# Patient Record
Sex: Female | Born: 1935 | Race: White | Hispanic: No | Smoking: Never smoker
Health system: Southern US, Community
[De-identification: ages and names within clinical notes are randomized; demographics above are authoritative.]

## PROBLEM LIST (undated history)

## (undated) DIAGNOSIS — Z8781 Personal history of (healed) traumatic fracture: Secondary | ICD-10-CM

## (undated) DIAGNOSIS — Z8489 Family history of other specified conditions: Secondary | ICD-10-CM

## (undated) DIAGNOSIS — D649 Anemia, unspecified: Secondary | ICD-10-CM

## (undated) DIAGNOSIS — Z5189 Encounter for other specified aftercare: Secondary | ICD-10-CM

## (undated) DIAGNOSIS — K219 Gastro-esophageal reflux disease without esophagitis: Secondary | ICD-10-CM

## (undated) DIAGNOSIS — G4700 Insomnia, unspecified: Secondary | ICD-10-CM

## (undated) DIAGNOSIS — D126 Benign neoplasm of colon, unspecified: Secondary | ICD-10-CM

## (undated) DIAGNOSIS — R12 Heartburn: Secondary | ICD-10-CM

## (undated) DIAGNOSIS — E785 Hyperlipidemia, unspecified: Secondary | ICD-10-CM

## (undated) DIAGNOSIS — E7800 Pure hypercholesterolemia, unspecified: Secondary | ICD-10-CM

## (undated) DIAGNOSIS — J449 Chronic obstructive pulmonary disease, unspecified: Secondary | ICD-10-CM

## (undated) DIAGNOSIS — F419 Anxiety disorder, unspecified: Secondary | ICD-10-CM

## (undated) DIAGNOSIS — M1990 Unspecified osteoarthritis, unspecified site: Secondary | ICD-10-CM

## (undated) DIAGNOSIS — I10 Essential (primary) hypertension: Secondary | ICD-10-CM

## (undated) DIAGNOSIS — L719 Rosacea, unspecified: Secondary | ICD-10-CM

## (undated) DIAGNOSIS — K5900 Constipation, unspecified: Secondary | ICD-10-CM

## (undated) DIAGNOSIS — R7611 Nonspecific reaction to tuberculin skin test without active tuberculosis: Secondary | ICD-10-CM

## (undated) DIAGNOSIS — Z8619 Personal history of other infectious and parasitic diseases: Secondary | ICD-10-CM

## (undated) DIAGNOSIS — K635 Polyp of colon: Secondary | ICD-10-CM

## (undated) HISTORY — DX: Polyp of colon: K63.5

## (undated) HISTORY — PX: JOINT REPLACEMENT: SHX530

## (undated) HISTORY — DX: Personal history of other infectious and parasitic diseases: Z86.19

## (undated) HISTORY — PX: LUMBAR FUSION: SHX111

## (undated) HISTORY — DX: Hyperlipidemia, unspecified: E78.5

## (undated) HISTORY — PX: WRIST SURGERY: SHX841

## (undated) HISTORY — DX: Encounter for other specified aftercare: Z51.89

## (undated) HISTORY — PX: COLONOSCOPY: SHX174

## (undated) HISTORY — DX: Essential (primary) hypertension: I10

## (undated) HISTORY — DX: Nonspecific reaction to tuberculin skin test without active tuberculosis: R76.11

## (undated) HISTORY — PX: HIP ARTHROPLASTY: SHX981

## (undated) HISTORY — PX: FRACTURE SURGERY: SHX138

## (undated) HISTORY — PX: VERTEBROPLASTY: SHX113

## (undated) HISTORY — DX: Rosacea, unspecified: L71.9

## (undated) HISTORY — PX: TONSILLECTOMY: SUR1361

## (undated) HISTORY — PX: REPLACEMENT TOTAL KNEE BILATERAL: SUR1225

## (undated) HISTORY — PX: POLYPECTOMY: SHX149

## (undated) HISTORY — DX: Benign neoplasm of colon, unspecified: D12.6

---
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
## 2006-12-18 DIAGNOSIS — D126 Benign neoplasm of colon, unspecified: Secondary | ICD-10-CM

## 2006-12-18 HISTORY — DX: Benign neoplasm of colon, unspecified: D12.6

## 2009-02-25 LAB — HM MAMMOGRAPHY: HM Mammogram: NORMAL

## 2010-06-06 ENCOUNTER — Inpatient Hospital Stay (HOSPITAL_COMMUNITY): Admission: RE | Admit: 2010-06-06 | Discharge: 2010-06-08 | Payer: Self-pay | Admitting: Orthopedic Surgery

## 2010-06-11 ENCOUNTER — Emergency Department (HOSPITAL_COMMUNITY): Admission: EM | Admit: 2010-06-11 | Discharge: 2010-06-11 | Payer: Self-pay | Admitting: Emergency Medicine

## 2010-06-11 ENCOUNTER — Emergency Department (HOSPITAL_COMMUNITY): Admission: EM | Admit: 2010-06-11 | Discharge: 2010-06-11 | Payer: Self-pay | Admitting: Family Medicine

## 2010-06-17 DEATH — deceased

## 2011-03-05 LAB — CBC
HCT: 26 % — ABNORMAL LOW (ref 36.0–46.0)
HCT: 30.5 % — ABNORMAL LOW (ref 36.0–46.0)
Hemoglobin: 10.3 g/dL — ABNORMAL LOW (ref 12.0–15.0)
MCHC: 33.8 g/dL (ref 30.0–36.0)
MCHC: 34 g/dL (ref 30.0–36.0)
MCV: 95.9 fL (ref 78.0–100.0)
MCV: 96.4 fL (ref 78.0–100.0)
Platelets: 190 10*3/uL (ref 150–400)
RBC: 3.18 MIL/uL — ABNORMAL LOW (ref 3.87–5.11)
RDW: 13.8 % (ref 11.5–15.5)

## 2011-03-05 LAB — URINE MICROSCOPIC-ADD ON

## 2011-03-05 LAB — URINALYSIS, ROUTINE W REFLEX MICROSCOPIC
Bilirubin Urine: NEGATIVE
Nitrite: NEGATIVE
Protein, ur: NEGATIVE mg/dL
Specific Gravity, Urine: 1.007 (ref 1.005–1.030)
pH: 6.5 (ref 5.0–8.0)

## 2011-03-05 LAB — POCT URINALYSIS DIP (DEVICE)
Ketones, ur: 40 mg/dL — AB
Protein, ur: NEGATIVE mg/dL
Urobilinogen, UA: 0.2 mg/dL (ref 0.0–1.0)
pH: 7.5 (ref 5.0–8.0)

## 2011-03-05 LAB — BASIC METABOLIC PANEL
CO2: 29 mEq/L (ref 19–32)
Calcium: 8.7 mg/dL (ref 8.4–10.5)
Chloride: 103 mEq/L (ref 96–112)
Creatinine, Ser: 0.65 mg/dL (ref 0.4–1.2)
GFR calc Af Amer: >60 mL/min (ref >60)
Sodium: 136 mEq/L (ref 135–145)
Sodium: 137 mEq/L (ref 135–145)

## 2011-03-06 LAB — APTT: aPTT: 28 seconds (ref 24–37)

## 2011-03-06 LAB — DIFFERENTIAL
Eosinophils Relative: 6 % — ABNORMAL HIGH (ref 0–5)
Lymphocytes Relative: 23 % (ref 12–46)
Lymphs Abs: 2.2 10*3/uL (ref 0.7–4.0)
Monocytes Absolute: 1 10*3/uL (ref 0.1–1.0)
Neutro Abs: 5.6 10*3/uL (ref 1.7–7.7)

## 2011-03-06 LAB — PROTIME-INR: Prothrombin Time: 13.7 seconds (ref 11.6–15.2)

## 2011-03-06 LAB — COMPREHENSIVE METABOLIC PANEL
Albumin: 4.3 g/dL (ref 3.5–5.2)
BUN: 16 mg/dL (ref 6–23)
Calcium: 9.6 mg/dL (ref 8.4–10.5)
Creatinine, Ser: 0.78 mg/dL (ref 0.4–1.2)
GFR calc Af Amer: >60 mL/min (ref >60)
GFR calc non Af Amer: >60 mL/min (ref >60)
Potassium: 3.6 mEq/L (ref 3.5–5.1)
Total Bilirubin: 0.6 mg/dL (ref 0.3–1.2)
Total Protein: 7 g/dL (ref 6.0–8.3)

## 2011-03-06 LAB — URINALYSIS, ROUTINE W REFLEX MICROSCOPIC
Bilirubin Urine: NEGATIVE
Glucose, UA: NEGATIVE mg/dL
Protein, ur: NEGATIVE mg/dL
Specific Gravity, Urine: 1.02 (ref 1.005–1.030)
Urobilinogen, UA: 0.2 mg/dL (ref 0.0–1.0)

## 2011-03-06 LAB — URINE CULTURE: Culture: NO GROWTH

## 2011-03-06 LAB — CROSSMATCH: ABO/RH(D): O POS

## 2011-03-06 LAB — CBC
HCT: 40 % (ref 36.0–46.0)
MCV: 96.4 fL (ref 78.0–100.0)
Platelets: 272 10*3/uL (ref 150–400)
WBC: 9.3 10*3/uL (ref 4.0–10.5)

## 2011-03-19 DIAGNOSIS — IMO0001 Reserved for inherently not codable concepts without codable children: Secondary | ICD-10-CM

## 2011-03-19 DIAGNOSIS — Z5189 Encounter for other specified aftercare: Secondary | ICD-10-CM

## 2011-03-19 HISTORY — DX: Encounter for other specified aftercare: Z51.89

## 2011-03-19 HISTORY — DX: Reserved for inherently not codable concepts without codable children: IMO0001

## 2011-04-06 ENCOUNTER — Other Ambulatory Visit (HOSPITAL_COMMUNITY): Payer: Self-pay | Admitting: Orthopedic Surgery

## 2011-04-06 ENCOUNTER — Ambulatory Visit (HOSPITAL_COMMUNITY)
Admission: RE | Admit: 2011-04-06 | Discharge: 2011-04-06 | Disposition: A | Discharge status: Home / Self Care | Payer: Medicare Other | Source: Ambulatory Visit | Attending: Orthopedic Surgery | Admitting: Orthopedic Surgery

## 2011-04-06 ENCOUNTER — Encounter (HOSPITAL_COMMUNITY)
Admission: RE | Admit: 2011-04-06 | Discharge: 2011-04-06 | Disposition: A | Discharge status: Home / Self Care | Payer: Medicare Other | Source: Ambulatory Visit | Attending: Orthopedic Surgery | Admitting: Orthopedic Surgery

## 2011-04-06 DIAGNOSIS — M1711 Unilateral primary osteoarthritis, right knee: Secondary | ICD-10-CM

## 2011-04-06 DIAGNOSIS — Z01818 Encounter for other preprocedural examination: Secondary | ICD-10-CM | POA: Insufficient documentation

## 2011-04-06 DIAGNOSIS — IMO0002 Reserved for concepts with insufficient information to code with codable children: Secondary | ICD-10-CM | POA: Insufficient documentation

## 2011-04-06 DIAGNOSIS — M1710 Unilateral primary osteoarthritis, unspecified knee: Secondary | ICD-10-CM | POA: Insufficient documentation

## 2011-04-06 DIAGNOSIS — Z01810 Encounter for preprocedural cardiovascular examination: Secondary | ICD-10-CM | POA: Insufficient documentation

## 2011-04-06 DIAGNOSIS — Z01812 Encounter for preprocedural laboratory examination: Secondary | ICD-10-CM | POA: Insufficient documentation

## 2011-04-06 LAB — URINALYSIS, ROUTINE W REFLEX MICROSCOPIC
Glucose, UA: NEGATIVE mg/dL
Ketones, ur: NEGATIVE mg/dL
Protein, ur: NEGATIVE mg/dL
Urobilinogen, UA: 0.2 mg/dL (ref 0.0–1.0)

## 2011-04-06 LAB — COMPREHENSIVE METABOLIC PANEL
Albumin: 4.4 g/dL (ref 3.5–5.2)
Alkaline Phosphatase: 68 U/L (ref 39–117)
BUN: 10 mg/dL (ref 6–23)
Calcium: 10 mg/dL (ref 8.4–10.5)
Creatinine, Ser: 0.74 mg/dL (ref 0.4–1.2)
GFR calc Af Amer: >60 mL/min (ref >60)
GFR calc non Af Amer: >60 mL/min (ref >60)
Total Protein: 7.4 g/dL (ref 6.0–8.3)

## 2011-04-06 LAB — CBC
Hemoglobin: 12.6 g/dL (ref 12.0–15.0)
MCH: 31.8 pg (ref 26.0–34.0)
MCHC: 33 g/dL (ref 30.0–36.0)
RDW: 13.6 % (ref 11.5–15.5)
WBC: 8.5 10*3/uL (ref 4.0–10.5)

## 2011-04-06 LAB — SURGICAL PCR SCREEN: MRSA, PCR: NEGATIVE

## 2011-04-07 LAB — URINE CULTURE
Colony Count: NO GROWTH
Culture  Setup Time: 201204191200

## 2011-04-17 ENCOUNTER — Inpatient Hospital Stay (HOSPITAL_COMMUNITY)
Admission: RE | Admit: 2011-04-17 | Discharge: 2011-04-20 | DRG: 470 | Disposition: A | Discharge status: Skilled Nursing Facility | Payer: Medicare Other | Source: Ambulatory Visit | Attending: Orthopedic Surgery | Admitting: Orthopedic Surgery

## 2011-04-17 DIAGNOSIS — D62 Acute posthemorrhagic anemia: Secondary | ICD-10-CM | POA: Diagnosis not present

## 2011-04-17 DIAGNOSIS — N390 Urinary tract infection, site not specified: Secondary | ICD-10-CM | POA: Diagnosis not present

## 2011-04-17 DIAGNOSIS — E7800 Pure hypercholesterolemia, unspecified: Secondary | ICD-10-CM | POA: Diagnosis present

## 2011-04-17 DIAGNOSIS — Z96659 Presence of unspecified artificial knee joint: Secondary | ICD-10-CM

## 2011-04-17 DIAGNOSIS — M1710 Unilateral primary osteoarthritis, unspecified knee: Principal | ICD-10-CM | POA: Diagnosis present

## 2011-04-17 DIAGNOSIS — R339 Retention of urine, unspecified: Secondary | ICD-10-CM | POA: Diagnosis not present

## 2011-04-17 DIAGNOSIS — K5900 Constipation, unspecified: Secondary | ICD-10-CM | POA: Diagnosis not present

## 2011-04-17 DIAGNOSIS — I10 Essential (primary) hypertension: Secondary | ICD-10-CM | POA: Diagnosis present

## 2011-04-17 DIAGNOSIS — IMO0002 Reserved for concepts with insufficient information to code with codable children: Principal | ICD-10-CM | POA: Diagnosis present

## 2011-04-18 LAB — BASIC METABOLIC PANEL
Calcium: 8.6 mg/dL (ref 8.4–10.5)
GFR calc Af Amer: >60 mL/min (ref >60)
GFR calc non Af Amer: >60 mL/min (ref >60)
Potassium: 3.3 mEq/L — ABNORMAL LOW (ref 3.5–5.1)
Sodium: 136 mEq/L (ref 135–145)

## 2011-04-18 LAB — CBC
HCT: 26.8 % — ABNORMAL LOW (ref 36.0–46.0)
MCHC: 33.6 g/dL (ref 30.0–36.0)
Platelets: 222 10*3/uL (ref 150–400)
RDW: 13.4 % (ref 11.5–15.5)
WBC: 11.5 10*3/uL — ABNORMAL HIGH (ref 4.0–10.5)

## 2011-04-19 LAB — BASIC METABOLIC PANEL
Chloride: 98 mEq/L (ref 96–112)
Creatinine, Ser: 0.6 mg/dL (ref 0.4–1.2)
GFR calc Af Amer: >60 mL/min (ref >60)
Sodium: 133 mEq/L — ABNORMAL LOW (ref 135–145)

## 2011-04-19 LAB — URINALYSIS, ROUTINE W REFLEX MICROSCOPIC
Bilirubin Urine: NEGATIVE
Nitrite: NEGATIVE
Specific Gravity, Urine: 1.012 (ref 1.005–1.030)
Urobilinogen, UA: 0.2 mg/dL (ref 0.0–1.0)
pH: 6 (ref 5.0–8.0)

## 2011-04-19 LAB — PREPARE RBC (CROSSMATCH)

## 2011-04-19 LAB — CBC
Hemoglobin: 7.3 g/dL — ABNORMAL LOW (ref 12.0–15.0)
MCH: 32.4 pg (ref 26.0–34.0)
Platelets: 202 10*3/uL (ref 150–400)
RBC: 2.25 MIL/uL — ABNORMAL LOW (ref 3.87–5.11)
WBC: 15.5 10*3/uL — ABNORMAL HIGH (ref 4.0–10.5)

## 2011-04-20 LAB — TYPE AND SCREEN
ABO/RH(D): O POS
Unit division: 0

## 2011-04-20 LAB — CBC
HCT: 26.5 % — ABNORMAL LOW (ref 36.0–46.0)
Platelets: 170 10*3/uL (ref 150–400)
RBC: 2.91 MIL/uL — ABNORMAL LOW (ref 3.87–5.11)
RDW: 16.1 % — ABNORMAL HIGH (ref 11.5–15.5)
WBC: 11.8 10*3/uL — ABNORMAL HIGH (ref 4.0–10.5)

## 2011-04-20 LAB — BASIC METABOLIC PANEL
Chloride: 102 mEq/L (ref 96–112)
Glucose, Bld: 112 mg/dL — ABNORMAL HIGH (ref 70–99)
Potassium: 3.2 mEq/L — ABNORMAL LOW (ref 3.5–5.1)
Sodium: 137 mEq/L (ref 135–145)

## 2011-04-26 NOTE — Op Note (Signed)
NAMESKYLAN, Savannah Coleman              ACCOUNT NO.:  [id number]  MEDICAL RECORD NO.:  [id number]           PATIENT TYPE:  I  LOCATION:  5030                         FACILITY:  [facility]  PHYSICIAN:  Mila Homer. Sherlean Foot, M.D. DATE OF BIRTH:  24-Feb-1931  DATE OF PROCEDURE:  04/17/2011 DATE OF DISCHARGE:                              OPERATIVE REPORT   SURGEON:  Mila Homer. Sherlean Foot, MD  ASSISTANT:  Altamese Cabal, PA-C  ANESTHESIA:  General.  PREOPERATIVE DIAGNOSIS:  Right knee osteoarthritis.  POSTOPERATIVE DIAGNOSIS:  Right knee osteoarthritis.  PROCEDURE:  Right total knee arthroplasty.  INDICATIONS FOR PROCEDURE:  The patient is a 77 year old white female with failure of conservative measures for osteoarthritis of the right knee.  Informed consent was obtained.  DESCRIPTION OF PROCEDURE:  The patient was laid supine and administered general anesthesia.  Right leg was prepped and draped in the usual sterile fashion.  The extremity was exsanguinated with the Esmarch tourniquet and inflated to 350 mmHg and set for an hour.  A midline incision was made with a #10 blade.  A new blade was used to make a median parapatellar arthrotomy and perform synovectomy.  Elevated the deep MCL off the medial crest of the tibia, being careful not to go too far distally since this was a valgus knee.  I everted the patella.  It was very worn and only at about 18 mm.  I decided to ream down to 12 mm and drilled through lug holes through the 32-mm template.  This gave me a 20-mm thickness.  I removed the prosthetic trial and went into flexion subluxing the patella laterally.  I used extramedullary system on the tibia to make perpendicular cut and 3 degrees of posterior slope.  I then used intramedullary system on the femur to make a 4-degree valgus cut since this was a valgus knee.  I then marked out the epicondylar axis and measured 3 degrees.  I sized to size E, pinned to 3 degrees external rotation holes,  and made anterior, posterior, and chamfer cuts through the formal cutting block.  I then placed a lamina spreader in the knee and removed the ACL, PCL, and medial/lateral menisci.  I did have to do some lateral pie crusting to balance the knee.  I then placed a 10-mm spacer block and the knee had good flexion/extension gap balance.  I then finished the femur with a size E finishing block, finished the tibia with a size 4 tibial tray drilling keel.  I converted to a gender specific femur having to recut little bit off the posterior femur and this afforded excellent sizing.  I then removed the trial components and copiously irrigated.  I then cemented a E gender specific femur, 4 tibia, 10 insert, 32 patella had good flexion/extension gap balance.  Tracking was excellent.  I then removed all excess cement and allow the cement to harden in extension.  I left a Hemovac coming out superolaterally and deep arthrotomy pain catheter coming out supermedial and superficial to the arthrotomy.  I then let tourniquet down to obtain hemostasis.  I then closed the arthrotomy with figure-of-eight #1  Vicryl sutures, buried 0 Vicryl sutures, subcuticular 3-0 Vicryl stitch, and skin staples.  COMPLICATIONS:  None.  DRAINS:  One Hemovac and one pain catheter.  ESTIMATED BLOOD LOSS:  300 mL.  TOURNIQUET TIME:  46 minutes.          ______________________________ Mila Homer Sherlean Foot, M.D.     SDL/MEDQ  D:  04/17/2011  T:  04/17/2011  Job:  [id number]  Electronically Signed by Georgena Spurling M.D. on 04/26/2011 07:36:22 AM

## 2011-05-02 NOTE — Consult Note (Signed)
  NAMEGWENDALYNN, Savannah Coleman              ACCOUNT NO.:  [id number]  MEDICAL RECORD NO.:  [id number]           PATIENT TYPE:  I  LOCATION:  5030                         FACILITY:  [facility]  PHYSICIAN:  Shenique Childers I. Patsi Sears, M.D.DATE OF BIRTH:  08/19/1931  DATE OF CONSULTATION:  04/19/2011 DATE OF DISCHARGE:                                CONSULTATION   SUBJECTIVE:  Savannah Coleman is a 77 year old female, status post right knee arthroplasty on April 17, 2011, with postop urinary retention treated with intermittent cath.  The patient is status post left knee arthroplasty 1 year ago, with the same problem.  At that time, she was found to have a urinary tract infection, with Morganella, which was treated with Cipro.  In addition, the patient had severe constipation, which was relieved by enemas, and MiraLax.  After resolving that constipation, she voided normally.  Currently, the patient began MiraLax 10 days prior to her surgery, but has developed acute urinary retention postoperatively, as well as constipation.  She has had no BM since prior to surgery.  MEDICATIONS: 1. Amlodipine. 2. Simvastatin. 3. Acetaminophen. 4. Lovenox. 5. MiraLax. 6. Oxycodone. 7. Hydromorphone. 8. Zofran. 9. Acetaminophen. 10.Robaxin.  FAMILY HISTORY:  Significant for arthritis.  SOCIAL HISTORY:  The patient is single.  She has excellent family support.  She drinks 1-2 cups of coffee per day.  ALLERGIES:  CODEINE.  REVIEW OF SYSTEMS:  Noncontributory except for inability to void, and constipation with some nausea and continued pain.  PHYSICAL EXAMINATION:  GENERAL:  A well-developed, well-nourished white female in no acute distress. VITAL SIGNS:  Blood pressure is 142/84, pulse 72, respiratory rate 20. NECK:  Supple, nontender, no nodes. ABDOMEN:  Soft, decreased bowel sounds without organomegaly or masses. BUS is normal.  IMPRESSION:  Urinary retention postoperative knee replacement.  The patient has  associated chronic constipation.  Would advise I and O cath until the patient is able to have bowel movement.  Would also advise aggressive bowel regimen with enemas, and Fleet's Phospho-Soda p.o.  We will add Flomax 0.4 mg one p.o. per day to try to relax the bladder outlet so that she can void.  I believe that she will void normally when her constipation is resolved.     Yachet Mattson I. Patsi Sears, M.D.     SIT/MEDQ  D:  04/19/2011  T:  04/20/2011  Job:  [id number]  cc:   Mila Homer. Sherlean Foot, M.D.  Electronically Signed by Jethro Bolus M.D. on 05/02/2011 05:10:23 PM

## 2011-05-11 NOTE — Discharge Summary (Signed)
Savannah Coleman, Savannah Coleman              ACCOUNT NO.:  [id number]  MEDICAL RECORD NO.:  [id number]           PATIENT TYPE:  I  LOCATION:  5030                         FACILITY:  [facility]  PHYSICIAN:  Mila Homer. Sherlean Foot, M.D. DATE OF BIRTH:  [birth year]  DATE OF ADMISSION:  04/17/2011 DATE OF DISCHARGE:  04/20/2011                              DISCHARGE SUMMARY   Savannah Coleman is an inpatient at [facility], room 530.  ADMISSION DIAGNOSIS:  Osteoarthritis of the right knee.  DISCHARGE DIAGNOSES: 1. Osteoarthritis of the right knee. 2. Status post right total knee arthroplasty. 3. Acute blood loss anemia status post surgery. 4. Constipation status post surgery.  PROCEDURE:  Right total knee arthroplasty.  HISTORY:  The patient is a 77 year old female complaining of pain in the right knee times several years, now interfering with activities of daily living.  Conservative treatments were tried and failed.  We reviewedrisks and benefits of surgery, and the patient agreed to have a right total knee arthroplasty.  ALLERGIES:  The patient is allergic to VICODIN, states constipation.  ADMISSION MEDICATIONS:  Upon admission to the hospital, the patient was taking: 1. Amlodipine 5 mg one tablet daily. 2. Multivitamins, over the counter, one daily. 3. Simvastatin 20 mg one tablet daily at bedtime.  HOSPITAL COURSE:  This is a 77 year old female admitted on April 17, 2011, after appropriate laboratory studies were obtained preoperatively as well as Ancef on-call to the operating room.  She was taken to the OR where she underwent a right total knee arthroplasty.  The patient tolerated the procedure well and taken to the PACU in good condition. She was placed on Dilaudid IV medication as needed as well as oxycodone 5 mg for pain.  Foley was placed intraoperatively.  On postop day 1, vital signs were stable.  The patient denied chest pain, shortness of breath, or calf pain.  The patient was  started on Lovenox 30 mg subcu q.12 h. at 8:00 a.m.  Consults with PT, OT, and care management were made.  The patient was weightbearing as tolerated.  CPM was 0-90 degrees 6-8 hours per day, incentive spirometry were taught.  Postop day #2, the patient continued to progress with physical therapy. Foley had been discontinued, and the patient last night had 700 mL of urine which was in and out cath and she did continue to go sparingly after that about 50-25 mL at a time.  The patient was also placed on MiraLax for constipation.  Marcaine pump and Hemovac were discontinued. On her last admission for her left total knee, Dr. Patsi Sears with [facility] had been consulted about constipation and UTI.  Dr. Patsi Sears was again consulted by me over the phone just for advise on what he would like to do.  We placed a Foley back in and sent her urine down for culture.  The patient had been treated with Ancef after surgery but to cover UTI since she has a history and we gave her Cipro 500 mg b.i.d. and she was started on Flomax 0.4 as well.  The patient also did have a hemoglobin of 7.7 and was given  2 units of PRBC.  Postop day 3, the patient's blood was much better at 9.2.  After her 2 units, she looked and felt much better.  The patient's Foley was discontinued.  She was continued on her antibiotic as well as her Flomax.  She was discharged to a skilled nursing facility at [facility] on postop day 3 after Lovenox teaching and physical therapy goals were met.  LABORATORY STUDIES:  Upon discharge from the hospital, the patient's vital signs were steady.  She was on room air with sats of 97.  The patient's H and H were 9.2 and 26.5, white blood cell count was 11.8 and platelets were 170.  The patient's sodium was 137, potassium was 3.2, chloride was 102, CO2 was 31, BUN was 11, and creatinine was 0.47, glucose was 112.  DISCHARGE INSTRUCTIONS:  There are no restrictions on diet.  She is  to follow the discharge instruction sheet that was provided and her diet was as you were doing prior to hospitalization.  Increase activity slowly as tolerated.  No lifting or driving for 6 weeks.  The patient may shower without dressing once there is no drainage and wash over the wound.  If drainage remains, cover wound with plastic wrap and then shower.  She was weightbearing as tolerated, stockings are to be used, the TED hose for 3 weeks on both legs, and you may remove them at night for sleeping.  Constipation prevention:  The patient was drinking plenty of fluids, prune juice may be helpful.  You may use a stool softener and may continue use of MiraLax for constipation as needed.  The patient is to follow up.  She is to call for an appointment to be seen on May 02, 2011, at 8:30 in the morning in [facility], the number is [phone].  The patient may change dressing on Friday, then change the dressing daily with sterile 4x4 gauze dressing, and apply the TED hose.  CPM is to be used 0-90 degrees 6-8 hours a day x2 weeks.  The patient is to continue Lovenox for 11 days as well.  Upon discharge from the hospital, the patient was given prescriptions for Robaxin 500 mg one to two tablets every 6-8 hours as needed for spasm, oxycodone 500 mg one to two tablets every 4-6 hours as needed for pain, Cipro 500 mg one b.i.d. for 3 days, Flomax 0.4 mg one daily at night x1 week, and Lovenox 40 mg one injection daily x11 days.  The patient is to follow up with Dr. Sherlean Foot on May 02, 2011, call for appointment at [phone].  The patient is discharged in improved condition.    ______________________________ Altamese Cabal, PA-C   ______________________________ Mila Homer. Sherlean Foot, M.D.   MJ/MEDQ  D:  04/20/2011  T:  04/20/2011  Job:  [id number]  Electronically Signed by Altamese Cabal PA-C on 05/09/2011 11:34:28 AM Electronically Signed by Georgena Spurling M.D. on 05/11/2011 10:39:49 PM

## 2012-03-22 ENCOUNTER — Encounter: Payer: Self-pay | Admitting: Internal Medicine

## 2012-04-23 ENCOUNTER — Encounter: Payer: Self-pay | Admitting: Internal Medicine

## 2012-04-23 ENCOUNTER — Ambulatory Visit (AMBULATORY_SURGERY_CENTER): Payer: Medicare Other | Admitting: *Deleted

## 2012-04-23 ENCOUNTER — Telehealth: Payer: Self-pay | Admitting: *Deleted

## 2012-04-23 VITALS — Ht 64.0 in | Wt 138.0 lb

## 2012-04-23 DIAGNOSIS — Z1211 Encounter for screening for malignant neoplasm of colon: Secondary | ICD-10-CM

## 2012-04-23 MED ORDER — PEG-KCL-NACL-NASULF-NA ASC-C 100 G PO SOLR: ORAL | Status: DC

## 2012-04-23 NOTE — Telephone Encounter (Signed)
Savannah Coleman had a colon at Dr. Truett Perna [facility] office in [place], [place] in 2008.  She said she had 3 Polyps and was to return for a recall colon in 3 years.  She is scheduled for a colon 05/07/12 and I completed her previsit and informed her Dr. Juanda Chance would need to review her colon report and path report and decide if it was time for her to have a colon.  I gave the Medical Release Form to Ronny Bacon CMA.  Wyona Almas

## 2012-04-23 NOTE — Progress Notes (Signed)
Ms. Diantonio had a colon at Dr. Sherrill [facility] office in [place], [place] in 2008.  She said she had 3 Polyps and was to return for a recall colon in 3 years.  She is scheduled for a colon 05/07/12 and I completed her previsit and informed her Dr. Brodie would need to review her colon report and path report and decide if it was time for her to have a colon.  I gave the Medical Release Form to Dottie Smith CMA.  JoAnn Jabar Krysiak  

## 2012-04-23 NOTE — Telephone Encounter (Signed)
Noted  

## 2012-05-07 ENCOUNTER — Ambulatory Visit (AMBULATORY_SURGERY_CENTER): Payer: Medicare Other | Admitting: Internal Medicine

## 2012-05-07 ENCOUNTER — Encounter: Payer: Self-pay | Admitting: Internal Medicine

## 2012-05-07 VITALS — BP 147/91 | HR 71 | Temp 97.6°F | Resp 18 | Ht 64.0 in | Wt 138.0 lb

## 2012-05-07 DIAGNOSIS — D126 Benign neoplasm of colon, unspecified: Secondary | ICD-10-CM

## 2012-05-07 DIAGNOSIS — Z800 Family history of malignant neoplasm of digestive organs: Secondary | ICD-10-CM

## 2012-05-07 DIAGNOSIS — Z86010 Personal history of colonic polyps: Secondary | ICD-10-CM

## 2012-05-07 DIAGNOSIS — Z1211 Encounter for screening for malignant neoplasm of colon: Secondary | ICD-10-CM

## 2012-05-07 MED ORDER — SODIUM CHLORIDE 0.9 % IV SOLN: 500.0000 mL | INTRAVENOUS | Status: DC

## 2012-05-07 NOTE — Progress Notes (Signed)
Patient did not experience any of the following events: a burn prior to discharge; a fall within the facility; wrong site/side/patient/procedure/implant event; or a hospital transfer or hospital admission upon discharge from the facility. (G8907) Patient did not have preoperative order for IV antibiotic SSI prophylaxis. (G8918)  

## 2012-05-07 NOTE — Op Note (Signed)
[facility] [address] [place], [place]  [address]  COLONOSCOPY PROCEDURE REPORT  PATIENT:  Savannah, Coleman  MR#:  [id number] BIRTHDATE:  04-01-1931, 80 yrs. old  GENDER:  female ENDOSCOPIST:  Hedwig Morton. Juanda Chance, MD REF. BY:  Birdena Jubilee, M.D. PROCEDURE DATE:  05/07/2012 PROCEDURE:  Colonoscopy with biopsy and snare polypectomy ASA CLASS:  Class II INDICATIONS:  family history of colon cancer, history of pre-cancerous (adenomatous) colon polyps 2008- adenom. polyp MEDICATIONS:   MAC sedation, administered by CRNA, propofol (Diprivan) 250 mg  DESCRIPTION OF PROCEDURE:   After the risks and benefits and of the procedure were explained, informed consent was obtained. Digital rectal exam was performed and revealed no rectal masses. The LB [id number]-[id number] endoscope was introduced through the anus and advanced to the cecum, which was identified by both the appendix and ileocecal valve.  The quality of the prep was good, using MoviPrep.  The instrument was then slowly withdrawn as the colon was fully examined. <<PROCEDUREIMAGES>>  FINDINGS:  Three polyps were found. at 120 cm 6 mm polyp, at 90 cm 8 mm and 6 mm sessile polyps The polyp was removed using cold biopsy forceps. Polyp was snared, then cauterized with monopolar cautery. Retrieval was successful. snare polyp Polyp was snared without cautery. Retrieval was successful (see image2, image7, image8, image9, and image10). snare polyp  Mild diverticulosis was found in the sigmoid colon (see image1).  This was otherwise a normal examination of the colon (see image3, image4, image6, image5, image13, and image12). inverted diverticulum   Retroflexed views in the rectum revealed no abnormalities.    The scope was then withdrawn from the patient and the procedure completed.  COMPLICATIONS:  None ENDOSCOPIC IMPRESSION: 1) Three polyps 2) Mild diverticulosis in the sigmoid colon 3) Otherwise normal  examination RECOMMENDATIONS: 1) Await pathology results 2) High fiber diet.  REPEAT EXAM:  In 5 year(s) for.  5 year recall only if medically stable  ______________________________ Hedwig Morton. Juanda Chance, MD  CC:  n. eSIGNED:   Hedwig Morton. Maximilian Tallo at 05/07/2012 11:39 AM  Jeannene Patella, [phone]

## 2012-05-07 NOTE — Patient Instructions (Signed)

## 2012-05-08 ENCOUNTER — Telehealth: Payer: Self-pay | Admitting: *Deleted

## 2012-05-08 NOTE — Telephone Encounter (Signed)
  Follow up Call-  Call back number 05/07/2012  Post procedure Call Back phone  # [phone]  Permission to leave phone message Yes     Patient questions:  Do you have a fever, pain , or abdominal swelling? no Pain Score  0 *  Have you tolerated food without any problems? yes  Have you been able to return to your normal activities? yes  Do you have any questions about your discharge instructions: Diet   no Medications  no Follow up visit  no  Do you have questions or concerns about your Care? no  Actions: * If pain score is 4 or above: No action needed, pain <4.

## 2012-05-14 ENCOUNTER — Encounter: Payer: Self-pay | Admitting: Internal Medicine

## 2013-02-13 ENCOUNTER — Encounter: Payer: Self-pay | Admitting: Family Medicine

## 2013-02-13 ENCOUNTER — Other Ambulatory Visit: Payer: Self-pay | Admitting: Dermatology

## 2013-02-13 ENCOUNTER — Encounter: Payer: Self-pay | Admitting: *Deleted

## 2013-02-13 DIAGNOSIS — D126 Benign neoplasm of colon, unspecified: Secondary | ICD-10-CM | POA: Insufficient documentation

## 2013-02-13 DIAGNOSIS — I10 Essential (primary) hypertension: Secondary | ICD-10-CM | POA: Insufficient documentation

## 2013-02-13 DIAGNOSIS — L719 Rosacea, unspecified: Secondary | ICD-10-CM | POA: Insufficient documentation

## 2013-02-13 DIAGNOSIS — E785 Hyperlipidemia, unspecified: Secondary | ICD-10-CM | POA: Insufficient documentation

## 2013-02-13 DIAGNOSIS — R7611 Nonspecific reaction to tuberculin skin test without active tuberculosis: Secondary | ICD-10-CM | POA: Insufficient documentation

## 2013-03-08 ENCOUNTER — Other Ambulatory Visit: Payer: Self-pay | Admitting: Family Medicine

## 2013-03-21 ENCOUNTER — Other Ambulatory Visit: Payer: Self-pay | Admitting: Family Medicine

## 2013-03-25 ENCOUNTER — Telehealth: Payer: Self-pay | Admitting: *Deleted

## 2013-03-25 NOTE — Telephone Encounter (Signed)
PT'S PHARMACY HAS TRIED TO SEND A REFILL REQUEST FOR PT . PT IS CALLING AND IS ALMOST COMPLETELY OUT OF RX HAS ONE PILL LEFT. SHE IS COMING IN FOR LABS AND APPT IN A FEW WEEKS.   SHE NEEDS REFILLS ON BOTH RX BLOOD PRESSURE AND CHOLESTEROL MED.   PH[facility]Y [facility] ON [address]  [place]. PLEASE CALL PT IF YOU HAVE ANY FURTHER QUEST.

## 2013-03-31 ENCOUNTER — Other Ambulatory Visit: Payer: Self-pay | Admitting: Family Medicine

## 2013-03-31 ENCOUNTER — Other Ambulatory Visit: Payer: Medicare Other | Admitting: *Deleted

## 2013-03-31 DIAGNOSIS — R5381 Other malaise: Secondary | ICD-10-CM

## 2013-03-31 DIAGNOSIS — I10 Essential (primary) hypertension: Secondary | ICD-10-CM

## 2013-03-31 LAB — COMPLETE METABOLIC PANEL WITH GFR
ALT: 16 U/L (ref 0–35)
AST: 28 U/L (ref 0–37)
Albumin: 4 g/dL (ref 3.5–5.2)
Alkaline Phosphatase: 51 U/L (ref 39–117)
BUN: 12 mg/dL (ref 6–23)
CO2: 29 mEq/L (ref 19–32)
Calcium: 9.7 mg/dL (ref 8.4–10.5)
Chloride: 105 mEq/L (ref 96–112)
Creat: 0.8 mg/dL (ref 0.50–1.10)
GFR, Est African American: 81 mL/min
GFR, Est Non African American: 70 mL/min
Glucose, Bld: 89 mg/dL (ref 70–99)
Potassium: 3.9 mEq/L (ref 3.5–5.3)
Sodium: 143 mEq/L (ref 135–145)
Total Bilirubin: 0.4 mg/dL (ref 0.3–1.2)
Total Protein: 6.7 g/dL (ref 6.0–8.3)

## 2013-03-31 LAB — TSH: TSH: 3.112 u[IU]/mL (ref 0.350–4.500)

## 2013-03-31 MED ORDER — AMLODIPINE BESYLATE 5 MG PO TABS: 5.0000 mg | ORAL_TABLET | Freq: Every day | ORAL | Status: DC

## 2013-03-31 MED ORDER — SIMVASTATIN 80 MG PO TABS: 40.0000 mg | ORAL_TABLET | Freq: Every day | ORAL | Status: DC

## 2013-03-31 NOTE — Telephone Encounter (Signed)
Refilled her norvasc and simvastatin for 30 days.  PT has a follow up for her lab results. PG

## 2013-04-01 ENCOUNTER — Encounter: Payer: Self-pay | Admitting: *Deleted

## 2013-04-01 LAB — MICROALBUMIN, URINE: Microalb, Ur: 1.22 mg/dL (ref 0.00–1.89)

## 2013-04-03 ENCOUNTER — Encounter: Payer: Self-pay | Admitting: Family Medicine

## 2013-04-03 ENCOUNTER — Ambulatory Visit (INDEPENDENT_AMBULATORY_CARE_PROVIDER_SITE_OTHER): Payer: Medicare Other | Admitting: Family Medicine

## 2013-04-03 VITALS — BP 150/84 | HR 67 | Ht 63.0 in | Wt 143.0 lb

## 2013-04-03 DIAGNOSIS — I10 Essential (primary) hypertension: Secondary | ICD-10-CM

## 2013-04-03 DIAGNOSIS — Z0000 Encounter for general adult medical examination without abnormal findings: Secondary | ICD-10-CM

## 2013-04-03 DIAGNOSIS — E785 Hyperlipidemia, unspecified: Secondary | ICD-10-CM

## 2013-04-03 LAB — POCT URINALYSIS DIPSTICK
Bilirubin, UA: NEGATIVE
Blood, UA: NEGATIVE
Glucose, UA: NEGATIVE
Ketones, UA: NEGATIVE
Leukocytes, UA: NEGATIVE
Nitrite, UA: NEGATIVE
Protein, UA: NEGATIVE
Spec Grav, UA: 1.02
Urobilinogen, UA: NEGATIVE
pH, UA: 6.5

## 2013-04-03 NOTE — Progress Notes (Signed)
Subjective:     Patient ID: Savannah Coleman, female   DOB: [birth year], 79 y.o.   MRN: [id number]  HPI Katori is here today for her annual CPE with lab results.  She has done well since her last office visit.    1)   Hypertension:  Her blood pressure is elevated today.  She monitors her blood pressure at home and says that it is much lower there.  She needs her amlodipine refilled.   2)  Hyperlipidemia:  She has done well on her simvastatin and needs a refill on it.   Review of Systems  Constitutional: Negative for activity change, fatigue and unexpected weight change.  HENT: Negative for ear pain, congestion, trouble swallowing, neck pain and neck stiffness.   Eyes: Negative for pain.  Respiratory: Negative for cough, chest tightness and shortness of breath.   Cardiovascular: Negative for chest pain and palpitations.  Gastrointestinal: Negative for abdominal pain, diarrhea and constipation.  Genitourinary: Negative for dysuria, frequency, vaginal pain and pelvic pain.  Musculoskeletal: Negative for myalgias, joint swelling and arthralgias.  Skin: Negative for rash.  Neurological: Negative for dizziness, weakness and light-headedness.  Psychiatric/Behavioral: Negative for sleep disturbance. The patient is not nervous/anxious.    Past Medical History  Diagnosis Date  . Hyperlipidemia   . Blood transfusion 03/2011  . Adenomatous colon polyp 2008  . Hypertension   . History of shingles   . Rosacea   . Colon polyps   . 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   Past Surgical History  Procedure Laterality Date  . Appendectomy  1958  . Cholecystectomy  1958  . Replacement total knee bilateral  2011 & 2012  . Colonoscopy    . Polypectomy     Family History  Problem Relation Age of Onset  . Colon cancer Neg Hx   . Stomach cancer Neg Hx   . COPD Father   . Asthma Father   . Asthma Sister        Objective:   Physical Exam  Constitutional: She appears well-nourished. No distress.  HENT:  Head: Normocephalic.  Mouth/Throat: Oropharynx is clear and moist.  Eyes: Conjunctivae and EOM are normal. Pupils are equal, round, and reactive to light. Right eye exhibits no discharge. Left eye exhibits no discharge. No scleral icterus.  Neck: Normal range of motion. Neck supple. No JVD present. No thyromegaly present.  Cardiovascular: Normal rate, regular rhythm and normal heart sounds.   Lymphadenopathy:    She has no cervical adenopathy.       Assessment:     Well Woman Exam  Hypertension Hyperlipidemia     Plan:     1)  We discussed preventative issues for her age group. She is up to date on her colonoscopy. She has had a Pneumovax and Tdap but does not want to get a Zostavax.    2)  Refilled her amlodipine and simvastatin.      3)  EKG showed some mild AV block and non-specific ST changes. She is totally asymptomatic for any cardiac issues.  We discussed setting her up with a cardiologist just because she has never had a cardiac evaluation. She would like to hold on this for now.   TIME 60 MINUTES:  MORE THAN 50 % OF TIME WAS INVOLVED IN COUNSELING.

## 2013-04-07 ENCOUNTER — Encounter: Payer: Self-pay | Admitting: *Deleted

## 2013-04-07 ENCOUNTER — Encounter: Payer: Self-pay | Admitting: Family Medicine

## 2013-04-07 DIAGNOSIS — E785 Hyperlipidemia, unspecified: Secondary | ICD-10-CM | POA: Insufficient documentation

## 2013-04-07 DIAGNOSIS — Z0000 Encounter for general adult medical examination without abnormal findings: Secondary | ICD-10-CM | POA: Insufficient documentation

## 2013-04-07 NOTE — Patient Instructions (Addendum)
Preventive Care for Adults, Female A healthy lifestyle and preventive care can promote health and wellness. Preventive health guidelines for women include the following key practices.  A routine yearly physical is a good way to check with your caregiver about your health and preventive screening. It is a chance to share any concerns and updates on your health, and to receive a thorough exam.  Visit your dentist for a routine exam and preventive care every 6 months. Brush your teeth twice a day and floss once a day. Good oral hygiene prevents tooth decay and gum disease.  The frequency of eye exams is based on your age, health, family medical history, use of contact lenses, and other factors. Follow your caregiver's recommendations for frequency of eye exams.  Eat a healthy diet. Foods like vegetables, fruits, whole grains, low-fat dairy products, and lean protein foods contain the nutrients you need without too many calories. Decrease your intake of foods high in solid fats, added sugars, and salt. Eat the right amount of calories for you.Get information about a proper diet from your caregiver, if necessary.  Regular physical exercise is one of the most important things you can do for your health. Most adults should get at least 150 minutes of moderate-intensity exercise (any activity that increases your heart rate and causes you to sweat) each week. In addition, most adults need muscle-strengthening exercises on 2 or more days a week.  Maintain a healthy weight. The body mass index (BMI) is a screening tool to identify possible weight problems. It provides an estimate of body fat based on height and weight. Your caregiver can help determine your BMI, and can help you achieve or maintain a healthy weight.For adults 20 years and older:  A BMI below 18.5 is considered underweight.  A BMI of 18.5 to 24.9 is normal.  A BMI of 25 to 29.9 is considered overweight.  A BMI of 30 and above is  considered obese.  Maintain normal blood lipids and cholesterol levels by exercising and minimizing your intake of saturated fat. Eat a balanced diet with plenty of fruit and vegetables. Blood tests for lipids and cholesterol should begin at age 20 and be repeated every 5 years. If your lipid or cholesterol levels are high, you are over 50, or you are at high risk for heart disease, you may need your cholesterol levels checked more frequently.Ongoing high lipid and cholesterol levels should be treated with medicines if diet and exercise are not effective.  If you smoke, find out from your caregiver how to quit. If you do not use tobacco, do not start.  If you are pregnant, do not drink alcohol. If you are breastfeeding, be very cautious about drinking alcohol. If you are not pregnant and choose to drink alcohol, do not exceed 1 drink per day. One drink is considered to be 12 ounces (355 mL) of beer, 5 ounces (148 mL) of wine, or 1.5 ounces (44 mL) of liquor.  Avoid use of street drugs. Do not share needles with anyone. Ask for help if you need support or instructions about stopping the use of drugs.  High blood pressure causes heart disease and increases the risk of stroke. Your blood pressure should be checked at least every 1 to 2 years. Ongoing high blood pressure should be treated with medicines if weight loss and exercise are not effective.  If you are 55 to 77 years old, ask your caregiver if you should take aspirin to prevent strokes.  Diabetes   screening involves taking a blood sample to check your fasting blood sugar level. This should be done once every 3 years, after age 45, if you are within normal weight and without risk factors for diabetes. Testing should be considered at a younger age or be carried out more frequently if you are overweight and have at least 1 risk factor for diabetes.  Breast cancer screening is essential preventive care for women. You should practice "breast  self-awareness." This means understanding the normal appearance and feel of your breasts and may include breast self-examination. Any changes detected, no matter how small, should be reported to a caregiver. Women in their 20s and 30s should have a clinical breast exam (CBE) by a caregiver as part of a regular health exam every 1 to 3 years. After age 40, women should have a CBE every year. Starting at age 40, women should consider having a mammography (breast X-ray test) every year. Women who have a family history of breast cancer should talk to their caregiver about genetic screening. Women at a high risk of breast cancer should talk to their caregivers about having magnetic resonance imaging (MRI) and a mammography every year.  The Pap test is a screening test for cervical cancer. A Pap test can show cell changes on the cervix that might become cervical cancer if left untreated. A Pap test is a procedure in which cells are obtained and examined from the lower end of the uterus (cervix).  Women should have a Pap test starting at age 21.  Between ages 21 and 29, Pap tests should be repeated every 2 years.  Beginning at age 30, you should have a Pap test every 3 years as long as the past 3 Pap tests have been normal.  Some women have medical problems that increase the chance of getting cervical cancer. Talk to your caregiver about these problems. It is especially important to talk to your caregiver if a new problem develops soon after your last Pap test. In these cases, your caregiver may recommend more frequent screening and Pap tests.  The above recommendations are the same for women who have or have not gotten the vaccine for human papillomavirus (HPV).  If you had a hysterectomy for a problem that was not cancer or a condition that could lead to cancer, then you no longer need Pap tests. Even if you no longer need a Pap test, a regular exam is a good idea to make sure no other problems are  starting.  If you are between ages 65 and 70, and you have had normal Pap tests going back 10 years, you no longer need Pap tests. Even if you no longer need a Pap test, a regular exam is a good idea to make sure no other problems are starting.  If you have had past treatment for cervical cancer or a condition that could lead to cancer, you need Pap tests and screening for cancer for at least 20 years after your treatment.  If Pap tests have been discontinued, risk factors (such as a new sexual partner) need to be reassessed to determine if screening should be resumed.  The HPV test is an additional test that may be used for cervical cancer screening. The HPV test looks for the virus that can cause the cell changes on the cervix. The cells collected during the Pap test can be tested for HPV. The HPV test could be used to screen women aged 30 years and older, and should   be used in women of any age who have unclear Pap test results. After the age of 30, women should have HPV testing at the same frequency as a Pap test.  Colorectal cancer can be detected and often prevented. Most routine colorectal cancer screening begins at the age of 50 and continues through age 75. However, your caregiver may recommend screening at an earlier age if you have risk factors for colon cancer. On a yearly basis, your caregiver may provide home test kits to check for hidden blood in the stool. Use of a small camera at the end of a tube, to directly examine the colon (sigmoidoscopy or colonoscopy), can detect the earliest forms of colorectal cancer. Talk to your caregiver about this at age 50, when routine screening begins. Direct examination of the colon should be repeated every 5 to 10 years through age 75, unless early forms of pre-cancerous polyps or small growths are found.  Hepatitis C blood testing is recommended for all people born from 1945 through 1965 and any individual with known risks for hepatitis C.  Practice  safe sex. Use condoms and avoid high-risk sexual practices to reduce the spread of sexually transmitted infections (STIs). STIs include gonorrhea, chlamydia, syphilis, trichomonas, herpes, HPV, and human immunodeficiency virus (HIV). Herpes, HIV, and HPV are viral illnesses that have no cure. They can result in disability, cancer, and death. Sexually active women aged 25 and younger should be checked for chlamydia. Older women with new or multiple partners should also be tested for chlamydia. Testing for other STIs is recommended if you are sexually active and at increased risk.  Osteoporosis is a disease in which the bones lose minerals and strength with aging. This can result in serious bone fractures. The risk of osteoporosis can be identified using a bone density scan. Women ages 65 and over and women at risk for fractures or osteoporosis should discuss screening with their caregivers. Ask your caregiver whether you should take a calcium supplement or vitamin D to reduce the rate of osteoporosis.  Menopause can be associated with physical symptoms and risks. Hormone replacement therapy is available to decrease symptoms and risks. You should talk to your caregiver about whether hormone replacement therapy is right for you.  Use sunscreen with sun protection factor (SPF) of 30 or more. Apply sunscreen liberally and repeatedly throughout the day. You should seek shade when your shadow is shorter than you. Protect yourself by wearing long sleeves, pants, a wide-brimmed hat, and sunglasses year round, whenever you are outdoors.  Once a month, do a whole body skin exam, using a mirror to look at the skin on your back. Notify your caregiver of new moles, moles that have irregular borders, moles that are larger than a pencil eraser, or moles that have changed in shape or color.  Stay current with required immunizations.  Influenza. You need a dose every fall (or winter). The composition of the flu vaccine  changes each year, so being vaccinated once is not enough.  Pneumococcal polysaccharide. You need 1 to 2 doses if you smoke cigarettes or if you have certain chronic medical conditions. You need 1 dose at age 65 (or older) if you have never been vaccinated.  Tetanus, diphtheria, pertussis (Tdap, Td). Get 1 dose of Tdap vaccine if you are younger than age 65, are over 65 and have contact with an infant, are a healthcare worker, are pregnant, or simply want to be protected from whooping cough. After that, you need a Td   booster dose every 10 years. Consult your caregiver if you have not had at least 3 tetanus and diphtheria-containing shots sometime in your life or have a deep or dirty wound.  HPV. You need this vaccine if you are a woman age 26 or younger. The vaccine is given in 3 doses over 6 months.  Measles, mumps, rubella (MMR). You need at least 1 dose of MMR if you were born in 1957 or later. You may also need a second dose.  Meningococcal. If you are age 19 to 21 and a first-year college student living in a residence hall, or have one of several medical conditions, you need to get vaccinated against meningococcal disease. You may also need additional booster doses.  Zoster (shingles). If you are age 60 or older, you should get this vaccine.  Varicella (chickenpox). If you have never had chickenpox or you were vaccinated but received only 1 dose, talk to your caregiver to find out if you need this vaccine.  Hepatitis A. You need this vaccine if you have a specific risk factor for hepatitis A virus infection or you simply wish to be protected from this disease. The vaccine is usually given as 2 doses, 6 to 18 months apart.  Hepatitis B. You need this vaccine if you have a specific risk factor for hepatitis B virus infection or you simply wish to be protected from this disease. The vaccine is given in 3 doses, usually over 6 months. Preventive Services / Frequency Ages 19 to 39  Blood  pressure check.** / Every 1 to 2 years.  Lipid and cholesterol check.** / Every 5 years beginning at age 20.  Clinical breast exam.** / Every 3 years for women in their 20s and 30s.  Pap test.** / Every 2 years from ages 21 through 29. Every 3 years starting at age 30 through age 65 or 70 with a history of 3 consecutive normal Pap tests.  HPV screening.** / Every 3 years from ages 30 through ages 65 to 70 with a history of 3 consecutive normal Pap tests.  Hepatitis C blood test.** / For any individual with known risks for hepatitis C.  Skin self-exam. / Monthly.  Influenza immunization.** / Every year.  Pneumococcal polysaccharide immunization.** / 1 to 2 doses if you smoke cigarettes or if you have certain chronic medical conditions.  Tetanus, diphtheria, pertussis (Tdap, Td) immunization. / A one-time dose of Tdap vaccine. After that, you need a Td booster dose every 10 years.  HPV immunization. / 3 doses over 6 months, if you are 26 and younger.  Measles, mumps, rubella (MMR) immunization. / You need at least 1 dose of MMR if you were born in 1957 or later. You may also need a second dose.  Meningococcal immunization. / 1 dose if you are age 19 to 21 and a first-year college student living in a residence hall, or have one of several medical conditions, you need to get vaccinated against meningococcal disease. You may also need additional booster doses.  Varicella immunization.** / Consult your caregiver.  Hepatitis A immunization.** / Consult your caregiver. 2 doses, 6 to 18 months apart.  Hepatitis B immunization.** / Consult your caregiver. 3 doses usually over 6 months. Ages 40 to 64  Blood pressure check.** / Every 1 to 2 years.  Lipid and cholesterol check.** / Every 5 years beginning at age 20.  Clinical breast exam.** / Every year after age 40.  Mammogram.** / Every year beginning at age 40   and continuing for as long as you are in good health. Consult with your  caregiver.  Pap test.** / Every 3 years starting at age 30 through age 65 or 70 with a history of 3 consecutive normal Pap tests.  HPV screening.** / Every 3 years from ages 30 through ages 65 to 70 with a history of 3 consecutive normal Pap tests.  Fecal occult blood test (FOBT) of stool. / Every year beginning at age 50 and continuing until age 75. You may not need to do this test if you get a colonoscopy every 10 years.  Flexible sigmoidoscopy or colonoscopy.** / Every 5 years for a flexible sigmoidoscopy or every 10 years for a colonoscopy beginning at age 50 and continuing until age 75.  Hepatitis C blood test.** / For all people born from 1945 through 1965 and any individual with known risks for hepatitis C.  Skin self-exam. / Monthly.  Influenza immunization.** / Every year.  Pneumococcal polysaccharide immunization.** / 1 to 2 doses if you smoke cigarettes or if you have certain chronic medical conditions.  Tetanus, diphtheria, pertussis (Tdap, Td) immunization.** / A one-time dose of Tdap vaccine. After that, you need a Td booster dose every 10 years.  Measles, mumps, rubella (MMR) immunization. / You need at least 1 dose of MMR if you were born in 1957 or later. You may also need a second dose.  Varicella immunization.** / Consult your caregiver.  Meningococcal immunization.** / Consult your caregiver.  Hepatitis A immunization.** / Consult your caregiver. 2 doses, 6 to 18 months apart.  Hepatitis B immunization.** / Consult your caregiver. 3 doses, usually over 6 months. Ages 65 and over  Blood pressure check.** / Every 1 to 2 years.  Lipid and cholesterol check.** / Every 5 years beginning at age 20.  Clinical breast exam.** / Every year after age 40.  Mammogram.** / Every year beginning at age 40 and continuing for as long as you are in good health. Consult with your caregiver.  Pap test.** / Every 3 years starting at age 30 through age 65 or 70 with a 3  consecutive normal Pap tests. Testing can be stopped between 65 and 70 with 3 consecutive normal Pap tests and no abnormal Pap or HPV tests in the past 10 years.  HPV screening.** / Every 3 years from ages 30 through ages 65 or 70 with a history of 3 consecutive normal Pap tests. Testing can be stopped between 65 and 70 with 3 consecutive normal Pap tests and no abnormal Pap or HPV tests in the past 10 years.  Fecal occult blood test (FOBT) of stool. / Every year beginning at age 50 and continuing until age 75. You may not need to do this test if you get a colonoscopy every 10 years.  Flexible sigmoidoscopy or colonoscopy.** / Every 5 years for a flexible sigmoidoscopy or every 10 years for a colonoscopy beginning at age 50 and continuing until age 75.  Hepatitis C blood test.** / For all people born from 1945 through 1965 and any individual with known risks for hepatitis C.  Osteoporosis screening.** / A one-time screening for women ages 65 and over and women at risk for fractures or osteoporosis.  Skin self-exam. / Monthly.  Influenza immunization.** / Every year.  Pneumococcal polysaccharide immunization.** / 1 dose at age 65 (or older) if you have never been vaccinated.  Tetanus, diphtheria, pertussis (Tdap, Td) immunization. / A one-time dose of Tdap vaccine if you are over   65 and have contact with an infant, are a healthcare worker, or simply want to be protected from whooping cough. After that, you need a Td booster dose every 10 years.  Varicella immunization.** / Consult your caregiver.  Meningococcal immunization.** / Consult your caregiver.  Hepatitis A immunization.** / Consult your caregiver. 2 doses, 6 to 18 months apart.  Hepatitis B immunization.** / Check with your caregiver. 3 doses, usually over 6 months. ** Family history and personal history of risk and conditions may change your caregiver's recommendations. Document Released: 01/30/2002 Document Revised: 02/26/2012  Document Reviewed: 05/01/2011 [employer] Patient Information 2013 [employer].  

## 2013-07-18 ENCOUNTER — Encounter: Payer: Self-pay | Admitting: *Deleted

## 2013-10-21 ENCOUNTER — Other Ambulatory Visit: Payer: Self-pay | Admitting: Family Medicine

## 2013-10-28 ENCOUNTER — Encounter: Payer: Self-pay | Admitting: Family Medicine

## 2013-10-28 ENCOUNTER — Ambulatory Visit (INDEPENDENT_AMBULATORY_CARE_PROVIDER_SITE_OTHER): Payer: Medicare Other | Admitting: Family Medicine

## 2013-10-28 VITALS — BP 139/76 | HR 63 | Resp 16 | Ht 63.0 in | Wt 145.0 lb

## 2013-10-28 DIAGNOSIS — E782 Mixed hyperlipidemia: Secondary | ICD-10-CM

## 2013-10-28 DIAGNOSIS — L309 Dermatitis, unspecified: Secondary | ICD-10-CM

## 2013-10-28 DIAGNOSIS — I10 Essential (primary) hypertension: Secondary | ICD-10-CM

## 2013-10-28 DIAGNOSIS — L259 Unspecified contact dermatitis, unspecified cause: Secondary | ICD-10-CM

## 2013-10-28 MED ORDER — TRIAMCINOLONE ACETONIDE 0.1 % EX CREA: 1.0000 "application " | TOPICAL_CREAM | Freq: Two times a day (BID) | CUTANEOUS | Status: DC

## 2013-10-28 MED ORDER — SIMVASTATIN 80 MG PO TABS: 80.0000 mg | ORAL_TABLET | Freq: Every day | ORAL | Status: DC

## 2013-10-28 MED ORDER — AMLODIPINE BESYLATE 5 MG PO TABS: 5.0000 mg | ORAL_TABLET | Freq: Every day | ORAL | Status: DC

## 2013-10-28 NOTE — Progress Notes (Signed)
  Subjective:    Patient ID: Savannah Coleman, female    DOB: [birth year], 79 y.o.   MRN: [id number]  HPI  Lorae is here today to discuss the conditions listed below.   1)  Hypertension: Her blood pressure is controlled with her Amlodipine (5 mg) daily.  She needs a refill on it.   2)  Hyperlipidemia:  She needs a refill on her simvastatin (80 mg).   3)  Skin Problem:  She has been noticing drypatches on her skin that appear overtime.  She has one patch located on her left abdomen that has been getting irritated.  She feels that this patch is itching and is has been changing on appearance in the past few week.  She has applied Eucerin cream which has not helped her much.     Review of Systems  Constitutional: Negative.   HENT: Negative.   Eyes: Negative.   Respiratory: Negative.   Cardiovascular: Negative.   Gastrointestinal: Negative.   Endocrine: Negative.   Genitourinary: Negative.   Musculoskeletal: Negative.   Skin: Positive for rash.  Allergic/Immunologic: Negative.   Neurological: Negative.   Hematological: Negative.   Psychiatric/Behavioral: Negative.      Past Medical History  Diagnosis Date  . Hyperlipidemia   . Blood transfusion 03/2011  . Adenomatous colon polyp 2008  . Hypertension   . History of shingles   . Rosacea   . Colon polyps   . 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    Family History  Problem Relation Age of Onset  . Colon cancer Neg Hx   . Stomach cancer Neg Hx   . COPD Father   . Asthma Father   . Asthma Sister     History   Social History Narrative   Marital Status: Divorced   Children:  G-4 P-4   Pets: None     Living Situation: Lives alone   Occupation: Part- Time Administrator, sports)   Education: Engineer, agricultural   Tobacco Use/Exposure:  She smoked 1 ppd for about 20 years.  She quit smoking 30 years ago.     Alcohol Use:  None   Drug Use:  None   Diet:   Regular- Weight Watchers   Exercise:  Walking, Pilates ([employer])    Hobbies: Knitting, Computer             Objective:   Physical Exam  Constitutional: She is oriented to person, place, and time. She appears well-developed and well-nourished.  Cardiovascular: Normal rate and regular rhythm.   Pulmonary/Chest: Effort normal and breath sounds normal.  Neurological: She is alert and oriented to person, place, and time.  Skin: Skin is warm and dry.  Psychiatric: She has a normal mood and affect.      Assessment & Plan:    Veverly was seen today for medication management.  Diagnoses and associated orders for this visit:  Essential hypertension, benign - amLODipine (NORVASC) 5 MG tablet; Take 1 tablet (5 mg total) by mouth daily.  Mixed hyperlipidemia - simvastatin (ZOCOR) 80 MG tablet; Take 1 tablet (80 mg total) by mouth at bedtime. Take 0.5 to 1 pill PO QD  Eczema - triamcinolone cream (KENALOG) 0.1 %; Apply 1 application topically 2 (two) times daily.   TIME SPENT "FACE TO FACE" WITH PATIENT -  30 MINS

## 2013-10-28 NOTE — Patient Instructions (Signed)
1)  BP- Continue on your current dosage.  2)  Cholesterol - Continue on your current dosage.  3)  Skin Lesion -  You appear to have an inflamed Seborrheic Keratosis on your left side.  Apply some Triamcinolone 2 times per day.  Schedule an appointment with Dr. Bridgette Habermann for a "Full Body Exam" and an evaluation of this spot.  I want to be sure that it is not an atypical squamous cell that needs to be removed.    Seborrheic Keratosis Seborrheic keratosis is a common, noncancerous (benign) skin growth that can occur anywhere on the skin.It looks like "stuck-on," waxy, rough, tan, brown, or black spots on the skin. These skin growths can be flat or raised.They are often called "barnacles" because of their pasted-on appearance.Usually, these skin growths appear in adulthood, around age 77, and increase in number as you age. They may also develop during pregnancy or following estrogen therapy. Many people may only have one growth appear in their lifetime, while some people may develop many growths. CAUSES It is unknown what causes these skin growths, but they appear to run in families. SYMPTOMS Seborrheic keratosis is often located on the face, chest, shoulders, back, or other areas. These growths are:  Usually painless, but may become irritated and itchy.  Yellow, brown, black, or other colors.  Slightly raised or have a flat surface.  Sometimes rough or wart-like in texture.  Often waxy on the surface.  Round or oval-shaped.  Sometimes "stuck-on" in appearance.  Sometimes single, but there are usually many growths. Any growth that bleeds, itches on a regular basis, becomes inflamed, or becomes irritated needs to be evaluated by a skin specialist (dermatologist). DIAGNOSIS Diagnosis is mainly based on the way the growths appear. In some cases, it can be difficult to tell this type of skin growth from skin cancer. A skin growth tissue sample (biopsy) may be used to confirm the  diagnosis. TREATMENT Most often, treatment is not needed because the skin growths are benign.If the skin growth is irritated easily by clothing or jewelry, causing it to scab or bleed, treatment may be recommended. Patients may also choose to have the growths removed because they do not like their appearance. Most commonly, these growths are treated with cryosurgery. In cryosurgery, liquid nitrogen is applied to "freeze" the growth. The growth usually falls off within a matter of days. A blister may form and dry into a scab that will also fall off. After the growth or scab falls off, it may leave a dark or light spot on the skin. This color may fade over time, or it may remain permanent on the skin. HOME CARE INSTRUCTIONS If the skin growths are treated with cryosurgery, the treated area needs to be kept clean with water and soap. SEEK MEDICAL CARE IF:  You have questions about these growths or other skin problems.  You develop new symptoms, including:  A change in the appearance of the skin growth.  New growths.  Any bleeding, itching, or pain in the growths.  A skin growth that looks similar to seborrheic keratosis. Document Released: 01/06/2011 Document Revised: 02/26/2012 Document Reviewed: 01/06/2011 [facility] Patient Information 2014 [place], [place].

## 2013-12-18 DIAGNOSIS — S7291XA Unspecified fracture of right femur, initial encounter for closed fracture: Secondary | ICD-10-CM

## 2013-12-18 HISTORY — DX: Unspecified fracture of right femur, initial encounter for closed fracture: S72.91XA

## 2013-12-23 ENCOUNTER — Encounter: Payer: Self-pay | Admitting: Family Medicine

## 2014-04-13 ENCOUNTER — Other Ambulatory Visit: Payer: Self-pay | Admitting: *Deleted

## 2014-04-13 DIAGNOSIS — R5383 Other fatigue: Secondary | ICD-10-CM

## 2014-04-13 DIAGNOSIS — R5381 Other malaise: Secondary | ICD-10-CM

## 2014-04-13 DIAGNOSIS — I10 Essential (primary) hypertension: Secondary | ICD-10-CM

## 2014-04-13 DIAGNOSIS — E785 Hyperlipidemia, unspecified: Secondary | ICD-10-CM

## 2014-04-16 ENCOUNTER — Other Ambulatory Visit: Payer: Medicare Other

## 2014-04-16 LAB — COMPLETE METABOLIC PANEL WITH GFR
ALT: 14 U/L (ref 0–35)
AST: 28 U/L (ref 0–37)
Albumin: 4.2 g/dL (ref 3.5–5.2)
Alkaline Phosphatase: 45 U/L (ref 39–117)
BUN: 12 mg/dL (ref 6–23)
CO2: 27 mEq/L (ref 19–32)
Calcium: 9.3 mg/dL (ref 8.4–10.5)
Chloride: 105 mEq/L (ref 96–112)
Creat: 0.72 mg/dL (ref 0.50–1.10)
GFR, Est African American: >89 mL/min
GFR, Est Non African American: 79 mL/min
Glucose, Bld: 83 mg/dL (ref 70–99)
Potassium: 3.4 mEq/L — ABNORMAL LOW (ref 3.5–5.3)
Sodium: 142 mEq/L (ref 135–145)
Total Bilirubin: 0.5 mg/dL (ref 0.2–1.2)
Total Protein: 6.5 g/dL (ref 6.0–8.3)

## 2014-04-16 LAB — CBC WITH DIFFERENTIAL/PLATELET
Basophils Absolute: 0.1 10*3/uL (ref 0.0–0.1)
Basophils Relative: 1 % (ref 0–1)
Eosinophils Absolute: 0.3 10*3/uL (ref 0.0–0.7)
Eosinophils Relative: 5 % (ref 0–5)
HCT: 36.7 % (ref 36.0–46.0)
Hemoglobin: 12.5 g/dL (ref 12.0–15.0)
Lymphocytes Relative: 29 % (ref 12–46)
Lymphs Abs: 1.7 10*3/uL (ref 0.7–4.0)
MCH: 32.6 pg (ref 26.0–34.0)
MCHC: 34.1 g/dL (ref 30.0–36.0)
MCV: 95.6 fL (ref 78.0–100.0)
Monocytes Absolute: 0.7 10*3/uL (ref 0.1–1.0)
Monocytes Relative: 12 % (ref 3–12)
Neutro Abs: 3 10*3/uL (ref 1.7–7.7)
Neutrophils Relative %: 53 % (ref 43–77)
Platelets: 302 10*3/uL (ref 150–400)
RBC: 3.84 MIL/uL — ABNORMAL LOW (ref 3.87–5.11)
RDW: 13.2 % (ref 11.5–15.5)
WBC: 5.7 10*3/uL (ref 4.0–10.5)

## 2014-04-16 LAB — LIPID PANEL
Cholesterol: 149 mg/dL (ref 0–200)
HDL: 66 mg/dL (ref >39)
LDL Cholesterol: 60 mg/dL (ref 0–99)
Total CHOL/HDL Ratio: 2.3 Ratio
Triglycerides: 114 mg/dL (ref <150)
VLDL: 23 mg/dL (ref 0–40)

## 2014-04-17 DIAGNOSIS — S5290XA Unspecified fracture of unspecified forearm, initial encounter for closed fracture: Secondary | ICD-10-CM

## 2014-04-17 HISTORY — DX: Unspecified fracture of unspecified forearm, initial encounter for closed fracture: S52.90XA

## 2014-04-17 LAB — MICROALBUMIN, URINE: Microalb, Ur: 1.35 mg/dL (ref 0.00–1.89)

## 2014-04-17 LAB — TSH: TSH: 4.336 u[IU]/mL (ref 0.350–4.500)

## 2014-04-23 ENCOUNTER — Encounter: Payer: Self-pay | Admitting: Family Medicine

## 2014-04-23 ENCOUNTER — Ambulatory Visit (INDEPENDENT_AMBULATORY_CARE_PROVIDER_SITE_OTHER): Payer: Medicare Other | Admitting: Family Medicine

## 2014-04-23 VITALS — BP 132/75 | HR 80 | Resp 16 | Ht 63.0 in | Wt 144.0 lb

## 2014-04-23 DIAGNOSIS — I10 Essential (primary) hypertension: Secondary | ICD-10-CM

## 2014-04-23 DIAGNOSIS — E785 Hyperlipidemia, unspecified: Secondary | ICD-10-CM

## 2014-04-23 MED ORDER — SIMVASTATIN 80 MG PO TABS: 80.0000 mg | ORAL_TABLET | Freq: Every day | ORAL | Status: DC

## 2014-04-23 MED ORDER — AMLODIPINE BESYLATE 5 MG PO TABS: 5.0000 mg | ORAL_TABLET | Freq: Every day | ORAL | Status: DC

## 2014-04-23 NOTE — Progress Notes (Signed)
Subjective:    Patient ID: Savannah Coleman, female    DOB: 30-Sep-1931, 80 y.o.   MRN: [id number]  HPI  Savannah Coleman is here to talk about her recent labs and the following conditions:  1)  Hypertension - She is taking amlodipine.  She is doing well on it and needs a refill.   2)  Hyperlipidemia - She is taking simvastatin.  She is doing well on it and needs a refill.   Review of Systems  Eyes: Negative for pain.  Respiratory: Negative for chest tightness.   Cardiovascular: Negative for chest pain, palpitations and leg swelling.  Skin: Negative for rash.  Neurological: Negative for dizziness, weakness and light-headedness.  Psychiatric/Behavioral: Negative for sleep disturbance. The patient is not nervous/anxious.   All other systems reviewed and are negative.    Past Medical History  Diagnosis Date  . Hyperlipidemia   . Blood transfusion 03/2011  . Adenomatous colon polyp 2008  . Hypertension   . History of shingles   . Rosacea   . Colon polyps   . 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  . Family history of anesthesia complication     " my son aspirated "  . GERD (gastroesophageal reflux disease)   . Arthritis     knees     Past Surgical History  Procedure Laterality Date  . Appendectomy  1958  . Cholecystectomy  1958  . Replacement total knee bilateral  2011 & 2012  . Colonoscopy    . Polypectomy    . Tonsillectomy    . Femur im nail Right 05/01/2014    Procedure: INTRAMEDULLARY (IM) RETROGRADE FEMORAL NAILING;  Surgeon: Johnny Bridge, MD;  Location: [facility];  Service: Orthopedics;  Laterality: Right;     History   Social History Narrative   Marital Status: Divorced   Children:  G-4 P-4   Pets: None     Living Situation: Lives alone   Occupation: Part- Time Data processing manager)   Education: Programmer, systems   Tobacco Use/Exposure:  She smoked 1 ppd for about 20 years.  She quit smoking  30 years ago.     Alcohol Use:  None   Drug Use:  None   Diet:  Regular- Weight Watchers   Exercise:  Walking, Pilates ([employer])    Hobbies: Knitting, Computer           Family History  Problem Relation Age of Onset  . Colon cancer Neg Hx   . Stomach cancer Neg Hx   . COPD Father   . Asthma Father   . Asthma Sister      Current Outpatient Prescriptions on File Prior to Visit  Medication Sig Dispense Refill  . Multiple Vitamins-Minerals (CENTRUM PO) Take 1 tablet by mouth daily.       No current facility-administered medications on file prior to visit.     No Known Allergies   Immunization History  Administered Date(s) Administered  . Influenza-Unspecified 09/17/2013       Objective:   Physical Exam  Nursing note and vitals reviewed. Constitutional: She is oriented to person, place, and time. She appears well-developed and well-nourished.  Cardiovascular: Normal rate and regular rhythm.   Pulmonary/Chest: Effort normal and breath sounds normal.  Neurological: She is alert and oriented to person, place, and time.  Skin: Skin is warm and dry.  Psychiatric: She has  a normal mood and affect.      Assessment & Plan:    Savannah Coleman was seen today for medication refill.  Diagnoses and associated orders for this visit:  Essential hypertension, benign - amLODipine (NORVASC) 5 MG tablet; Take 1 tablet (5 mg total) by mouth daily.  Other and unspecified hyperlipidemia  -     simvastatin (ZOCOR) 80 MG tablet; Take 1 tablet (80 mg total) by mouth at bedtime. Take 0.5 to 1 pill PO QD

## 2014-04-30 ENCOUNTER — Encounter (HOSPITAL_COMMUNITY): Payer: Self-pay | Admitting: Emergency Medicine

## 2014-04-30 ENCOUNTER — Emergency Department (HOSPITAL_COMMUNITY): Payer: Medicare Other

## 2014-04-30 ENCOUNTER — Inpatient Hospital Stay (HOSPITAL_COMMUNITY): Payer: Medicare Other

## 2014-04-30 ENCOUNTER — Inpatient Hospital Stay (HOSPITAL_COMMUNITY)
Admission: EM | Admit: 2014-04-30 | Discharge: 2014-05-04 | DRG: 481 | Disposition: A | Discharge status: Skilled Nursing Facility | Payer: Medicare Other | Attending: Orthopedic Surgery | Admitting: Orthopedic Surgery

## 2014-04-30 DIAGNOSIS — W19XXXA Unspecified fall, initial encounter: Secondary | ICD-10-CM | POA: Diagnosis present

## 2014-04-30 DIAGNOSIS — S72309A Unspecified fracture of shaft of unspecified femur, initial encounter for closed fracture: Principal | ICD-10-CM | POA: Diagnosis present

## 2014-04-30 DIAGNOSIS — M899 Disorder of bone, unspecified: Secondary | ICD-10-CM | POA: Diagnosis present

## 2014-04-30 DIAGNOSIS — M949 Disorder of cartilage, unspecified: Secondary | ICD-10-CM

## 2014-04-30 DIAGNOSIS — D62 Acute posthemorrhagic anemia: Secondary | ICD-10-CM | POA: Diagnosis not present

## 2014-04-30 DIAGNOSIS — S72453A Displaced supracondylar fracture without intracondylar extension of lower end of unspecified femur, initial encounter for closed fracture: Secondary | ICD-10-CM | POA: Diagnosis present

## 2014-04-30 DIAGNOSIS — I10 Essential (primary) hypertension: Secondary | ICD-10-CM | POA: Diagnosis present

## 2014-04-30 DIAGNOSIS — S7291XA Unspecified fracture of right femur, initial encounter for closed fracture: Secondary | ICD-10-CM | POA: Diagnosis present

## 2014-04-30 DIAGNOSIS — Z87891 Personal history of nicotine dependence: Secondary | ICD-10-CM

## 2014-04-30 DIAGNOSIS — Z96659 Presence of unspecified artificial knee joint: Secondary | ICD-10-CM

## 2014-04-30 DIAGNOSIS — Z79899 Other long term (current) drug therapy: Secondary | ICD-10-CM

## 2014-04-30 DIAGNOSIS — E785 Hyperlipidemia, unspecified: Secondary | ICD-10-CM | POA: Diagnosis present

## 2014-04-30 HISTORY — DX: Family history of other specified conditions: Z84.89

## 2014-04-30 HISTORY — DX: Unspecified osteoarthritis, unspecified site: M19.90

## 2014-04-30 HISTORY — DX: Gastro-esophageal reflux disease without esophagitis: K21.9

## 2014-04-30 LAB — CBC
HCT: 37.7 % (ref 36.0–46.0)
Hemoglobin: 12.6 g/dL (ref 12.0–15.0)
MCH: 32.6 pg (ref 26.0–34.0)
MCHC: 33.4 g/dL (ref 30.0–36.0)
MCV: 97.7 fL (ref 78.0–100.0)
PLATELETS: 273 10*3/uL (ref 150–400)
RBC: 3.86 MIL/uL — AB (ref 3.87–5.11)
RDW: 12.6 % (ref 11.5–15.5)
WBC: 15.8 10*3/uL — AB (ref 4.0–10.5)

## 2014-04-30 LAB — CBC WITH DIFFERENTIAL/PLATELET
Basophils Absolute: 0 10*3/uL (ref 0.0–0.1)
Basophils Relative: 0 % (ref 0–1)
EOS PCT: 2 % (ref 0–5)
Eosinophils Absolute: 0.2 10*3/uL (ref 0.0–0.7)
HCT: 38.4 % (ref 36.0–46.0)
Hemoglobin: 13 g/dL (ref 12.0–15.0)
LYMPHS ABS: 1.6 10*3/uL (ref 0.7–4.0)
LYMPHS PCT: 22 % (ref 12–46)
MCH: 32.7 pg (ref 26.0–34.0)
MCHC: 33.9 g/dL (ref 30.0–36.0)
MCV: 96.7 fL (ref 78.0–100.0)
MONOS PCT: 7 % (ref 3–12)
Monocytes Absolute: 0.6 10*3/uL (ref 0.1–1.0)
Neutro Abs: 5.2 10*3/uL (ref 1.7–7.7)
Neutrophils Relative %: 69 % (ref 43–77)
PLATELETS: 253 10*3/uL (ref 150–400)
RBC: 3.97 MIL/uL (ref 3.87–5.11)
RDW: 12.4 % (ref 11.5–15.5)
WBC: 7.6 10*3/uL (ref 4.0–10.5)

## 2014-04-30 LAB — URINALYSIS, ROUTINE W REFLEX MICROSCOPIC
BILIRUBIN URINE: NEGATIVE
Glucose, UA: NEGATIVE mg/dL
Hgb urine dipstick: NEGATIVE
Ketones, ur: 40 mg/dL — AB
Leukocytes, UA: NEGATIVE
NITRITE: NEGATIVE
PROTEIN: NEGATIVE mg/dL
Specific Gravity, Urine: 1.01 (ref 1.005–1.030)
UROBILINOGEN UA: 0.2 mg/dL (ref 0.0–1.0)
pH: 7.5 (ref 5.0–8.0)

## 2014-04-30 LAB — PROTIME-INR
INR: 0.95 (ref 0.00–1.49)
Prothrombin Time: 12.5 seconds (ref 11.6–15.2)

## 2014-04-30 LAB — BASIC METABOLIC PANEL
BUN: 12 mg/dL (ref 6–23)
BUN: 14 mg/dL (ref 6–23)
CALCIUM: 9.2 mg/dL (ref 8.4–10.5)
CO2: 23 meq/L (ref 19–32)
CO2: 26 meq/L (ref 19–32)
CREATININE: 0.69 mg/dL (ref 0.50–1.10)
Calcium: 9.4 mg/dL (ref 8.4–10.5)
Chloride: 102 mEq/L (ref 96–112)
Chloride: 98 mEq/L (ref 96–112)
Creatinine, Ser: 0.6 mg/dL (ref 0.50–1.10)
GFR calc Af Amer: >90 mL/min (ref >90)
GFR calc non Af Amer: 84 mL/min — ABNORMAL LOW (ref >90)
GFR calc non Af Amer: 80 mL/min — ABNORMAL LOW (ref >90)
GLUCOSE: 105 mg/dL — AB (ref 70–99)
Glucose, Bld: 146 mg/dL — ABNORMAL HIGH (ref 70–99)
POTASSIUM: 3.8 meq/L (ref 3.7–5.3)
Potassium: 3.7 mEq/L (ref 3.7–5.3)
SODIUM: 136 meq/L — AB (ref 137–147)
Sodium: 140 mEq/L (ref 137–147)

## 2014-04-30 LAB — TYPE AND SCREEN
ABO/RH(D): O POS
ANTIBODY SCREEN: NEGATIVE

## 2014-04-30 LAB — ABO/RH: ABO/RH(D): O POS

## 2014-04-30 MED ORDER — HYDROMORPHONE HCL PF 1 MG/ML IJ SOLN: 1.0000 mg | INTRAMUSCULAR | Status: DC | PRN

## 2014-04-30 MED ORDER — ATORVASTATIN CALCIUM 40 MG PO TABS
40.0000 mg | ORAL_TABLET | Freq: Every day | ORAL | Status: DC
  Filled 2014-04-30 (×5): qty 1

## 2014-04-30 MED ORDER — FENTANYL CITRATE 0.05 MG/ML IJ SOLN
50.0000 ug | INTRAMUSCULAR | Status: AC | PRN
  Administered 2014-04-30 (×2): 50 ug via INTRAVENOUS
  Filled 2014-04-30: qty 2

## 2014-04-30 MED ORDER — FENTANYL CITRATE 0.05 MG/ML IJ SOLN
50.0000 ug | INTRAMUSCULAR | Status: DC | PRN
  Filled 2014-04-30: qty 2

## 2014-04-30 MED ORDER — CALCIUM CARBONATE ANTACID 500 MG PO CHEW
1.0000 | CHEWABLE_TABLET | Freq: Two times a day (BID) | ORAL | Status: DC | PRN
  Administered 2014-04-30: 200 mg via ORAL
  Filled 2014-04-30 (×2): qty 1

## 2014-04-30 MED ORDER — MORPHINE SULFATE 2 MG/ML IJ SOLN: 1.0000 mg | INTRAMUSCULAR | Status: DC | PRN

## 2014-04-30 MED ORDER — AMLODIPINE BESYLATE 5 MG PO TABS: 5.0000 mg | ORAL_TABLET | Freq: Every day | ORAL | Status: DC

## 2014-04-30 MED ORDER — AMLODIPINE BESYLATE 5 MG PO TABS
5.0000 mg | ORAL_TABLET | Freq: Every day | ORAL | Status: DC
  Administered 2014-04-30 – 2014-05-03 (×3): 5 mg via ORAL
  Filled 2014-04-30 (×5): qty 1

## 2014-04-30 MED ORDER — HYDROMORPHONE HCL PF 1 MG/ML IJ SOLN
1.0000 mg | INTRAMUSCULAR | Status: DC | PRN
  Administered 2014-04-30 – 2014-05-01 (×2): 1 mg via INTRAVENOUS
  Filled 2014-04-30 (×2): qty 1

## 2014-04-30 MED ORDER — ONDANSETRON HCL 4 MG/2ML IJ SOLN
4.0000 mg | Freq: Once | INTRAMUSCULAR | Status: AC
  Administered 2014-04-30: 4 mg via INTRAVENOUS
  Filled 2014-04-30: qty 2

## 2014-04-30 MED ORDER — ONDANSETRON HCL 4 MG/2ML IJ SOLN
4.0000 mg | INTRAMUSCULAR | Status: DC | PRN
  Administered 2014-04-30: 4 mg via INTRAVENOUS
  Filled 2014-04-30: qty 2

## 2014-04-30 MED ORDER — HYDROCODONE-ACETAMINOPHEN 5-325 MG PO TABS
1.0000 | ORAL_TABLET | Freq: Four times a day (QID) | ORAL | Status: DC | PRN
  Administered 2014-04-30: 2 via ORAL
  Filled 2014-04-30: qty 2

## 2014-04-30 MED ORDER — POTASSIUM CHLORIDE IN NACL 20-0.45 MEQ/L-% IV SOLN
INTRAVENOUS | Status: DC
  Administered 2014-04-30 – 2014-05-01 (×2): via INTRAVENOUS
  Filled 2014-04-30 (×3): qty 1000

## 2014-04-30 MED ORDER — HYDROMORPHONE HCL PF 1 MG/ML IJ SOLN
1.0000 mg | Freq: Once | INTRAMUSCULAR | Status: AC
  Administered 2014-04-30: 1 mg via INTRAVENOUS
  Filled 2014-04-30: qty 1

## 2014-04-30 NOTE — H&P (Signed)
PREOPERATIVE H&P  Chief Complaint: right periprosthetic femur fracture  HPI: Savannah Coleman is a 80 y.o. female who had a mechanical fall today with acute onset severe right leg pain, unable to walk, denies loss of consciousness, no other injuries, history of right knee replacement done by Dr. Harden Mo. Denies bleeding at the injury.   Past Medical History  Diagnosis Date  . Hyperlipidemia   . Blood transfusion 03/2011  . Adenomatous colon polyp 2008  . Hypertension   . History of shingles   . Rosacea   . Colon polyps   . 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   Past Surgical History  Procedure Laterality Date  . Appendectomy  1958  . Cholecystectomy  1958  . Replacement total knee bilateral  2011 & 2012  . Colonoscopy    . Polypectomy     History   Social History  . Marital Status: Divorced    Spouse Name: N/A    Number of Children: 52  . Years of Education: 12   Occupational History  . Clerical  Other    Librarian, academic    Social History Main Topics  . Smoking status: Former Smoker    Types: Cigarettes  . Smokeless tobacco: Never Used  . Alcohol Use: No  . Drug Use: No  . Sexual Activity: Not Currently   Other Topics Concern  . None   Social History Narrative   Marital Status: Divorced   Children:  G-4 P-4   Pets: None     Living Situation: Lives alone   Occupation: Part- Time Data processing manager)   Education: Programmer, systems   Tobacco Use/Exposure:  She smoked 1 ppd for about 20 years.  She quit smoking 30 years ago.     Alcohol Use:  None   Drug Use:  None   Diet:  Regular- Weight Watchers   Exercise:  Walking, Pilates ([employer])    Hobbies: Knitting, Computer         Family History  Problem Relation Age of Onset  . Colon cancer Neg Hx   . Stomach cancer Neg Hx   . COPD Father   . Asthma Father   . Asthma Sister    No Known Allergies Prior to Admission  medications   Medication Sig Start Date End Date Taking? Authorizing Provider  amLODipine (NORVASC) 5 MG tablet Take 5 mg by mouth at bedtime.   Yes Historical Provider, MD  diphenhydramine-acetaminophen (TYLENOL PM) 25-500 MG TABS Take 2 tablets by mouth at bedtime as needed (for sleep).   Yes Historical Provider, MD  Multiple Vitamins-Minerals (CENTRUM PO) Take 1 tablet by mouth daily.   Yes Historical Provider, MD  simvastatin (ZOCOR) 80 MG tablet Take 40 mg by mouth at bedtime.   Yes Historical Provider, MD  tetrahydrozoline (VISINE) 0.05 % ophthalmic solution Place 1 drop into both eyes daily after breakfast.   Yes Historical Provider, MD     Positive ROS: All other systems have been reviewed and were otherwise negative with the exception of those mentioned in the HPI and as above.  Physical Exam: General: Alert, no acute distress Cardiovascular: No pedal edema Respiratory: No cyanosis, no use of accessory musculature GI: No organomegaly, abdomen is soft and non-tender Skin: No lesions in the area of chief complaint, with exception of bruising over the right leg. Neurologic: Sensation intact distally Psychiatric:  Patient is competent for consent with normal mood and affect Lymphatic: No axillary or cervical lymphadenopathy  MUSCULOSKELETAL: Right leg is shortened and mildly angulated. Sensation intact throughout the foot. EHL and FHL are firing. Positive pain to palpation over the right femur.  Assessment: right periprosthetic femur fracture  Plan: Plan for Procedure(s): INTRAMEDULLARY (IM) RETROGRADE FEMORAL NAILING  The risks benefits and alternatives were discussed with the patient including but not limited to the risks of nonoperative treatment, versus surgical intervention including infection, bleeding, nerve injury,  blood clots, cardiopulmonary complications, morbidity, mortality, among others, and they were willing to proceed. She will plan to be admitted overnight,  optimized from n.p.o. status, as well as obtain all of the standard preoperative medical studies, and plan for surgery tomorrow afternoon when scheduled OR time available.  Johnny Bridge, MD Cell [phone]   04/30/2014 6:09 PM

## 2014-04-30 NOTE — ED Notes (Signed)
Per EMS pt comes from home where she fell when trying to take the garbage out.  Pt does have crepitus of right knee.  Pt had 4mg  Zofran and 241mcg Fentanyl in route by EMS.  Pt denies taking blood thinners. Pt had prior bilat knee replacements.  Pt states that her ortho MD is Dr. Zenovia Jarred and doesn't want any one else.

## 2014-04-30 NOTE — ED Notes (Signed)
Bed: WA21 Expected date:  Expected time:  Means of arrival:  Comments: EMS-fall-deformity

## 2014-04-30 NOTE — ED Provider Notes (Signed)
CSN: [id number]     Arrival date & time 04/30/14  1311 History   First MD Initiated Contact with Patient 04/30/14 1320     Chief Complaint  Patient presents with  . Fall  . Knee Injury    right     (Consider location/radiation/quality/duration/timing/severity/associated sxs/prior Treatment) HPI Comments: Patient is an 80 year old female with history of hyperlipidemia, hypertension, colonic polyps who presents to the emergency department today after a fall. She reports that just prior to arrival she was taking out the trash to put in her car when she tripped over the trash can lid. She states that she had trashbags in both hands and was unable to catch her fall. She fell directly onto her right knee. She has been unable to ambulate since the fall. The pain is in her right knee. She is prior knee replacements in both knees by Dr. Ronnie Derby in 2011 and 2012. She did not hit her head or lose consciousness. She denies any other injury. She is not on any blood thinners. She states she ate 2 pieces of toast around 9 AM this morning.  Patient is a 80 y.o. female presenting with fall. The history is provided by the patient. No language interpreter was used.  Fall Associated symptoms include arthralgias, joint swelling and myalgias. Pertinent negatives include no abdominal pain, chest pain, chills, fever, headaches, nausea or vomiting.    Past Medical History  Diagnosis Date  . Hyperlipidemia   . Blood transfusion 03/2011  . Adenomatous colon polyp 2008  . Hypertension   . History of shingles   . Rosacea   . Colon polyps   . 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   Past Surgical History  Procedure Laterality Date  . Appendectomy  1958  . Cholecystectomy  1958  . Replacement total knee bilateral  2011 & 2012  . Colonoscopy    . Polypectomy     Family History  Problem Relation Age of Onset  . Colon cancer Neg Hx   . Stomach cancer  Neg Hx   . COPD Father   . Asthma Father   . Asthma Sister    History  Substance Use Topics  . Smoking status: Former Smoker    Types: Cigarettes  . Smokeless tobacco: Never Used  . Alcohol Use: No   OB History   Grav Para Term Preterm Abortions TAB SAB Ect Mult Living                 Review of Systems  Constitutional: Negative for fever and chills.  Respiratory: Negative for shortness of breath.   Cardiovascular: Negative for chest pain.  Gastrointestinal: Negative for nausea, vomiting and abdominal pain.  Musculoskeletal: Positive for arthralgias, gait problem, joint swelling and myalgias.  Neurological: Negative for syncope, light-headedness and headaches.  All other systems reviewed and are negative.     Allergies  Review of patient's allergies indicates no known allergies.  Home Medications   Prior to Admission medications   Medication Sig Start Date End Date Taking? Authorizing Provider  amLODipine (NORVASC) 5 MG tablet Take 5 mg by mouth at bedtime.   Yes Historical Provider, MD  diphenhydramine-acetaminophen (TYLENOL PM) 25-500 MG TABS Take 2 tablets by mouth at bedtime as needed (for sleep).   Yes Historical Provider, MD  Multiple Vitamins-Minerals (CENTRUM PO) Take 1 tablet by mouth daily.   Yes Historical Provider,  MD  simvastatin (ZOCOR) 80 MG tablet Take 40 mg by mouth at bedtime.   Yes Historical Provider, MD  tetrahydrozoline (VISINE) 0.05 % ophthalmic solution Place 1 drop into both eyes daily after breakfast.   Yes Historical Provider, MD   BP 178/104  Pulse 84  Temp(Src) 97.9 F (36.6 C) (Oral)  SpO2 98% Physical Exam  Nursing note and vitals reviewed. Constitutional: She is oriented to person, place, and time. She appears well-developed and well-nourished. No distress.  HENT:  Head: Normocephalic and atraumatic.  Right Ear: External ear normal.  Left Ear: External ear normal.  Nose: Nose normal.  Mouth/Throat: Oropharynx is clear and moist.   Eyes: Conjunctivae are normal.  Neck: Normal range of motion.  Cardiovascular: Normal rate, regular rhythm, normal heart sounds, intact distal pulses and normal pulses.   Pulses:      Dorsalis pedis pulses are 2+ on the right side, and 2+ on the left side.       Posterior tibial pulses are 2+ on the right side, and 2+ on the left side.  Capillary refill < 3 seconds in all toes.   Pulmonary/Chest: Effort normal and breath sounds normal. No stridor. No respiratory distress. She has no wheezes. She has no rales.  Abdominal: Soft. She exhibits no distension.  Musculoskeletal: Normal range of motion.  Obvious deformity to right distal femur. No tenderness to palpation over right hip. Compartment soft.   Neurological: She is alert and oriented to person, place, and time. She has normal strength. No sensory deficit.  Right leg neurovascularly intact. Sensation intact.   Skin: Skin is warm and dry. She is not diaphoretic. No erythema.  Psychiatric: She has a normal mood and affect. Her behavior is normal.    ED Course  Procedures (including critical care time) Labs Review Labs Reviewed  BASIC METABOLIC PANEL - Abnormal; Notable for the following:    Glucose, Bld 105 (*)    GFR calc non Af Amer 84 (*)    All other components within normal limits  URINALYSIS, ROUTINE W REFLEX MICROSCOPIC - Abnormal; Notable for the following:    Ketones, ur 40 (*)    All other components within normal limits  CBC - Abnormal; Notable for the following:    WBC 15.8 (*)    RBC 3.86 (*)    All other components within normal limits  BASIC METABOLIC PANEL - Abnormal; Notable for the following:    Sodium 136 (*)    Glucose, Bld 146 (*)    GFR calc non Af Amer 80 (*)    All other components within normal limits  CBC - Abnormal; Notable for the following:    WBC 14.7 (*)    RBC 2.93 (*)    Hemoglobin 9.7 (*)    HCT 28.7 (*)    All other components within normal limits  CREATININE, SERUM - Abnormal; Notable  for the following:    GFR calc non Af Amer 82 (*)    All other components within normal limits  CBC - Abnormal; Notable for the following:    WBC 15.7 (*)    RBC 2.54 (*)    Hemoglobin 8.4 (*)    HCT 25.0 (*)    All other components within normal limits  BASIC METABOLIC PANEL - Abnormal; Notable for the following:    Sodium 136 (*)    Glucose, Bld 119 (*)    GFR calc non Af Amer 79 (*)    All other components within normal  limits  SURGICAL PCR SCREEN  CBC WITH DIFFERENTIAL  PROTIME-INR  HEMOGLOBIN AND HEMATOCRIT, BLOOD  TYPE AND SCREEN  ABO/RH    Imaging Review Dg Hip Complete Right  04/30/2014   CLINICAL DATA:  FALL KNEE INJURY  EXAM: RIGHT HIP - COMPLETE 2+ VIEW  COMPARISON:  None.  FINDINGS: There is no evidence of hip fracture or dislocation. There is a periarticular sclerosis and hypertrophic spurring within the right hip. Degenerative changes are appreciated within the sacroiliac joints.  IMPRESSION: Osteoarthritic changes.  No acute osseous abnormalities.   Electronically Signed   By: Margaree Mackintosh M.D.   On: 04/30/2014 15:39   Dg Femur Right  05/01/2014   CLINICAL DATA:  Femur fracture  EXAM: DG C-ARM 61-120 MIN; RIGHT FEMUR - 2 VIEW  : COMPARISON:  04/30/2014  FINDINGS: A series of 6 fluoroscopic spot images document placement of an IM rod with 4distal and at least 1 proximal interlocking screws transfixing a transverse fracture of the distal femoral shaft in near anatomic alignment. Components of right knee arthroplasty are again noted.  IMPRESSION: 1. Internal fixation of femur fracture.   Electronically Signed   By: Arne Cleveland M.D.   On: 05/01/2014 16:55   Dg Femur Right  04/30/2014   CLINICAL DATA:  FALL KNEE INJURY  EXAM: RIGHT FEMUR - 2 VIEW  COMPARISON:  None.  FINDINGS: Transverse fracture within the distal femoral shaft with dorsal angulation and medial displacement. Fracture lines extend to the interface of the native distal femur and femoral component of the  knee arthroplasty.  IMPRESSION: Distal femoral shaft fracture.   Electronically Signed   By: Margaree Mackintosh M.D.   On: 04/30/2014 15:41   Dg Knee 1-2 Views Right  04/30/2014   CLINICAL DATA:  Fall.  EXAM: RIGHT KNEE - 1-2 VIEW  COMPARISON:  None available for comparison at time of study interpretation.  FINDINGS: Distal femur diaphyseal transverse fracture with posterolateral displacement of distal bony fragments and approximately 14 mm of overriding impacted bony fragments. Fracture is at least 5 cm above the distal femur hardware. Bone mineral density is decreased without destructive bony lesions.  Status post total knee arthroplasty, high-riding patella may be projectional. No periprosthetic lucency, soft tissue planes are nonsuspicious.  IMPRESSION: Distal femur displaced fracture without dislocation, status post total knee arthroplasty.   Electronically Signed   By: Elon Alas   On: 04/30/2014 15:39   Dg Chest Portable 1 View  04/30/2014   CLINICAL DATA:  Preoperative evaluation for 0 right leg surgery  EXAM: PORTABLE CHEST - 1 VIEW  COMPARISON:  04/06/2011  FINDINGS: Cardiac shadow is within normal limits. The lungs are well aerated with mild or left basilar scarring. No focal infiltrate is seen. A calcified granuloma is again noted in the left mid lung. No acute bony abnormality is seen.  IMPRESSION: Stable chronic changes no acute abnormality is noted.   Electronically Signed   By: Inez Catalina M.D.   On: 04/30/2014 21:18   Dg Femur Right Port  05/01/2014   CLINICAL DATA:  Intramedullary nail placement.  EXAM: PORTABLE RIGHT FEMUR - 2 VIEW  COMPARISON:  05/01/2014  FINDINGS: Frontal and lateral projections of the femur demonstrate a retrograde intramedullary nail with single proximal and 4 distal interlocking screws, traversing the distal femoral metadiaphyseal fracture. A total knee prosthesis is present. No complicating feature identified.  IMPRESSION: 1. Retrograde femoral IM nail  placement traversing the distal femoral fracture. No complicating feature observed.   Electronically  Signed   By: Sherryl Barters M.D.   On: 05/01/2014 18:48   Dg C-arm 61-120 Min  05/01/2014   CLINICAL DATA:  Femur fracture  EXAM: DG C-ARM 61-120 MIN; RIGHT FEMUR - 2 VIEW  : COMPARISON:  04/30/2014  FINDINGS: A series of 6 fluoroscopic spot images document placement of an IM rod with 4distal and at least 1 proximal interlocking screws transfixing a transverse fracture of the distal femoral shaft in near anatomic alignment. Components of right knee arthroplasty are again noted.  IMPRESSION: 1. Internal fixation of femur fracture.   Electronically Signed   By: Arne Cleveland M.D.   On: 05/01/2014 16:55     EKG Interpretation None      MDM   Final diagnoses:  Femur fracture, right   Patient presents to ED for right femur fracture. She sees Dr. Ronnie Derby as her orthopedic physician. Dr. Mardelle Matte is on call and will admit patient. She is to be transferred to [facility] Emergency Department so he can see her there. Likely surgery tomorrow. Discussed this with the patient who is agreeable to plan. Vital signs stable at this time. Discussed case with Dr. Wilson Singer who agrees with plan. Patient / Family / Caregiver informed of clinical course, understand medical decision-making process, and agree with plan.    Elwyn Lade, PA-C 05/02/14 [phone]

## 2014-05-01 ENCOUNTER — Inpatient Hospital Stay (HOSPITAL_COMMUNITY): Payer: Medicare Other

## 2014-05-01 ENCOUNTER — Encounter (HOSPITAL_COMMUNITY): Payer: Medicare Other | Admitting: Certified Registered"

## 2014-05-01 ENCOUNTER — Inpatient Hospital Stay (HOSPITAL_COMMUNITY): Payer: Medicare Other | Admitting: Certified Registered"

## 2014-05-01 ENCOUNTER — Encounter (HOSPITAL_COMMUNITY)
Admission: EM | Disposition: A | Discharge status: Skilled Nursing Facility | Payer: Self-pay | Source: Home / Self Care | Attending: Orthopedic Surgery

## 2014-05-01 ENCOUNTER — Encounter (HOSPITAL_COMMUNITY): Payer: Self-pay | Admitting: General Practice

## 2014-05-01 HISTORY — PX: FEMUR IM NAIL: SHX1597

## 2014-05-01 LAB — CBC
HCT: 28.7 % — ABNORMAL LOW (ref 36.0–46.0)
HEMOGLOBIN: 9.7 g/dL — AB (ref 12.0–15.0)
MCH: 33.1 pg (ref 26.0–34.0)
MCHC: 33.8 g/dL (ref 30.0–36.0)
MCV: 98 fL (ref 78.0–100.0)
PLATELETS: 201 10*3/uL (ref 150–400)
RBC: 2.93 MIL/uL — AB (ref 3.87–5.11)
RDW: 12.6 % (ref 11.5–15.5)
WBC: 14.7 10*3/uL — AB (ref 4.0–10.5)

## 2014-05-01 LAB — SURGICAL PCR SCREEN
MRSA, PCR: NEGATIVE
STAPHYLOCOCCUS AUREUS: NEGATIVE

## 2014-05-01 LAB — CREATININE, SERUM
Creatinine, Ser: 0.63 mg/dL (ref 0.50–1.10)
GFR, EST NON AFRICAN AMERICAN: 82 mL/min — AB (ref >90)

## 2014-05-01 LAB — HEMOGLOBIN AND HEMATOCRIT, BLOOD
HEMATOCRIT: 36.9 % (ref 36.0–46.0)
Hemoglobin: 12.3 g/dL (ref 12.0–15.0)

## 2014-05-01 SURGERY — INSERTION, INTRAMEDULLARY ROD, FEMUR, RETROGRADE
Anesthesia: General | Laterality: Right

## 2014-05-01 MED ORDER — ALUM & MAG HYDROXIDE-SIMETH 200-200-20 MG/5ML PO SUSP
30.0000 mL | ORAL | Status: DC | PRN
  Administered 2014-05-02 (×2): 30 mL via ORAL
  Filled 2014-05-01 (×2): qty 30

## 2014-05-01 MED ORDER — FENTANYL CITRATE 0.05 MG/ML IJ SOLN
25.0000 ug | INTRAMUSCULAR | Status: DC | PRN
  Administered 2014-05-01 (×2): 50 ug via INTRAVENOUS

## 2014-05-01 MED ORDER — LACTATED RINGERS IV SOLN
INTRAVENOUS | Status: DC | PRN
  Administered 2014-05-01: 15:00:00 via INTRAVENOUS

## 2014-05-01 MED ORDER — SUCCINYLCHOLINE CHLORIDE 20 MG/ML IJ SOLN
INTRAMUSCULAR | Status: DC | PRN
  Administered 2014-05-01: 100 mg via INTRAVENOUS

## 2014-05-01 MED ORDER — MAGNESIUM CITRATE PO SOLN: 1.0000 | Freq: Once | ORAL | Status: AC | PRN

## 2014-05-01 MED ORDER — SENNA 8.6 MG PO TABS
1.0000 | ORAL_TABLET | Freq: Two times a day (BID) | ORAL | Status: DC
  Administered 2014-05-01 – 2014-05-04 (×5): 8.6 mg via ORAL
  Filled 2014-05-01 (×7): qty 1

## 2014-05-01 MED ORDER — EPHEDRINE SULFATE 50 MG/ML IJ SOLN
INTRAMUSCULAR | Status: DC | PRN
  Administered 2014-05-01: 15 mg via INTRAVENOUS

## 2014-05-01 MED ORDER — LACTATED RINGERS IV SOLN
INTRAVENOUS | Status: DC
  Administered 2014-05-01: 14:00:00 via INTRAVENOUS

## 2014-05-01 MED ORDER — LACTATED RINGERS IV SOLN
INTRAVENOUS | Status: DC | PRN
  Administered 2014-05-01 (×2): via INTRAVENOUS

## 2014-05-01 MED ORDER — FENTANYL CITRATE 0.05 MG/ML IJ SOLN
INTRAMUSCULAR | Status: AC
  Filled 2014-05-01: qty 5

## 2014-05-01 MED ORDER — NEOSTIGMINE METHYLSULFATE 10 MG/10ML IV SOLN
INTRAVENOUS | Status: DC | PRN
  Administered 2014-05-01: 3 mg via INTRAVENOUS

## 2014-05-01 MED ORDER — CEFAZOLIN SODIUM-DEXTROSE 2-3 GM-% IV SOLR
2.0000 g | Freq: Four times a day (QID) | INTRAVENOUS | Status: AC
  Administered 2014-05-01 – 2014-05-02 (×2): 2 g via INTRAVENOUS
  Filled 2014-05-01 (×2): qty 50

## 2014-05-01 MED ORDER — PANTOPRAZOLE SODIUM 40 MG IV SOLR
40.0000 mg | Freq: Two times a day (BID) | INTRAVENOUS | Status: DC
  Administered 2014-05-01 – 2014-05-03 (×5): 40 mg via INTRAVENOUS
  Filled 2014-05-01 (×7): qty 40

## 2014-05-01 MED ORDER — DOCUSATE SODIUM 100 MG PO CAPS
100.0000 mg | ORAL_CAPSULE | Freq: Two times a day (BID) | ORAL | Status: DC
  Administered 2014-05-01 – 2014-05-04 (×4): 100 mg via ORAL
  Filled 2014-05-01 (×7): qty 1

## 2014-05-01 MED ORDER — ONDANSETRON HCL 4 MG/2ML IJ SOLN
4.0000 mg | Freq: Four times a day (QID) | INTRAMUSCULAR | Status: DC | PRN
  Filled 2014-05-01: qty 2

## 2014-05-01 MED ORDER — PHENOL 1.4 % MT LIQD
1.0000 | OROMUCOSAL | Status: DC | PRN
Start: 2014-05-01 — End: 2014-05-04

## 2014-05-01 MED ORDER — 0.9 % SODIUM CHLORIDE (POUR BTL) OPTIME
TOPICAL | Status: DC | PRN
  Administered 2014-05-01: 1000 mL

## 2014-05-01 MED ORDER — HYDROCODONE-ACETAMINOPHEN 5-325 MG PO TABS
1.0000 | ORAL_TABLET | Freq: Four times a day (QID) | ORAL | Status: DC | PRN
  Administered 2014-05-01 – 2014-05-02 (×4): 2 via ORAL
  Administered 2014-05-03: 1 via ORAL
  Administered 2014-05-04: 2 via ORAL
  Administered 2014-05-04: 1 via ORAL
  Filled 2014-05-01 (×3): qty 2
  Filled 2014-05-01: qty 1
  Filled 2014-05-01 (×2): qty 2
  Filled 2014-05-01: qty 1

## 2014-05-01 MED ORDER — GLYCOPYRROLATE 0.2 MG/ML IJ SOLN
INTRAMUSCULAR | Status: DC | PRN
  Administered 2014-05-01: 0.6 mg via INTRAVENOUS

## 2014-05-01 MED ORDER — PHENYLEPHRINE HCL 10 MG/ML IJ SOLN
INTRAMUSCULAR | Status: DC | PRN
  Administered 2014-05-01: 120 ug via INTRAVENOUS
  Administered 2014-05-01: 80 ug via INTRAVENOUS

## 2014-05-01 MED ORDER — ACETAMINOPHEN 650 MG RE SUPP: 650.0000 mg | Freq: Four times a day (QID) | RECTAL | Status: DC | PRN

## 2014-05-01 MED ORDER — MORPHINE SULFATE 2 MG/ML IJ SOLN
0.5000 mg | INTRAMUSCULAR | Status: DC | PRN
  Administered 2014-05-01 – 2014-05-02 (×2): 0.5 mg via INTRAVENOUS
  Filled 2014-05-01 (×2): qty 1

## 2014-05-01 MED ORDER — BISACODYL 10 MG RE SUPP
10.0000 mg | Freq: Every day | RECTAL | Status: DC | PRN
Start: 2014-05-01 — End: 2014-05-04

## 2014-05-01 MED ORDER — FENTANYL CITRATE 0.05 MG/ML IJ SOLN
INTRAMUSCULAR | Status: AC
  Filled 2014-05-01: qty 2

## 2014-05-01 MED ORDER — ONDANSETRON HCL 4 MG PO TABS: 4.0000 mg | ORAL_TABLET | Freq: Three times a day (TID) | ORAL | Status: DC | PRN

## 2014-05-01 MED ORDER — ARTIFICIAL TEARS OP OINT
TOPICAL_OINTMENT | OPHTHALMIC | Status: DC | PRN
  Administered 2014-05-01: 1 via OPHTHALMIC

## 2014-05-01 MED ORDER — ENOXAPARIN SODIUM 40 MG/0.4ML ~~LOC~~ SOLN: 40.0000 mg | SUBCUTANEOUS | Status: DC

## 2014-05-01 MED ORDER — POLYETHYLENE GLYCOL 3350 17 G PO PACK: 17.0000 g | PACK | Freq: Every day | ORAL | Status: DC | PRN

## 2014-05-01 MED ORDER — ONDANSETRON HCL 4 MG PO TABS: 4.0000 mg | ORAL_TABLET | Freq: Four times a day (QID) | ORAL | Status: DC | PRN

## 2014-05-01 MED ORDER — PROPOFOL 10 MG/ML IV BOLUS
INTRAVENOUS | Status: AC
  Filled 2014-05-01: qty 20

## 2014-05-01 MED ORDER — FENTANYL CITRATE 0.05 MG/ML IJ SOLN
INTRAMUSCULAR | Status: DC | PRN
  Administered 2014-05-01 (×3): 50 ug via INTRAVENOUS
  Administered 2014-05-01: 100 ug via INTRAVENOUS
  Administered 2014-05-01: 50 ug via INTRAVENOUS
  Administered 2014-05-01: 100 ug via INTRAVENOUS

## 2014-05-01 MED ORDER — LIDOCAINE HCL (CARDIAC) 20 MG/ML IV SOLN
INTRAVENOUS | Status: DC | PRN
  Administered 2014-05-01: 80 mg via INTRAVENOUS

## 2014-05-01 MED ORDER — ARTIFICIAL TEARS OP OINT
TOPICAL_OINTMENT | OPHTHALMIC | Status: AC
  Filled 2014-05-01: qty 3.5

## 2014-05-01 MED ORDER — LIDOCAINE HCL (CARDIAC) 20 MG/ML IV SOLN
INTRAVENOUS | Status: AC
  Filled 2014-05-01: qty 5

## 2014-05-01 MED ORDER — ROCURONIUM BROMIDE 100 MG/10ML IV SOLN
INTRAVENOUS | Status: DC | PRN
  Administered 2014-05-01 (×2): 10 mg via INTRAVENOUS
  Administered 2014-05-01: 30 mg via INTRAVENOUS

## 2014-05-01 MED ORDER — ONDANSETRON HCL 4 MG/2ML IJ SOLN
INTRAMUSCULAR | Status: DC | PRN
  Administered 2014-05-01: 4 mg via INTRAVENOUS

## 2014-05-01 MED ORDER — METOCLOPRAMIDE HCL 10 MG PO TABS: 5.0000 mg | ORAL_TABLET | Freq: Three times a day (TID) | ORAL | Status: DC | PRN

## 2014-05-01 MED ORDER — PLASMA PROTEIN FRACTION 5 % IV SOLN
INTRAVENOUS | Status: DC | PRN
  Administered 2014-05-01: 250 mL via INTRAVENOUS

## 2014-05-01 MED ORDER — HYDROCODONE-ACETAMINOPHEN 10-325 MG PO TABS: 1.0000 | ORAL_TABLET | Freq: Four times a day (QID) | ORAL | Status: DC | PRN

## 2014-05-01 MED ORDER — PROPOFOL 10 MG/ML IV BOLUS
INTRAVENOUS | Status: DC | PRN
  Administered 2014-05-01: 160 mg via INTRAVENOUS

## 2014-05-01 MED ORDER — EPHEDRINE SULFATE 50 MG/ML IJ SOLN
INTRAMUSCULAR | Status: AC
  Filled 2014-05-01: qty 1

## 2014-05-01 MED ORDER — CEFAZOLIN SODIUM-DEXTROSE 2-3 GM-% IV SOLR
2.0000 g | INTRAVENOUS | Status: AC
  Administered 2014-05-01: 2 g via INTRAVENOUS
  Filled 2014-05-01: qty 50

## 2014-05-01 MED ORDER — ENOXAPARIN SODIUM 40 MG/0.4ML ~~LOC~~ SOLN
40.0000 mg | SUBCUTANEOUS | Status: DC
  Administered 2014-05-02 – 2014-05-04 (×3): 40 mg via SUBCUTANEOUS
  Filled 2014-05-01 (×4): qty 0.4

## 2014-05-01 MED ORDER — METOCLOPRAMIDE HCL 5 MG/ML IJ SOLN: 5.0000 mg | Freq: Three times a day (TID) | INTRAMUSCULAR | Status: DC | PRN

## 2014-05-01 MED ORDER — ONDANSETRON HCL 4 MG/2ML IJ SOLN
4.0000 mg | Freq: Four times a day (QID) | INTRAMUSCULAR | Status: DC | PRN
  Administered 2014-05-01 (×2): 4 mg via INTRAVENOUS
  Filled 2014-05-01 (×2): qty 2

## 2014-05-01 MED ORDER — POTASSIUM CHLORIDE IN NACL 20-0.45 MEQ/L-% IV SOLN
INTRAVENOUS | Status: DC
  Administered 2014-05-01 – 2014-05-02 (×2): via INTRAVENOUS
  Filled 2014-05-01 (×7): qty 1000

## 2014-05-01 MED ORDER — ACETAMINOPHEN 325 MG PO TABS: 650.0000 mg | ORAL_TABLET | Freq: Four times a day (QID) | ORAL | Status: DC | PRN

## 2014-05-01 MED ORDER — MENTHOL 3 MG MT LOZG: 1.0000 | LOZENGE | OROMUCOSAL | Status: DC | PRN

## 2014-05-01 SURGICAL SUPPLY — 75 items
BANDAGE ELASTIC 4 VELCRO ST LF (GAUZE/BANDAGES/DRESSINGS) ×3 IMPLANT
BANDAGE ELASTIC 6 VELCRO ST LF (GAUZE/BANDAGES/DRESSINGS) ×3 IMPLANT
BANDAGE ESMARK 6X9 LF (GAUZE/BANDAGES/DRESSINGS) IMPLANT
BANDAGE GAUZE ELAST BULKY 4 IN (GAUZE/BANDAGES/DRESSINGS) ×3 IMPLANT
BENZOIN TINCTURE PRP APPL 2/3 (GAUZE/BANDAGES/DRESSINGS) ×3 IMPLANT
BIT DRILL CALIBRATED 4.3MMX365 (DRILL) ×1 IMPLANT
BIT DRILL CROWE PNT TWST 4.5MM (DRILL) ×1 IMPLANT
BLADE 10 SAFETY STRL DISP (BLADE) ×3 IMPLANT
BLADE SURG 15 STRL LF DISP TIS (BLADE) ×1 IMPLANT
BLADE SURG 15 STRL SS (BLADE) ×2
BLADE SURG ROTATE 9660 (MISCELLANEOUS) IMPLANT
BNDG COHESIVE 3X5 TAN STRL LF (GAUZE/BANDAGES/DRESSINGS) ×3 IMPLANT
BNDG COHESIVE 6X5 TAN STRL LF (GAUZE/BANDAGES/DRESSINGS) ×3 IMPLANT
BNDG ESMARK 6X9 LF (GAUZE/BANDAGES/DRESSINGS)
BOOTCOVER CLEANROOM LRG (PROTECTIVE WEAR) ×6 IMPLANT
CANISTER SUCT 3000ML (MISCELLANEOUS) ×3 IMPLANT
CLOSURE STERI-STRIP 1/2X4 (GAUZE/BANDAGES/DRESSINGS) ×1
CLSR STERI-STRIP ANTIMIC 1/2X4 (GAUZE/BANDAGES/DRESSINGS) ×2 IMPLANT
COVER SURGICAL LIGHT HANDLE (MISCELLANEOUS) ×3 IMPLANT
CUFF TOURNIQUET SINGLE 34IN LL (TOURNIQUET CUFF) IMPLANT
CUFF TOURNIQUET SINGLE 44IN (TOURNIQUET CUFF) IMPLANT
DRAPE C-ARM 42X72 X-RAY (DRAPES) ×3 IMPLANT
DRAPE C-ARMOR (DRAPES) ×3 IMPLANT
DRAPE IMP U-DRAPE 54X76 (DRAPES) ×3 IMPLANT
DRAPE INCISE IOBAN 66X45 STRL (DRAPES) ×3 IMPLANT
DRAPE ORTHO SPLIT 77X108 STRL (DRAPES) ×6
DRAPE PROXIMA HALF (DRAPES) ×6 IMPLANT
DRAPE SURG ORHT 6 SPLT 77X108 (DRAPES) ×3 IMPLANT
DRAPE U-SHAPE 47X51 STRL (DRAPES) ×3 IMPLANT
DRILL CALIBRATED 4.3MMX365 (DRILL) ×3
DRILL CROWE POINT TWIST 4.5MM (DRILL) ×3
DRSG ADAPTIC 3X8 NADH LF (GAUZE/BANDAGES/DRESSINGS) ×3 IMPLANT
DRSG MEPILEX BORDER 4X4 (GAUZE/BANDAGES/DRESSINGS) ×3 IMPLANT
DRSG PAD ABDOMINAL 8X10 ST (GAUZE/BANDAGES/DRESSINGS) ×3 IMPLANT
DURAPREP 26ML APPLICATOR (WOUND CARE) ×3 IMPLANT
ELECT REM PT RETURN 9FT ADLT (ELECTROSURGICAL) ×3
ELECTRODE REM PT RTRN 9FT ADLT (ELECTROSURGICAL) ×1 IMPLANT
FACESHIELD WRAPAROUND (MASK) IMPLANT
GLOVE BIOGEL PI ORTHO PRO SZ8 (GLOVE) ×4
GLOVE ECLIPSE 7.5 STRL STRAW (GLOVE) ×3 IMPLANT
GLOVE ORTHO TXT STRL SZ7.5 (GLOVE) ×3 IMPLANT
GLOVE PI ORTHO PRO STRL SZ8 (GLOVE) ×2 IMPLANT
GLOVE SURG ORTHO 8.0 STRL STRW (GLOVE) ×6 IMPLANT
GOWN STRL REUS W/ TWL LRG LVL3 (GOWN DISPOSABLE) IMPLANT
GOWN STRL REUS W/ TWL XL LVL3 (GOWN DISPOSABLE) ×2 IMPLANT
GOWN STRL REUS W/TWL 2XL LVL3 (GOWN DISPOSABLE) ×3 IMPLANT
GOWN STRL REUS W/TWL LRG LVL3 (GOWN DISPOSABLE)
GOWN STRL REUS W/TWL XL LVL3 (GOWN DISPOSABLE) ×4
GUIDEWIRE BEAD TIP (WIRE) ×3 IMPLANT
KIT BASIN OR (CUSTOM PROCEDURE TRAY) ×3 IMPLANT
KIT ROOM TURNOVER OR (KITS) ×3 IMPLANT
NAIL FEM RETRO 12X360 (Nail) ×3 IMPLANT
NS IRRIG 1000ML POUR BTL (IV SOLUTION) ×3 IMPLANT
PACK GENERAL/GYN (CUSTOM PROCEDURE TRAY) ×3 IMPLANT
PAD ARMBOARD 7.5X6 YLW CONV (MISCELLANEOUS) ×6 IMPLANT
SCREW CORT TI DBL LEAD 5X50 (Screw) ×3 IMPLANT
SCREW CORT TI DBL LEAD 5X56 (Screw) ×3 IMPLANT
SCREW CORT TI DBL LEAD 5X65 (Screw) ×3 IMPLANT
SCREW CORT TI DBL LEAD 5X70 (Screw) ×3 IMPLANT
SCREW CORT TI DBLE LEAD 5X28 (Screw) ×3 IMPLANT
SPONGE GAUZE 4X4 12PLY (GAUZE/BANDAGES/DRESSINGS) ×3 IMPLANT
STAPLER VISISTAT 35W (STAPLE) ×3 IMPLANT
STOCKINETTE IMPERVIOUS 9X36 MD (GAUZE/BANDAGES/DRESSINGS) ×3 IMPLANT
STOCKINETTE IMPERVIOUS LG (DRAPES) ×3 IMPLANT
SUCTION FRAZIER TIP 10 FR DISP (SUCTIONS) ×3 IMPLANT
SUT MNCRL AB 4-0 PS2 18 (SUTURE) ×3 IMPLANT
SUT VIC AB 0 CT1 18XCR BRD 8 (SUTURE) ×1 IMPLANT
SUT VIC AB 0 CT1 8-18 (SUTURE) ×2
SUT VIC AB 2-0 CT1 27 (SUTURE)
SUT VIC AB 2-0 CT1 TAPERPNT 27 (SUTURE) IMPLANT
SUT VIC AB 3-0 SH 8-18 (SUTURE) ×3 IMPLANT
TOWEL OR 17X24 6PK STRL BLUE (TOWEL DISPOSABLE) ×3 IMPLANT
TOWEL OR 17X26 10 PK STRL BLUE (TOWEL DISPOSABLE) ×3 IMPLANT
TRAY FOLEY CATH 16FRSI W/METER (SET/KITS/TRAYS/PACK) IMPLANT
WATER STERILE IRR 1000ML POUR (IV SOLUTION) ×3 IMPLANT

## 2014-05-01 NOTE — Anesthesia Procedure Notes (Signed)
Procedure Name: Intubation Date/Time: 05/01/2014 2:39 PM Performed by: Gaylene Brooks Pre-anesthesia Checklist: Patient identified, Timeout performed, Emergency Drugs available, Suction available and Patient being monitored Patient Re-evaluated:Patient Re-evaluated prior to inductionOxygen Delivery Method: Circle system utilized Preoxygenation: Pre-oxygenation with 100% oxygen Intubation Type: IV induction Ventilation: Mask ventilation without difficulty Tube type: Oral Tube size: 7.0 mm Number of attempts: 1 Airway Equipment and Method: Video-laryngoscopy Placement Confirmation: ETT inserted through vocal cords under direct vision,  breath sounds checked- equal and bilateral,  positive ETCO2 and CO2 detector Secured at: 22 cm Tube secured with: Tape Dental Injury: Teeth and Oropharynx as per pre-operative assessment  Comments: Pt with short TM distance and prominent overbite. Pt very concerned about dental damage. Elected to start with glidescope. Good view. ETT easily passed through cords.

## 2014-05-01 NOTE — Op Note (Signed)
04/30/2014 - 05/01/2014  4:36 PM  PATIENT:  Savannah Coleman    PRE-OPERATIVE DIAGNOSIS:  Right periprosthetic distal femur fracture  POST-OPERATIVE DIAGNOSIS:  Same  PROCEDURE:  INTRAMEDULLARY (IM) RETROGRADE FEMORAL NAILING  SURGEON:  Johnny Bridge, MD  PHYSICIAN ASSISTANT: Joya Gaskins, OPA-C, present and scrubbed throughout the case, critical for completion in a timely fashion, and for retraction, instrumentation, and closure.  ANESTHESIA:   General  PREOPERATIVE INDICATIONS:  Savannah Coleman is a  80 y.o. female with a diagnosis of Right periprosthetic femur fracture who failed conservative measures and elected for surgical management.    The risks benefits and alternatives were discussed with the patient preoperatively including but not limited to the risks of infection, bleeding, nerve injury, cardiopulmonary complications, the need for revision surgery, among others, and the patient was willing to proceed.  OPERATIVE IMPLANTS: [employer] [place] retrograde femoral nail size 12 mm with 4 distal interlocking bolts, and one proximal interlocking bolt  OPERATIVE FINDINGS: Severe osteopenia and weak bone.  OPERATIVE PROCEDURE: The patient was brought to the operating room and placed in the supine position. General anesthesia was administered. IV antibiotics were given. The right lower extremity was prepped and draped in usual sterile fashion. Time out was performed. Anterior incision was made through her previous total knee incision. The skin was elevated, and reflected, and parapatellar medial incision carried out. The previous skin incision was fairly lateral so I did have to elevate a flap in order to get medial.  After parapatellar arthrotomy was carried out I mobilized the patella, the arthroplasty components were all intact, I flexed the knee high enough to get the post out of the way, and then inserted a guidewire. The distal bone was extremely weak, and a guidewire wire was  placed by hand. Confirmation of AP and lateral views is used, I opened the distal femur with the reamer gently. Care was taken to protect the polyethylene post throughout the procedure. I placed a long guidewire up the femur, reduced the fracture anatomically, and then sequentially reamed up to a 13.5 and placed a size 12 nail. I did get just a little bit of chatter with the 13.5, and I was reluctant to go larger with the nail.  Appropriate length was confirmed by C-arm, and anatomic alignment on AP and lateral views.  I secured the nail distally with alternating medial and lateral screws, which I will then secured with the locking in her sleeve of the nail. The nail was recessed behind the total knee component, and there was no prominence, and no impingement on the polyethylene.  I then used perfect circles technique and locked the nail statically proximally. I had restored length and coronal alignment as well as sagittal plane alignment to the best of my ability, and I irrigated the wounds copiously, and repaired the parapatellar tissue with 0 Vicryl followed by 30 for the subcutaneous tissue with Monocryl for the skin and Steri-Strips and sterile gauze. The patient was awakened and returned to the PACU in stable and satisfactory condition, there were no complications and she tolerated the procedure well. She will be touch toe weightbearing for a period of about 6 weeks, and we'll also have her on anticoagulation to minimize risk for DVT.  Estimated blood loss was about 350 cc, there was at least 100 cc of blood in the upon exposure.

## 2014-05-01 NOTE — Progress Notes (Addendum)
INITIAL NUTRITION ASSESSMENT  DOCUMENTATION CODES Per approved criteria  -Not Applicable   INTERVENTION: - Advance diet as tolerated per MD.  - RD will monitor PO intake and add supplements as necessary. Patient likely to need supplements due to pre-op nausea/vomiting and poor appetite.   NUTRITION DIAGNOSIS: Increased nutrient needs related to wound healing as evidenced by femur fracture.   Goal: Patient will meet >/=90% of estimated nutrition needs  Monitor:  Diet advancement, PO intake, weight, labs  Reason for Assessment: Consult, Assessment of nutrition status and requirements  80 y.o. female  Admitting Dx: Femur fracture  ASSESSMENT: 80 year old female who had a mechanical fall with onset of severe right leg pain.   Patient in surgery for intramedullary retrograde femoral nailing for right femur fracture. Unable to obtain history from patient at this time due to being out of room at time of visit. Patient at risk due to hip fracture.   Per chart review, patient with indigestion and nausea throughout the night. She vomited up black, coffee ground. Patient with poor appetite.   Height: Ht Readings from Last 1 Encounters:  04/23/14 5\' 3"  (1.6 m)    Weight: Wt Readings from Last 1 Encounters:  04/23/14 144 lb (65.318 kg)    Ideal Body Weight: 115 pounds  % Ideal Body Weight: 125%  Wt Readings from Last 10 Encounters:  04/23/14 144 lb (65.318 kg)  10/28/13 145 lb (65.772 kg)  04/03/13 143 lb (64.864 kg)  09/09/12 143 lb (64.864 kg)  08/09/12 143 lb (64.864 kg)  05/07/12 138 lb (62.596 kg)  04/23/12 138 lb (62.596 kg)    Usual Body Weight: 140-145 pounds  % Usual Body Weight: 100%  BMI:  25.5 kg/m2. Patient is overweight.   Estimated Nutritional Needs: Kcal: 1450-1600 kcal Protein: 85-95 g Fluid: 1.9-2.1 L  Skin: Intact  Diet Order: NPO  EDUCATION NEEDS: -No education needs identified at this time   Intake/Output Summary (Last 24 hours) at  05/01/14 1534 Last data filed at 05/01/14 0500  Gross per 24 hour  Intake  717.5 ml  Output    650 ml  Net   67.5 ml    Last BM: PTA   Labs:   Recent Labs Lab 04/30/14 1400 04/30/14 1948  NA 140 136*  K 3.8 3.7  CL 102 98  CO2 23 26  BUN 12 14  CREATININE 0.60 0.69  CALCIUM 9.2 9.4  GLUCOSE 105* 146*    CBG (last 3)  No results found for this basename: GLUCAP,  in the last 72 hours  Scheduled Meds: . [MAR HOLD] amLODipine  5 mg Oral QHS  . [facility] HOLD] atorvastatin  40 mg Oral q1800  . [MAR HOLD]  ceFAZolin (ANCEF) IV  2 g Intravenous 30 min Pre-Op  . [MAR HOLD] pantoprazole (PROTONIX) IV  40 mg Intravenous Q12H    Continuous Infusions: . 0.45 % NaCl with KCl 20 mEq / L 75 mL/hr at 05/01/14 0634  . lactated ringers 50 mL/hr at 05/01/14 1355    Past Medical History  Diagnosis Date  . Hyperlipidemia   . Blood transfusion 03/2011  . Adenomatous colon polyp 2008  . Hypertension   . History of shingles   . Rosacea   . Colon polyps   . 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  . Family history of anesthesia complication     "  my son aspirated "  . GERD (gastroesophageal reflux disease)   . Arthritis     knees    Past Surgical History  Procedure Laterality Date  . Appendectomy  1958  . Cholecystectomy  1958  . Replacement total knee bilateral  2011 & 2012  . Colonoscopy    . Polypectomy    . Tonsillectomy      Larey Seat, RD, LDN Pager #: [phone] After-Hours Pager #: [phone]

## 2014-05-01 NOTE — Discharge Instructions (Signed)
Diet: As you were doing prior to hospitalization  ° °Shower:  May shower but keep the wounds dry, use an occlusive plastic wrap, NO SOAKING IN TUB.  If the bandage gets wet, change with a clean dry gauze. ° °Dressing:  You may change your dressing 3-5 days after surgery.  Then change the dressing daily with sterile gauze dressing.   ° °There are sticky tapes (steri-strips) on your wounds and all the stitches are absorbable.  Leave the steri-strips in place when changing your dressings, they will peel off with time, usually 2-3 weeks. ° °Activity:  Increase activity slowly as tolerated, but follow the weight bearing instructions below.  No lifting or driving for 6 weeks. ° °Weight Bearing:   Touch toe weight bearing.   ° °To prevent constipation: you may use a stool softener such as - ° °Colace (over the counter) 100 mg by mouth twice a day  °Drink plenty of fluids (prune juice may be helpful) and high fiber foods °Miralax (over the counter) for constipation as needed.   ° °Itching:  If you experience itching with your medications, try taking only a single pain pill, or even half a pain pill at a time.  You may take up to 10 pain pills per day, and you can also use benadryl over the counter for itching or also to help with sleep.  ° °Precautions:  If you experience chest pain or shortness of breath - call 911 immediately for transfer to the hospital emergency department!! ° °If you develop a fever greater that 101 F, purulent drainage from wound, increased redness or drainage from wound, or calf pain -- Call the office at [phone]                                                °Follow- Up Appointment:  Please call for an appointment to be seen in 2 weeks [place] - [phone] ° ° ° ° ° °

## 2014-05-01 NOTE — Progress Notes (Signed)
Orthopedic Tech Progress Note Patient Details:  [name] 10/08/1931 [id number]  Ortho Devices Ortho Device/Splint Location: trapeze bar patient helper Ortho Device/Splint Interventions: Application   Luverne Zerkle 05/01/2014, 10:55 PM

## 2014-05-01 NOTE — Progress Notes (Signed)
Utilization review completed.  

## 2014-05-01 NOTE — Transfer of Care (Signed)
Immediate Anesthesia Transfer of Care Note  Patient: Savannah Coleman  Procedure(s) Performed: Procedure(s): INTRAMEDULLARY (IM) RETROGRADE FEMORAL NAILING (Right)  Patient Location: PACU  Anesthesia Type:General  Level of Consciousness: awake, alert  and oriented  Airway & Oxygen Therapy: Patient Spontanous Breathing  Post-op Assessment: Report given to PACU RN  Post vital signs: stable  Complications: No apparent anesthesia complications

## 2014-05-01 NOTE — Anesthesia Postprocedure Evaluation (Signed)
  Anesthesia Post-op Note  Patient: Savannah Coleman  Procedure(s) Performed: Procedure(s): INTRAMEDULLARY (IM) RETROGRADE FEMORAL NAILING (Right)  Patient Location: PACU  Anesthesia Type:General  Level of Consciousness: awake, alert , oriented and patient cooperative  Airway and Oxygen Therapy: Patient Spontanous Breathing and Patient connected to nasal cannula oxygen  Post-op Pain: 4 /10, mild  Post-op Assessment: Post-op Vital signs reviewed, Patient's Cardiovascular Status Stable, Respiratory Function Stable, Patent Airway, No signs of Nausea or vomiting and Pain level controlled  Post-op Vital Signs: Reviewed and stable  Last Vitals:  Filed Vitals:   05/01/14 1830  BP: 118/59  Pulse: 82  Temp: 36.2 C  Resp: 19    Complications: No apparent anesthesia complications

## 2014-05-01 NOTE — Progress Notes (Signed)
The patient has been re-examined, and the chart reviewed, and there have been no interval changes to the documented history and physical.    She did have some nausea with some dark colored emesis, repeat H&H was stable, she attributes this to the medication that she is on, I also ordered IV Protonix. If this persists we may consider GI consult. The meantime we will plan to proceed with fixation of the femur.  The risks benefits and alternatives were discussed with the patient including but not limited to the risks of nonoperative treatment, versus surgical intervention including infection, bleeding, nerve injury, malunion, nonunion, the need for revision surgery, hardware prominence, hardware failure, the need for hardware removal, blood clots, cardiopulmonary complications, morbidity, mortality, among others, and they were willing to proceed.    Johnny Bridge, MD

## 2014-05-01 NOTE — Progress Notes (Signed)
Patient has been having indigestion and nausea throughout the night. Patient has had tums with some relief and zofran with little relief. Patient vomited up black, coffee grounds looking vomitus. Patient denies eating anything of dark color. Patient has not had an appetite. Patient has stated that she has had acid reflux in the past and takes occasional pepcid. Patient states that the acid reflux has gotten worse since she has been admitted. Will continue to monitor.

## 2014-05-01 NOTE — Progress Notes (Signed)
Orthopedic Tech Progress Note Patient Details:  Savannah Coleman [birth year] [id number] Patient not suitable for OHF use Patient ID: Consuela Mimes, female   DOB: 1931-07-14, 80 y.o.   MRN: [id number]   Fenton Foy 05/01/2014, 12:14 PM

## 2014-05-01 NOTE — Progress Notes (Addendum)
Reviewed note regarding the coffee ground emesis. Repeat H&H ordered, IV Protonix, guaiac stools, we'll monitor for clinically significant upper GI bleed.  Johnny Bridge, MD

## 2014-05-01 NOTE — Anesthesia Preprocedure Evaluation (Addendum)
Anesthesia Evaluation  Patient identified by MRN, date of birth, ID band Patient awake    Reviewed: Allergy & Precautions, H&P , NPO status , Patient's Chart, lab work & pertinent test results  History of Anesthesia Complications Negative for: history of anesthetic complications  Airway Mallampati: II TM Distance: >3 FB Neck ROM: Full  Mouth opening: Limited Mouth Opening  Dental  (+) Dental Advisory Given, Teeth Intact   Pulmonary former smoker,  breath sounds clear to auscultation        Cardiovascular hypertension, Pt. on medications Rhythm:Regular Rate:Normal     Neuro/Psych negative neurological ROS  negative psych ROS   GI/Hepatic Neg liver ROS, GERD-  ,  Endo/Other  negative endocrine ROS  Renal/GU negative Renal ROS     Musculoskeletal   Abdominal   Peds  Hematology   Anesthesia Other Findings   Reproductive/Obstetrics                        Anesthesia Physical Anesthesia Plan  ASA: III  Anesthesia Plan: General   Post-op Pain Management:    Induction: Intravenous  Airway Management Planned: Oral ETT  Additional Equipment:   Intra-op Plan:   Post-operative Plan: Extubation in OR  Informed Consent: I have reviewed the patients History and Physical, chart, labs and discussed the procedure including the risks, benefits and alternatives for the proposed anesthesia with the patient or authorized representative who has indicated his/her understanding and acceptance.   Dental advisory given  Plan Discussed with: CRNA, Anesthesiologist and Surgeon  Anesthesia Plan Comments:        Anesthesia Quick Evaluation

## 2014-05-02 DIAGNOSIS — D62 Acute posthemorrhagic anemia: Secondary | ICD-10-CM | POA: Diagnosis not present

## 2014-05-02 LAB — CBC
HCT: 25 % — ABNORMAL LOW (ref 36.0–46.0)
Hemoglobin: 8.4 g/dL — ABNORMAL LOW (ref 12.0–15.0)
MCH: 33.1 pg (ref 26.0–34.0)
MCHC: 33.6 g/dL (ref 30.0–36.0)
MCV: 98.4 fL (ref 78.0–100.0)
PLATELETS: 182 10*3/uL (ref 150–400)
RBC: 2.54 MIL/uL — ABNORMAL LOW (ref 3.87–5.11)
RDW: 12.6 % (ref 11.5–15.5)
WBC: 15.7 10*3/uL — AB (ref 4.0–10.5)

## 2014-05-02 LAB — BASIC METABOLIC PANEL
BUN: 9 mg/dL (ref 6–23)
CO2: 26 meq/L (ref 19–32)
CREATININE: 0.72 mg/dL (ref 0.50–1.10)
Calcium: 8.5 mg/dL (ref 8.4–10.5)
Chloride: 101 mEq/L (ref 96–112)
GFR calc non Af Amer: 79 mL/min — ABNORMAL LOW (ref >90)
Glucose, Bld: 119 mg/dL — ABNORMAL HIGH (ref 70–99)
POTASSIUM: 4.6 meq/L (ref 3.7–5.3)
SODIUM: 136 meq/L — AB (ref 137–147)

## 2014-05-02 LAB — URINALYSIS, ROUTINE W REFLEX MICROSCOPIC
Bilirubin Urine: NEGATIVE
Glucose, UA: NEGATIVE mg/dL
Hgb urine dipstick: NEGATIVE
Ketones, ur: 15 mg/dL — AB
NITRITE: NEGATIVE
PROTEIN: NEGATIVE mg/dL
Specific Gravity, Urine: 1.011 (ref 1.005–1.030)
UROBILINOGEN UA: 0.2 mg/dL (ref 0.0–1.0)
pH: 6 (ref 5.0–8.0)

## 2014-05-02 LAB — URINE MICROSCOPIC-ADD ON

## 2014-05-02 NOTE — Evaluation (Signed)
Physical Therapy Evaluation Patient Details Name: Savannah Coleman MRN: [id number] DOB: 1931-11-10 Today's Date: 05/02/2014   History of Present Illness  80 y.o. female s/p retrograde femoral IM nailing after sustaining a femoral fracture from a mechanical fall. Hx of hypertension  Clinical Impression  Patient is seen following the above procedure, presenting with functional limitations due to the deficits listed below (see PT Problem List). Pt compliant with weight bearing status with minimal assist during pivot-transfer. Patient will benefit from skilled PT to increase their independence and safety with mobility to allow discharge to the venue listed below.       Follow Up Recommendations SNF;Supervision/Assistance - 24 hour    Equipment Recommendations  Rolling walker with 5" wheels (Pt unsure if she already has RW)    Recommendations for Other Services       Precautions / Restrictions Precautions Precautions: Fall Restrictions Weight Bearing Restrictions: Yes RLE Weight Bearing: Touchdown weight bearing Other Position/Activity Restrictions: Toe touch weight bearing      Mobility  Bed Mobility Overal bed mobility: Needs Assistance Bed Mobility: Supine to Sit     Supine to sit: Min assist;HOB elevated     General bed mobility comments: min assist to control RLE off of bed and to scoot hips forward. Pt able to control trunk but needs verbal cues for technique and requires extra time.  Transfers Overall transfer level: Needs assistance Equipment used: Rolling walker (2 wheeled) Transfers: Sit to/from [facility] Sit to Stand: Mod assist;+2 safety/equipment Stand pivot transfers: Min assist;+2 safety/equipment       General transfer comment: Mod assist with sit-stand for power-up lift from lowest bed setting. Min assist with pivot transfer. Verbal cues for sequencing and technique. Encouraged to use upper extremities to maintain touch down weight  bearing status for RLE. Cues for upright posture. Pt was min assist for sit<>stand from [facility]. Cues for hand placement with both transfers from bed to bsc and bsc to recliner  Ambulation/Gait                Stairs            Wheelchair Mobility    Modified Rankin (Stroke Patients Only)       Balance Overall balance assessment: Needs assistance;History of Falls Sitting-balance support: Feet supported;Single extremity supported Sitting balance-Leahy Scale: Poor Sitting balance - Comments: one hand down for sitting   Standing balance support: Bilateral upper extremity supported Standing balance-Leahy Scale: Poor Standing balance comment: requires some assist to stand                             Pertinent Vitals/Pain 3/10 pain Patient repositioned in chair for comfort.     Home Living Family/patient expects to be discharged to:: Skilled nursing facility Living Arrangements: Alone Available Help at Discharge: [place] Type of Home: Apartment Home Access: Level entry     Home Layout: One level Home Equipment: Bedside commode;Shower seat      Prior Function Level of Independence: Independent               Hand Dominance   Dominant Hand: Right    Extremity/Trunk Assessment   Upper Extremity Assessment: Overall WFL for tasks assessed           Lower Extremity Assessment: RLE deficits/detail RLE Deficits / Details: limited strength and ROM. Limited assesment due to pain       Communication   Communication:  No difficulties  Cognition Arousal/Alertness: Awake/alert Behavior During Therapy: WFL for tasks assessed/performed Overall Cognitive Status: Within Functional Limits for tasks assessed                      General Comments General comments (skin integrity, edema, etc.): reviewed exercises and importance of being out of bed. encouraged and reviewed incentive inspirometer use. SpO2 dropped to 84% on room  air during transfers. Returned to 95% on 2L supplemental O2 in sitting within 1 minute with instructions for pursed lip breathing.    Exercises General Exercises - Lower Extremity Ankle Circles/Pumps: AROM;Both;10 reps;Seated Quad Sets: AROM;Right;10 reps;Supine Gluteal Sets: Strengthening;10 reps;Seated Other Exercises Other Exercises: triceps push-ups from recliner - one arm at a time. x5      Assessment/Plan    PT Assessment Patient needs continued PT services  PT Diagnosis Difficulty walking;Abnormality of gait;Acute pain   PT Problem List Decreased strength;Decreased range of motion;Decreased activity tolerance;Decreased balance;Decreased mobility;Decreased knowledge of use of DME;Decreased knowledge of precautions;Pain  PT Treatment Interventions DME instruction;Gait training;Functional mobility training;Therapeutic activities;Therapeutic exercise;Balance training;Neuromuscular re-education;Patient/family education;Modalities   PT Goals (Current goals can be found in the Care Plan section) Acute Rehab PT Goals Patient Stated Goal: go home again PT Goal Formulation: With patient Time For Goal Achievement: 05/09/14 Potential to Achieve Goals: Good    Frequency Min 3X/week   Barriers to discharge Decreased caregiver support pt lives alone    Co-evaluation               End of Session Equipment Utilized During Treatment: Gait belt;Oxygen Activity Tolerance: Patient tolerated treatment well Patient left: in chair;with call bell/phone within reach Nurse Communication: Mobility status;Other (comment) (SpO2 levels)         Time: [phone] (5 minutes spent toileting - non skilled PT) PT Time Calculation (min): 33 min   Charges:   PT Evaluation $Initial PT Evaluation Tier I: 1 Procedure PT Treatments $Therapeutic Activity: 8-22 mins   PT G CodesCamille Bal [place]ne, [place] 05/02/2014, 10:02 AM

## 2014-05-02 NOTE — Progress Notes (Addendum)
Subjective: 1 Day Post-Op Procedure(s) (LRB): INTRAMEDULLARY (IM) RETROGRADE FEMORAL NAILING (Right) Patient reports pain as moderate.    Objective: Vital signs in last 24 hours: Temp:  [97.2 F (36.2 C)-98.7 F (37.1 C)] 98.7 F (37.1 C) (05/16 0531) Pulse Rate:  [78-108] 80 (05/16 0531) Resp:  [13-19] 16 (05/16 0531) BP: (108-139)/(59-72) 112/65 mmHg (05/16 0531) SpO2:  [93 %-97 %] 95 % (05/16 0531) Weight:  [65.318 kg (144 lb)] 65.318 kg (144 lb) (05/16 0245)  Intake/Output from previous day: 05/15 0701 - 05/16 0700 In: 2305 [P.O.:180; I.V.:2100] Out: 1875 [Urine:1525; Blood:350] Intake/Output this shift: Total I/O In: 888.8 [I.V.:888.8] Out: -    Recent Labs  04/30/14 1400 04/30/14 1948 05/01/14 0933 05/01/14 2015 05/02/14 0615  HGB 13.0 12.6 12.3 9.7* 8.4*    Recent Labs  05/01/14 2015 05/02/14 0615  WBC 14.7* 15.7*  RBC 2.93* 2.54*  HCT 28.7* 25.0*  PLT 201 182    Recent Labs  04/30/14 1948 05/01/14 2015 05/02/14 0615  NA 136*  --  136*  K 3.7  --  4.6  CL 98  --  101  CO2 26  --  26  BUN 14  --  9  CREATININE 0.69 0.63 0.72  GLUCOSE 146*  --  119*  CALCIUM 9.4  --  8.5    Recent Labs  04/30/14 1400  INR 0.95    Neurovascular intact Sensation intact distally Intact pulses distally Dorsiflexion/Plantar flexion intact Incision: dressing C/D/I Compartment soft  Assessment/Plan: 1 Day Post-Op Procedure(s) (LRB): INTRAMEDULLARY (IM) RETROGRADE FEMORAL NAILING (Right) Up with therapy TTWB RLE dvt proph lovenox  Pain control as ordered Plan for d/c SNF Mon.  ABLA, will monitor and recheck h/h in am, may need transfusion tomorrow.  Savannah Coleman 05/02/2014, 10:04 AM

## 2014-05-03 LAB — CBC
HCT: 22.4 % — ABNORMAL LOW (ref 36.0–46.0)
HEMATOCRIT: 23.8 % — AB (ref 36.0–46.0)
Hemoglobin: 7.6 g/dL — ABNORMAL LOW (ref 12.0–15.0)
Hemoglobin: 8 g/dL — ABNORMAL LOW (ref 12.0–15.0)
MCH: 33 pg (ref 26.0–34.0)
MCH: 32.8 pg (ref 26.0–34.0)
MCHC: 33.9 g/dL (ref 30.0–36.0)
MCHC: 33.6 g/dL (ref 30.0–36.0)
MCV: 97.4 fL (ref 78.0–100.0)
MCV: 97.5 fL (ref 78.0–100.0)
Platelets: 172 10*3/uL (ref 150–400)
Platelets: 185 10*3/uL (ref 150–400)
RBC: 2.3 MIL/uL — ABNORMAL LOW (ref 3.87–5.11)
RBC: 2.44 MIL/uL — ABNORMAL LOW (ref 3.87–5.11)
RDW: 12.8 % (ref 11.5–15.5)
RDW: 12.8 % (ref 11.5–15.5)
WBC: 12.7 10*3/uL — ABNORMAL HIGH (ref 4.0–10.5)
WBC: 15.1 10*3/uL — ABNORMAL HIGH (ref 4.0–10.5)

## 2014-05-03 LAB — BASIC METABOLIC PANEL
BUN: 9 mg/dL (ref 6–23)
CO2: 27 mEq/L (ref 19–32)
Calcium: 8.7 mg/dL (ref 8.4–10.5)
Chloride: 99 mEq/L (ref 96–112)
Creatinine, Ser: 0.58 mg/dL (ref 0.50–1.10)
GFR, EST NON AFRICAN AMERICAN: 85 mL/min — AB (ref >90)
Glucose, Bld: 110 mg/dL — ABNORMAL HIGH (ref 70–99)
POTASSIUM: 4.3 meq/L (ref 3.7–5.3)
SODIUM: 134 meq/L — AB (ref 137–147)

## 2014-05-03 LAB — PREPARE RBC (CROSSMATCH)

## 2014-05-03 MED ORDER — ACETAMINOPHEN 325 MG PO TABS
650.0000 mg | ORAL_TABLET | Freq: Once | ORAL | Status: AC
Start: 2014-05-03 — End: 2014-05-03
  Administered 2014-05-03: 650 mg via ORAL
  Filled 2014-05-03: qty 2

## 2014-05-03 MED ORDER — PANTOPRAZOLE SODIUM 40 MG PO TBEC
40.0000 mg | DELAYED_RELEASE_TABLET | Freq: Two times a day (BID) | ORAL | Status: DC
  Administered 2014-05-03 – 2014-05-04 (×2): 40 mg via ORAL
  Filled 2014-05-03 (×2): qty 1

## 2014-05-03 MED ORDER — DIPHENHYDRAMINE HCL 25 MG PO CAPS
25.0000 mg | ORAL_CAPSULE | Freq: Once | ORAL | Status: AC
  Administered 2014-05-03: 25 mg via ORAL
  Filled 2014-05-03: qty 1

## 2014-05-03 NOTE — Progress Notes (Addendum)
Clinical Social Work Department CLINICAL SOCIAL WORK PLACEMENT NOTE 05/03/2014  Patient:  Savannah Coleman, Savannah Coleman  Account Number:  [id number] Admit date:  04/30/2014  Clinical Social Worker:  Blima Rich, Latanya Presser  Date/time:  05/03/2014 06:00 PM  Clinical Social Work is seeking post-discharge placement for this patient at the following level of care:   [place]   (*CSW will update this form in Epic as items are completed)   05/03/2014  Patient/family provided with [facility] Department of Clinical Social Work's list of facilities offering this level of care within the geographic area requested by the patient (or if unable, by the patient's family).  05/03/2014  Patient/family informed of their freedom to choose among providers that offer the needed level of care, that participate in Medicare, Medicaid or managed care program needed by the patient, have an available bed and are willing to accept the patient.  05/03/2014  Patient/family informed of [facility]' ownership interest in [facility], as well as of the fact that they are under no obligation to receive care at this facility.  PASARR submitted to EDS on 04/18/2011 PASARR number received from [employer] on 04/18/2011  FL2 transmitted to all facilities in geographic area requested by pt/family on  05/03/2014 FL2 transmitted to all facilities within larger geographic area on   Patient informed that his/her managed care company has contracts with or will negotiate with  certain facilities, including the following:     Patient/family informed of bed offers received:  05/04/2014 Patient chooses bed at [facility] Physician recommends and patient chooses bed at    Patient to be transferred [place]  on  05/04/2014 Patient to be transferred to facility by [facility]  The following physician request were entered in Epic:   Additional Comments:

## 2014-05-03 NOTE — Progress Notes (Signed)
Subjective: 2 Days Post-Op Procedure(s) (LRB): INTRAMEDULLARY (IM) RETROGRADE FEMORAL NAILING (Right) Patient reports pain as mild and moderate.  Denies dizziness or lightheadedness, feels tired no appetite.  Objective: Vital signs in last 24 hours: Temp:  [98.2 F (36.8 C)-99.3 F (37.4 C)] 98.2 F (36.8 C) (05/17 0658) Pulse Rate:  [90-107] 107 (05/17 0658) Resp:  [16-18] 18 (05/17 0658) BP: (116-135)/(51-71) 135/65 mmHg (05/17 0658) SpO2:  [94 %-97 %] 94 % (05/17 0658)  Intake/Output from previous day: 05/16 0701 - 05/17 0700 In: 1008.8 [P.O.:120; I.V.:888.8] Out: -  Intake/Output this shift: Total I/O In: 240 [P.O.:240] Out: -    Recent Labs  04/30/14 1948 05/01/14 0933 05/01/14 2015 05/02/14 0615 05/03/14 0605  HGB 12.6 12.3 9.7* 8.4* 7.6*    Recent Labs  05/02/14 0615 05/03/14 0605  WBC 15.7* 12.7*  RBC 2.54* 2.30*  HCT 25.0* 22.4*  PLT 182 172    Recent Labs  05/02/14 0615 05/03/14 0605  NA 136* 134*  K 4.6 4.3  CL 101 99  CO2 26 27  BUN 9 9  CREATININE 0.72 0.58  GLUCOSE 119* 110*  CALCIUM 8.5 8.7    Recent Labs  04/30/14 1400  INR 0.95    Neurovascular intact Sensation intact distally Intact pulses distally Dorsiflexion/Plantar flexion intact Incision: dressing C/D/I and scant drainage No cellulitis present Compartment soft  Assessment/Plan: 2 Days Post-Op Procedure(s) (LRB): INTRAMEDULLARY (IM) RETROGRADE FEMORAL NAILING (Right) Up with therapy Continue to monitor CBC will recheck today, if drop in Hgb will transfuse 2 units RBC  dvt proph lovenox Pain med as ordered NWB RLE Plan still for d/c SNF possible tomorrow   Chriss Czar 05/03/2014, 10:07 AM

## 2014-05-03 NOTE — Progress Notes (Signed)
Occupational Therapy Evaluation Patient Details Name: Savannah Coleman MRN: [id number] DOB: [birth year] Today's Date: 05/03/2014    History of Present Illness 80 y.o. female s/p retrograde femoral IM nailing after sustaining a femoral fracture from a mechanical fall. Hx of hypertension   Clinical Impression   PTA pt lived alone and was independent with ADLs and functional mobility. Pt c/o weakness and is awaiting blood transfusion, however requested to use BSC. Pt with decrease strength and endurance and requires increased assistance. Education and training completed for energy conservation, fall prevention, and compensatory techniques for LB ADLs. Pt will benefit from SNF for increased independence and all further OT needs can be met at next venue.    Follow Up Recommendations  SNF;Supervision/Assistance - 24 hour    Equipment Recommendations  Other (comment) (Defer to SNF)       Precautions / Restrictions Precautions Precautions: Fall Restrictions Weight Bearing Restrictions: Yes RLE Weight Bearing: Non weight bearing      Mobility Bed Mobility Overal bed mobility: Needs Assistance Bed Mobility: Sit to Supine     Supine to sit: Min assist;HOB elevated (assist for Bil LEs onto bed. )        Transfers Overall transfer level: Needs assistance Equipment used: Rolling walker (2 wheeled) Transfers: Sit to/from [employer] Transfers Sit to Stand: Mod assist;+2 safety/equipment Stand pivot transfers: Mod assist;+2 safety/equipment Squat pivot transfers: Mod assist;+2 safety/equipment     General transfer comment: Pt appears to have decreasing strength as she waits for blood transfusion, however requested to use BSC. Pt required increasing amount of support over course of session. VC's for hand placement and pt unable to maintain NWB status without assistance.         ADL Overall ADL's : Needs assistance/impaired Eating/Feeding:  Independent;Sitting   Grooming: Set up;Sitting   Upper Body Bathing: Set up;Sitting   Lower Body Bathing: Sit to/from stand;Maximal assistance   Upper Body Dressing : Set up;Sitting   Lower Body Dressing: Sit to/from stand;Total assistance   Toilet Transfer: Moderate assistance;+2 for safety/equipment;Stand-pivot;BSC;RW Toilet Transfer Details (indicate cue type and reason): Pt required +2 for safety, with one person holding RLE to maintain NWB status. Pt with decreased strength and difficulty powering up to stand. Pt unable to push through arms to clear LLE off floor. Toileting- Clothing Manipulation and Hygiene: Total assistance;Sit to/from stand   Tub/ Banker: Total assistance   Functional mobility during ADLs: Moderate assistance;Rolling walker;+2 for safety/equipment General ADL Comments: Pt waiting for blood transfusion, however required to use BSC. Pt with significant weakness since PT session and difficulty powering through Bil UEs to sit<>stand and to clear LLE off floor during stand-pivot. Pt not able to maintain NWB status and additional person required to hold RLE off floor. Pt required squat-pivot from recliner to bed due to decreasing strength. RN arrived with blood transfusion and OT session ended.      Vision  Per pt report, no change from baseline.                   Perception Perception Perception Tested?: No   Praxis Praxis Praxis tested?: Within functional limits    Pertinent Vitals/Pain Pt c/o pain in surgical site. Does not report pain level but grimaces with movement and reports stiffness. Repositioned in bed and notified RN.     Hand Dominance Right   Extremity/Trunk Assessment Upper Extremity Assessment Upper Extremity Assessment: Generalized weakness   Lower Extremity Assessment Lower Extremity Assessment: Defer to PT  evaluation   Cervical / Trunk Assessment Cervical / Trunk Assessment: Normal   Communication  Communication Communication: No difficulties   Cognition Arousal/Alertness: Awake/alert Behavior During Therapy: WFL for tasks assessed/performed Overall Cognitive Status: Within Functional Limits for tasks assessed                        Exercises   Other Exercises Other Exercises: encouraged pt to perform chair push ups x5        Home Living Family/patient expects to be discharged to:: Skilled nursing facility                                        Prior Functioning/Environment Level of Independence: Independent             OT Diagnosis: Generalized weakness;Acute pain   OT Problem List: Decreased strength;Decreased range of motion;Decreased activity tolerance;Impaired balance (sitting and/or standing);Decreased safety awareness;Decreased knowledge of use of DME or AE;Decreased knowledge of precautions;Pain;Impaired UE functional use    End of Session Equipment Utilized During Treatment: Rolling walker;Gait belt Nurse Communication: Weight bearing status;Mobility status  Activity Tolerance: Patient limited by fatigue Patient left: in bed;with call bell/phone within reach;with family/visitor present   Time: 6720-9198 OT Time Calculation (min): 26 min Charges:  OT General Charges $OT Visit: 1 Procedure OT Evaluation $Initial OT Evaluation Tier I: 1 Procedure OT Treatments $Self Care/Home Management : 8-22 mins  Juluis Rainier [phone] 05/03/2014, 5:54 PM

## 2014-05-03 NOTE — Progress Notes (Signed)
Physical Therapy Treatment Patient Details Name: Savannah Coleman MRN: [id number] DOB: 11/07/1931 Today's Date: 05/03/2014    History of Present Illness 80 y.o. female s/p retrograde femoral IM nailing after sustaining a femoral fracture from a mechanical fall. Hx of hypertension    PT Comments    Pt performed therapeutic exercises and transfer training this afternoon. Limited by fatigue, pt required physical assist to safely maintain weight bearing status during transfers. Pt reports being too tired for ambulation this afternoon but therapy will follow up for possible gait training at next therapy session. Patient will continue to benefit from skilled physical therapy services to further improve independence with functional mobility.   Follow Up Recommendations  SNF;Supervision/Assistance - 24 hour     Equipment Recommendations  Rolling walker with 5" wheels (Pt unsure if she already has RW)    Recommendations for Other Services       Precautions / Restrictions Precautions Precautions: Fall Restrictions Weight Bearing Restrictions: Yes RLE Weight Bearing: Non weight bearing    Mobility  Bed Mobility               General bed mobility comments: Pt in chair at start/finish of therapy  Transfers Overall transfer level: Needs assistance Equipment used: Rolling walker (2 wheeled) Transfers: Sit to/from [employer] Sit to Stand: Min assist Stand pivot transfers: Min assist       General transfer comment: Min assist today with sit<>stand and stand pivot transfer from recliner<>BSC. Min assist required for RLE to maintain NWB. Tactile cues to bring hips forward for upright posture. Verbal cues to increase UE use for support on RW. Pt with correct hand placement but cannot safely maintain WB status without assistance.  Ambulation/Gait                 Stairs            Wheelchair Mobility    Modified Rankin (Stroke Patients Only)       Balance                                    Cognition Arousal/Alertness: Awake/alert Behavior During Therapy: WFL for tasks assessed/performed Overall Cognitive Status: Within Functional Limits for tasks assessed                      Exercises General Exercises - Lower Extremity Ankle Circles/Pumps: AROM;Both;10 reps;Seated Heel Slides: AAROM;Right;10 reps;Supine Hip ABduction/ADduction: AAROM;Right;10 reps;Supine Straight Leg Raises: AROM;Right;10 reps;Seated Other Exercises Other Exercises: triceps push-ups from recliner - one arm at a time. x5    General Comments General comments (skin integrity, edema, etc.): Reviewed exercises and precautions for WB status.      Pertinent Vitals/Pain 3/10 pain. Pt reports much improved pain compared to yesterday Patient repositioned in chair for comfort.     Home Living                      Prior Function            PT Goals (current goals can now be found in the care plan section) Acute Rehab PT Goals Patient Stated Goal: go home again PT Goal Formulation: With patient Time For Goal Achievement: 05/09/14 Potential to Achieve Goals: Good Progress towards PT goals: Progressing toward goals    Frequency  Min 3X/week    PT Plan Current plan remains appropriate    Co-evaluation  End of Session Equipment Utilized During Treatment: Gait belt Activity Tolerance: Patient limited by fatigue Patient left: in chair;with call bell/phone within reach;with family/visitor present     Time: 4627-0350 PT Time Calculation (min): 25 min  Charges:  $Therapeutic Exercise: 8-22 mins $Therapeutic Activity: 8-22 mins                    G CodesCamille Bal [place], [place] 05/03/2014, 2:07 PM

## 2014-05-03 NOTE — Progress Notes (Signed)
Clinical Social Work Department BRIEF PSYCHOSOCIAL ASSESSMENT 05/03/2014  Patient:  Savannah Coleman, Savannah Coleman     Account Number:  [id number]     Admit date:  04/30/2014  Clinical Social Worker:  Rolinda Roan  Date/Time:  05/03/2014 05:56 PM  Referred by:  Physician  Date Referred:  05/01/2014 Referred for  SNF Placement   Other Referral:   Interview type:  Family Other interview type:    PSYCHOSOCIAL DATA Living Status:  ALONE Admitted from facility:   Level of care:   Primary support name:  Shelah Lewandowsky [phone] Primary support relationship to patient:  CHILD, ADULT Degree of support available:   Good support at bedside.    CURRENT CONCERNS  Other Concerns:    SOCIAL WORK ASSESSMENT / PLAN Clinical Social Worker (CSW) met with patient and her daughter Maudie Mercury was at bedside. Daughter reported that patient has been to [facility] before and would like to return for rehab. Patient agreeable to SNF search in [facility].   Assessment/plan status:  Psychosocial Support/Ongoing Assessment of Needs Other assessment/ plan:   Information/referral to community resources:    PATIENT'S/FAMILY'S RESPONSE TO PLAN OF CARE: Patient and daughter thanked CSW for visit and assisting with placement process.

## 2014-05-04 ENCOUNTER — Other Ambulatory Visit: Payer: Self-pay

## 2014-05-04 LAB — TYPE AND SCREEN
ABO/RH(D): O POS
Antibody Screen: NEGATIVE
UNIT DIVISION: 0
Unit division: 0

## 2014-05-04 LAB — CBC
HEMATOCRIT: 29.6 % — AB (ref 36.0–46.0)
HEMOGLOBIN: 9.9 g/dL — AB (ref 12.0–15.0)
MCH: 31.3 pg (ref 26.0–34.0)
MCHC: 33.4 g/dL (ref 30.0–36.0)
MCV: 93.7 fL (ref 78.0–100.0)
Platelets: 199 10*3/uL (ref 150–400)
RBC: 3.16 MIL/uL — ABNORMAL LOW (ref 3.87–5.11)
RDW: 15.6 % — ABNORMAL HIGH (ref 11.5–15.5)
WBC: 11.6 10*3/uL — AB (ref 4.0–10.5)

## 2014-05-04 MED ORDER — WHITE PETROLATUM GEL
Status: AC
  Administered 2014-05-04: 12:00:00
  Filled 2014-05-04: qty 5

## 2014-05-04 NOTE — Discharge Summary (Signed)
Physician Discharge Summary  Patient ID: Savannah Coleman MRN: [id number] DOB/AGE: 03/14/1931 80 y.o.  Admit date: 04/30/2014 Discharge date: 05/04/2014  Admission Diagnoses:  Right periprosthetic femoral shaft/supracondylar femur fracture   Discharge Diagnoses:  Active Problems:   Femur fracture, right   Postoperative anemia due to acute blood loss  possible minor upper GI bleed with coffee-ground emesis  Past Medical History  Diagnosis Date  . Hyperlipidemia   . Blood transfusion 03/2011  . Adenomatous colon polyp 2008  . Hypertension   . History of shingles   . Rosacea   . Colon polyps   . 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  . Family history of anesthesia complication     " my son aspirated "  . GERD (gastroesophageal reflux disease)   . Arthritis     knees    Surgeries: Procedure(s): INTRAMEDULLARY (IM) RETROGRADE FEMORAL NAILING on 04/30/2014 - 05/01/2014   Consultants (if any):    Discharged Condition: Improved  Hospital Course: Savannah Coleman is an 80 y.o. female who was admitted 04/30/2014 with a diagnosis of <principal problem not specified> and went to the operating room on 04/30/2014 - 05/01/2014 and underwent the above named procedures.    She was given perioperative antibiotics:  Anti-infectives   Start     Dose/Rate Route Frequency Ordered Stop   05/01/14 2030  ceFAZolin (ANCEF) IVPB 2 g/50 mL premix     2 g 100 mL/hr over 30 Minutes Intravenous Every 6 hours 05/01/14 1906 05/02/14 0544   05/01/14 0800  [MAR Hold]  ceFAZolin (ANCEF) IVPB 2 g/50 mL premix     (On MAR Hold since 05/01/14 1329)   2 g 100 mL/hr over 30 Minutes Intravenous 30 min pre-op 05/01/14 0738 05/01/14 1430    .  She was given sequential compression devices, early ambulation, and lovenox for DVT prophylaxis.  She benefited maximally from the hospital stay and there were no complications.  She did have one episode of  coffee ground emesis consistent with a minor upper GI bleed preoperatively that resolved with Protonix, however this did not persist through her hospitalization and so additional GI workup was not performed, however this may have contributed to her postoperative anemia, which required a blood transfusion. She will be touch toe weightbearing for 6 weeks.  Recent vital signs:  Filed Vitals:   05/04/14 0548  BP: 132/77  Pulse: 74  Temp: 98.4 F (36.9 C)  Resp: 20    Recent laboratory studies:  Lab Results  Component Value Date   HGB 8.0* 05/03/2014   HGB 7.6* 05/03/2014   HGB 8.4* 05/02/2014   Lab Results  Component Value Date   WBC 15.1* 05/03/2014   PLT 185 05/03/2014   Lab Results  Component Value Date   INR 0.95 04/30/2014   Lab Results  Component Value Date   NA 134* 05/03/2014   K 4.3 05/03/2014   CL 99 05/03/2014   CO2 27 05/03/2014   BUN 9 05/03/2014   CREATININE 0.58 05/03/2014   GLUCOSE 110* 05/03/2014    Discharge Medications:     Medication List    STOP taking these medications       diphenhydramine-acetaminophen 25-500 MG Tabs  Commonly known as:  TYLENOL PM      TAKE these medications       amLODipine 5 MG tablet  Commonly known as:  NORVASC  Take 5 mg by mouth at bedtime.     CENTRUM PO  Take 1 tablet by mouth daily.     enoxaparin 40 MG/0.4ML injection  Commonly known as:  LOVENOX  Inject 0.4 mLs (40 mg total) into the skin daily.     HYDROcodone-acetaminophen 10-325 MG per tablet  Commonly known as:  NORCO  Take 1-2 tablets by mouth every 6 (six) hours as needed.     ondansetron 4 MG tablet  Commonly known as:  ZOFRAN  Take 1 tablet (4 mg total) by mouth every 8 (eight) hours as needed for nausea or vomiting.     simvastatin 80 MG tablet  Commonly known as:  ZOCOR  Take 40 mg by mouth at bedtime.     VISINE 0.05 % ophthalmic solution  Generic drug:  tetrahydrozoline  Place 1 drop into both eyes daily after breakfast.        Diagnostic  Studies: Dg Hip Complete Right  04/30/2014   CLINICAL DATA:  FALL KNEE INJURY  EXAM: RIGHT HIP - COMPLETE 2+ VIEW  COMPARISON:  None.  FINDINGS: There is no evidence of hip fracture or dislocation. There is a periarticular sclerosis and hypertrophic spurring within the right hip. Degenerative changes are appreciated within the sacroiliac joints.  IMPRESSION: Osteoarthritic changes.  No acute osseous abnormalities.   Electronically Signed   By: Margaree Mackintosh M.D.   On: 04/30/2014 15:39   Dg Femur Right  05/01/2014   CLINICAL DATA:  Femur fracture  EXAM: DG C-ARM 61-120 MIN; RIGHT FEMUR - 2 VIEW  : COMPARISON:  04/30/2014  FINDINGS: A series of 6 fluoroscopic spot images document placement of an IM rod with 4distal and at least 1 proximal interlocking screws transfixing a transverse fracture of the distal femoral shaft in near anatomic alignment. Components of right knee arthroplasty are again noted.  IMPRESSION: 1. Internal fixation of femur fracture.   Electronically Signed   By: Arne Cleveland M.D.   On: 05/01/2014 16:55   Dg Femur Right  04/30/2014   CLINICAL DATA:  FALL KNEE INJURY  EXAM: RIGHT FEMUR - 2 VIEW  COMPARISON:  None.  FINDINGS: Transverse fracture within the distal femoral shaft with dorsal angulation and medial displacement. Fracture lines extend to the interface of the native distal femur and femoral component of the knee arthroplasty.  IMPRESSION: Distal femoral shaft fracture.   Electronically Signed   By: Margaree Mackintosh M.D.   On: 04/30/2014 15:41   Dg Knee 1-2 Views Right  04/30/2014   CLINICAL DATA:  Fall.  EXAM: RIGHT KNEE - 1-2 VIEW  COMPARISON:  None available for comparison at time of study interpretation.  FINDINGS: Distal femur diaphyseal transverse fracture with posterolateral displacement of distal bony fragments and approximately 14 mm of overriding impacted bony fragments. Fracture is at least 5 cm above the distal femur hardware. Bone mineral density is decreased without  destructive bony lesions.  Status post total knee arthroplasty, high-riding patella may be projectional. No periprosthetic lucency, soft tissue planes are nonsuspicious.  IMPRESSION: Distal femur displaced fracture without dislocation, status post total knee arthroplasty.   Electronically Signed   By: Elon Alas   On: 04/30/2014 15:39   Dg Chest Portable 1 View  04/30/2014   CLINICAL DATA:  Preoperative evaluation for 0 right leg surgery  EXAM: PORTABLE CHEST - 1 VIEW  COMPARISON:  04/06/2011  FINDINGS: Cardiac shadow is within normal limits. The lungs are well aerated with mild or left basilar scarring. No  focal infiltrate is seen. A calcified granuloma is again noted in the left mid lung. No acute bony abnormality is seen.  IMPRESSION: Stable chronic changes no acute abnormality is noted.   Electronically Signed   By: Inez Catalina M.D.   On: 04/30/2014 21:18   Dg Femur Right Port  05/01/2014   CLINICAL DATA:  Intramedullary nail placement.  EXAM: PORTABLE RIGHT FEMUR - 2 VIEW  COMPARISON:  05/01/2014  FINDINGS: Frontal and lateral projections of the femur demonstrate a retrograde intramedullary nail with single proximal and 4 distal interlocking screws, traversing the distal femoral metadiaphyseal fracture. A total knee prosthesis is present. No complicating feature identified.  IMPRESSION: 1. Retrograde femoral IM nail placement traversing the distal femoral fracture. No complicating feature observed.   Electronically Signed   By: Sherryl Barters M.D.   On: 05/01/2014 18:48   Dg C-arm 61-120 Min  05/01/2014   CLINICAL DATA:  Femur fracture  EXAM: DG C-ARM 61-120 MIN; RIGHT FEMUR - 2 VIEW  : COMPARISON:  04/30/2014  FINDINGS: A series of 6 fluoroscopic spot images document placement of an IM rod with 4distal and at least 1 proximal interlocking screws transfixing a transverse fracture of the distal femoral shaft in near anatomic alignment. Components of right knee arthroplasty are again noted.   IMPRESSION: 1. Internal fixation of femur fracture.   Electronically Signed   By: Arne Cleveland M.D.   On: 05/01/2014 16:55    Disposition: 03-Skilled Nursing Facility      Discharge Instructions   Touch down weight bearing    Complete by:  As directed            Follow-up Information   Follow up with Johnny Bridge, MD. Schedule an appointment as soon as possible for a visit in 2 weeks.   Specialty:  Orthopedic Surgery   Contact information:   [address] [facility] [address] [phone]        Signed: Johnny Bridge 05/04/2014, 7:20 AM

## 2014-05-04 NOTE — Progress Notes (Signed)
CSW (Clinical Education officer, museum) prepared pt dc packet and placed with shadow chart. CSW arranged non-emergent ambulance transport. Pt,pt nurse, and facility informed. Pt informed CSW she would contact her daughters. CSW signing off.   [place], [place]

## 2014-05-04 NOTE — Progress Notes (Signed)
CSW Armed forces technical officer) spoke with pt and informed of dc today. Pt is agreeable to dc to [facility]   After speaking with pt nurse, CSW became aware that pt does not have dc order yet. Pt nurse to page MD and notify that pt does have bed available at desired facility if medically stable.  CSW will await MD decision on dc.  [place], [place]

## 2014-05-04 NOTE — ED Provider Notes (Signed)
Medical screening examination/treatment/procedure(s) were performed by non-physician practitioner and as supervising physician I was immediately available for consultation/collaboration.   EKG Interpretation None       Tayshawn Purnell, MD 05/04/14 0848 

## 2014-05-04 NOTE — Progress Notes (Signed)
Physical Therapy Treatment Patient Details Name: Savannah Coleman MRN: [id number] DOB: Jan 21, 1931 Today's Date: 05/04/2014    History of Present Illness 81 y.o. female s/p retrograde femoral IM nailing after sustaining a femoral fracture from a mechanical fall. Hx of hypertension    PT Comments    Patient sitting up in recliner upon entering room. Agreeable to work on some hopping however very limited due to UE weakness and TWB on R LE. Patient and daughter educated on various LE therex and UE therex. Continue to recommend SNF for ongoing Physical Therapy.     Follow Up Recommendations  SNF;Supervision/Assistance - 24 hour     Equipment Recommendations  Rolling walker with 5" wheels    Recommendations for Other Services       Precautions / Restrictions Precautions Precautions: Fall Restrictions RLE Weight Bearing: Touchdown weight bearing (per discharge summary orders) Other Position/Activity Restrictions: Toe touch weight bearing    Mobility  Bed Mobility               General bed mobility comments: Pt in chair at start/finish of therapy  Transfers Overall transfer level: Needs assistance Equipment used: Rolling walker (2 wheeled)   Sit to Stand: Min assist;+2 physical assistance         General transfer comment: Patient able to assist more with standing today. Cues for technique. Required assist to keep increased weight off of R LE  Ambulation/Gait Ambulation/Gait assistance: Mod assist;+2 physical assistance Ambulation Distance (Feet): 5 Feet Assistive device: Rolling walker (2 wheeled) Gait Pattern/deviations: Step-to pattern     General Gait Details: Attempt to take a couple of hops of LLE however patient with weak UEs and unable to really maintain TDWB on R.    Stairs            Wheelchair Mobility    Modified Rankin (Stroke Patients Only)       Balance                                    Cognition Arousal/Alertness:  Awake/alert Behavior During Therapy: WFL for tasks assessed/performed Overall Cognitive Status: Within Functional Limits for tasks assessed                      Exercises General Exercises - Lower Extremity Long Arc QuadSinclair Ship;Right;10 reps Heel Slides: AAROM;Right;10 reps Hip ABduction/ADduction: AAROM;Right;10 reps;Supine Straight Leg Raises: AROM;Right;10 reps;Seated Shoulder Exercises Shoulder Flexion: Theraband;Both;10 reps;AROM Theraband Level (Shoulder Flexion): Level 3 (Green) Other Exercises Other Exercises: Chair push ups x10    General Comments        Pertinent Vitals/Pain no apparent distress     Home Living                      Prior Function            PT Goals (current goals can now be found in the care plan section) Progress towards PT goals: Progressing toward goals    Frequency  Min 3X/week    PT Plan Current plan remains appropriate    Co-evaluation             End of Session Equipment Utilized During Treatment: Gait belt Activity Tolerance: Patient tolerated treatment well Patient left: in chair;with call bell/phone within reach     Time: 0102-7253 PT Time Calculation (min): 29 min  Charges:  G Codes:      Tonia Brooms [place] 05/04/2014, 10:53 AM 05/04/2014 [place] PTA [phone] pager [phone] office

## 2014-05-04 NOTE — Progress Notes (Signed)
     Subjective:  Patient reports pain as mild.  Feels much better after her blood transfusion. She denies any recurrent nausea or coffee-ground emesis or vomiting.  Objective:   VITALS:   Filed Vitals:   05/03/14 2005 05/03/14 2032 05/03/14 2245 05/04/14 0548  BP: 139/72 136/74  132/77  Pulse: 81 80 72 74  Temp: 98.8 F (37.1 C) 98.6 F (37 C) 98.3 F (36.8 C) 98.4 F (36.9 C)  TempSrc:  Oral Oral Oral  Resp: 20 20 20 20   Height:      Weight:      SpO2: 99% 98% 98% 98%    Neurologically intact Dorsiflexion/Plantar flexion intact Incision: dressing C/D/I   Lab Results  Component Value Date   WBC 15.1* 05/03/2014   HGB 8.0* 05/03/2014   HCT 23.8* 05/03/2014   MCV 97.5 05/03/2014   PLT 185 05/03/2014     Assessment/Plan: 3 Days Post-Op   Active Problems:   Femur fracture, right   Postoperative anemia due to acute blood loss   Advance diet Up with therapy Discharge to SNF She would like to go to [facility] placement of a bed is available.   Johnny Bridge 05/04/2014, 7:23 AM   Marchia Bond, MD Cell [phone]

## 2014-05-04 NOTE — Telephone Encounter (Signed)
Nursing Home Refill was received from  [facility] @ [phone], phone number [phone]. I attempted to fill rx for Hydrocodone but was unable to due to Med/Orders section of chart was being accessed by someone else. I will try again tomorrow to fill medication (Hydrocodone 10-325)

## 2014-05-05 ENCOUNTER — Non-Acute Institutional Stay (SKILLED_NURSING_FACILITY): Payer: Medicare Other | Admitting: Internal Medicine

## 2014-05-05 ENCOUNTER — Encounter (HOSPITAL_COMMUNITY): Payer: Self-pay | Admitting: Orthopedic Surgery

## 2014-05-05 ENCOUNTER — Encounter: Payer: Self-pay | Admitting: *Deleted

## 2014-05-05 DIAGNOSIS — K5900 Constipation, unspecified: Secondary | ICD-10-CM

## 2014-05-05 DIAGNOSIS — S7290XA Unspecified fracture of unspecified femur, initial encounter for closed fracture: Secondary | ICD-10-CM

## 2014-05-05 DIAGNOSIS — I10 Essential (primary) hypertension: Secondary | ICD-10-CM

## 2014-05-05 DIAGNOSIS — S7291XA Unspecified fracture of right femur, initial encounter for closed fracture: Secondary | ICD-10-CM

## 2014-05-05 DIAGNOSIS — D62 Acute posthemorrhagic anemia: Secondary | ICD-10-CM

## 2014-05-05 MED ORDER — HYDROCODONE-ACETAMINOPHEN 10-325 MG PO TABS: ORAL_TABLET | ORAL | Status: DC

## 2014-05-05 NOTE — Progress Notes (Signed)
HISTORY & PHYSICAL  DATE: 05/05/2014   FACILITY: [facility]  LEVEL OF CARE: SNF (31)  ALLERGIES:  No Known Allergies  CHIEF COMPLAINT:  Manage right femur fracture, acute blood loss anemia and hypertension  HISTORY OF PRESENT ILLNESS: Patient is an 80 year old Caucasian female.  HIP FRACTURE: The patient had a mechanical fall and sustained a femur fracture.  Patient subsequently underwent surgical repair and tolerated the procedure well. Patient is admitted to this facility for short-term rehabilitation. Patient denies hip pain currently. No complications reported from the pain medications currently being used.  ANEMIA: The anemia has been stable. The patient denies fatigue, melena or hematochezia. No complications from the medications currently being used. Postoperatively patient suffered acute blood loss. She required transfusion.  HTN: Pt 's HTN remains stable.  Denies CP, sob, DOE, pedal edema, headaches, dizziness or visual disturbances.  No complications from the medications currently being used.  Last BP : 127/74.  PAST MEDICAL HISTORY :  Past Medical History  Diagnosis Date  . Hyperlipidemia   . Blood transfusion 03/2011  . Adenomatous colon polyp 2008  . Hypertension   . History of shingles   . Rosacea   . Colon polyps   . 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  . Family history of anesthesia complication     " my son aspirated "  . GERD (gastroesophageal reflux disease)   . Arthritis     knees    PAST SURGICAL HISTORY: Past Surgical History  Procedure Laterality Date  . Appendectomy  1958  . Cholecystectomy  1958  . Replacement total knee bilateral  2011 & 2012  . Colonoscopy    . Polypectomy    . Tonsillectomy    . Femur im nail Right 05/01/2014    Procedure: INTRAMEDULLARY (IM) RETROGRADE FEMORAL NAILING;  Surgeon: Johnny Bridge, MD;  Location: [facility];  Service:  Orthopedics;  Laterality: Right;    SOCIAL HISTORY:  reports that she has quit smoking. Her smoking use included Cigarettes. She smoked 0.00 packs per day. She has never used smokeless tobacco. She reports that she does not drink alcohol or use illicit drugs.  FAMILY HISTORY:  Family History  Problem Relation Age of Onset  . Colon cancer Neg Hx   . Stomach cancer Neg Hx   . COPD Father   . Asthma Father   . Asthma Sister     CURRENT MEDICATIONS: Reviewed per MAR/see medication list  REVIEW OF SYSTEMS:  GI: Complains of constipation ,See HPI otherwise 14 point ROS is negative.  PHYSICAL EXAMINATION  VS:  See VS section  GENERAL: no acute distress, normal body habitus EYES: conjunctivae normal, sclerae normal, normal eye lids MOUTH/THROAT: lips without lesions,no lesions in the mouth,tongue is without lesions,uvula elevates in midline NECK: supple, trachea midline, no neck masses, no thyroid tenderness, no thyromegaly LYMPHATICS: no LAN in the neck, no supraclavicular LAN RESPIRATORY: breathing is even & unlabored, BS CTAB CARDIAC: RRR, no murmur,no extra heart sounds, +2 right lower extremity edema and +1 left lower extremity edema GI:  ABDOMEN: abdomen soft, normal BS, no masses, no tenderness  LIVER/SPLEEN: no hepatomegaly, no splenomegaly MUSCULOSKELETAL: HEAD: normal to inspection & palpation BACK: no kyphosis, scoliosis or spinal processes tenderness EXTREMITIES: LEFT UPPER EXTREMITY: full range of motion, normal strength & tone RIGHT UPPER EXTREMITY:  full range  of motion, normal strength & tone LEFT LOWER EXTREMITY:  Moderate range of motion, normal strength & tone RIGHT LOWER EXTREMITY:  range of motion not tested due to surgery, normal strength & tone PSYCHIATRIC: the patient is alert & oriented to person, affect & behavior appropriate  LABS/RADIOLOGY:  Labs reviewed: Basic Metabolic Panel:  Recent Labs  04/30/14 1948 05/01/14 2015 05/02/14 0615  05/03/14 0605  NA 136*  --  136* 134*  K 3.7  --  4.6 4.3  CL 98  --  101 99  CO2 26  --  26 27  GLUCOSE 146*  --  119* 110*  BUN 14  --  9 9  CREATININE 0.69 0.63 0.72 0.58  CALCIUM 9.4  --  8.5 8.7   Liver Function Tests:  Recent Labs  04/16/14 0815  AST 28  ALT 14  ALKPHOS 45  BILITOT 0.5  PROT 6.5  ALBUMIN 4.2   CBC:  Recent Labs  04/16/14 0815 04/30/14 1400  05/03/14 0605 05/03/14 1358 05/04/14 0619  WBC 5.7 7.6  < > 12.7* 15.1* 11.6*  NEUTROABS 3.0 5.2  --   --   --   --   HGB 12.5 13.0  < > 7.6* 8.0* 9.9*  HCT 36.7 38.4  < > 22.4* 23.8* 29.6*  MCV 95.6 96.7  < > 97.4 97.5 93.7  PLT 302 253  < > 172 185 199  < > = values in this interval not displayed.  Lipid Panel:  Recent Labs  04/16/14 0815  HDL 66    RIGHT KNEE - 1-2 VIEW   COMPARISON:  None available for comparison at time of study interpretation.   FINDINGS: Distal femur diaphyseal transverse fracture with posterolateral displacement of distal bony fragments and approximately 14 mm of overriding impacted bony fragments. Fracture is at least 5 cm above the distal femur hardware. Bone mineral density is decreased without destructive bony lesions.   Status post total knee arthroplasty, high-riding patella may be projectional. No periprosthetic lucency, soft tissue planes are nonsuspicious.   IMPRESSION: Distal femur displaced fracture without dislocation, status post total knee arthroplasty.   RIGHT HIP - COMPLETE 2+ VIEW   COMPARISON:  None.   FINDINGS: There is no evidence of hip fracture or dislocation. There is a periarticular sclerosis and hypertrophic spurring within the right hip. Degenerative changes are appreciated within the sacroiliac joints.   IMPRESSION: Osteoarthritic changes.  No acute osseous abnormalities.   RIGHT FEMUR - 2 VIEW   COMPARISON:  None.   FINDINGS: Transverse fracture within the distal femoral shaft with dorsal angulation and medial  displacement. Fracture lines extend to the interface of the native distal femur and femoral component of the knee arthroplasty.   IMPRESSION: Distal femoral shaft fracture. PORTABLE CHEST - 1 VIEW   COMPARISON:  04/06/2011   FINDINGS: Cardiac shadow is within normal limits. The lungs are well aerated with mild or left basilar scarring. No focal infiltrate is seen. A calcified granuloma is again noted in the left mid lung. No acute bony abnormality is seen.   IMPRESSION: Stable chronic changes no acute abnormality is noted.   DG C-ARM 61-120 MIN; RIGHT FEMUR - 2 VIEW   : COMPARISON:   04/30/2014   FINDINGS: A series of 6 fluoroscopic spot images document placement of an IM rod with 4distal and at least 1 proximal interlocking screws transfixing a transverse fracture of the distal femoral shaft in near anatomic alignment. Components of right knee arthroplasty are again noted.  IMPRESSION: 1. Internal fixation of femur fracture. DG C-ARM 61-120 MIN; RIGHT FEMUR - 2 VIEW   : COMPARISON:   04/30/2014   FINDINGS: A series of 6 fluoroscopic spot images document placement of an IM rod with 4distal and at least 1 proximal interlocking screws transfixing a transverse fracture of the distal femoral shaft in near anatomic alignment. Components of right knee arthroplasty are again noted.   IMPRESSION: 1. Internal fixation of femur fracture. PORTABLE RIGHT FEMUR - 2 VIEW   COMPARISON:  05/01/2014   FINDINGS: Frontal and lateral projections of the femur demonstrate a retrograde intramedullary nail with single proximal and 4 distal interlocking screws, traversing the distal femoral metadiaphyseal fracture. A total knee prosthesis is present. No complicating feature identified.   IMPRESSION: 1. Retrograde femoral IM nail placement traversing the distal femoral fracture. No complicating feature observed.    ASSESSMENT/PLAN:  Right femur fracture-status post repair.  Continue rehabilitation. Acute blood loss anemia-status post transfusion. Recheck. Hypertension-well controlled Constipation-new problem. Start MiraLax 17 g daily. Hyperlipidemia-continue simvastatin Check CBC differential  I have reviewed patient's medical records received at admission/from hospitalization.  CPT CODE: 38101  Gayani Y Dasanayaka, [place] [phone]

## 2014-05-05 NOTE — Telephone Encounter (Signed)
RX printed and faxed. 

## 2014-05-06 ENCOUNTER — Other Ambulatory Visit: Payer: Self-pay | Admitting: *Deleted

## 2014-05-06 MED ORDER — TRAMADOL HCL 50 MG PO TABS: ORAL_TABLET | ORAL | Status: DC

## 2014-05-06 NOTE — Telephone Encounter (Signed)
[facility] 

## 2014-05-29 ENCOUNTER — Non-Acute Institutional Stay (SKILLED_NURSING_FACILITY): Payer: Medicare Other | Admitting: Adult Health

## 2014-05-29 ENCOUNTER — Encounter: Payer: Self-pay | Admitting: Adult Health

## 2014-05-29 DIAGNOSIS — K5900 Constipation, unspecified: Secondary | ICD-10-CM

## 2014-05-29 DIAGNOSIS — I10 Essential (primary) hypertension: Secondary | ICD-10-CM

## 2014-05-29 DIAGNOSIS — D62 Acute posthemorrhagic anemia: Secondary | ICD-10-CM

## 2014-05-29 DIAGNOSIS — E785 Hyperlipidemia, unspecified: Secondary | ICD-10-CM

## 2014-05-29 DIAGNOSIS — S7290XD Unspecified fracture of unspecified femur, subsequent encounter for closed fracture with routine healing: Secondary | ICD-10-CM

## 2014-05-29 DIAGNOSIS — S7291XD Unspecified fracture of right femur, subsequent encounter for closed fracture with routine healing: Secondary | ICD-10-CM

## 2014-06-05 ENCOUNTER — Telehealth: Payer: Self-pay

## 2014-06-05 NOTE — Telephone Encounter (Signed)
Savannah Coleman called and said she broke her femur about a month ago and she is feeling nauseas. She stop taking the pain medicine 2 days ago, but is still feeling nauseas and would like Dr Dion Saucier to call and advice her of what to do.

## 2014-07-01 ENCOUNTER — Encounter: Payer: Self-pay | Admitting: Adult Health

## 2014-07-01 DIAGNOSIS — D62 Acute posthemorrhagic anemia: Secondary | ICD-10-CM | POA: Insufficient documentation

## 2014-07-01 NOTE — Progress Notes (Signed)
This encounter was created in error - please disregard.

## 2014-07-01 NOTE — Progress Notes (Signed)
Patient ID: Savannah Coleman, female   DOB: 12/29/1930, 80 y.o.   MRN: [id number]              PROGRESS NOTE  DATE: 05/29/2014   FACILITY: [facility]  LEVEL OF CARE: SNF (31)  Acute Visit  CHIEF COMPLAINT:  Discharge Notes  HISTORY OF PRESENT ILLNESS: This is an 80 year old female who is for discharge home with Home health PT and OT. DME: 18"X18" wheelchair with elevating leg rests and wheelchair cushion. She has been admitted to [facility] on 05/04/14 from [facility] with Right femur fracture S/P IM femoral nailing. Patient was admitted to this facility for short-term rehabilitation after the patient's recent hospitalization.  Patient has completed SNF rehabilitation and therapy has cleared the patient for discharge.  Reassessment of ongoing problem(s):  HTN: Pt 's HTN remains stable.  Denies CP, sob, DOE, pedal edema, headaches, dizziness or visual disturbances.  No complications from the medications currently being used.  Last BP : 132/72  HYPERLIPIDEMIA: No complications from the medications presently being used. 4/15  fasting lipid panel showed : Cholesterol 149 triglycerides 114 HDL 66 LDL 60  ANEMIA: The anemia has been stable. The patient denies fatigue, melena or hematochezia. No complications from the medications currently being used. 5/15 hgb 10.4  PAST MEDICAL HISTORY : Reviewed.  No changes/see problem list  CURRENT MEDICATIONS: Reviewed per MAR/see medication list  REVIEW OF SYSTEMS:  GENERAL: no change in appetite, no fatigue, no weight changes, no fever, chills or weakness RESPIRATORY: no cough, SOB, DOE, wheezing, hemoptysis CARDIAC: no chest pain, edema or palpitations GI: no abdominal pain, diarrhea, constipation, heart burn, nausea or vomiting  PHYSICAL EXAMINATION  GENERAL: no acute distress, normal body habitus EYES: conjunctivae normal, sclerae normal, normal eye lids NECK: supple, trachea midline, no neck masses, no thyroid  tenderness, no thyromegaly LYMPHATICS: no LAN in the neck, no supraclavicular LAN RESPIRATORY: breathing is even & unlabored, BS CTAB CARDIAC: RRR, no murmur,no extra heart sounds, no edema GI: abdomen soft, normal BS, no masses, no tenderness, no hepatomegaly, no splenomegaly  EXTREMITIES: able to move all 4 extremities; ambulates with walker PSYCHIATRIC: the patient is alert & oriented to person, affect & behavior appropriate  LABS/RADIOLOGY: Labs reviewed: Basic Metabolic Panel:  Recent Labs  04/30/14 1948 05/01/14 2015 05/02/14 0615 05/03/14 0605  NA 136*  --  136* 134*  K 3.7  --  4.6 4.3  CL 98  --  101 99  CO2 26  --  26 27  GLUCOSE 146*  --  119* 110*  BUN 14  --  9 9  CREATININE 0.69 0.63 0.72 0.58  CALCIUM 9.4  --  8.5 8.7   Liver Function Tests:  Recent Labs  04/16/14 0815  AST 28  ALT 14  ALKPHOS 45  BILITOT 0.5  PROT 6.5  ALBUMIN 4.2   CBC:  Recent Labs  04/16/14 0815 04/30/14 1400  05/03/14 0605 05/03/14 1358 05/04/14 0619  WBC 5.7 7.6  < > 12.7* 15.1* 11.6*  NEUTROABS 3.0 5.2  --   --   --   --   HGB 12.5 13.0  < > 7.6* 8.0* 9.9*  HCT 36.7 38.4  < > 22.4* 23.8* 29.6*  MCV 95.6 96.7  < > 97.4 97.5 93.7  PLT 302 253  < > 172 185 199  < > = values in this interval not displayed.  Lipid Panel:  Recent Labs  04/16/14 0815  HDL 66  ASSESSMENT/PLAN:  Right femur fracture S/P IM femoral nailing - for Home health PT and OT Hypertension - well-controlled; continue Norvasc Hyperlipidemia - continue Zocor Anemia, acute blood loss - stable Constipation - stable continue Colace and Miralax    I have filled out patient's discharge paperwork and written prescriptions.  Patient will receive home health PT and OT.   DME provided: 37"D42" wheelchair with elevating leg rests and wheelchair cushion  Total discharge time: Greater than 30 minutes  Discharge time involved coordination of the discharge process with social worker, nursing staff  and therapy department. Medical justification for home health services/DME verified.   CPT CODE: 87681   [name] - NP [facility] [phone]

## 2014-07-25 ENCOUNTER — Ambulatory Visit (INDEPENDENT_AMBULATORY_CARE_PROVIDER_SITE_OTHER): Payer: Medicare Other | Admitting: Emergency Medicine

## 2014-07-25 VITALS — BP 130/68 | HR 70 | Temp 98.2°F | Resp 16 | Ht 64.0 in | Wt 136.4 lb

## 2014-07-25 DIAGNOSIS — R300 Dysuria: Secondary | ICD-10-CM

## 2014-07-25 DIAGNOSIS — N3000 Acute cystitis without hematuria: Secondary | ICD-10-CM

## 2014-07-25 LAB — POCT URINALYSIS DIPSTICK
Bilirubin, UA: NEGATIVE
Blood, UA: NEGATIVE
Glucose, UA: NEGATIVE
Nitrite, UA: NEGATIVE
Protein, UA: 30
Spec Grav, UA: 1.015
Urobilinogen, UA: 1
pH, UA: 7

## 2014-07-25 LAB — POCT UA - MICROSCOPIC ONLY
Casts, Ur, LPF, POC: NEGATIVE
Crystals, Ur, HPF, POC: NEGATIVE
Mucus, UA: NEGATIVE
Yeast, UA: NEGATIVE

## 2014-07-25 MED ORDER — SULFAMETHOXAZOLE-TMP DS 800-160 MG PO TABS: 1.0000 | ORAL_TABLET | Freq: Two times a day (BID) | ORAL | Status: DC

## 2014-07-25 MED ORDER — PHENAZOPYRIDINE HCL 200 MG PO TABS: 200.0000 mg | ORAL_TABLET | Freq: Three times a day (TID) | ORAL | Status: DC | PRN

## 2014-07-25 NOTE — Progress Notes (Signed)
[facility] [address], [place] [address] [phone]  Date:  07/25/2014   Name:  Savannah Coleman   DOB:  13-Mar-1931   MRN:  [id number]  PCP:  Ival Bible, MD    Chief Complaint: Urinary Tract Infection   History of Present Illness:  Savannah Coleman is a 80 y.o. very pleasant female patient who presents with the following:  Dysuria, urgency and frequency.  No hematuria. No fever or chills.  No nausea or vomiting.  No incontinence.   No abdominal pain, other GI or GYN symptoms.   Had IM nailing in May and was likely catheterized at that time. No improvement with over the counter medications or other home remedies.  Denies other complaint or health concern today.   Patient Active Problem List   Diagnosis Date Noted  . Acute blood loss anemia 07/01/2014  . Unspecified constipation 05/05/2014  . Postoperative anemia due to acute blood loss 05/02/2014  . Femur fracture, right 04/30/2014  . Other and unspecified hyperlipidemia 04/07/2013  . Routine general medical examination at a health care facility 04/07/2013  . Hypertension   . Hyperlipidemia   . Adenomatous colon polyp   . History of shingles   . Rosacea   . Colon polyps   . Positive PPD   . Blood transfusion 03/19/2011    Past Medical History  Diagnosis Date  . Hyperlipidemia   . Blood transfusion 03/2011  . Adenomatous colon polyp 2008  . Hypertension   . History of shingles   . Rosacea   . Colon polyps   . 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  . Family history of anesthesia complication     " my son aspirated "  . GERD (gastroesophageal reflux disease)   . Arthritis     knees  . Blood transfusion without reported diagnosis     Past Surgical History  Procedure Laterality Date  . Appendectomy  1958  . Cholecystectomy  1958  . Replacement total knee bilateral  2011 & 2012  . Colonoscopy    . Polypectomy    .  Tonsillectomy    . Femur im nail Right 05/01/2014    Procedure: INTRAMEDULLARY (IM) RETROGRADE FEMORAL NAILING;  Surgeon: Johnny Bridge, MD;  Location: [facility];  Service: Orthopedics;  Laterality: Right;  . Fracture surgery    . Joint replacement      History  Substance Use Topics  . Smoking status: Former Smoker    Types: Cigarettes  . Smokeless tobacco: Never Used     Comment: quit smoking in the early 80's  . Alcohol Use: No    Family History  Problem Relation Age of Onset  . Colon cancer Neg Hx   . Stomach cancer Neg Hx   . COPD Father   . Asthma Father   . Asthma Sister     No Known Allergies  Medication list has been reviewed and updated.  Current Outpatient Prescriptions on File Prior to Visit  Medication Sig Dispense Refill  . amLODipine (NORVASC) 5 MG tablet Take 5 mg by mouth at bedtime.      . simvastatin (ZOCOR) 80 MG tablet Take 40 mg by mouth at bedtime.      . enoxaparin (LOVENOX) 40 MG/0.4ML injection Inject 0.4 mLs (40 mg total) into the skin daily.  21 Syringe  0  .  HYDROcodone-acetaminophen (NORCO) 10-325 MG per tablet 1 by mouth every 6 hours as needed for pain, 2 by mouth every 6 hours as needed for severe pain. DO NOT EXCEED 3 GM OF TYLENOL IN 24 HOURS  240 tablet  0  . Multiple Vitamins-Minerals (CENTRUM PO) Take 1 tablet by mouth daily.      . ondansetron (ZOFRAN) 4 MG tablet Take 1 tablet (4 mg total) by mouth every 8 (eight) hours as needed for nausea or vomiting.  30 tablet  0  . tetrahydrozoline (VISINE) 0.05 % ophthalmic solution Place 1 drop into both eyes daily after breakfast.      . traMADol (ULTRAM) 50 MG tablet Take one tablet by mouth every 4 to 6 hours as needed for pain  180 tablet  5   No current facility-administered medications on file prior to visit.    Review of Systems:  As per HPI, otherwise negative.    Physical Examination: Filed Vitals:   07/25/14 1104  BP: 130/68  Pulse: 70  Temp: 98.2 F (36.8 C)  Resp: 16    Filed Vitals:   07/25/14 1104  Height: 5\' 4"  (1.626 m)  Weight: 136 lb 6 oz (61.859 kg)   Body mass index is 23.4 kg/(m^2). Ideal Body Weight: Weight in (lb) to have BMI = 25: 145.3  GEN: WDWN, NAD, Non-toxic, A & O x 3 HEENT: Atraumatic, Normocephalic. Neck supple. No masses, No LAD. Ears and Nose: No external deformity. CV: RRR, No M/G/R. No JVD. No thrill. No extra heart sounds. PULM: CTA B, no wheezes, crackles, rhonchi. No retractions. No resp. distress. No accessory muscle use. ABD: S, NT, ND, +BS. No rebound. No HSM. EXTR: No c/c/e NEURO Normal gait.  PSYCH: Normally interactive. Conversant. Not depressed or anxious appearing.  Calm demeanor.    Assessment and Plan: Cystitis Septra Pyridium  Signed,  Ellison Carwin, MD   Results for orders placed in visit on 07/25/14  POCT UA - MICROSCOPIC ONLY      Result Value Ref Range   WBC, Ur, HPF, POC 4-6     RBC, urine, microscopic 0-2     Bacteria, U Microscopic trace     Mucus, UA neg     Epithelial cells, urine per micros 1-3     Crystals, Ur, HPF, POC neg     Casts, Ur, LPF, POC neg     Yeast, UA neg    POCT URINALYSIS DIPSTICK      Result Value Ref Range   Color, UA yellow     Clarity, UA hazy     Glucose, UA neg     Bilirubin, UA neg     Ketones, UA trace     Spec Grav, UA 1.015     Blood, UA neg     pH, UA 7.0     Protein, UA 30     Urobilinogen, UA 1.0     Nitrite, UA neg     Leukocytes, UA small (1+)

## 2014-07-25 NOTE — Patient Instructions (Signed)

## 2015-01-17 ENCOUNTER — Ambulatory Visit (INDEPENDENT_AMBULATORY_CARE_PROVIDER_SITE_OTHER): Payer: Medicare Other | Admitting: Family Medicine

## 2015-01-17 VITALS — BP 126/75 | HR 73 | Temp 97.6°F | Resp 18 | Wt 142.0 lb

## 2015-01-17 DIAGNOSIS — R300 Dysuria: Secondary | ICD-10-CM

## 2015-01-17 DIAGNOSIS — N3001 Acute cystitis with hematuria: Secondary | ICD-10-CM

## 2015-01-17 LAB — POCT UA - MICROSCOPIC ONLY
CASTS, UR, LPF, POC: NEGATIVE
CRYSTALS, UR, HPF, POC: NEGATIVE
MUCUS UA: NEGATIVE
YEAST UA: NEGATIVE

## 2015-01-17 LAB — POCT URINALYSIS DIPSTICK
GLUCOSE UA: NEGATIVE
KETONES UA: 15
NITRITE UA: POSITIVE
PROTEIN UA: 100
Spec Grav, UA: 1.025
Urobilinogen, UA: 1
pH, UA: 5

## 2015-01-17 MED ORDER — SULFAMETHOXAZOLE-TRIMETHOPRIM 800-160 MG PO TABS: 1.0000 | ORAL_TABLET | Freq: Two times a day (BID) | ORAL | Status: DC

## 2015-01-17 MED ORDER — PHENAZOPYRIDINE HCL 200 MG PO TABS: 200.0000 mg | ORAL_TABLET | Freq: Three times a day (TID) | ORAL | Status: DC | PRN

## 2015-01-17 NOTE — Patient Instructions (Signed)

## 2015-01-18 NOTE — Progress Notes (Addendum)
Subjective:  This chart was scribed for [name], MD by Jeanell Sparrow, ED Scribe. This patient was seen in room 8 and the patient's care was started at 12:03 PM.   Patient ID: Savannah Coleman, female    DOB: 03-06-1931, 81 y.o.   MRN: [id number] Chief Complaint  Patient presents with  . Urinary Tract Infection    Urinary Tract Infection  Associated symptoms include frequency. Pertinent negatives include no chills, nausea or vomiting.   HPI Comments: Savannah Coleman is a 81 y.o. female who presents to the [facility] complaining of a UTI that started yesterday.   6 months ago pt had a UTI and was treated with septra and tolerated it well. Does not have urine culture in chart over the past few years.   Pt states that he started having dysuria and urinary frequency yesterday afternoon. She reports that her urine appeared normal to her. He denies any back pain, fever, chills, nausea, constipation, diarrhea, vaginal discharge, rash, appetite change, or vomiting.  She reports that she tolerated the septra well 6 months ago without any yeast infection.   Past Medical History  Diagnosis Date  . Hyperlipidemia   . Blood transfusion 03/2011  . Adenomatous colon polyp 2008  . Hypertension   . History of shingles   . Rosacea   . Colon polyps   . 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  . Family history of anesthesia complication     " my son aspirated "  . GERD (gastroesophageal reflux disease)   . Arthritis     knees  . Blood transfusion without reported diagnosis    No Known Allergies Current Outpatient Prescriptions on File Prior to Visit  Medication Sig Dispense Refill  . amLODipine (NORVASC) 5 MG tablet Take 5 mg by mouth at bedtime.    . Multiple Vitamins-Minerals (CENTRUM PO) Take 1 tablet by mouth daily.    . simvastatin (ZOCOR) 80 MG tablet Take 40 mg by mouth at bedtime.     No current  facility-administered medications on file prior to visit.    Review of Systems  Constitutional: Negative for fever, chills and appetite change.  Gastrointestinal: Negative for nausea, vomiting and diarrhea.  Genitourinary: Positive for dysuria and frequency. Negative for vaginal discharge.  Musculoskeletal: Negative for back pain.  Skin: Negative for rash.       Objective:   Physical Exam  Constitutional: She is oriented to person, place, and time. She appears well-developed and well-nourished. No distress.  HENT:  Head: Normocephalic and atraumatic.  Neck: Neck supple. No tracheal deviation present.  Cardiovascular: Normal rate, regular rhythm and normal heart sounds.  Exam reveals no gallop and no friction rub.   No murmur heard. Pulmonary/Chest: Effort normal and breath sounds normal. No respiratory distress. She has no wheezes. She has no rales.  Abdominal: Soft. Bowel sounds are normal. She exhibits no distension and no mass. There is tenderness. There is no rebound and no guarding.  No CVA TTP. Mild TTP on bilateral upper quadrant.   Musculoskeletal: Normal range of motion.  Neurological: She is alert and oriented to person, place, and time.  Skin: Skin is warm and dry.  Psychiatric: She has a normal mood and affect. Her behavior is normal.  Nursing note and vitals reviewed.   BP 126/75 mmHg  Pulse 73  Temp(Src) 97.6 F (  36.4 C) (Oral)  Resp 18  Wt 142 lb (64.411 kg)  SpO2 95%     Assessment & Plan:   Results for orders placed or performed in visit on 01/17/15  POCT urinalysis dipstick  Result Value Ref Range   Color, UA yellow    Clarity, UA cloudy    Glucose, UA neg    Bilirubin, UA small    Ketones, UA 15    Spec Grav, UA 1.025    Blood, UA mod    pH, UA 5.0    Protein, UA 100    Urobilinogen, UA 1.0    Nitrite, UA positive    Leukocytes, UA large (3+)   POCT UA - Microscopic Only  Result Value Ref Range   WBC, Ur, HPF, POC 30-40    RBC, urine,  microscopic 25-30    Bacteria, U Microscopic 2+    Mucus, UA neg    Epithelial cells, urine per micros 2-4    Crystals, Ur, HPF, POC neg    Casts, Ur, LPF, POC neg    Yeast, UA neg    Dysuria - Plan: POCT urinalysis dipstick, POCT UA - Microscopic Only  Acute cystitis with hematuria - Plan: Urine culture - responded well to bactrim w/ last antibiotic 6 mos prev so will retry ADDENDUM: UTI resisted to bactrim so d/c and switch to keflex 500 bid x 10d.  Meds ordered this encounter  Medications  . sulfamethoxazole-trimethoprim (BACTRIM DS,SEPTRA DS) 800-160 MG per tablet    Sig: Take 1 tablet by mouth 2 (two) times daily.    Dispense:  10 tablet    Refill:  0  . phenazopyridine (PYRIDIUM) 200 MG tablet    Sig: Take 1 tablet (200 mg total) by mouth 3 (three) times daily as needed for pain.    Dispense:  10 tablet    Refill:  2    I personally performed the services described in this documentation, which was scribed in my presence. The recorded information has been reviewed and considered, and addended by me as needed.  [name], MD MPH   Results for orders placed or performed in visit on 01/17/15  Urine culture  Result Value Ref Range   Culture ESCHERICHIA COLI    Colony Count >=100,000 COLONIES/ML    Organism ID, Bacteria ESCHERICHIA COLI       Susceptibility   Escherichia coli -  (no method available)    AMPICILLIN >=32 Resistant     AMOX/CLAVULANIC 4 Sensitive     AMPICILLIN/SULBACTAM 4 Sensitive     PIP/TAZO <=4 Sensitive     IMIPENEM <=0.25 Sensitive     CEFAZOLIN <=4 Sensitive     CEFTRIAXONE <=1 Sensitive     CEFTAZIDIME <=1 Sensitive     CEFEPIME <=1 Sensitive     GENTAMICIN <=1 Sensitive     TOBRAMYCIN <=1 Sensitive     CIPROFLOXACIN <=0.25 Sensitive     LEVOFLOXACIN <=0.12 Sensitive     NITROFURANTOIN <=16 Sensitive     TRIMETH/SULFA >=320 Resistant   POCT urinalysis dipstick  Result Value Ref Range   Color, UA yellow    Clarity, UA cloudy    Glucose, UA  neg    Bilirubin, UA small    Ketones, UA 15    Spec Grav, UA 1.025    Blood, UA mod    pH, UA 5.0    Protein, UA 100    Urobilinogen, UA 1.0    Nitrite, UA positive    Leukocytes,  UA large (3+)   POCT UA - Microscopic Only  Result Value Ref Range   WBC, Ur, HPF, POC 30-40    RBC, urine, microscopic 25-30    Bacteria, U Microscopic 2+    Mucus, UA neg    Epithelial cells, urine per micros 2-4    Crystals, Ur, HPF, POC neg    Casts, Ur, LPF, POC neg    Yeast, UA neg

## 2015-01-19 LAB — URINE CULTURE: Colony Count: >=100000

## 2015-01-20 MED ORDER — CEPHALEXIN 500 MG PO CAPS: 500.0000 mg | ORAL_CAPSULE | Freq: Two times a day (BID) | ORAL | Status: DC

## 2015-01-20 NOTE — Addendum Note (Signed)
Addended by: [name] on: 01/20/2015 04:41 AM   Modules accepted: Orders, Medications

## 2015-08-16 ENCOUNTER — Ambulatory Visit (INDEPENDENT_AMBULATORY_CARE_PROVIDER_SITE_OTHER): Payer: Medicare Other | Admitting: Physician Assistant

## 2015-08-16 VITALS — BP 138/68 | HR 77 | Temp 98.4°F | Resp 16 | Ht 63.0 in | Wt 139.6 lb

## 2015-08-16 DIAGNOSIS — J209 Acute bronchitis, unspecified: Secondary | ICD-10-CM | POA: Diagnosis not present

## 2015-08-16 MED ORDER — AZITHROMYCIN 250 MG PO TABS: ORAL_TABLET | ORAL | Status: AC

## 2015-08-16 MED ORDER — GUAIFENESIN ER 1200 MG PO TB12: 1.0000 | ORAL_TABLET | Freq: Two times a day (BID) | ORAL | Status: DC | PRN

## 2015-08-16 NOTE — Progress Notes (Signed)
[facility] [address], [place] [address] [phone]  Date:  08/16/2015   Name:  Savannah Coleman   DOB:  24-Jan-1931   MRN:  [id number]  PCP:  Ival Bible, MD    Chief Complaint: Cough and Sore Throat   History of Present Illness:  This is a 81 y.o. female with PMH HTN, HLD who is presenting with cough and "scratchy" throat x 8 days. Cough is productive but swallows the mucus so not sure of color. She denies nasal congestion, otalgia, fever, chills, SOB, wheezing. Not a smoker. No hx asthma or COPD. Last took abx 8 months ago for UTI. No hx allergies.   PCP Dr. Dion Saucier - sees every 6 months.  Review of Systems:  Review of Systems See HPI  Patient Active Problem List   Diagnosis Date Noted  . Unspecified constipation 05/05/2014  . Postoperative anemia due to acute blood loss 05/02/2014  . Femur fracture, right 04/30/2014  . Hypertension   . Hyperlipidemia   . Adenomatous colon polyp   . History of shingles   . Rosacea   . Positive PPD   . Blood transfusion 03/19/2011    Prior to Admission medications   Medication Sig Start Date End Date Taking? Authorizing Provider  amLODipine (NORVASC) 5 MG tablet Take 5 mg by mouth at bedtime.   Yes Historical Provider, MD  Multiple Vitamins-Minerals (CENTRUM PO) Take 1 tablet by mouth daily.   Yes Historical Provider, MD  simvastatin (ZOCOR) 80 MG tablet Take 40 mg by mouth at bedtime.   Yes Historical Provider, MD                  No Known Allergies  Past Surgical History  Procedure Laterality Date  . Appendectomy  1958  . Cholecystectomy  1958  . Replacement total knee bilateral  2011 & 2012  . Colonoscopy    . Polypectomy    . Tonsillectomy    . Femur im nail Right 05/01/2014    Procedure: INTRAMEDULLARY (IM) RETROGRADE FEMORAL NAILING;  Surgeon: Johnny Bridge, MD;  Location: [facility];  Service: Orthopedics;  Laterality: Right;  . Fracture surgery    . Joint replacement      Social History   Substance Use Topics  . Smoking status: Former Smoker    Types: Cigarettes  . Smokeless tobacco: Never Used     Comment: quit smoking in the early 80's  . Alcohol Use: No    Family History  Problem Relation Age of Onset  . Colon cancer Neg Hx   . Stomach cancer Neg Hx   . COPD Father   . Asthma Father   . Asthma Sister     Medication list has been reviewed and updated.  Physical Examination:  Physical Exam  Constitutional: She is oriented to person, place, and time. She appears well-developed and well-nourished. No distress.  HENT:  Head: Normocephalic and atraumatic.  Right Ear: Hearing, external ear and ear canal normal.  Left Ear: Hearing, external ear and ear canal normal.  Nose: Nose normal.  Mouth/Throat: Uvula is midline, oropharynx is clear and moist and mucous membranes are normal.  Bilateral TMs retracted and mildly cloudy  Eyes: Conjunctivae and lids are normal. Right eye exhibits no discharge. Left eye exhibits no discharge. No scleral icterus.  Cardiovascular: Normal rate, regular rhythm, normal heart sounds and normal pulses.   No murmur heard. Pulmonary/Chest: Effort normal and breath sounds normal. No respiratory distress. She  has no wheezes. She has no rhonchi. She has no rales.  Musculoskeletal: Normal range of motion.  Lymphadenopathy:       Head (right side): No submental, no submandibular and no tonsillar adenopathy present.       Head (left side): No submental, no submandibular and no tonsillar adenopathy present.    She has no cervical adenopathy.  Neurological: She is alert and oriented to person, place, and time.  Skin: Skin is warm, dry and intact. No lesion and no rash noted.  Psychiatric: She has a normal mood and affect. Her speech is normal and behavior is normal. Thought content normal.   BP 138/68 mmHg  Pulse 77  Temp(Src) 98.4 F (36.9 C) (Oral)  Resp 16  Ht 5\' 3"  (1.6 m)  Wt 139 lb 9.6 oz (63.322 kg)  BMI 24.74 kg/m2  SpO2  96%  Assessment and Plan:  1. Acute bronchitis, unspecified organism Vitals normal. Lung exam normal. Will treat with mucinex and azithromycin. Return in 7-10 days if sx not improved. - Guaifenesin (MUCINEX MAXIMUM STRENGTH) 1200 MG TB12; Take 1 tablet (1,200 mg total) by mouth every 12 (twelve) hours as needed.  Dispense: 14 tablet; Refill: 1 - azithromycin (ZITHROMAX) 250 MG tablet; Take 2 tabs PO x 1 dose, then 1 tab PO QD x 4 days  Dispense: 6 tablet; Refill: 0   Benjaman Pott. Drenda Freeze, MHS [facility]  08/16/2015

## 2015-08-16 NOTE — Patient Instructions (Signed)
Drink plenty of water (64 oz/day) and get plenty of rest. Take mucinex twice a day. Take antibiotic as directed until finished If your symptoms are not improving in 7-10 days, return to clinic.

## 2015-12-12 ENCOUNTER — Emergency Department (HOSPITAL_COMMUNITY): Payer: Medicare Other

## 2015-12-12 ENCOUNTER — Emergency Department (HOSPITAL_COMMUNITY)
Admission: EM | Admit: 2015-12-12 | Discharge: 2015-12-12 | Disposition: A | Discharge status: Home / Self Care | Payer: Medicare Other | Attending: Emergency Medicine | Admitting: Emergency Medicine

## 2015-12-12 DIAGNOSIS — Z86010 Personal history of colonic polyps: Secondary | ICD-10-CM | POA: Insufficient documentation

## 2015-12-12 DIAGNOSIS — Z872 Personal history of diseases of the skin and subcutaneous tissue: Secondary | ICD-10-CM | POA: Diagnosis not present

## 2015-12-12 DIAGNOSIS — Z8719 Personal history of other diseases of the digestive system: Secondary | ICD-10-CM | POA: Insufficient documentation

## 2015-12-12 DIAGNOSIS — Z8739 Personal history of other diseases of the musculoskeletal system and connective tissue: Secondary | ICD-10-CM | POA: Diagnosis not present

## 2015-12-12 DIAGNOSIS — R51 Headache: Secondary | ICD-10-CM | POA: Insufficient documentation

## 2015-12-12 DIAGNOSIS — Z86018 Personal history of other benign neoplasm: Secondary | ICD-10-CM | POA: Insufficient documentation

## 2015-12-12 DIAGNOSIS — I10 Essential (primary) hypertension: Secondary | ICD-10-CM | POA: Diagnosis not present

## 2015-12-12 DIAGNOSIS — Z79899 Other long term (current) drug therapy: Secondary | ICD-10-CM | POA: Diagnosis not present

## 2015-12-12 DIAGNOSIS — Z87891 Personal history of nicotine dependence: Secondary | ICD-10-CM | POA: Diagnosis not present

## 2015-12-12 DIAGNOSIS — A084 Viral intestinal infection, unspecified: Secondary | ICD-10-CM | POA: Insufficient documentation

## 2015-12-12 DIAGNOSIS — E785 Hyperlipidemia, unspecified: Secondary | ICD-10-CM | POA: Diagnosis not present

## 2015-12-12 DIAGNOSIS — R509 Fever, unspecified: Secondary | ICD-10-CM | POA: Diagnosis present

## 2015-12-12 LAB — RAPID STREP SCREEN (MED CTR MEBANE ONLY): Streptococcus, Group A Screen (Direct): NEGATIVE

## 2015-12-12 LAB — COMPREHENSIVE METABOLIC PANEL
ALBUMIN: 3.2 g/dL — AB (ref 3.5–5.0)
ALT: 227 U/L — ABNORMAL HIGH (ref 14–54)
ANION GAP: 10 (ref 5–15)
AST: 187 U/L — ABNORMAL HIGH (ref 15–41)
Alkaline Phosphatase: 74 U/L (ref 38–126)
BILIRUBIN TOTAL: 0.7 mg/dL (ref 0.3–1.2)
BUN: 33 mg/dL — ABNORMAL HIGH (ref 6–20)
CO2: 25 mmol/L (ref 22–32)
Calcium: 9.3 mg/dL (ref 8.9–10.3)
Chloride: 103 mmol/L (ref 101–111)
Creatinine, Ser: 1.44 mg/dL — ABNORMAL HIGH (ref 0.44–1.00)
GFR calc Af Amer: 38 mL/min — ABNORMAL LOW (ref >60)
GFR calc non Af Amer: 33 mL/min — ABNORMAL LOW (ref >60)
GLUCOSE: 99 mg/dL (ref 65–99)
POTASSIUM: 3.4 mmol/L — AB (ref 3.5–5.1)
SODIUM: 138 mmol/L (ref 135–145)
TOTAL PROTEIN: 6.5 g/dL (ref 6.5–8.1)

## 2015-12-12 LAB — CBC WITH DIFFERENTIAL/PLATELET
BASOS ABS: 0 10*3/uL (ref 0.0–0.1)
BASOS PCT: 0 %
EOS ABS: 0 10*3/uL (ref 0.0–0.7)
EOS PCT: 0 %
HCT: 35.6 % — ABNORMAL LOW (ref 36.0–46.0)
HEMOGLOBIN: 11.8 g/dL — AB (ref 12.0–15.0)
LYMPHS ABS: 1.1 10*3/uL (ref 0.7–4.0)
Lymphocytes Relative: 6 %
MCH: 31.6 pg (ref 26.0–34.0)
MCHC: 33.1 g/dL (ref 30.0–36.0)
MCV: 95.4 fL (ref 78.0–100.0)
Monocytes Absolute: 1 10*3/uL (ref 0.1–1.0)
Monocytes Relative: 5 %
NEUTROS PCT: 89 %
Neutro Abs: 17.2 10*3/uL — ABNORMAL HIGH (ref 1.7–7.7)
PLATELETS: 186 10*3/uL (ref 150–400)
RBC: 3.73 MIL/uL — AB (ref 3.87–5.11)
RDW: 13.8 % (ref 11.5–15.5)
WBC: 19.3 10*3/uL — ABNORMAL HIGH (ref 4.0–10.5)

## 2015-12-12 LAB — URINE MICROSCOPIC-ADD ON

## 2015-12-12 LAB — URINALYSIS, ROUTINE W REFLEX MICROSCOPIC
Bilirubin Urine: NEGATIVE
GLUCOSE, UA: NEGATIVE mg/dL
Ketones, ur: NEGATIVE mg/dL
LEUKOCYTES UA: NEGATIVE
Nitrite: NEGATIVE
PROTEIN: NEGATIVE mg/dL
Specific Gravity, Urine: 1.015 (ref 1.005–1.030)
pH: 5 (ref 5.0–8.0)

## 2015-12-12 LAB — I-STAT CG4 LACTIC ACID, ED: Lactic Acid, Venous: 1.91 mmol/L (ref 0.5–2.0)

## 2015-12-12 MED ORDER — ONDANSETRON HCL 4 MG/2ML IJ SOLN
4.0000 mg | Freq: Once | INTRAMUSCULAR | Status: AC
  Administered 2015-12-12: 4 mg via INTRAVENOUS
  Filled 2015-12-12: qty 2

## 2015-12-12 MED ORDER — SODIUM CHLORIDE 0.9 % IV BOLUS (SEPSIS)
1000.0000 mL | Freq: Once | INTRAVENOUS | Status: AC
  Administered 2015-12-12: 1000 mL via INTRAVENOUS

## 2015-12-12 MED ORDER — ONDANSETRON HCL 4 MG PO TABS: 4.0000 mg | ORAL_TABLET | Freq: Four times a day (QID) | ORAL | Status: DC

## 2015-12-12 NOTE — ED Notes (Addendum)
Pt with "shaking" and fatigue and loose stool since Thursday.  Last night she was urinating every hour.  Now c/o headache, and feels like the glands in her throat.

## 2015-12-12 NOTE — ED Provider Notes (Signed)
CSN: [id number]     Arrival date & time 12/12/15  1026 History   First MD Initiated Contact with Patient 12/12/15 1215     Chief Complaint  Patient presents with  . Fever   HPI  Savannah Coleman is an 81 year old female with PMHx of HTN, GERD and arthritis presenting with fatigue and diarrhea. Pt states she has "just felt so sick" for the past 3 days. She reports intermittent chills and "the shakes". She also endorses nausea with one episode of vomiting. She states that she has been so nauseous that she has been unable to eat or drink over the weekend. She is also having multiple episodes of loose stools. Denies blood in stool or vomit. She reports feeling like her "glands are swollen in my throat". Denies sore throat or difficulty swallowing. She reports a generalized throbbing headache last evening which has improved today. She states that "it's much better, only a tiny bit hurting now". She also reports increased urinary frequency since last evening. Denies dysuria, flank pain or hematuria. Denies recent sick contacts. She has received her flu and pneumonia vaccines. Denies fevers, weakness, dizziness, syncope, neck pain, neck stiffness, chest pain, SOB, cough, abdominal pain, myalgias or rashes.   Past Medical History  Diagnosis Date  . Hyperlipidemia   . Blood transfusion 03/2011  . Adenomatous colon polyp 2008  . Hypertension   . History of shingles   . Rosacea   . Colon polyps   . 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  . Family history of anesthesia complication     " my son aspirated "  . GERD (gastroesophageal reflux disease)   . Arthritis     knees  . Blood transfusion without reported diagnosis    Past Surgical History  Procedure Laterality Date  . Appendectomy  1958  . Cholecystectomy  1958  . Replacement total knee bilateral  2011 & 2012  . Colonoscopy    . Polypectomy    . Tonsillectomy    . Femur im nail Right  05/01/2014    Procedure: INTRAMEDULLARY (IM) RETROGRADE FEMORAL NAILING;  Surgeon: Johnny Bridge, MD;  Location: [facility];  Service: Orthopedics;  Laterality: Right;  . Fracture surgery    . Joint replacement     Family History  Problem Relation Age of Onset  . Colon cancer Neg Hx   . Stomach cancer Neg Hx   . COPD Father   . Asthma Father   . Asthma Sister    Social History  Substance Use Topics  . Smoking status: Former Smoker    Types: Cigarettes  . Smokeless tobacco: Never Used     Comment: quit smoking in the early 80's  . Alcohol Use: No   OB History    No data available     Review of Systems  Constitutional: Positive for chills, appetite change and fatigue. Negative for fever.  HENT: Negative for congestion, rhinorrhea and sore throat.   Respiratory: Negative for shortness of breath.   Cardiovascular: Negative for chest pain.  Gastrointestinal: Positive for nausea, vomiting and diarrhea. Negative for abdominal pain and blood in stool.  Genitourinary: Positive for frequency. Negative for dysuria, hematuria and flank pain.  Musculoskeletal: Negative for myalgias.  Skin: Negative for rash.  Neurological: Positive for headaches. Negative for dizziness, syncope, weakness and numbness.  All other systems reviewed and are negative.  Allergies  Review of patient's allergies indicates no known allergies.  Home Medications   Prior to Admission medications   Medication Sig Start Date End Date Taking? Authorizing Provider  amLODipine (NORVASC) 5 MG tablet Take 5 mg by mouth at bedtime.   Yes Historical Provider, MD  Multiple Vitamins-Minerals (CENTRUM PO) Take 1 tablet by mouth daily.   Yes Historical Provider, MD  simvastatin (ZOCOR) 80 MG tablet Take 40 mg by mouth at bedtime.   Yes Historical Provider, MD  ondansetron (ZOFRAN) 4 MG tablet Take 1 tablet (4 mg total) by mouth every 6 (six) hours. 12/12/15   Dawit Tankard, PA-C   BP 141/81 mmHg  Pulse 81  Temp(Src)  97.8 F (36.6 C) (Oral)  Resp 16  Ht 5\' 3"  (1.6 m)  Wt 62.143 kg  BMI 24.27 kg/m2  SpO2 98% Physical Exam  Constitutional: She appears well-developed and well-nourished. No distress.  Nontoxic appearing, NAD  HENT:  Head: Normocephalic and atraumatic.  Mouth/Throat: Oropharynx is clear and moist. No oropharyngeal exudate.  Eyes: Conjunctivae and EOM are normal. Right eye exhibits no discharge. Left eye exhibits no discharge. No scleral icterus.  Neck: Normal range of motion. Neck supple.  Cardiovascular: Normal rate, regular rhythm and normal heart sounds.   Pulmonary/Chest: Effort normal and breath sounds normal. No respiratory distress. She has no wheezes. She has no rales.  Abdominal: Soft. There is no tenderness. There is no rebound and no guarding.  Musculoskeletal: Normal range of motion.  Moves all extremities spontaneously  Lymphadenopathy:    She has no cervical adenopathy.  Neurological: She is alert. Coordination normal.  Skin: Skin is warm and dry. No rash noted.  Psychiatric: She has a normal mood and affect. Her behavior is normal.  Nursing note and vitals reviewed.   ED Course  Procedures (including critical care time) Labs Review Labs Reviewed  COMPREHENSIVE METABOLIC PANEL - Abnormal; Notable for the following:    Potassium 3.4 (*)    BUN 33 (*)    Creatinine, Ser 1.44 (*)    Albumin 3.2 (*)    AST 187 (*)    ALT 227 (*)    GFR calc non Af Amer 33 (*)    GFR calc Af Amer 38 (*)    All other components within normal limits  URINALYSIS, ROUTINE W REFLEX MICROSCOPIC (NOT AT [facility]) - Abnormal; Notable for the following:    APPearance CLOUDY (*)    Hgb urine dipstick TRACE (*)    All other components within normal limits  CBC WITH DIFFERENTIAL/PLATELET - Abnormal; Notable for the following:    WBC 19.3 (*)    RBC 3.73 (*)    Hemoglobin 11.8 (*)    HCT 35.6 (*)    Neutro Abs 17.2 (*)    All other components within normal limits  URINE MICROSCOPIC-ADD ON  - Abnormal; Notable for the following:    Squamous Epithelial / LPF 0-5 (*)    Bacteria, UA FEW (*)    Casts GRANULAR CAST (*)    All other components within normal limits  URINE CULTURE  RAPID STREP SCREEN (NOT AT [facility])  CULTURE, GROUP A STREP  I-STAT CG4 LACTIC ACID, ED  I-STAT CG4 LACTIC ACID, ED    Imaging Review Dg Chest 2 View  12/12/2015  CLINICAL DATA:  Weakness and chills for 3 days.  Initial encounter. EXAM: CHEST  2 VIEW COMPARISON:  Single view of the chest 04/30/2014. PA and lateral chest 04/06/2011. FINDINGS: The chest is hyperexpanded  with attenuation of the pulmonary vasculature. No consolidative process, pneumothorax or effusion is seen. Minimal basilar atelectasis or scar is noted. Heart size is normal. Compression fracture deformity at the thoracolumbar junction is remote and seen on the 2012 exam. IMPRESSION: No acute abnormality. Findings suggestive of emphysema. Electronically Signed   By: Inge Rise M.D.   On: 12/12/2015 13:42   I have personally reviewed and evaluated these images and lab results as part of my medical decision-making.   EKG Interpretation None      MDM   Final diagnoses:  Viral gastroenteritis   Patient presenting with chills, nausea, vomiting and diarrhea. Symptoms consistent with viral gastroenteritis. 2 L Fluid bolus and zofran given in ED with improvement in symptoms. Vitals are stable, patient is afebrile and appears in no acute distress. Mucus membranes are moist. Abdomen is soft without tenderness. Blood work reviewed. WBC elevated; likely due to viral illness. Creatinine increased to 1.44 which is likely secondary to dehydration. Patient is tolerating PO liquids before discharge. Will discharge home with supportive therapy. Strict return precautions discussed with patient at bedside and given in discharge paperwork. Instructed pt to follow up with PCP in 1 week for recheck of creatinine. Dr. Lacinda Axon has seen and assessed the patient as  well. He agrees with plan to discharge. Patient is stable for discharge.      Josephina Gip, PA-C 12/14/15 [place], MD 12/15/15 [phone]

## 2015-12-12 NOTE — Discharge Instructions (Signed)
Schedule a follow up appointment with your PCP in 1 week for a follow up visit. Your creatinine was slightly elevated so you will need this rechecked.    Viral Gastroenteritis Viral gastroenteritis is also known as stomach flu. This condition affects the stomach and intestinal tract. It can cause sudden diarrhea and vomiting. The illness typically lasts 3 to 8 days. Most people develop an immune response that eventually gets rid of the virus. While this natural response develops, the virus can make you quite ill. CAUSES  Many different viruses can cause gastroenteritis, such as rotavirus or noroviruses. You can catch one of these viruses by consuming contaminated food or water. You may also catch a virus by sharing utensils or other personal items with an infected person or by touching a contaminated surface. SYMPTOMS  The most common symptoms are diarrhea and vomiting. These problems can cause a severe loss of body fluids (dehydration) and a body salt (electrolyte) imbalance. Other symptoms may include:  Fever.  Headache.  Fatigue.  Abdominal pain. DIAGNOSIS  Your caregiver can usually diagnose viral gastroenteritis based on your symptoms and a physical exam. A stool sample may also be taken to test for the presence of viruses or other infections. TREATMENT  This illness typically goes away on its own. Treatments are aimed at rehydration. The most serious cases of viral gastroenteritis involve vomiting so severely that you are not able to keep fluids down. In these cases, fluids must be given through an intravenous line (IV). HOME CARE INSTRUCTIONS   Drink enough fluids to keep your urine clear or pale yellow. Drink small amounts of fluids frequently and increase the amounts as tolerated.  Ask your caregiver for specific rehydration instructions.  Avoid:  Foods high in sugar.  Alcohol.  Carbonated drinks.  Tobacco.  Juice.  Caffeine drinks.  Extremely hot or cold  fluids.  Fatty, greasy foods.  Too much intake of anything at one time.  Dairy products until 24 to 48 hours after diarrhea stops.  You may consume probiotics. Probiotics are active cultures of beneficial bacteria. They may lessen the amount and number of diarrheal stools in adults. Probiotics can be found in yogurt with active cultures and in supplements.  Wash your hands well to avoid spreading the virus.  Only take over-the-counter or prescription medicines for pain, discomfort, or fever as directed by your caregiver. Do not give aspirin to children. Antidiarrheal medicines are not recommended.  Ask your caregiver if you should continue to take your regular prescribed and over-the-counter medicines.  Keep all follow-up appointments as directed by your caregiver. SEEK IMMEDIATE MEDICAL CARE IF:   You are unable to keep fluids down.  You do not urinate at least once every 6 to 8 hours.  You develop shortness of breath.  You notice blood in your stool or vomit. This may look like coffee grounds.  You have abdominal pain that increases or is concentrated in one small area (localized).  You have persistent vomiting or diarrhea.  You have a fever.  The patient is a child younger than 3 months, and he or she has a fever.  The patient is a child older than 3 months, and he or she has a fever and persistent symptoms.  The patient is a child older than 3 months, and he or she has a fever and symptoms suddenly get worse.  The patient is a baby, and he or she has no tears when crying. MAKE SURE YOU:   Understand these  instructions.  Will watch your condition.  Will get help right away if you are not doing well or get worse.   This information is not intended to replace advice given to you by your health care provider. Make sure you discuss any questions you have with your health care provider.   Document Released: 12/04/2005 Document Revised: 02/26/2012 Document Reviewed:  09/20/2011 Elsevier Interactive Patient Education [employer].

## 2015-12-13 LAB — URINE CULTURE

## 2015-12-14 LAB — CULTURE, GROUP A STREP: Strep A Culture: NEGATIVE

## 2017-03-15 ENCOUNTER — Encounter: Payer: Self-pay | Admitting: Gastroenterology

## 2017-05-16 ENCOUNTER — Encounter: Payer: Self-pay | Admitting: Gastroenterology

## 2017-05-16 ENCOUNTER — Encounter (INDEPENDENT_AMBULATORY_CARE_PROVIDER_SITE_OTHER): Payer: Self-pay

## 2017-05-16 ENCOUNTER — Ambulatory Visit (INDEPENDENT_AMBULATORY_CARE_PROVIDER_SITE_OTHER): Payer: Medicare Other | Admitting: Gastroenterology

## 2017-05-16 VITALS — BP 130/80 | HR 80 | Ht 63.0 in | Wt 135.2 lb

## 2017-05-16 DIAGNOSIS — Z86010 Personal history of colonic polyps: Secondary | ICD-10-CM | POA: Diagnosis not present

## 2017-05-16 DIAGNOSIS — Z1211 Encounter for screening for malignant neoplasm of colon: Secondary | ICD-10-CM | POA: Diagnosis not present

## 2017-05-16 MED ORDER — NA SULFATE-K SULFATE-MG SULF 17.5-3.13-1.6 GM/177ML PO SOLN: ORAL | 0 refills | Status: DC

## 2017-05-16 NOTE — Progress Notes (Signed)
HPI :  83 y/o female with history of colon adenomas, GERD , hypertension , here for new patient visit to discuss surveillance colonoscopy. She denies any FH of colon cancer  She denies any blood in the stools. No constipation or diarrhea in general. No abdominal pains that bother her.  Eating well, no nausea or vomiting. No weight loss.   She is otherwise healthy. No recent hospitalizations. She is very functional and active. We discussed at length below on whether or not she wants a colonoscopy at her age. Her daughter is "adament" that the patient have a colonoscopy, per the patient's report.  Colonoscopy 05/07/2012 - few small polyps removed, adenomas.    Past Medical History:  Diagnosis Date  . Adenomatous colon polyp 2008  . Arthritis    knees  . Blood transfusion 03/2011  . Blood transfusion without reported diagnosis   . Colon polyps   . Family history of anesthesia complication    " my son aspirated "  . GERD (gastroesophageal reflux disease)   . History of shingles   . Hyperlipidemia   . Hypertension   . 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  Marland Kitchen Rosacea      Past Surgical History:  Procedure Laterality Date  . APPENDECTOMY  1958  . CHOLECYSTECTOMY  1958  . COLONOSCOPY    . FEMUR IM NAIL Right 05/01/2014   Procedure: INTRAMEDULLARY (IM) RETROGRADE FEMORAL NAILING;  Surgeon: Johnny Bridge, MD;  Location: [facility];  Service: Orthopedics;  Laterality: Right;  . FRACTURE SURGERY    . JOINT REPLACEMENT    . POLYPECTOMY    . REPLACEMENT TOTAL KNEE BILATERAL  2011 & 2012  . TONSILLECTOMY     Family History  Problem Relation Age of Onset  . COPD Father   . Asthma Father   . Asthma Sister   . Colon cancer Neg Hx   . Stomach cancer Neg Hx    Social History  Substance Use Topics  . Smoking status: Former Smoker    Types: Cigarettes  . Smokeless tobacco: Never Used     Comment: quit smoking in the early  80's  . Alcohol use No   Current Outpatient Prescriptions  Medication Sig Dispense Refill  . amLODipine (NORVASC) 5 MG tablet Take 5 mg by mouth at bedtime.    . Multiple Vitamins-Minerals (CENTRUM PO) Take 1 tablet by mouth daily.    . simvastatin (ZOCOR) 80 MG tablet Take 40 mg by mouth at bedtime.     No current facility-administered medications for this visit.    No Known Allergies   Review of Systems: All systems reviewed and negative except where noted in HPI.     Physical Exam: BP (!) 168/96   Pulse 80   Ht 5\' 3"  (1.6 m)   Wt 135 lb 3.2 oz (61.3 kg)   BMI 23.95 kg/m  Constitutional: Pleasant,well-developed, female in no acute distress. HEENT: Normocephalic and atraumatic. Conjunctivae are normal. No scleral icterus. Neck supple.  Cardiovascular: Normal rate, regular rhythm.  Pulmonary/chest: Effort normal and breath sounds normal. No wheezing, rales or rhonchi. Abdominal: Soft, nondistended, nontender. . There are no masses palpable. No hepatomegaly. Extremities: no edema Lymphadenopathy: No cervical adenopathy noted. Neurological: Alert and oriented to person place and time. Skin: Skin is warm and dry. No rashes noted. Psychiatric: Normal mood and affect. Behavior is normal.   ASSESSMENT  AND PLAN: 83 year old female with a history of colon adenomas, medical history as outlined above, here to discuss potential surveillance colonoscopy, specifically due to her age. She has no significant comorbidities and is very functional and active at her age. We discussed that we often stop surveillance colonoscopy at her age depending on other medical problems and patient preference. The patient reports her daughter adamantly wants this done. I discussed the risks and benefits of colonoscopy and sedation with the patient, discussed that this is the patient's decision whether or not she wishes to do this. I think her life expectancy is good, and the patient wanted to proceed after  our discussion. Further recommendations pending the results.   Lakeview Cellar, MD [facility] Pager [phone]  CC: Jonathon Resides, MD

## 2017-05-16 NOTE — Patient Instructions (Signed)
You have been scheduled for a colonoscopy. Please follow written instructions given to you at your visit today.  Please pick up your prep supplies at the pharmacy within the next 1-3 days. If you use inhalers (even only as needed), please bring them with you on the day of your procedure.     I appreciate the opportunity to care for you.  

## 2017-05-28 ENCOUNTER — Telehealth: Payer: Self-pay | Admitting: Gastroenterology

## 2017-05-28 NOTE — Telephone Encounter (Signed)
Pt states that prep was $200. Sample left up front for pt. She will come pick it up tomorrow.

## 2017-06-08 ENCOUNTER — Encounter: Payer: Self-pay | Admitting: Gastroenterology

## 2017-06-22 ENCOUNTER — Encounter: Payer: Medicare Other | Admitting: Gastroenterology

## 2020-01-19 ENCOUNTER — Ambulatory Visit: Payer: Medicare Other

## 2020-01-25 ENCOUNTER — Ambulatory Visit: Payer: Medicare HMO

## 2020-04-17 ENCOUNTER — Encounter (HOSPITAL_COMMUNITY): Payer: Self-pay | Admitting: Emergency Medicine

## 2020-04-17 ENCOUNTER — Inpatient Hospital Stay (HOSPITAL_COMMUNITY): Payer: Medicare HMO

## 2020-04-17 ENCOUNTER — Other Ambulatory Visit: Payer: Self-pay

## 2020-04-17 ENCOUNTER — Emergency Department (HOSPITAL_COMMUNITY): Payer: Medicare HMO

## 2020-04-17 ENCOUNTER — Inpatient Hospital Stay (HOSPITAL_COMMUNITY)
Admission: EM | Admit: 2020-04-17 | Discharge: 2020-05-01 | DRG: 480 | Disposition: A | Discharge status: Rehabilitation Facility | Payer: Medicare HMO | Attending: Internal Medicine | Admitting: Internal Medicine

## 2020-04-17 DIAGNOSIS — Z86010 Personal history of colonic polyps: Secondary | ICD-10-CM | POA: Diagnosis not present

## 2020-04-17 DIAGNOSIS — Y93E1 Activity, personal bathing and showering: Secondary | ICD-10-CM | POA: Diagnosis not present

## 2020-04-17 DIAGNOSIS — I252 Old myocardial infarction: Secondary | ICD-10-CM | POA: Diagnosis not present

## 2020-04-17 DIAGNOSIS — S52532A Colles' fracture of left radius, initial encounter for closed fracture: Secondary | ICD-10-CM | POA: Diagnosis present

## 2020-04-17 DIAGNOSIS — W182XXA Fall in (into) shower or empty bathtub, initial encounter: Secondary | ICD-10-CM | POA: Diagnosis present

## 2020-04-17 DIAGNOSIS — E876 Hypokalemia: Secondary | ICD-10-CM | POA: Diagnosis not present

## 2020-04-17 DIAGNOSIS — Y92002 Bathroom of unspecified non-institutional (private) residence single-family (private) house as the place of occurrence of the external cause: Secondary | ICD-10-CM

## 2020-04-17 DIAGNOSIS — Z825 Family history of asthma and other chronic lower respiratory diseases: Secondary | ICD-10-CM

## 2020-04-17 DIAGNOSIS — Z87891 Personal history of nicotine dependence: Secondary | ICD-10-CM | POA: Diagnosis not present

## 2020-04-17 DIAGNOSIS — M810 Age-related osteoporosis without current pathological fracture: Secondary | ICD-10-CM | POA: Diagnosis present

## 2020-04-17 DIAGNOSIS — E785 Hyperlipidemia, unspecified: Secondary | ICD-10-CM | POA: Diagnosis present

## 2020-04-17 DIAGNOSIS — S72121A Displaced fracture of lesser trochanter of right femur, initial encounter for closed fracture: Secondary | ICD-10-CM | POA: Insufficient documentation

## 2020-04-17 DIAGNOSIS — S52531A Colles' fracture of right radius, initial encounter for closed fracture: Principal | ICD-10-CM | POA: Diagnosis present

## 2020-04-17 DIAGNOSIS — Z8619 Personal history of other infectious and parasitic diseases: Secondary | ICD-10-CM

## 2020-04-17 DIAGNOSIS — Z96653 Presence of artificial knee joint, bilateral: Secondary | ICD-10-CM | POA: Diagnosis present

## 2020-04-17 DIAGNOSIS — Z831 Family history of other infectious and parasitic diseases: Secondary | ICD-10-CM | POA: Diagnosis not present

## 2020-04-17 DIAGNOSIS — Z01811 Encounter for preprocedural respiratory examination: Secondary | ICD-10-CM

## 2020-04-17 DIAGNOSIS — S52502A Unspecified fracture of the lower end of left radius, initial encounter for closed fracture: Secondary | ICD-10-CM | POA: Diagnosis present

## 2020-04-17 DIAGNOSIS — S72141A Displaced intertrochanteric fracture of right femur, initial encounter for closed fracture: Secondary | ICD-10-CM | POA: Diagnosis present

## 2020-04-17 DIAGNOSIS — S62101A Fracture of unspecified carpal bone, right wrist, initial encounter for closed fracture: Secondary | ICD-10-CM | POA: Diagnosis present

## 2020-04-17 DIAGNOSIS — T84114A Breakdown (mechanical) of internal fixation device of right femur, initial encounter: Secondary | ICD-10-CM | POA: Diagnosis present

## 2020-04-17 DIAGNOSIS — Z9089 Acquired absence of other organs: Secondary | ICD-10-CM

## 2020-04-17 DIAGNOSIS — T148XXA Other injury of unspecified body region, initial encounter: Secondary | ICD-10-CM

## 2020-04-17 DIAGNOSIS — Z20822 Contact with and (suspected) exposure to covid-19: Secondary | ICD-10-CM | POA: Diagnosis present

## 2020-04-17 DIAGNOSIS — D539 Nutritional anemia, unspecified: Secondary | ICD-10-CM | POA: Diagnosis present

## 2020-04-17 DIAGNOSIS — K5900 Constipation, unspecified: Secondary | ICD-10-CM | POA: Diagnosis not present

## 2020-04-17 DIAGNOSIS — Z9049 Acquired absence of other specified parts of digestive tract: Secondary | ICD-10-CM

## 2020-04-17 DIAGNOSIS — D62 Acute posthemorrhagic anemia: Secondary | ICD-10-CM | POA: Diagnosis not present

## 2020-04-17 DIAGNOSIS — I10 Essential (primary) hypertension: Secondary | ICD-10-CM | POA: Diagnosis present

## 2020-04-17 DIAGNOSIS — Z8781 Personal history of (healed) traumatic fracture: Secondary | ICD-10-CM

## 2020-04-17 DIAGNOSIS — S52501A Unspecified fracture of the lower end of right radius, initial encounter for closed fracture: Secondary | ICD-10-CM | POA: Diagnosis present

## 2020-04-17 DIAGNOSIS — Z79899 Other long term (current) drug therapy: Secondary | ICD-10-CM

## 2020-04-17 DIAGNOSIS — R52 Pain, unspecified: Secondary | ICD-10-CM

## 2020-04-17 DIAGNOSIS — S0101XA Laceration without foreign body of scalp, initial encounter: Secondary | ICD-10-CM | POA: Diagnosis present

## 2020-04-17 DIAGNOSIS — W19XXXS Unspecified fall, sequela: Secondary | ICD-10-CM | POA: Diagnosis not present

## 2020-04-17 DIAGNOSIS — K219 Gastro-esophageal reflux disease without esophagitis: Secondary | ICD-10-CM | POA: Diagnosis present

## 2020-04-17 DIAGNOSIS — Z419 Encounter for procedure for purposes other than remedying health state, unspecified: Secondary | ICD-10-CM

## 2020-04-17 DIAGNOSIS — W19XXXA Unspecified fall, initial encounter: Secondary | ICD-10-CM

## 2020-04-17 DIAGNOSIS — M25551 Pain in right hip: Secondary | ICD-10-CM | POA: Diagnosis present

## 2020-04-17 DIAGNOSIS — S62101D Fracture of unspecified carpal bone, right wrist, subsequent encounter for fracture with routine healing: Secondary | ICD-10-CM | POA: Diagnosis not present

## 2020-04-17 HISTORY — DX: Personal history of (healed) traumatic fracture: Z87.81

## 2020-04-17 HISTORY — DX: Unspecified fracture of the lower end of left radius, initial encounter for closed fracture: S52.502A

## 2020-04-17 HISTORY — DX: Displaced intertrochanteric fracture of right femur, initial encounter for closed fracture: S72.141A

## 2020-04-17 HISTORY — DX: Unspecified fracture of the lower end of right radius, initial encounter for closed fracture: S52.501A

## 2020-04-17 LAB — CBC WITH DIFFERENTIAL/PLATELET
Abs Immature Granulocytes: 0.08 10*3/uL — ABNORMAL HIGH (ref 0.00–0.07)
Basophils Absolute: 0 10*3/uL (ref 0.0–0.1)
Basophils Relative: 0 %
Eosinophils Absolute: 0.1 10*3/uL (ref 0.0–0.5)
Eosinophils Relative: 0 %
HCT: 38.1 % (ref 36.0–46.0)
Hemoglobin: 12.2 g/dL (ref 12.0–15.0)
Immature Granulocytes: 1 %
Lymphocytes Relative: 10 %
Lymphs Abs: 1.2 10*3/uL (ref 0.7–4.0)
MCH: 32.4 pg (ref 26.0–34.0)
MCHC: 32 g/dL (ref 30.0–36.0)
MCV: 101.1 fL — ABNORMAL HIGH (ref 80.0–100.0)
Monocytes Absolute: 0.9 10*3/uL (ref 0.1–1.0)
Monocytes Relative: 7 %
Neutro Abs: 9.7 10*3/uL — ABNORMAL HIGH (ref 1.7–7.7)
Neutrophils Relative %: 82 %
Platelets: 219 10*3/uL (ref 150–400)
RBC: 3.77 MIL/uL — ABNORMAL LOW (ref 3.87–5.11)
RDW: 13.3 % (ref 11.5–15.5)
WBC: 11.9 10*3/uL — ABNORMAL HIGH (ref 4.0–10.5)
nRBC: 0 % (ref 0.0–0.2)

## 2020-04-17 LAB — COMPREHENSIVE METABOLIC PANEL
ALT: 23 U/L (ref 0–44)
AST: 43 U/L — ABNORMAL HIGH (ref 15–41)
Albumin: 3.9 g/dL (ref 3.5–5.0)
Alkaline Phosphatase: 56 U/L (ref 38–126)
Anion gap: 14 (ref 5–15)
BUN: 14 mg/dL (ref 8–23)
CO2: 23 mmol/L (ref 22–32)
Calcium: 9.3 mg/dL (ref 8.9–10.3)
Chloride: 104 mmol/L (ref 98–111)
Creatinine, Ser: 0.71 mg/dL (ref 0.44–1.00)
GFR calc Af Amer: >60 mL/min (ref >60)
GFR calc non Af Amer: >60 mL/min (ref >60)
Glucose, Bld: 114 mg/dL — ABNORMAL HIGH (ref 70–99)
Potassium: 3.7 mmol/L (ref 3.5–5.1)
Sodium: 141 mmol/L (ref 135–145)
Total Bilirubin: 0.9 mg/dL (ref 0.3–1.2)
Total Protein: 6.4 g/dL — ABNORMAL LOW (ref 6.5–8.1)

## 2020-04-17 LAB — TYPE AND SCREEN
ABO/RH(D): O POS
Antibody Screen: NEGATIVE

## 2020-04-17 LAB — RESPIRATORY PANEL BY RT PCR (FLU A&B, COVID)
Influenza A by PCR: NEGATIVE
Influenza B by PCR: NEGATIVE
SARS Coronavirus 2 by RT PCR: NEGATIVE

## 2020-04-17 MED ORDER — MORPHINE SULFATE (PF) 2 MG/ML IV SOLN
0.5000 mg | INTRAVENOUS | Status: DC | PRN
  Administered 2020-04-17 – 2020-04-18 (×4): 0.5 mg via INTRAVENOUS
  Filled 2020-04-17 (×4): qty 1

## 2020-04-17 MED ORDER — AMLODIPINE BESYLATE 5 MG PO TABS
5.0000 mg | ORAL_TABLET | Freq: Every day | ORAL | Status: DC
  Administered 2020-04-18 – 2020-05-01 (×13): 5 mg via ORAL
  Filled 2020-04-17 (×14): qty 1

## 2020-04-17 MED ORDER — MORPHINE SULFATE (PF) 4 MG/ML IV SOLN
4.0000 mg | Freq: Once | INTRAVENOUS | Status: AC
  Administered 2020-04-17: 4 mg via INTRAVENOUS
  Filled 2020-04-17: qty 1

## 2020-04-17 MED ORDER — BACITRACIN ZINC 500 UNIT/GM EX OINT
TOPICAL_OINTMENT | Freq: Two times a day (BID) | CUTANEOUS | Status: DC
  Administered 2020-04-18 – 2020-04-20 (×2): 1 via TOPICAL
  Filled 2020-04-17: qty 28.4
  Filled 2020-04-17: qty 0.9

## 2020-04-17 MED ORDER — HYDROCODONE-ACETAMINOPHEN 5-325 MG PO TABS
1.0000 | ORAL_TABLET | Freq: Four times a day (QID) | ORAL | Status: DC | PRN
  Administered 2020-04-17 – 2020-04-19 (×6): 2 via ORAL
  Administered 2020-04-20: 1 via ORAL
  Administered 2020-04-20 – 2020-04-24 (×12): 2 via ORAL
  Filled 2020-04-17: qty 2
  Filled 2020-04-17: qty 1
  Filled 2020-04-17: qty 2
  Filled 2020-04-17: qty 1
  Filled 2020-04-17 (×15): qty 2

## 2020-04-17 MED ORDER — MORPHINE SULFATE (PF) 4 MG/ML IV SOLN
4.0000 mg | INTRAVENOUS | Status: AC | PRN
  Administered 2020-04-17 (×2): 4 mg via INTRAVENOUS
  Filled 2020-04-17 (×2): qty 1

## 2020-04-17 MED ORDER — HEPARIN SODIUM (PORCINE) 5000 UNIT/ML IJ SOLN: 5000.0000 [IU] | Freq: Once | INTRAMUSCULAR | Status: DC

## 2020-04-17 MED ORDER — LIDOCAINE-EPINEPHRINE (PF) 2 %-1:200000 IJ SOLN
10.0000 mL | Freq: Once | INTRAMUSCULAR | Status: AC
  Administered 2020-04-17: 10 mL via INTRADERMAL
  Filled 2020-04-17: qty 20

## 2020-04-17 MED ORDER — SIMVASTATIN 20 MG PO TABS
20.0000 mg | ORAL_TABLET | Freq: Every day | ORAL | Status: DC
  Administered 2020-04-17 – 2020-04-30 (×14): 20 mg via ORAL
  Filled 2020-04-17 (×14): qty 1

## 2020-04-17 MED ORDER — ENOXAPARIN SODIUM 40 MG/0.4ML ~~LOC~~ SOLN
40.0000 mg | SUBCUTANEOUS | Status: DC
Start: 2020-04-17 — End: 2020-04-17

## 2020-04-17 MED ORDER — ENOXAPARIN SODIUM 40 MG/0.4ML ~~LOC~~ SOLN
40.0000 mg | Freq: Once | SUBCUTANEOUS | Status: AC
  Administered 2020-04-17: 40 mg via SUBCUTANEOUS
  Filled 2020-04-17: qty 0.4

## 2020-04-17 MED ORDER — CHLORHEXIDINE GLUCONATE 4 % EX LIQD
60.0000 mL | Freq: Once | CUTANEOUS | Status: AC
  Administered 2020-04-18: 4 via TOPICAL

## 2020-04-17 MED ORDER — MUPIROCIN 2 % EX OINT
1.0000 "application " | TOPICAL_OINTMENT | Freq: Two times a day (BID) | CUTANEOUS | Status: AC
  Administered 2020-04-18 – 2020-04-22 (×3): 1 via NASAL
  Filled 2020-04-17: qty 22

## 2020-04-17 NOTE — ED Provider Notes (Signed)
[facility] Provider Note   CSN: [id number] Arrival date & time: 04/17/20  1349     History Chief Complaint  Patient presents with  . Fall    Savannah Coleman is a 86 y.o. female.  HPI 86 year old female presents after a fall. Was in the shower and turned around and fell. Did not feel dizzy/lightheaded, and did not have any preceding symptoms. No loss of consciousness. Hit her right head, and hurt both wrists (R>L) and her right hip. Could not bend or walk on right right leg. Suffered a right temporal laceration.   Past Medical History:  Diagnosis Date  . Adenomatous colon polyp 2008  . Arthritis    knees  . Blood transfusion 03/2011  . Blood transfusion without reported diagnosis   . Closed fracture of left distal radius 04/17/2020  . Closed intertrochanteric fracture of hip, right, initial encounter ([place]) 04/17/2020  . Colon polyps   . Family history of anesthesia complication    " my son aspirated "  . GERD (gastroesophageal reflux disease)   . History of shingles   . Hyperlipidemia   . Hypertension   . 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  Marland Kitchen Rosacea     Patient Active Problem List   Diagnosis Date Noted  . Closed avulsion fracture of lesser trochanter of femur, right, initial encounter ([place]) 04/17/2020  . Closed fracture of right distal radius 04/17/2020  . Unspecified constipation 05/05/2014  . Postoperative anemia due to acute blood loss 05/02/2014  . Femur fracture, right ([place]) 04/30/2014  . Hypertension   . Hyperlipidemia   . Adenomatous colon polyp   . History of shingles   . Rosacea   . Positive PPD   . Blood transfusion 03/19/2011    Past Surgical History:  Procedure Laterality Date  . APPENDECTOMY  1958  . CHOLECYSTECTOMY  1958  . COLONOSCOPY    . FEMUR IM NAIL Right 05/01/2014   Procedure: INTRAMEDULLARY (IM) RETROGRADE FEMORAL NAILING;  Surgeon:  Johnny Bridge, MD;  Location: [facility];  Service: Orthopedics;  Laterality: Right;  . FRACTURE SURGERY    . JOINT REPLACEMENT    . POLYPECTOMY    . REPLACEMENT TOTAL KNEE BILATERAL  2011 & 2012  . TONSILLECTOMY       OB History   No obstetric history on file.     Family History  Problem Relation Age of Onset  . COPD Father   . Asthma Father   . Asthma Sister   . Colon cancer Neg Hx   . Stomach cancer Neg Hx     Social History   Tobacco Use  . Smoking status: Former Smoker    Types: Cigarettes  . Smokeless tobacco: Never Used  . Tobacco comment: quit smoking in the early 80's  Substance Use Topics  . Alcohol use: No  . Drug use: No    Home Medications Prior to Admission medications   Medication Sig Start Date End Date Taking? Authorizing Provider  amLODipine (NORVASC) 5 MG tablet Take 5 mg by mouth daily.    Yes [provider]  Ascorbic Acid (VITAMIN C PO) Take 1 tablet by mouth daily with lunch.   Yes [provider]  BIOTIN PO Take 1 tablet by mouth daily with lunch.   Yes [provider]  Calcium Carb-Cholecalciferol (CALCIUM+D3 PO) Take 1 tablet  by mouth daily with lunch.   Yes [provider]  Carboxymethylcellulose Sodium (THERATEARS OP) Place 1 drop into both eyes daily.   Yes [provider]  diphenhydramine-acetaminophen (TYLENOL PM) 25-500 MG TABS tablet Take 1 tablet by mouth at bedtime as needed (sleep).   Yes [provider]  Multiple Vitamin (MULTIVITAMIN WITH MINERALS) TABS tablet Take 1 tablet by mouth daily with lunch.   Yes [provider]  pantoprazole (PROTONIX) 40 MG tablet Take 40 mg by mouth daily. 03/18/20  Yes [provider]  POTASSIUM PO Take 1 tablet by mouth daily with lunch.   Yes [provider]  simvastatin (ZOCOR) 20 MG tablet Take 20 mg by mouth at bedtime. 04/17/20  Yes [provider]  VITAMIN D PO Take 1 tablet by mouth daily with lunch.   Yes  [provider]    Allergies    Patient has no known allergies.  Review of Systems   Review of Systems  Respiratory: Negative for shortness of breath.   Cardiovascular: Negative for chest pain.  Gastrointestinal: Negative for vomiting.  Musculoskeletal: Positive for arthralgias and joint swelling.  Neurological: Positive for headaches. Negative for syncope and light-headedness.  All other systems reviewed and are negative.   Physical Exam Updated Vital Signs BP (!) 155/83   Pulse 84   Temp 97.7 F (36.5 C) (Oral)   Resp (!) 24   Ht 5\' 2"  (1.575 m)   Wt 57.2 kg   SpO2 93%   BMI 23.05 kg/m   Physical Exam Vitals and nursing note reviewed.  Constitutional:      Appearance: She is well-developed. She is not ill-appearing or diaphoretic.     Interventions: Cervical collar in place.  HENT:     Head: Normocephalic. Laceration present.      Right Ear: External ear normal.     Left Ear: External ear normal.     Nose: Nose normal.  Eyes:     General:        Right eye: No discharge.        Left eye: No discharge.  Cardiovascular:     Rate and Rhythm: Normal rate and regular rhythm.     Pulses:          Radial pulses are 2+ on the right side and 2+ on the left side.       Dorsalis pedis pulses are 2+ on the right side and 2+ on the left side.     Heart sounds: Normal heart sounds.  Pulmonary:     Effort: Pulmonary effort is normal.     Breath sounds: Normal breath sounds.  Abdominal:     Palpations: Abdomen is soft.     Tenderness: There is no abdominal tenderness.  Musculoskeletal:     Right wrist: Swelling and tenderness present. No deformity. Decreased range of motion.     Left wrist: Tenderness present. No deformity.     Right hip: Tenderness present. Decreased range of motion.  Skin:    General: Skin is warm and dry.  Neurological:     Mental Status: She is alert.  Psychiatric:        Mood and Affect: Mood is not anxious.     ED Results /  Procedures / Treatments   Labs (all labs ordered are listed, but only abnormal results are displayed) Labs Reviewed  COMPREHENSIVE METABOLIC PANEL - Abnormal; Notable for the following components:      Result Value   Glucose,  Bld 114 (*)    Total Protein 6.4 (*)    AST 43 (*)    All other components within normal limits  CBC WITH DIFFERENTIAL/PLATELET - Abnormal; Notable for the following components:   WBC 11.9 (*)    RBC 3.77 (*)    MCV 101.1 (*)    Neutro Abs 9.7 (*)    Abs Immature Granulocytes 0.08 (*)    All other components within normal limits  RESPIRATORY PANEL BY RT PCR (FLU A&B, COVID)  SURGICAL PCR SCREEN  CBC  BASIC METABOLIC PANEL  TYPE AND SCREEN    EKG EKG Interpretation  Date/Time:  Saturday Apr 17 2020 17:01:48 EDT Ventricular Rate:  84 PR Interval:    QRS Duration: 98 QT Interval:  411 QTC Calculation: 486 R Axis:   -33 Text Interpretation: Sinus rhythm Left axis deviation Anterior infarct, old No old tracing to compare Confirmed by Sherwood Gambler [phone]) on 04/17/2020 6:32:05 PM   Radiology DG Wrist Complete Left  Result Date: 04/17/2020 CLINICAL DATA:  Slip and fall, pain EXAM: LEFT WRIST - COMPLETE 3+ VIEW COMPARISON:  None. FINDINGS: Osteopenia. No fracture or dislocation of the left wrist. The carpus is normally aligned. Generally mild arthrosis. Soft tissues are unremarkable. IMPRESSION: Osteopenia. No fracture or dislocation of the left wrist. The carpus is normally aligned. Generally mild arthrosis. Electronically Signed   By: Eddie Candle M.D.   On: 04/17/2020 16:13   DG Wrist Complete Right  Result Date: 04/17/2020 CLINICAL DATA:  Fall at home.  Complaining of wrist pain. EXAM: RIGHT WRIST - COMPLETE 3+ VIEW COMPARISON:  None. FINDINGS: Osteopenia. There is a mildly displaced transverse fracture at the radial head best appreciated on the lateral view. There are degenerative changes in the carpal bones. No additional acute finding identified. No  evidence of dislocation. There is regional soft tissue swelling. IMPRESSION: Mildly displaced transverse fracture at the right radial head. Electronically Signed   By: Audie Pinto M.D.   On: 04/17/2020 16:06   CT Head Wo Contrast  Result Date: 04/17/2020 CLINICAL DATA:  Acute pain due to trauma. Headache. Laceration to the right forehead status post fall. EXAM: CT HEAD WITHOUT CONTRAST TECHNIQUE: Contiguous axial images were obtained from the base of the skull through the vertex without intravenous contrast. COMPARISON:  None. FINDINGS: Brain: No evidence of acute infarction, hemorrhage, hydrocephalus, extra-axial collection or mass lesion/mass effect. Atrophy and chronic microvascular ischemic changes are noted. Vascular: No hyperdense vessel or unexpected calcification. Skull: Normal. Negative for fracture or focal lesion. Sinuses/Orbits: No acute finding. Other: None. IMPRESSION: 1. No acute intracranial abnormality. 2. Atrophy and chronic microvascular ischemic changes. Electronically Signed   By: Constance Holster M.D.   On: 04/17/2020 18:27   CT Cervical Spine Wo Contrast  Result Date: 04/17/2020 CLINICAL DATA:  Acute pain due to trauma EXAM: CT CERVICAL SPINE WITHOUT CONTRAST TECHNIQUE: Multidetector CT imaging of the cervical spine was performed without intravenous contrast. Multiplanar CT image reconstructions were also generated. COMPARISON:  None. FINDINGS: Alignment: Normal. Skull base and vertebrae: No acute fracture. No primary bone lesion or focal pathologic process. Soft tissues and spinal canal: No prevertebral fluid or swelling. No visible canal hematoma. Disc levels: Mild multilevel degenerative changes are noted throughout the cervical spine. These are greatest at the C6-C7 level. Upper chest: Negative. Other: None. IMPRESSION: 1. No acute fracture or subluxation of the cervical spine. 2. Mild multilevel degenerative changes. Electronically Signed   By: Constance Holster M.D.   On:  04/17/2020 18:34   Chest Portable 1 View  Result Date: 04/17/2020 CLINICAL DATA:  Preoperative evaluation, former smoker EXAM: PORTABLE CHEST 1 VIEW COMPARISON:  Portable exam 2019 hours compared to 12/12/2015 FINDINGS: Normal heart size and pulmonary vascularity. Tortuous thoracic aorta. Emphysematous and bronchitic changes likely reflecting COPD. No acute infiltrate, pleural effusion or pneumothorax. Bones demineralized. IMPRESSION: Bronchitic and emphysematous likely reflecting COPD. No acute abnormalities. Electronically Signed   By: Lavonia Dana M.D.   On: 04/17/2020 20:32   DG Hip Unilat  With Pelvis 2-3 Views Right  Result Date: 04/17/2020 CLINICAL DATA:  Fall at home. Right pain hip which shortening and rotation. EXAM: DG HIP (WITH OR WITHOUT PELVIS) 2-3V RIGHT COMPARISON:  Right hip radiographs 05/01/2014 FINDINGS: Partially visualized intramedullary rod in the right femur with proximal threaded screw. There is an avulsion fracture of the lesser trochanter which is new from the 2015 radiograph. There is no evidence of hip dislocation. There are bilateral degenerative changes of the hip. No additional acute finding in the visualized pelvis. Lower lumbar degenerative changes. IMPRESSION: Avulsion fracture of the lesser trochanter of the right femur at the level of the proximal threaded screw. Electronically Signed   By: Audie Pinto M.D.   On: 04/17/2020 16:03    Procedures .Marland KitchenLaceration Repair  Date/Time: 04/17/2020 5:50 PM Performed by: Sherwood Gambler, MD Authorized by: Sherwood Gambler, MD   Consent:    Consent obtained:  Verbal   Consent given by:  Patient Anesthesia (see MAR for exact dosages):    Anesthesia method:  Local infiltration   Local anesthetic:  Lidocaine 2% WITH epi Laceration details:    Location:  Scalp   Scalp location:  R temporal   Length (cm):  3 Repair type:    Repair type:  Intermediate Pre-procedure details:    Preparation:  Patient was prepped and  draped in usual sterile fashion Exploration:    Contaminated: no   Treatment:    Area cleansed with:  Saline   Amount of cleaning:  Standard   Irrigation solution:  Sterile saline   Irrigation method:  Syringe Skin repair:    Repair method:  Sutures   Suture size:  5-0   Suture material:  Fast-absorbing gut   Suture technique:  Simple interrupted   Number of sutures:  4 Approximation:    Approximation:  Close Post-procedure details:    Dressing:  Open (no dressing)   Patient tolerance of procedure:  Tolerated well, no immediate complications   (including critical care time)  Medications Ordered in ED Medications  bacitracin ointment (has no administration in time range)  HYDROcodone-acetaminophen (NORCO/VICODIN) 5-325 MG per tablet 1-2 tablet (2 tablets Oral Given 04/17/20 2126)  morphine 2 MG/ML injection 0.5 mg (0.5 mg Intravenous Given 04/17/20 2248)  simvastatin (ZOCOR) tablet 20 mg (20 mg Oral Given 04/17/20 2249)  amLODipine (NORVASC) tablet 5 mg (has no administration in time range)  chlorhexidine (HIBICLENS) 4 % liquid 4 application (has no administration in time range)  mupirocin ointment (BACTROBAN) 2 % 1 application (1 application Nasal Not Given 04/17/20 2249)  morphine 4 MG/ML injection 4 mg (4 mg Intravenous Given 04/17/20 1659)  lidocaine-EPINEPHrine (XYLOCAINE W/EPI) 2 %-1:200000 (PF) injection 10 mL (10 mLs Intradermal Given 04/17/20 1659)  morphine 4 MG/ML injection 4 mg (4 mg Intravenous Given 04/17/20 2036)  enoxaparin (LOVENOX) injection 40 mg (40 mg Subcutaneous Given 04/17/20 2249)    ED Course  I have reviewed the triage vital signs and the nursing notes.  Pertinent  labs & imaging results that were available during my care of the patient were reviewed by me and considered in my medical decision making (see chart for details).    MDM Rules/Calculators/A&P                      Patient's hip fracture and radius fracture was discussed with Dr. Mardelle Matte who will see and  manage. Laceration repaired. CT head is Ok, and c-spine is negative. c-collar removed. NV intact diffusely. Given IV morphine for pain control. CT, xray images personally reviewed, as well as labs. Admit to hospitalist service.  Final Clinical Impression(s) / ED Diagnoses Final diagnoses:  Closed fracture of distal end of right radius, unspecified fracture morphology, initial encounter  Closed avulsion fracture of lesser trochanter of right femur, initial encounter ([place])  Laceration of scalp, initial encounter    Rx / DC Orders ED Discharge Orders    None       Sherwood Gambler, MD 04/18/20 0111

## 2020-04-17 NOTE — Progress Notes (Signed)
Orthopedic Tech Progress Note Patient Details:  Savannah Coleman [birth year] [id number]  Ortho Devices Type of Ortho Device: Sugartong splint Ortho Device/Splint Interventions: Application   Post Interventions Patient Tolerated: Well   Majel Homer 04/17/2020, 7:16 PM

## 2020-04-17 NOTE — Consult Note (Signed)
ORTHOPAEDIC CONSULTATION  REQUESTING PHYSICIAN: Sherwood Gambler, MD  Chief Complaint: bilateral wrist pain, right hip pain   HPI: Savannah Coleman is a 86 y.o. female who presented to ED after fall in the shower. She hit the right side of her head, both wrists, and right hip. Right wrist hurts more than left. She could not bend or walk of right leg  X-rays were taken showing mildly displaced transverse fracture of the right distal radius and avulsion fracture of lesser trochanter of right femur at level of proximal screw. Orthopedics was consulted. Patient has history of left femur fracture with retrograde IM femoral nailing performed by Dr. Mardelle Matte in 2015.   On exam, patient has right arm in sugar- tong splint and is laying in bed. States right hip is in moderate and constant, worse with movement and better with rest. Right wrist pain is well controlled at this time.   Patient denies ever having had a distal radius fracture that she knows of, however she does say that she is a "tough lady".   Past Medical History:  Diagnosis Date  . Adenomatous colon polyp 2008  . Arthritis    knees  . Blood transfusion 03/2011  . Blood transfusion without reported diagnosis   . Colon polyps   . Family history of anesthesia complication    " my son aspirated "  . GERD (gastroesophageal reflux disease)   . History of shingles   . Hyperlipidemia   . Hypertension   . 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  Marland Kitchen Rosacea    Past Surgical History:  Procedure Laterality Date  . APPENDECTOMY  1958  . CHOLECYSTECTOMY  1958  . COLONOSCOPY    . FEMUR IM NAIL Right 05/01/2014   Procedure: INTRAMEDULLARY (IM) RETROGRADE FEMORAL NAILING;  Surgeon: Johnny Bridge, MD;  Location: [facility];  Service: Orthopedics;  Laterality: Right;  . FRACTURE SURGERY    . JOINT REPLACEMENT    . POLYPECTOMY    . REPLACEMENT TOTAL KNEE BILATERAL  2011 & 2012  .  TONSILLECTOMY     Social History   Socioeconomic History  . Marital status: Divorced    Spouse name: Not on file  . Number of children: 4  . Years of education: 31  . Highest education level: Not on file  Occupational History  . Occupation: Presenter, broadcasting: OTHER    Comment: Librarian, academic   Tobacco Use  . Smoking status: Former Smoker    Types: Cigarettes  . Smokeless tobacco: Never Used  . Tobacco comment: quit smoking in the early 80's  Substance and Sexual Activity  . Alcohol use: No  . Drug use: No  . Sexual activity: Not Currently  Other Topics Concern  . Not on file  Social History Narrative   Marital Status: Divorced   Children:  G-4 P-4   Pets: None     Living Situation: Lives alone   Occupation: Part- Time Data processing manager)   Education: Programmer, systems   Tobacco Use/Exposure:  She smoked 1 ppd for about 20 years.  She quit smoking 30 years ago.     Alcohol Use:  None   Drug Use:  None   Diet:  Regular- Weight Watchers   Exercise:  Walking, Pilates ([employer])    Hobbies: Firefighter, Teaching laboratory technician         Social Determinants  of Health   Financial Resource Strain:   . Difficulty of Paying Living Expenses:   Food Insecurity:   . Worried About Charity fundraiser in the Last Year:   . Arboriculturist in the Last Year:   Transportation Needs:   . Film/video editor (Medical):   Marland Kitchen Lack of Transportation (Non-Medical):   Physical Activity:   . Days of Exercise per Week:   . Minutes of Exercise per Session:   Stress:   . Feeling of Stress :   Social Connections:   . Frequency of Communication with Friends and Family:   . Frequency of Social Gatherings with Friends and Family:   . Attends Religious Services:   . Active Member of Clubs or Organizations:   . Attends Archivist Meetings:   Marland Kitchen Marital Status:    Family History  Problem Relation Age of Onset  . COPD Father   . Asthma Father   . Asthma Sister   . Colon cancer  Neg Hx   . Stomach cancer Neg Hx    No Known Allergies   Positive ROS: All other systems have been reviewed and were otherwise negative with the exception of those mentioned in the HPI and as above.  Physical Exam: General: Alert, no acute distress, laying in bed.  Cardiovascular: No pedal edema Respiratory: No cyanosis, no use of accessory musculature GI: No organomegaly, abdomen is soft and non-tender Skin: No lesions over right hip.  She has a abrasion over her right thigh.  Her right upper extremity is currently wrapped in a sugar tong splint. Neurologic: Sensation intact distally Psychiatric: Patient is competent for consent with normal mood and affect Lymphatic: No axillary or cervical lymphadenopathy  MUSCULOSKELETAL: RUE: RUE in sugar tong splint. Able to flex, extend, and abduct all fingers of right hand. Distal sensation and capillary refill intact. Not significant swelling to right hand.  RLE: Positive pain to palpation over the right hip, positive logroll. EHL and FHL intact able to dorsiflex and plantarflex without pain. Able to move all toes of right foot. Distal sensation intact. 2+DP pulse.   Assessment/Plan: Right periprosthetic intertrochanteric hip fracture, complex, with displacement of the lesser trochanter, around a pre-existing retrograde femoral nail that goes through a total knee prosthesis.  Acute right distal radius fracture, with significant ulnar positive changes that appear somewhat chronic to me, not sure if she had a previous distal radius fracture that had shortening, and then now has a slightly increased angulation, but it does not look terrible.   These are an acute complicated constellation of injuries, and is going to require inpatient management.  I think her distal radius fracture can be treated nonoperatively, I may have a discussion with our hand colleagues just to be sure, however her right hip fracture is going to require surgical intervention,  and I am going to recommend intervention with one of our traumatology subspecialists.  She is going to need removal of the retrograde femoral nail, and then conversion to an antegrade nail.  She is going to be admitted to the medical service, with preoperative medical optimization, Covid testing, nonweightbearing right lower extremity, preparation for surgery which will likely be on Monday.  The risks benefits and alternatives were discussed with the patient and family including but not limited to the risks of nonoperative treatment, versus surgical intervention including infection, bleeding, nerve injury, malunion, nonunion, the need for revision surgery, hardware prominence, hardware failure, the need for hardware removal, blood clots,  cardiopulmonary complications, morbidity, mortality, among others, and they were willing to proceed.     Ventura Bruns, PA-C Cell [phone]   04/17/2020 7:40 PM   Patient seen and examined, I participated in the care, executed all the discussions above, and agree with the plan as stated.  Johnny Bridge, MD

## 2020-04-17 NOTE — H&P (Signed)
History and Physical    Savannah Coleman [id number] DOB: 02-10-1931 DOA: 04/17/2020  PCP: Jonathon Resides, MD  Patient coming from: Home  I have personally briefly reviewed patient's old medical records in [place]  Chief Complaint: Fall, wrist and hip pain  HPI: Savannah Coleman is a 86 y.o. female with medical history significant of HTN.  Prior R femur fx in 2015.  Pt presents to ED after she slipped and fell in shower.  Pain to both wrists and R femur.  Unable to bear wait, pain worse with movement.  Couldn't bend or walk R leg.   ED Course: R wrist fx, and a much more complicated R perihardware lesser trochanter fracture of femur.   Review of Systems: As per HPI, otherwise all review of systems negative.  Past Medical History:  Diagnosis Date  . Adenomatous colon polyp 2008  . Arthritis    knees  . Blood transfusion 03/2011  . Blood transfusion without reported diagnosis   . Closed fracture of left distal radius 04/17/2020  . Closed intertrochanteric fracture of hip, right, initial encounter ([place]) 04/17/2020  . Colon polyps   . Family history of anesthesia complication    " my son aspirated "  . GERD (gastroesophageal reflux disease)   . History of shingles   . Hyperlipidemia   . Hypertension   . 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  Marland Kitchen Rosacea     Past Surgical History:  Procedure Laterality Date  . APPENDECTOMY  1958  . CHOLECYSTECTOMY  1958  . COLONOSCOPY    . FEMUR IM NAIL Right 05/01/2014   Procedure: INTRAMEDULLARY (IM) RETROGRADE FEMORAL NAILING;  Surgeon: Johnny Bridge, MD;  Location: [facility];  Service: Orthopedics;  Laterality: Right;  . FRACTURE SURGERY    . JOINT REPLACEMENT    . POLYPECTOMY    . REPLACEMENT TOTAL KNEE BILATERAL  2011 & 2012  . TONSILLECTOMY       reports that she has quit smoking. Her smoking use included cigarettes. She has never used smokeless tobacco. She  reports that she does not drink alcohol or use drugs.  No Known Allergies  Family History  Problem Relation Age of Onset  . COPD Father   . Asthma Father   . Asthma Sister   . Colon cancer Neg Hx   . Stomach cancer Neg Hx      Prior to Admission medications   Medication Sig Start Date End Date Taking? Authorizing Provider  amLODipine (NORVASC) 5 MG tablet Take 5 mg by mouth daily.    Yes [provider]  diphenhydramine-acetaminophen (TYLENOL PM) 25-500 MG TABS tablet Take 1 tablet by mouth at bedtime as needed (sleep).   Yes [provider]  Multiple Vitamin (MULTIVITAMIN WITH MINERALS) TABS tablet Take 1 tablet by mouth daily with lunch.   Yes [provider]  pantoprazole (PROTONIX) 40 MG tablet Take 40 mg by mouth daily. 03/18/20  Yes [provider]  simvastatin (ZOCOR) 20 MG tablet Take 20 mg by mouth at bedtime. 04/17/20  Yes [provider]  Multiple Vitamins-Minerals (CENTRUM PO) Take 1 tablet by mouth daily.    [provider]  simvastatin (ZOCOR) 80 MG tablet Take 40 mg by mouth at bedtime.    [provider]    Physical Exam: Vitals:   04/17/20 1700 04/17/20 1715 04/17/20 1730 04/17/20 1745  BP:  (!) 147/84 (!) 144/82 (!) 147/84  Pulse: 82 84 87 88  Resp: 15 16 15 12   Temp:      TempSrc:      SpO2: 96% 90% 94% 91%  Weight:      Height:        Constitutional: NAD, calm, comfortable Eyes: PERRL, lids and conjunctivae normal ENMT: Mucous membranes are moist. Posterior pharynx clear of any exudate or lesions.Normal dentition.  Neck: normal, supple, no masses, no thyromegaly Respiratory: clear to auscultation bilaterally, no wheezing, no crackles. Normal respiratory effort. No accessory muscle use.  Cardiovascular: Regular rate and rhythm, no murmurs / rubs / gallops. No extremity edema. 2+ pedal pulses. No carotid bruits.  Abdomen: no tenderness, no masses palpated. No hepatosplenomegaly. Bowel sounds  positive.  Musculoskeletal: RLE rotated, hip TTP Skin: no rashes, lesions, ulcers. No induration Neurologic: CN 2-12 grossly intact. Sensation intact, DTR normal. Strength 5/5 in all 4.  Psychiatric: Normal judgment and insight. Alert and oriented x 3. Normal mood.    Labs on Admission: I have personally reviewed following labs and imaging studies  CBC: Recent Labs  Lab 04/17/20 1600  WBC 11.9*  NEUTROABS 9.7*  HGB 12.2  HCT 38.1  MCV 101.1*  PLT [id number]   Basic Metabolic Panel: Recent Labs  Lab 04/17/20 1600  NA 141  K 3.7  CL 104  CO2 23  GLUCOSE 114*  BUN 14  CREATININE 0.71  CALCIUM 9.3   GFR: Estimated Creatinine Clearance: 39.9 mL/min (by C-G formula based on SCr of 0.71 mg/dL). Liver Function Tests: Recent Labs  Lab 04/17/20 1600  AST 43*  ALT 23  ALKPHOS 56  BILITOT 0.9  PROT 6.4*  ALBUMIN 3.9   No results for input(s): LIPASE, AMYLASE in the last 168 hours. No results for input(s): AMMONIA in the last 168 hours. Coagulation Profile: No results for input(s): INR, PROTIME in the last 168 hours. Cardiac Enzymes: No results for input(s): CKTOTAL, CKMB, CKMBINDEX, TROPONINI in the last 168 hours. BNP (last 3 results) No results for input(s): PROBNP in the last 8760 hours. HbA1C: No results for input(s): HGBA1C in the last 72 hours. CBG: No results for input(s): GLUCAP in the last 168 hours. Lipid Profile: No results for input(s): CHOL, HDL, LDLCALC, TRIG, CHOLHDL, LDLDIRECT in the last 72 hours. Thyroid Function Tests: No results for input(s): TSH, T4TOTAL, FREET4, T3FREE, THYROIDAB in the last 72 hours. Anemia Panel: No results for input(s): VITAMINB12, FOLATE, FERRITIN, TIBC, IRON, RETICCTPCT in the last 72 hours. Urine analysis:    Component Value Date/Time   COLORURINE YELLOW 12/12/2015 1057   APPEARANCEUR CLOUDY (A) 12/12/2015 1057   LABSPEC 1.015 12/12/2015 1057   PHURINE 5.0 12/12/2015 1057   GLUCOSEU NEGATIVE 12/12/2015 1057   HGBUR  TRACE (A) 12/12/2015 1057   BILIRUBINUR NEGATIVE 12/12/2015 1057   BILIRUBINUR small 01/17/2015 1139   KETONESUR NEGATIVE 12/12/2015 1057   PROTEINUR NEGATIVE 12/12/2015 1057   UROBILINOGEN 1.0 01/17/2015 1139   UROBILINOGEN 0.2 05/02/2014 1652   NITRITE NEGATIVE 12/12/2015 1057   LEUKOCYTESUR NEGATIVE 12/12/2015 1057    Radiological Exams on Admission: DG Wrist Complete Left  Result Date: 04/17/2020 CLINICAL DATA:  Slip and fall, pain EXAM: LEFT WRIST - COMPLETE 3+ VIEW COMPARISON:  None. FINDINGS: Osteopenia. No fracture or dislocation of the left wrist. The carpus is normally aligned. Generally mild arthrosis. Soft tissues are unremarkable. IMPRESSION: Osteopenia. No fracture or dislocation of the left wrist. The carpus is normally aligned. Generally mild arthrosis.  Electronically Signed   By: Eddie Candle M.D.   On: 04/17/2020 16:13   DG Wrist Complete Right  Result Date: 04/17/2020 CLINICAL DATA:  Fall at home.  Complaining of wrist pain. EXAM: RIGHT WRIST - COMPLETE 3+ VIEW COMPARISON:  None. FINDINGS: Osteopenia. There is a mildly displaced transverse fracture at the radial head best appreciated on the lateral view. There are degenerative changes in the carpal bones. No additional acute finding identified. No evidence of dislocation. There is regional soft tissue swelling. IMPRESSION: Mildly displaced transverse fracture at the right radial head. Electronically Signed   By: Audie Pinto M.D.   On: 04/17/2020 16:06   CT Head Wo Contrast  Result Date: 04/17/2020 CLINICAL DATA:  Acute pain due to trauma. Headache. Laceration to the right forehead status post fall. EXAM: CT HEAD WITHOUT CONTRAST TECHNIQUE: Contiguous axial images were obtained from the base of the skull through the vertex without intravenous contrast. COMPARISON:  None. FINDINGS: Brain: No evidence of acute infarction, hemorrhage, hydrocephalus, extra-axial collection or mass lesion/mass effect. Atrophy and chronic  microvascular ischemic changes are noted. Vascular: No hyperdense vessel or unexpected calcification. Skull: Normal. Negative for fracture or focal lesion. Sinuses/Orbits: No acute finding. Other: None. IMPRESSION: 1. No acute intracranial abnormality. 2. Atrophy and chronic microvascular ischemic changes. Electronically Signed   By: Constance Holster M.D.   On: 04/17/2020 18:27   DG Hip Unilat  With Pelvis 2-3 Views Right  Result Date: 04/17/2020 CLINICAL DATA:  Fall at home. Right pain hip which shortening and rotation. EXAM: DG HIP (WITH OR WITHOUT PELVIS) 2-3V RIGHT COMPARISON:  Right hip radiographs 05/01/2014 FINDINGS: Partially visualized intramedullary rod in the right femur with proximal threaded screw. There is an avulsion fracture of the lesser trochanter which is new from the 2015 radiograph. There is no evidence of hip dislocation. There are bilateral degenerative changes of the hip. No additional acute finding in the visualized pelvis. Lower lumbar degenerative changes. IMPRESSION: Avulsion fracture of the lesser trochanter of the right femur at the level of the proximal threaded screw. Electronically Signed   By: Audie Pinto M.D.   On: 04/17/2020 16:03    EKG: Independently reviewed.  Assessment/Plan Principal Problem:   Closed avulsion fracture of lesser trochanter of femur, right, initial encounter [facility]) Active Problems:   Hypertension   Fracture of right wrist   Closed fracture of right distal radius    1. Closed peri-hardware avulsion fx of lesser trochanter of R femur - 1. Hip fx pathway 2. See Dr. Mardelle Matte note 3. Surgery planned for Monday: 1. Dose of DVT ppx lovenox tonight 2. May eat until [place] Monday morning 4. CXR pending 5. Pain ctrl per pathway 2. R distal radius fx - 1. Dr. Doreatha Martin will speak with hand, but probably non-op 3. HTN - 1. Cont amlodipine 4. HLD - cont statin  DVT prophylaxis: Lovenox / SCDs Code Status: Full Family Communication: Daughter  at bedside Disposition Plan: SNF / Rehab after femur fx repaired Consults called: Dr. Mardelle Matte Admission status: Admit to inpatient  Severity of Illness: The appropriate patient status for this patient is INPATIENT. Inpatient status is judged to be reasonable and necessary in order to provide the required intensity of service to ensure the patient's safety. The patient's presenting symptoms, physical exam findings, and initial radiographic and laboratory data in the context of their chronic comorbidities is felt to place them at high risk for further clinical deterioration. Furthermore, it is not anticipated that the patient will be  medically stable for discharge from the hospital within 2 midnights of admission. The following factors support the patient status of inpatient.   IP status for operative repair of complicated hip fx.   * I certify that at the point of admission it is my clinical judgment that the patient will require inpatient hospital care spanning beyond 2 midnights from the point of admission due to high intensity of service, high risk for further deterioration and high frequency of surveillance required.*    Friend Dorfman M. DO [facility]  How to contact the [facility] Attending or Consulting provider [place] or covering provider during after hours [place], for this patient?  1. Check the care team in [facility] and look for a) attending/consulting [facility] provider listed and b) the [facility] team listed 2. Log into [email or web address]  Amion Physician Scheduling and messaging for groups and whole hospitals  On call and physician scheduling software for group practices, residents, hospitalists and other medical providers for call, clinic, rotation and shift schedules. On[employer] is a hospital-wide system for scheduling doctors and paging doctors on call. EasyPlot is for scientific plotting and data analysis.  [email or web address]  and use [place]'s universal password to access. If you do not  have the password, please contact the hospital operator.  3. Locate the [facility] provider you are looking for under [facility] and page to a number that you can be directly reached. 4. If you still have difficulty reaching the provider, please page the [facility] (Director on Call) for the Hospitalists listed on amion for assistance.  04/17/2020, 8:50 PM

## 2020-04-17 NOTE — ED Triage Notes (Addendum)
Pt to triage via [facility] from home.  Pt slipped and fell in shower.  C/o R wrist pain, 1-2 inch laceration to R forehead, and R hip pain with shortening and rotation.  Denies LOC.  No blood thinners.  EMS administered Fentanyl 150 mcg and Zofran 4mg .  O2 sats 87% per EMS.  Pt placed on 2L O2 via [place].  C-collar in place on arrival.

## 2020-04-18 LAB — BASIC METABOLIC PANEL
Anion gap: 11 (ref 5–15)
BUN: 13 mg/dL (ref 8–23)
CO2: 25 mmol/L (ref 22–32)
Calcium: 9.4 mg/dL (ref 8.9–10.3)
Chloride: 103 mmol/L (ref 98–111)
Creatinine, Ser: 0.84 mg/dL (ref 0.44–1.00)
GFR calc Af Amer: >60 mL/min (ref >60)
GFR calc non Af Amer: >60 mL/min (ref >60)
Glucose, Bld: 126 mg/dL — ABNORMAL HIGH (ref 70–99)
Potassium: 4.1 mmol/L (ref 3.5–5.1)
Sodium: 139 mmol/L (ref 135–145)

## 2020-04-18 LAB — CBC
HCT: 32.7 % — ABNORMAL LOW (ref 36.0–46.0)
Hemoglobin: 10.9 g/dL — ABNORMAL LOW (ref 12.0–15.0)
MCH: 33.5 pg (ref 26.0–34.0)
MCHC: 33.3 g/dL (ref 30.0–36.0)
MCV: 100.6 fL — ABNORMAL HIGH (ref 80.0–100.0)
Platelets: 207 10*3/uL (ref 150–400)
RBC: 3.25 MIL/uL — ABNORMAL LOW (ref 3.87–5.11)
RDW: 13.4 % (ref 11.5–15.5)
WBC: 9.9 10*3/uL (ref 4.0–10.5)
nRBC: 0 % (ref 0.0–0.2)

## 2020-04-18 LAB — SURGICAL PCR SCREEN
MRSA, PCR: NEGATIVE
Staphylococcus aureus: NEGATIVE

## 2020-04-18 MED ORDER — HYDROMORPHONE HCL 1 MG/ML IJ SOLN
0.5000 mg | INTRAMUSCULAR | Status: DC | PRN
  Administered 2020-04-19: 1 mg via INTRAVENOUS
  Filled 2020-04-18: qty 1

## 2020-04-18 MED ORDER — ONDANSETRON HCL 4 MG/2ML IJ SOLN
4.0000 mg | Freq: Four times a day (QID) | INTRAMUSCULAR | Status: DC | PRN
  Administered 2020-04-20 – 2020-04-22 (×3): 4 mg via INTRAVENOUS
  Filled 2020-04-18 (×5): qty 2

## 2020-04-18 MED ORDER — ONDANSETRON HCL 4 MG/2ML IJ SOLN
INTRAMUSCULAR | Status: AC
  Administered 2020-04-18: 4 mg
  Filled 2020-04-18: qty 2

## 2020-04-18 MED ORDER — PANTOPRAZOLE SODIUM 40 MG PO TBEC
40.0000 mg | DELAYED_RELEASE_TABLET | Freq: Every day | ORAL | Status: DC
  Administered 2020-04-18 – 2020-05-01 (×13): 40 mg via ORAL
  Filled 2020-04-18 (×13): qty 1

## 2020-04-18 MED ORDER — METOCLOPRAMIDE HCL 5 MG/ML IJ SOLN
5.0000 mg | Freq: Three times a day (TID) | INTRAMUSCULAR | Status: DC | PRN
  Administered 2020-04-18: 10 mg via INTRAVENOUS
  Filled 2020-04-18: qty 2

## 2020-04-18 NOTE — Progress Notes (Signed)
PROGRESS NOTE  Savannah Coleman [id number] DOB: [birth year] DOA: 04/17/2020 PCP: Jonathon Resides, MD  HPI/Recap of past 24 hours: HPI from Dr Evon Slack is a 86 y.o. female with medical history significant of HTN, prior R femur fx in 2015. Pt presents to ED after she slipped and fell in shower.  Pain to both wrists and R femur. Unable to bear wait, pain worse with movement.  Couldn't bend or walk R leg. In the ED, noted a R wrist fx, and a much more complicated R perihardware lesser trochanter fracture of femur.  EDP consulted orthopedics.  Patient admitted for further management.    Today, patient reports generalized pain all over, worse around her right upper and lower extremity.  Patient denies any chest pain, abdominal pain, nausea/vomiting, fever/chills, shortness of breath.  Daughter at bedside.  Assessment/Plan: Principal Problem:   Closed avulsion fracture of lesser trochanter of femur, right, initial encounter [facility]) Active Problems:   Hypertension   Closed fracture of right distal radius   Fracture of lesser trochanter of femur on the right S/p mechanical fall X-ray shows above CT head/cervical spine showed no acute fracture or intracranial abnormality Orthopedics on board, plan for surgery on 04/19/2020 Pain management, DVT PPx as per orthopedics  Right wrist fracture X-ray showed mildly displaced transverse fracture of the right radial head Orthopedics consulted, appreciate recs  Macrocytic anemia Anemia panel pending Daily CBC  Hypertension Continue home amlodipine  Hyperlipidemia Continue home statin  GERD Continue PPI       Malnutrition Type:      Malnutrition Characteristics:      Nutrition Interventions:       Estimated body mass index is 23.05 kg/m as calculated from the following:   Height as of this encounter: 5\' 2"  (1.575 m).   Weight as of this encounter: 57.2 kg.     Code Status: Full  Family Communication:  Daughter at bedside  Disposition Plan: Status is: Inpatient  Remains inpatient appropriate because:Inpatient level of care appropriate due to severity of illness   Dispo: The patient is from: Home              Anticipated d/c is to: SNF              Anticipated d/c date is: 2 days              Patient currently is not medically stable to d/c.  Awaiting surgery on 04/19/2020    Consultants:  Orthopedics  Procedures:  None  Antimicrobials:  None  DVT prophylaxis: Lovenox-hold pending surgery   Objective: Vitals:   04/17/20 2100 04/17/20 2130 04/18/20 0425 04/18/20 0432  BP: (!) 149/89 (!) 155/83 138/90   Pulse: 92 84 70   Resp: 19 (!) 24 20   Temp:   98 F (36.7 C)   TempSrc:    Oral  SpO2: 92% 93% 99%   Weight:      Height:       No intake or output data in the 24 hours ending 04/18/20 1159 Filed Weights   04/17/20 1424  Weight: 57.2 kg    Exam:  General: NAD, laceration noted on R side of face  Cardiovascular: S1, S2 present  Respiratory: CTAB  Abdomen: Soft, nontender, nondistended, bowel sounds present  Musculoskeletal: No bilateral pedal edema noted, right upper extremity wrapped in a splint  Skin: Normal  Psychiatry: Normal mood   Data Reviewed: CBC: Recent Labs  Lab 04/17/20 1600  04/18/20 0349  WBC 11.9* 9.9  NEUTROABS 9.7*  --   HGB 12.2 10.9*  HCT 38.1 32.7*  MCV 101.1* 100.6*  PLT 219 [id number]   Basic Metabolic Panel: Recent Labs  Lab 04/17/20 1600 04/18/20 0349  NA 141 139  K 3.7 4.1  CL 104 103  CO2 23 25  GLUCOSE 114* 126*  BUN 14 13  CREATININE 0.71 0.84  CALCIUM 9.3 9.4   GFR: Estimated Creatinine Clearance: 38 mL/min (by C-G formula based on SCr of 0.84 mg/dL). Liver Function Tests: Recent Labs  Lab 04/17/20 1600  AST 43*  ALT 23  ALKPHOS 56  BILITOT 0.9  PROT 6.4*  ALBUMIN 3.9   No results for input(s): LIPASE, AMYLASE in the last 168 hours. No results for input(s): AMMONIA in the last 168  hours. Coagulation Profile: No results for input(s): INR, PROTIME in the last 168 hours. Cardiac Enzymes: No results for input(s): CKTOTAL, CKMB, CKMBINDEX, TROPONINI in the last 168 hours. BNP (last 3 results) No results for input(s): PROBNP in the last 8760 hours. HbA1C: No results for input(s): HGBA1C in the last 72 hours. CBG: No results for input(s): GLUCAP in the last 168 hours. Lipid Profile: No results for input(s): CHOL, HDL, LDLCALC, TRIG, CHOLHDL, LDLDIRECT in the last 72 hours. Thyroid Function Tests: No results for input(s): TSH, T4TOTAL, FREET4, T3FREE, THYROIDAB in the last 72 hours. Anemia Panel: No results for input(s): VITAMINB12, FOLATE, FERRITIN, TIBC, IRON, RETICCTPCT in the last 72 hours. Urine analysis:    Component Value Date/Time   COLORURINE YELLOW 12/12/2015 1057   APPEARANCEUR CLOUDY (A) 12/12/2015 1057   LABSPEC 1.015 12/12/2015 1057   PHURINE 5.0 12/12/2015 1057   GLUCOSEU NEGATIVE 12/12/2015 1057   HGBUR TRACE (A) 12/12/2015 1057   BILIRUBINUR NEGATIVE 12/12/2015 1057   BILIRUBINUR small 01/17/2015 1139   KETONESUR NEGATIVE 12/12/2015 1057   PROTEINUR NEGATIVE 12/12/2015 1057   UROBILINOGEN 1.0 01/17/2015 1139   UROBILINOGEN 0.2 05/02/2014 1652   NITRITE NEGATIVE 12/12/2015 1057   LEUKOCYTESUR NEGATIVE 12/12/2015 1057   Sepsis Labs: @LABRCNTIP (procalcitonin:4,lacticidven:4)  ) Recent Results (from the past 240 hour(s))  Respiratory Panel by RT PCR (Flu A&B, Covid) - Nasopharyngeal Swab     Status: None   Collection Time: 04/17/20  4:48 PM   Specimen: Nasopharyngeal Swab  Result Value Ref Range Status   SARS Coronavirus 2 by RT PCR NEGATIVE NEGATIVE Final    Comment: (NOTE) SARS-CoV-2 target nucleic acids are NOT DETECTED. The SARS-CoV-2 RNA is generally detectable in upper respiratoy specimens during the acute phase of infection. The lowest concentration of SARS-CoV-2 viral copies this assay can detect is 131 copies/mL. A negative  result does not preclude SARS-Cov-2 infection and should not be used as the sole basis for treatment or other patient management decisions. A negative result may occur with  improper specimen collection/handling, submission of specimen other than nasopharyngeal swab, presence of viral mutation(s) within the areas targeted by this assay, and inadequate number of viral copies (<131 copies/mL). A negative result must be combined with clinical observations, patient history, and epidemiological information. The expected result is Negative. Fact Sheet for Patients:  PinkCheek.be Fact Sheet for Healthcare Providers:  GravelBags.it This test is not yet ap proved or cleared by the [place] FDA and  has been authorized for detection and/or diagnosis of SARS-CoV-2 by FDA under an Emergency Use Authorization (EUA). This EUA will remain  in effect (meaning this test can be used) for the duration of the COVID-19 declaration  under Section 564(b)(1) of the Act, 21 U.S.C. section 360bbb-3(b)(1), unless the authorization is terminated or revoked sooner.    Influenza A by PCR NEGATIVE NEGATIVE Final   Influenza B by PCR NEGATIVE NEGATIVE Final    Comment: (NOTE) The Xpert Xpress SARS-CoV-2/FLU/RSV assay is intended as an aid in  the diagnosis of influenza from Nasopharyngeal swab specimens and  should not be used as a sole basis for treatment. Nasal washings and  aspirates are unacceptable for Xpert Xpress SARS-CoV-2/FLU/RSV  testing. Fact Sheet for Patients: PinkCheek.be Fact Sheet for Healthcare Providers: GravelBags.it This test is not yet approved or cleared by the [place] FDA and  has been authorized for detection and/or diagnosis of SARS-CoV-2 by  FDA under an Emergency Use Authorization (EUA). This EUA will remain  in effect (meaning this test can be used) for the  duration of the  Covid-19 declaration under Section 564(b)(1) of the Act, 21  U.S.C. section 360bbb-3(b)(1), unless the authorization is  terminated or revoked. Performed at [facility], [place] [address], [place], [place] [address]   Surgical PCR screen     Status: None   Collection Time: 04/17/20 10:31 PM   Specimen: Nasal Mucosa; Nasal Swab  Result Value Ref Range Status   MRSA, PCR NEGATIVE NEGATIVE Final   Staphylococcus aureus NEGATIVE NEGATIVE Final    Comment: (NOTE) The Xpert SA Assay (FDA approved for NASAL specimens in patients 6 years of age and older), is one component of a comprehensive surveillance program. It is not intended to diagnose infection nor to guide or monitor treatment. Performed at [facility], [place] [address], [place], [place] [address]       Studies: DG Wrist Complete Left  Result Date: 04/17/2020 CLINICAL DATA:  Slip and fall, pain EXAM: LEFT WRIST - COMPLETE 3+ VIEW COMPARISON:  None. FINDINGS: Osteopenia. No fracture or dislocation of the left wrist. The carpus is normally aligned. Generally mild arthrosis. Soft tissues are unremarkable. IMPRESSION: Osteopenia. No fracture or dislocation of the left wrist. The carpus is normally aligned. Generally mild arthrosis. Electronically Signed   By: Eddie Candle M.D.   On: 04/17/2020 16:13   DG Wrist Complete Right  Result Date: 04/17/2020 CLINICAL DATA:  Fall at home.  Complaining of wrist pain. EXAM: RIGHT WRIST - COMPLETE 3+ VIEW COMPARISON:  None. FINDINGS: Osteopenia. There is a mildly displaced transverse fracture at the radial head best appreciated on the lateral view. There are degenerative changes in the carpal bones. No additional acute finding identified. No evidence of dislocation. There is regional soft tissue swelling. IMPRESSION: Mildly displaced transverse fracture at the right radial head. Electronically Signed   By: Audie Pinto M.D.   On: 04/17/2020 16:06   CT Head Wo  Contrast  Result Date: 04/17/2020 CLINICAL DATA:  Acute pain due to trauma. Headache. Laceration to the right forehead status post fall. EXAM: CT HEAD WITHOUT CONTRAST TECHNIQUE: Contiguous axial images were obtained from the base of the skull through the vertex without intravenous contrast. COMPARISON:  None. FINDINGS: Brain: No evidence of acute infarction, hemorrhage, hydrocephalus, extra-axial collection or mass lesion/mass effect. Atrophy and chronic microvascular ischemic changes are noted. Vascular: No hyperdense vessel or unexpected calcification. Skull: Normal. Negative for fracture or focal lesion. Sinuses/Orbits: No acute finding. Other: None. IMPRESSION: 1. No acute intracranial abnormality. 2. Atrophy and chronic microvascular ischemic changes. Electronically Signed   By: Constance Holster M.D.   On: 04/17/2020 18:27   CT Cervical Spine Wo  Contrast  Result Date: 04/17/2020 CLINICAL DATA:  Acute pain due to trauma EXAM: CT CERVICAL SPINE WITHOUT CONTRAST TECHNIQUE: Multidetector CT imaging of the cervical spine was performed without intravenous contrast. Multiplanar CT image reconstructions were also generated. COMPARISON:  None. FINDINGS: Alignment: Normal. Skull base and vertebrae: No acute fracture. No primary bone lesion or focal pathologic process. Soft tissues and spinal canal: No prevertebral fluid or swelling. No visible canal hematoma. Disc levels: Mild multilevel degenerative changes are noted throughout the cervical spine. These are greatest at the C6-C7 level. Upper chest: Negative. Other: None. IMPRESSION: 1. No acute fracture or subluxation of the cervical spine. 2. Mild multilevel degenerative changes. Electronically Signed   By: Constance Holster M.D.   On: 04/17/2020 18:34   Chest Portable 1 View  Result Date: 04/17/2020 CLINICAL DATA:  Preoperative evaluation, former smoker EXAM: PORTABLE CHEST 1 VIEW COMPARISON:  Portable exam 2019 hours compared to 12/12/2015 FINDINGS: Normal  heart size and pulmonary vascularity. Tortuous thoracic aorta. Emphysematous and bronchitic changes likely reflecting COPD. No acute infiltrate, pleural effusion or pneumothorax. Bones demineralized. IMPRESSION: Bronchitic and emphysematous likely reflecting COPD. No acute abnormalities. Electronically Signed   By: Lavonia Dana M.D.   On: 04/17/2020 20:32   DG Hip Unilat  With Pelvis 2-3 Views Right  Result Date: 04/17/2020 CLINICAL DATA:  Fall at home. Right pain hip which shortening and rotation. EXAM: DG HIP (WITH OR WITHOUT PELVIS) 2-3V RIGHT COMPARISON:  Right hip radiographs 05/01/2014 FINDINGS: Partially visualized intramedullary rod in the right femur with proximal threaded screw. There is an avulsion fracture of the lesser trochanter which is new from the 2015 radiograph. There is no evidence of hip dislocation. There are bilateral degenerative changes of the hip. No additional acute finding in the visualized pelvis. Lower lumbar degenerative changes. IMPRESSION: Avulsion fracture of the lesser trochanter of the right femur at the level of the proximal threaded screw. Electronically Signed   By: Audie Pinto M.D.   On: 04/17/2020 16:03    Scheduled Meds: . amLODipine  5 mg Oral Daily  . bacitracin   Topical BID  . mupirocin ointment  1 application Nasal BID  . pantoprazole  40 mg Oral Daily  . simvastatin  20 mg Oral QHS    Continuous Infusions:   LOS: 1 day     Alma Friendly, MD [facility]  If 7PM-7AM, please contact night-coverage [email or web address] 04/18/2020, 11:59 AM

## 2020-04-18 NOTE — Progress Notes (Signed)
Subjective:   Patient is alert, oriented. States pain in right hip is moderate. Patient's main concern is that she states she has been feeling nauseated after morphine doses and zofran is not helping. She states she continues to have pain over left wrist.  Denies chest pain, shortness of breath, or calf pain.   Objective:  PE: VITALS:   Vitals:   04/17/20 2130 04/18/20 0425 04/18/20 0432 04/18/20 1445  BP: (!) 155/83 138/90  137/82  Pulse: 84 70  72  Resp: (!) 24 20  18   Temp:  98 F (36.7 C)  98.2 F (36.8 C)  TempSrc:   Oral Oral  SpO2: 93% 99%  99%  Weight:      Height:       General: Elderly female, in no acute distress, oriented x 3 Resp: no use of accessory musculature GI: abdomen non-tender to palpation, non-distended MSK: RUE: RUE in sugar tong splint. Able to flex, extend, and abduct all fingers of right hand. Distal sensation and capillary refill intact. Not significant swelling to right hand.  LUE: No TTP to distal radius or ulna. Ecchymosis on dorsum of left hand. Able to flex, extend, and abduct all fingers of left hand.  RLE: Positive pain to palpation over the right hip, positive logroll. EHL and FHL intact able to dorsiflex and plantarflex without pain. Able to move all toes of right foot. Distal sensation intact. 2+DP pulse.   LABS  Results for orders placed or performed during the hospital encounter of 04/17/20 (from the past 24 hour(s))  Surgical PCR screen     Status: None   Collection Time: 04/17/20 10:31 PM   Specimen: Nasal Mucosa; Nasal Swab  Result Value Ref Range   MRSA, PCR NEGATIVE NEGATIVE   Staphylococcus aureus NEGATIVE NEGATIVE  CBC     Status: Abnormal   Collection Time: 04/18/20  3:49 AM  Result Value Ref Range   WBC 9.9 4.0 - 10.5 K/uL   RBC 3.25 (L) 3.87 - 5.11 MIL/uL   Hemoglobin 10.9 (L) 12.0 - 15.0 g/dL   HCT 32.7 (L) 36.0 - 46.0 %   MCV 100.6 (H) 80.0 - 100.0 fL   MCH 33.5 26.0 - 34.0 pg   MCHC 33.3 30.0 - 36.0 g/dL   RDW  13.4 11.5 - 15.5 %   Platelets 207 150 - 400 K/uL   nRBC 0.0 0.0 - 0.2 %  Basic metabolic panel     Status: Abnormal   Collection Time: 04/18/20  3:49 AM  Result Value Ref Range   Sodium 139 135 - 145 mmol/L   Potassium 4.1 3.5 - 5.1 mmol/L   Chloride 103 98 - 111 mmol/L   CO2 25 22 - 32 mmol/L   Glucose, Bld 126 (H) 70 - 99 mg/dL   BUN 13 8 - 23 mg/dL   Creatinine, Ser 0.84 0.44 - 1.00 mg/dL   Calcium 9.4 8.9 - 10.3 mg/dL   GFR calc non Af Amer >60 >60 mL/min   GFR calc Af Amer >60 >60 mL/min   Anion gap 11 5 - 15    DG Wrist Complete Left  Result Date: 04/17/2020 CLINICAL DATA:  Slip and fall, pain EXAM: LEFT WRIST - COMPLETE 3+ VIEW COMPARISON:  None. FINDINGS: Osteopenia. No fracture or dislocation of the left wrist. The carpus is normally aligned. Generally mild arthrosis. Soft tissues are unremarkable. IMPRESSION: Osteopenia. No fracture or dislocation of the left wrist. The carpus is normally aligned. Generally mild  arthrosis. Electronically Signed   By: Eddie Candle M.D.   On: 04/17/2020 16:13   DG Wrist Complete Right  Result Date: 04/17/2020 CLINICAL DATA:  Fall at home.  Complaining of wrist pain. EXAM: RIGHT WRIST - COMPLETE 3+ VIEW COMPARISON:  None. FINDINGS: Osteopenia. There is a mildly displaced transverse fracture at the radial head best appreciated on the lateral view. There are degenerative changes in the carpal bones. No additional acute finding identified. No evidence of dislocation. There is regional soft tissue swelling. IMPRESSION: Mildly displaced transverse fracture at the right radial head. Electronically Signed   By: Audie Pinto M.D.   On: 04/17/2020 16:06   CT Head Wo Contrast  Result Date: 04/17/2020 CLINICAL DATA:  Acute pain due to trauma. Headache. Laceration to the right forehead status post fall. EXAM: CT HEAD WITHOUT CONTRAST TECHNIQUE: Contiguous axial images were obtained from the base of the skull through the vertex without intravenous contrast.  COMPARISON:  None. FINDINGS: Brain: No evidence of acute infarction, hemorrhage, hydrocephalus, extra-axial collection or mass lesion/mass effect. Atrophy and chronic microvascular ischemic changes are noted. Vascular: No hyperdense vessel or unexpected calcification. Skull: Normal. Negative for fracture or focal lesion. Sinuses/Orbits: No acute finding. Other: None. IMPRESSION: 1. No acute intracranial abnormality. 2. Atrophy and chronic microvascular ischemic changes. Electronically Signed   By: Constance Holster M.D.   On: 04/17/2020 18:27   CT Cervical Spine Wo Contrast  Result Date: 04/17/2020 CLINICAL DATA:  Acute pain due to trauma EXAM: CT CERVICAL SPINE WITHOUT CONTRAST TECHNIQUE: Multidetector CT imaging of the cervical spine was performed without intravenous contrast. Multiplanar CT image reconstructions were also generated. COMPARISON:  None. FINDINGS: Alignment: Normal. Skull base and vertebrae: No acute fracture. No primary bone lesion or focal pathologic process. Soft tissues and spinal canal: No prevertebral fluid or swelling. No visible canal hematoma. Disc levels: Mild multilevel degenerative changes are noted throughout the cervical spine. These are greatest at the C6-C7 level. Upper chest: Negative. Other: None. IMPRESSION: 1. No acute fracture or subluxation of the cervical spine. 2. Mild multilevel degenerative changes. Electronically Signed   By: Constance Holster M.D.   On: 04/17/2020 18:34   Chest Portable 1 View  Result Date: 04/17/2020 CLINICAL DATA:  Preoperative evaluation, former smoker EXAM: PORTABLE CHEST 1 VIEW COMPARISON:  Portable exam 2019 hours compared to 12/12/2015 FINDINGS: Normal heart size and pulmonary vascularity. Tortuous thoracic aorta. Emphysematous and bronchitic changes likely reflecting COPD. No acute infiltrate, pleural effusion or pneumothorax. Bones demineralized. IMPRESSION: Bronchitic and emphysematous likely reflecting COPD. No acute abnormalities.  Electronically Signed   By: Lavonia Dana M.D.   On: 04/17/2020 20:32   DG Hip Unilat  With Pelvis 2-3 Views Right  Result Date: 04/17/2020 CLINICAL DATA:  Fall at home. Right pain hip which shortening and rotation. EXAM: DG HIP (WITH OR WITHOUT PELVIS) 2-3V RIGHT COMPARISON:  Right hip radiographs 05/01/2014 FINDINGS: Partially visualized intramedullary rod in the right femur with proximal threaded screw. There is an avulsion fracture of the lesser trochanter which is new from the 2015 radiograph. There is no evidence of hip dislocation. There are bilateral degenerative changes of the hip. No additional acute finding in the visualized pelvis. Lower lumbar degenerative changes. IMPRESSION: Avulsion fracture of the lesser trochanter of the right femur at the level of the proximal threaded screw. Electronically Signed   By: Audie Pinto M.D.   On: 04/17/2020 16:03    Assessment/Plan: Principal Problem:   Closed avulsion fracture of lesser trochanter of  femur, right, initial encounter [facility]) Active Problems:   Hypertension   Closed fracture of right distal radius  Nausea: - patient states she becomes extremely nauseated after morphine doses, but needs something to help control pain, currently using zofran  - discontinued morphine, added dilaudid, will add on reglan as an alternative if she continues to have nausea with use of zofran  Right periprosthetic intertrochanteric hip fracture - plan to go to OR with Dr. Marcelino Scot tomorrow am - NPO after midnight - reviewed surgery with patient and daughter and they agree with plan  Acute right distal radius fracture - Spoke with hand surgery colleagues who recommended non-operative treatment, will continue use of sugar tong splint, may use sling if needed, can follow-up with Dr. Mardelle Matte or Dr. Marcelino Scot outpatient for this injury  Contact information:   Weekdays 8-5 Merlene Pulling, PA-C [phone] A fter hours and holidays please check [email or web address] for group  call information for [facility] 04/18/2020, 7:49 PM

## 2020-04-18 NOTE — Anesthesia Preprocedure Evaluation (Addendum)
Anesthesia Evaluation  Patient identified by MRN, date of birth, ID band Patient awake    Reviewed: Allergy & Precautions, NPO status , Patient's Chart, lab work & pertinent test results  History of Anesthesia Complications Negative for: history of anesthetic complications  Airway Mallampati: III  TM Distance: <3 FB Neck ROM: Full    Dental  (+) Dental Advisory Given   Pulmonary neg pulmonary ROS, former smoker,    Pulmonary exam normal        Cardiovascular hypertension, Pt. on medications Normal cardiovascular exam     Neuro/Psych negative neurological ROS  negative psych ROS   GI/Hepatic Neg liver ROS, GERD  ,  Endo/Other  negative endocrine ROS  Renal/GU negative Renal ROS  negative genitourinary   Musculoskeletal negative musculoskeletal ROS (+)   Abdominal   Peds negative pediatric ROS (+)  Hematology negative hematology ROS (+)   Anesthesia Other Findings Day of surgery medications reviewed with the patient.  Reproductive/Obstetrics negative OB ROS                            Anesthesia Physical Anesthesia Plan  ASA: III  Anesthesia Plan: General   Post-op Pain Management:    Induction: Intravenous  PONV Risk Score and Plan: 4 or greater and Ondansetron, Dexamethasone, Treatment may vary due to age or medical condition and Diphenhydramine  Airway Management Planned: Oral ETT  Additional Equipment:   Intra-op Plan:   Post-operative Plan: Extubation in OR  Informed Consent: I have reviewed the patients History and Physical, chart, labs and discussed the procedure including the risks, benefits and alternatives for the proposed anesthesia with the patient or authorized representative who has indicated his/her understanding and acceptance.     Dental advisory given  Plan Discussed with: Anesthesiologist and CRNA  Anesthesia Plan Comments:        Anesthesia  Quick Evaluation

## 2020-04-18 NOTE — Plan of Care (Signed)
  Problem: Activity: Goal: Ability to ambulate and perform ADLs will improve Outcome: Progressing   Problem: Self-Concept: Goal: Ability to maintain and perform role responsibilities to the fullest extent possible will improve Outcome: Progressing   Problem: Pain Management: Goal: Pain level will decrease Outcome: Progressing   

## 2020-04-19 ENCOUNTER — Inpatient Hospital Stay (HOSPITAL_COMMUNITY): Payer: Medicare HMO | Admitting: Anesthesiology

## 2020-04-19 ENCOUNTER — Encounter (HOSPITAL_COMMUNITY): Payer: Self-pay | Admitting: Internal Medicine

## 2020-04-19 ENCOUNTER — Encounter (HOSPITAL_COMMUNITY)
Admission: EM | Disposition: A | Discharge status: Rehabilitation Facility | Payer: Self-pay | Source: Home / Self Care | Attending: Internal Medicine

## 2020-04-19 ENCOUNTER — Inpatient Hospital Stay (HOSPITAL_COMMUNITY): Payer: Medicare HMO

## 2020-04-19 HISTORY — PX: ORIF WRIST FRACTURE: SHX2133

## 2020-04-19 HISTORY — PX: FEMUR IM NAIL: SHX1597

## 2020-04-19 LAB — BASIC METABOLIC PANEL
Anion gap: 8 (ref 5–15)
BUN: 8 mg/dL (ref 8–23)
CO2: 28 mmol/L (ref 22–32)
Calcium: 9.3 mg/dL (ref 8.9–10.3)
Chloride: 99 mmol/L (ref 98–111)
Creatinine, Ser: 0.75 mg/dL (ref 0.44–1.00)
GFR calc Af Amer: >60 mL/min (ref >60)
GFR calc non Af Amer: >60 mL/min (ref >60)
Glucose, Bld: 173 mg/dL — ABNORMAL HIGH (ref 70–99)
Potassium: 3.4 mmol/L — ABNORMAL LOW (ref 3.5–5.1)
Sodium: 135 mmol/L (ref 135–145)

## 2020-04-19 LAB — IRON AND TIBC
Iron: 33 ug/dL (ref 28–170)
Saturation Ratios: 11 % (ref 10.4–31.8)
TIBC: 295 ug/dL (ref 250–450)
UIBC: 262 ug/dL

## 2020-04-19 LAB — CBC WITH DIFFERENTIAL/PLATELET
Abs Immature Granulocytes: 0.03 10*3/uL (ref 0.00–0.07)
Basophils Absolute: 0 10*3/uL (ref 0.0–0.1)
Basophils Relative: 0 %
Eosinophils Absolute: 0.2 10*3/uL (ref 0.0–0.5)
Eosinophils Relative: 2 %
HCT: 31.6 % — ABNORMAL LOW (ref 36.0–46.0)
Hemoglobin: 10.4 g/dL — ABNORMAL LOW (ref 12.0–15.0)
Immature Granulocytes: 0 %
Lymphocytes Relative: 10 %
Lymphs Abs: 0.9 10*3/uL (ref 0.7–4.0)
MCH: 33.3 pg (ref 26.0–34.0)
MCHC: 32.9 g/dL (ref 30.0–36.0)
MCV: 101.3 fL — ABNORMAL HIGH (ref 80.0–100.0)
Monocytes Absolute: 0.9 10*3/uL (ref 0.1–1.0)
Monocytes Relative: 10 %
Neutro Abs: 7.1 10*3/uL (ref 1.7–7.7)
Neutrophils Relative %: 78 %
Platelets: 144 10*3/uL — ABNORMAL LOW (ref 150–400)
RBC: 3.12 MIL/uL — ABNORMAL LOW (ref 3.87–5.11)
RDW: 13.2 % (ref 11.5–15.5)
WBC: 9.1 10*3/uL (ref 4.0–10.5)
nRBC: 0 % (ref 0.0–0.2)

## 2020-04-19 LAB — VITAMIN B12: Vitamin B-12: 615 pg/mL (ref 180–914)

## 2020-04-19 LAB — FERRITIN: Ferritin: 72 ng/mL (ref 11–307)

## 2020-04-19 LAB — FOLATE: Folate: 24.4 ng/mL (ref >5.9)

## 2020-04-19 SURGERY — INSERTION, INTRAMEDULLARY ROD, FEMUR, RETROGRADE
Anesthesia: General | Site: Wrist | Laterality: Right

## 2020-04-19 MED ORDER — LIDOCAINE 2% (20 MG/ML) 5 ML SYRINGE
INTRAMUSCULAR | Status: DC | PRN
  Administered 2020-04-19: 60 mg via INTRAVENOUS

## 2020-04-19 MED ORDER — FENTANYL CITRATE (PF) 100 MCG/2ML IJ SOLN
25.0000 ug | INTRAMUSCULAR | Status: DC | PRN
  Administered 2020-04-19: 50 ug via INTRAVENOUS

## 2020-04-19 MED ORDER — PHENYLEPHRINE HCL-NACL 10-0.9 MG/250ML-% IV SOLN
INTRAVENOUS | Status: DC | PRN
  Administered 2020-04-19: 30 ug/min via INTRAVENOUS

## 2020-04-19 MED ORDER — CEFAZOLIN SODIUM-DEXTROSE 2-4 GM/100ML-% IV SOLN
INTRAVENOUS | Status: AC
  Filled 2020-04-19: qty 100

## 2020-04-19 MED ORDER — CELECOXIB 200 MG PO CAPS: 200.0000 mg | ORAL_CAPSULE | Freq: Once | ORAL | Status: AC

## 2020-04-19 MED ORDER — SUCCINYLCHOLINE CHLORIDE 200 MG/10ML IV SOSY
PREFILLED_SYRINGE | INTRAVENOUS | Status: DC | PRN
  Administered 2020-04-19: 140 mg via INTRAVENOUS

## 2020-04-19 MED ORDER — LACTATED RINGERS IV SOLN: INTRAVENOUS | Status: DC

## 2020-04-19 MED ORDER — SUGAMMADEX SODIUM 200 MG/2ML IV SOLN
INTRAVENOUS | Status: DC | PRN
Start: 2020-04-19 — End: 2020-04-19
  Administered 2020-04-19: 200 mg via INTRAVENOUS

## 2020-04-19 MED ORDER — HYDROCODONE-ACETAMINOPHEN 5-325 MG PO TABS
ORAL_TABLET | ORAL | Status: AC
  Filled 2020-04-19: qty 2

## 2020-04-19 MED ORDER — 0.9 % SODIUM CHLORIDE (POUR BTL) OPTIME
TOPICAL | Status: DC | PRN
  Administered 2020-04-19: 1000 mL

## 2020-04-19 MED ORDER — POVIDONE-IODINE 10 % EX SWAB: 2.0000 "application " | Freq: Once | CUTANEOUS | Status: DC

## 2020-04-19 MED ORDER — ROCURONIUM BROMIDE 10 MG/ML (PF) SYRINGE
PREFILLED_SYRINGE | INTRAVENOUS | Status: DC | PRN
  Administered 2020-04-19: 50 mg via INTRAVENOUS
  Administered 2020-04-19: 10 mg via INTRAVENOUS

## 2020-04-19 MED ORDER — FENTANYL CITRATE (PF) 100 MCG/2ML IJ SOLN: 50.0000 ug | Freq: Once | INTRAMUSCULAR | Status: AC

## 2020-04-19 MED ORDER — PROPOFOL 10 MG/ML IV BOLUS
INTRAVENOUS | Status: DC | PRN
  Administered 2020-04-19: 140 mg via INTRAVENOUS

## 2020-04-19 MED ORDER — ACETAMINOPHEN 500 MG PO TABS
500.0000 mg | ORAL_TABLET | Freq: Two times a day (BID) | ORAL | Status: DC
  Administered 2020-04-19 – 2020-05-01 (×23): 500 mg via ORAL
  Filled 2020-04-19 (×24): qty 1

## 2020-04-19 MED ORDER — FENTANYL CITRATE (PF) 100 MCG/2ML IJ SOLN
INTRAMUSCULAR | Status: AC
  Administered 2020-04-19: 50 ug via INTRAVENOUS
  Filled 2020-04-19: qty 2

## 2020-04-19 MED ORDER — ENSURE ENLIVE PO LIQD: 237.0000 mL | Freq: Two times a day (BID) | ORAL | Status: DC

## 2020-04-19 MED ORDER — ACETAMINOPHEN 500 MG PO TABS: 1000.0000 mg | ORAL_TABLET | Freq: Once | ORAL | Status: DC

## 2020-04-19 MED ORDER — PHENYLEPHRINE 40 MCG/ML (10ML) SYRINGE FOR IV PUSH (FOR BLOOD PRESSURE SUPPORT)
PREFILLED_SYRINGE | INTRAVENOUS | Status: DC | PRN
  Administered 2020-04-19 (×3): 120 ug via INTRAVENOUS

## 2020-04-19 MED ORDER — FENTANYL CITRATE (PF) 100 MCG/2ML IJ SOLN
INTRAMUSCULAR | Status: AC
  Filled 2020-04-19: qty 2

## 2020-04-19 MED ORDER — CEFAZOLIN SODIUM-DEXTROSE 2-4 GM/100ML-% IV SOLN
2.0000 g | INTRAVENOUS | Status: AC
  Administered 2020-04-19: 2 g via INTRAVENOUS

## 2020-04-19 MED ORDER — ENSURE PRE-SURGERY PO LIQD
296.0000 mL | Freq: Once | ORAL | Status: AC
  Administered 2020-04-19: 296 mL via ORAL
  Filled 2020-04-19: qty 296

## 2020-04-19 MED ORDER — FENTANYL CITRATE (PF) 250 MCG/5ML IJ SOLN
INTRAMUSCULAR | Status: DC | PRN
  Administered 2020-04-19 (×2): 100 ug via INTRAVENOUS

## 2020-04-19 MED ORDER — ADULT MULTIVITAMIN W/MINERALS CH
1.0000 | ORAL_TABLET | Freq: Every day | ORAL | Status: DC
  Administered 2020-04-20 – 2020-05-01 (×12): 1 via ORAL
  Filled 2020-04-19 (×12): qty 1

## 2020-04-19 MED ORDER — DEXAMETHASONE SODIUM PHOSPHATE 10 MG/ML IJ SOLN
INTRAMUSCULAR | Status: DC | PRN
  Administered 2020-04-19: 5 mg via INTRAVENOUS

## 2020-04-19 MED ORDER — ONDANSETRON HCL 4 MG/2ML IJ SOLN
INTRAMUSCULAR | Status: DC | PRN
  Administered 2020-04-19: 4 mg via INTRAVENOUS

## 2020-04-19 MED ORDER — ALBUMIN HUMAN 5 % IV SOLN: INTRAVENOUS | Status: DC | PRN

## 2020-04-19 MED ORDER — CELECOXIB 200 MG PO CAPS
ORAL_CAPSULE | ORAL | Status: AC
  Administered 2020-04-19: 200 mg via ORAL
  Filled 2020-04-19: qty 1

## 2020-04-19 MED ORDER — FENTANYL CITRATE (PF) 250 MCG/5ML IJ SOLN
INTRAMUSCULAR | Status: AC
  Filled 2020-04-19: qty 5

## 2020-04-19 MED ORDER — PROMETHAZINE HCL 25 MG/ML IJ SOLN: 6.2500 mg | INTRAMUSCULAR | Status: DC | PRN

## 2020-04-19 MED ORDER — PROPOFOL 10 MG/ML IV BOLUS
INTRAVENOUS | Status: AC
  Filled 2020-04-19: qty 20

## 2020-04-19 MED ORDER — ONDANSETRON HCL 4 MG/2ML IJ SOLN
4.0000 mg | INTRAMUSCULAR | Status: AC
  Administered 2020-04-19: 4 mg via INTRAVENOUS

## 2020-04-19 SURGICAL SUPPLY — 105 items
BANDAGE ESMARK 6X9 LF (GAUZE/BANDAGES/DRESSINGS) IMPLANT
BIT DRILL 2.2 SS TIBIAL (BIT) ×2 IMPLANT
BIT DRILL 4.3MMS DISTAL GRDTED (BIT) IMPLANT
BNDG COHESIVE 4X5 TAN STRL (GAUZE/BANDAGES/DRESSINGS) ×4 IMPLANT
BNDG COHESIVE 6X5 TAN STRL LF (GAUZE/BANDAGES/DRESSINGS) IMPLANT
BNDG ELASTIC 2X5.8 VLCR STR LF (GAUZE/BANDAGES/DRESSINGS) ×2 IMPLANT
BNDG ELASTIC 3X5.8 VLCR STR LF (GAUZE/BANDAGES/DRESSINGS) ×2 IMPLANT
BNDG ELASTIC 4X5.8 VLCR STR LF (GAUZE/BANDAGES/DRESSINGS) ×4 IMPLANT
BNDG ELASTIC 6X5.8 VLCR STR LF (GAUZE/BANDAGES/DRESSINGS) ×4 IMPLANT
BNDG ESMARK 6X9 LF (GAUZE/BANDAGES/DRESSINGS)
BNDG GAUZE ELAST 4 BULKY (GAUZE/BANDAGES/DRESSINGS) ×2 IMPLANT
BRUSH SCRUB EZ PLAIN DRY (MISCELLANEOUS) ×8 IMPLANT
CATH FOLEY 2WAY SLVR  5CC 14FR (CATHETERS) ×2
CATH FOLEY 2WAY SLVR 5CC 14FR (CATHETERS) IMPLANT
CORD BIPOLAR FORCEPS 12FT (ELECTRODE) ×2 IMPLANT
CORTICAL BONE SCR 5.0MM X 46MM (Screw) ×4 IMPLANT
COVER MAYO STAND STRL (DRAPES) ×4 IMPLANT
COVER PERINEAL POST (MISCELLANEOUS) ×4 IMPLANT
COVER SURGICAL LIGHT HANDLE (MISCELLANEOUS) ×6 IMPLANT
COVER WAND RF STERILE (DRAPES) ×2 IMPLANT
DRAPE C-ARM 42X72 X-RAY (DRAPES) ×6 IMPLANT
DRAPE C-ARMOR (DRAPES) ×4 IMPLANT
DRAPE EXTREMITY ABC'S (DRAPES) ×1
DRAPE EXTREMITY ABCS (DRAPES) ×1 IMPLANT
DRAPE HALF SHEET 40X57 (DRAPES) IMPLANT
DRAPE IMP U-DRAPE 54X76 (DRAPES) ×4 IMPLANT
DRAPE INCISE IOBAN 66X45 STRL (DRAPES) ×4 IMPLANT
DRAPE ORTHO SPLIT 77X108 STRL (DRAPES) ×4
DRAPE SURG ORHT 6 SPLT 77X108 (DRAPES) ×4 IMPLANT
DRAPE U-SHAPE 47X51 STRL (DRAPES) ×4 IMPLANT
DRILL 4.3MMS DISTAL GRADUATED (BIT) ×4
DRSG ADAPTIC 3X8 NADH LF (GAUZE/BANDAGES/DRESSINGS) ×2 IMPLANT
DRSG EMULSION OIL 3X3 NADH (GAUZE/BANDAGES/DRESSINGS) ×2 IMPLANT
DRSG MEPILEX BORDER 4X4 (GAUZE/BANDAGES/DRESSINGS) ×4 IMPLANT
DRSG MEPILEX BORDER 4X8 (GAUZE/BANDAGES/DRESSINGS) ×4 IMPLANT
DRSG PAD ABDOMINAL 8X10 ST (GAUZE/BANDAGES/DRESSINGS) ×2 IMPLANT
ELECT PENCIL ROCKER SW 15FT (MISCELLANEOUS) ×2 IMPLANT
ELECT REM PT RETURN 9FT ADLT (ELECTROSURGICAL) ×4
ELECTRODE REM PT RTRN 9FT ADLT (ELECTROSURGICAL) ×2 IMPLANT
EVACUATOR 1/8 PVC DRAIN (DRAIN) IMPLANT
GAUZE SPONGE 4X4 12PLY STRL (GAUZE/BANDAGES/DRESSINGS) ×6 IMPLANT
GAUZE XEROFORM 1X8 LF (GAUZE/BANDAGES/DRESSINGS) ×2 IMPLANT
GLOVE BIO SURGEON STRL SZ7.5 (GLOVE) ×6 IMPLANT
GLOVE BIO SURGEON STRL SZ8 (GLOVE) ×6 IMPLANT
GLOVE BIOGEL PI IND STRL 7.5 (GLOVE) ×2 IMPLANT
GLOVE BIOGEL PI IND STRL 8 (GLOVE) ×2 IMPLANT
GLOVE BIOGEL PI INDICATOR 7.5 (GLOVE) ×2
GLOVE BIOGEL PI INDICATOR 8 (GLOVE) ×2
GOWN STRL REUS W/ TWL LRG LVL3 (GOWN DISPOSABLE) ×4 IMPLANT
GOWN STRL REUS W/ TWL XL LVL3 (GOWN DISPOSABLE) ×2 IMPLANT
GOWN STRL REUS W/TWL LRG LVL3 (GOWN DISPOSABLE) ×4
GOWN STRL REUS W/TWL XL LVL3 (GOWN DISPOSABLE) ×2
GUIDEPIN 3.2X17.5 THRD DISP (PIN) ×4 IMPLANT
GUIDEWIRE BALL NOSE 100CM (WIRE) ×2 IMPLANT
HFN RH 130 DEG 11MM X 360MM (Orthopedic Implant) ×2 IMPLANT
HIP FRA NAIL LAG SCREW 10.5X90 (Orthopedic Implant) ×4 IMPLANT
K-WIRE 1.6 (WIRE) ×8
K-WIRE FX5X1.6XNS BN SS (WIRE) ×8
KIT BASIN OR (CUSTOM PROCEDURE TRAY) ×4 IMPLANT
KIT TURNOVER KIT B (KITS) ×4 IMPLANT
KWIRE FX5X1.6XNS BN SS (WIRE) IMPLANT
MANIFOLD NEPTUNE II (INSTRUMENTS) ×4 IMPLANT
NS IRRIG 1000ML POUR BTL (IV SOLUTION) ×4 IMPLANT
PACK GENERAL/GYN (CUSTOM PROCEDURE TRAY) ×4 IMPLANT
PACK ORTHO EXTREMITY (CUSTOM PROCEDURE TRAY) ×4 IMPLANT
PACK UNIVERSAL I (CUSTOM PROCEDURE TRAY) ×4 IMPLANT
PAD ARMBOARD 7.5X6 YLW CONV (MISCELLANEOUS) ×8 IMPLANT
PAD CAST 3X4 CTTN HI CHSV (CAST SUPPLIES) IMPLANT
PADDING CAST COTTON 3X4 STRL (CAST SUPPLIES) ×2
PEG LOCKING SMOOTH 2.2X16 (Screw) ×2 IMPLANT
PEG LOCKING SMOOTH 2.2X18 (Peg) ×6 IMPLANT
PEG LOCKING SMOOTH 2.2X20 (Screw) ×6 IMPLANT
PEG LOCKING SMOOTH 2.2X22 (Screw) ×2 IMPLANT
PLATE DVR CROSSLOCK STD RT (Plate) ×2 IMPLANT
SCREW CORTICL BON 5.0MM X 46MM (Screw) IMPLANT
SCREW LAG HIP FRA NAIL 10.5X90 (Orthopedic Implant) IMPLANT
SCREW LOCK 14X2.7X 3 LD TPR (Screw) IMPLANT
SCREW LOCK 18X2.7X 3 LD TPR (Screw) IMPLANT
SCREW LOCKING 2.7X14 (Screw) ×2 IMPLANT
SCREW LOCKING 2.7X15MM (Screw) ×6 IMPLANT
SCREW LOCKING 2.7X18 (Screw) ×2 IMPLANT
SHEET MEDIUM DRAPE 40X70 STRL (DRAPES) ×2 IMPLANT
SPONGE LAP 18X18 RF (DISPOSABLE) ×8 IMPLANT
STAPLER VISISTAT 35W (STAPLE) ×4 IMPLANT
STOCKINETTE IMPERVIOUS LG (DRAPES) ×4 IMPLANT
SUCTION FRAZIER HANDLE 10FR (MISCELLANEOUS) ×2
SUCTION TUBE FRAZIER 10FR DISP (MISCELLANEOUS) IMPLANT
SUT ETHILON 3 0 FSL (SUTURE) ×2 IMPLANT
SUT ETHILON 3 0 PS 1 (SUTURE) ×4 IMPLANT
SUT PROLENE 3 0 PS 2 (SUTURE) ×4 IMPLANT
SUT VIC AB 0 CT1 27 (SUTURE) ×2
SUT VIC AB 0 CT1 27XBRD ANBCTR (SUTURE) ×2 IMPLANT
SUT VIC AB 1 CT1 27 (SUTURE) ×2
SUT VIC AB 1 CT1 27XBRD ANBCTR (SUTURE) ×2 IMPLANT
SUT VIC AB 2-0 CT1 27 (SUTURE) ×4
SUT VIC AB 2-0 CT1 TAPERPNT 27 (SUTURE) ×2 IMPLANT
SUT VIC AB 2-0 CT3 27 (SUTURE) IMPLANT
SUT VIC AB 2-0 FS1 27 (SUTURE) ×2 IMPLANT
TOWEL GREEN STERILE (TOWEL DISPOSABLE) ×6 IMPLANT
TOWEL GREEN STERILE FF (TOWEL DISPOSABLE) ×4 IMPLANT
TRAY FOLEY MTR SLVR 16FR STAT (SET/KITS/TRAYS/PACK) ×2 IMPLANT
TUBE CONNECTING 12'X1/4 (SUCTIONS) ×2
TUBE CONNECTING 12X1/4 (SUCTIONS) ×4 IMPLANT
WATER STERILE IRR 1000ML POUR (IV SOLUTION) ×4 IMPLANT
YANKAUER SUCT BULB TIP NO VENT (SUCTIONS) ×4 IMPLANT

## 2020-04-19 NOTE — Progress Notes (Signed)
Pt's family requesting for Pt to have pain medicine, Pt is alert and oriented x4, rated her pain 2/10 on R arm and no pain 0/10 on R leg. Pt refused to take pain med. Pt and daughters educated on pain medication policy. Endorsed accordingly to night RN.

## 2020-04-19 NOTE — Progress Notes (Signed)
Initial Nutrition Assessment  RD working remotely.  DOCUMENTATION CODES:   Not applicable  INTERVENTION:   Once diet is advanced,:  -Ensure Enlive po BID, each supplement provides 350 kcal and 20 grams of protein -MVI with minerals daily  NUTRITION DIAGNOSIS:   Increased nutrient needs related to post-op healing as evidenced by estimated needs.  GOAL:   Patient will meet greater than or equal to 90% of their needs  MONITOR:   PO intake, Supplement acceptance, Diet advancement, Labs, Weight trends, Skin, I & O's  REASON FOR ASSESSMENT:   Consult Assessment of nutrition requirement/status  ASSESSMENT:   Savannah Coleman is a 86 y.o. female with medical history significant of HTN.  Prior R femur fx in 2015. Admitted s/p fall.  Pt admitted with rt hip fracture and rt distal radius fractures s/p fall.   Reviewed I/O's: +250 ml x 24 hours  Pt down on OR at time of visit. RD unable to obtain further nutrition-related history at this time.   Reviewed wt hx.Per [facility] records, wt has been stable over the past month. Per encounter on 01/01/2019, recorded wt 57.4 kg. Wt has been stable over the past year, however, has had distant history of weight loss.   Pt with increased nutritional needs due to post-operative healing. Pt also with rt handed dominance and may have some difficulties with self feeding. Pt would greatly benefit from oral nutrition supplements.   Medications reviewed and include lactated ringers infusion @ 10 ml/hr.   Labs reviewed: K: 3.4.   Diet Order:   Diet Order            Diet NPO time specified Except for: Sips with Meds  Diet effective midnight              EDUCATION NEEDS:   No education needs have been identified at this time  Skin:  Skin Assessment: Skin Integrity Issues: Skin Integrity Issues:: Other (Comment) Other: facial laceration above rt eye  Last BM:  04/17/20  Height:   Ht Readings from Last 1 Encounters:  04/19/20  5\' 2"  (1.575 m)    Weight:   Wt Readings from Last 1 Encounters:  04/19/20 57.2 kg    Ideal Body Weight:  50 kg  BMI:  Body mass index is 23.05 kg/m.  Estimated Nutritional Needs:   Kcal:  1450-1650  Protein:  70-85 grams  Fluid:  > 1.4 L    Loistine Chance, RD, LDN, [place] Registered Dietitian II Certified Diabetes Care and Education Specialist Please refer to [facility] for RD and/or RD on-call/weekend/after hours pager

## 2020-04-19 NOTE — Progress Notes (Signed)
Pt c/o nausea and 10/10 right hip pain, verbal order received from Dr. Tobias Alexander for zofran 4mg  IV, and 50 mcg Fentanyl IV.

## 2020-04-19 NOTE — Consult Note (Addendum)
Agree to assume care from Dr. Mardelle Matte and have communicated directly with him.  Orthopaedic Trauma Service Consultation  Reason for Consult: Right hip intertroch above tip of femoral nail Referring Physician: Carter Kitten, MD  Savannah Coleman is an 86 y.o. female.  HPI: Patient fell in the shower with complex left hip fracture because of associated femoral nail which is adjacent. Also right, dominant arm distal radius fracture. Pain is dull, aching, sharp with motion, and 2-6/10. Patient eager to get it fixed.   Past Medical History:  Diagnosis Date  . Adenomatous colon polyp 2008  . Arthritis    knees  . Blood transfusion 03/2011  . Blood transfusion without reported diagnosis   . Closed fracture of left distal radius 04/17/2020  . Closed intertrochanteric fracture of hip, right, initial encounter ([place]) 04/17/2020  . Colon polyps   . Family history of anesthesia complication    " my son aspirated "  . GERD (gastroesophageal reflux disease)   . History of shingles   . Hyperlipidemia   . Hypertension   . 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  Marland Kitchen Rosacea     Past Surgical History:  Procedure Laterality Date  . APPENDECTOMY  1958  . CHOLECYSTECTOMY  1958  . COLONOSCOPY    . FEMUR IM NAIL Right 05/01/2014   Procedure: INTRAMEDULLARY (IM) RETROGRADE FEMORAL NAILING;  Surgeon: Johnny Bridge, MD;  Location: [facility];  Service: Orthopedics;  Laterality: Right;  . FRACTURE SURGERY    . JOINT REPLACEMENT    . POLYPECTOMY    . REPLACEMENT TOTAL KNEE BILATERAL  2011 & 2012  . TONSILLECTOMY      Family History  Problem Relation Age of Onset  . COPD Father   . Asthma Father   . Asthma Sister   . Colon cancer Neg Hx   . Stomach cancer Neg Hx     Social History:  reports that she has quit smoking. Her smoking use included cigarettes. She has never used smokeless tobacco. She reports that she does not drink alcohol or use  drugs.  Allergies: No Known Allergies  Medications:  Prior to Admission:  Medications Prior to Admission  Medication Sig Dispense Refill Last Dose  . amLODipine (NORVASC) 5 MG tablet Take 5 mg by mouth daily.    04/17/2020 at am  . Ascorbic Acid (VITAMIN C PO) Take 1 tablet by mouth daily with lunch.   04/16/2020 at Unknown time  . BIOTIN PO Take 1 tablet by mouth daily with lunch.   04/16/2020 at Unknown time  . Calcium Carb-Cholecalciferol (CALCIUM+D3 PO) Take 1 tablet by mouth daily with lunch.   04/16/2020 at Unknown time  . Carboxymethylcellulose Sodium (THERATEARS OP) Place 1 drop into both eyes daily.   04/17/2020 at am  . diphenhydramine-acetaminophen (TYLENOL PM) 25-500 MG TABS tablet Take 1 tablet by mouth at bedtime as needed (sleep).   04/16/2020 at pm  . Multiple Vitamin (MULTIVITAMIN WITH MINERALS) TABS tablet Take 1 tablet by mouth daily with lunch.   04/16/2020 at am  . pantoprazole (PROTONIX) 40 MG tablet Take 40 mg by mouth daily.   04/17/2020 at am  . POTASSIUM PO Take 1 tablet by mouth daily with lunch.   04/16/2020 at Unknown time  . simvastatin (ZOCOR) 20 MG tablet Take 20 mg by mouth at bedtime.   04/16/2020 at pm  . VITAMIN D PO  Take 1 tablet by mouth daily with lunch.   04/16/2020 at Unknown time    Results for orders placed or performed during the hospital encounter of 04/17/20 (from the past 48 hour(s))  Surgical PCR screen     Status: None   Collection Time: 04/17/20 10:31 PM   Specimen: Nasal Mucosa; Nasal Swab  Result Value Ref Range   MRSA, PCR NEGATIVE NEGATIVE   Staphylococcus aureus NEGATIVE NEGATIVE    Comment: (NOTE) The Xpert SA Assay (FDA approved for NASAL specimens in patients 15 years of age and older), is one component of a comprehensive surveillance program. It is not intended to diagnose infection nor to guide or monitor treatment. Performed at [facility], [place] [address], [place], [place] [address]   CBC     Status: Abnormal   Collection  Time: 04/18/20  3:49 AM  Result Value Ref Range   WBC 9.9 4.0 - 10.5 K/uL   RBC 3.25 (L) 3.87 - 5.11 MIL/uL   Hemoglobin 10.9 (L) 12.0 - 15.0 g/dL   HCT 32.7 (L) 36.0 - 46.0 %   MCV 100.6 (H) 80.0 - 100.0 fL   MCH 33.5 26.0 - 34.0 pg   MCHC 33.3 30.0 - 36.0 g/dL   RDW 13.4 11.5 - 15.5 %   Platelets 207 150 - 400 K/uL   nRBC 0.0 0.0 - 0.2 %    Comment: Performed at [facility], [place] [address] [place], [place] [id number]  Basic metabolic panel     Status: Abnormal   Collection Time: 04/18/20  3:49 AM  Result Value Ref Range   Sodium 139 135 - 145 mmol/L   Potassium 4.1 3.5 - 5.1 mmol/L   Chloride 103 98 - 111 mmol/L   CO2 25 22 - 32 mmol/L   Glucose, Bld 126 (H) 70 - 99 mg/dL    Comment: Glucose reference range applies only to samples taken after fasting for at least 8 hours.   BUN 13 8 - 23 mg/dL   Creatinine, Ser 0.84 0.44 - 1.00 mg/dL   Calcium 9.4 8.9 - 10.3 mg/dL   GFR calc non Af Amer >60 >60 mL/min   GFR calc Af Amer >60 >60 mL/min   Anion gap 11 5 - 15    Comment: Performed at [facility] [address] [place], [place] [address]  CBC with Differential/Platelet     Status: Abnormal   Collection Time: 04/19/20  5:55 AM  Result Value Ref Range   WBC 9.1 4.0 - 10.5 K/uL   RBC 3.12 (L) 3.87 - 5.11 MIL/uL   Hemoglobin 10.4 (L) 12.0 - 15.0 g/dL   HCT 31.6 (L) 36.0 - 46.0 %   MCV 101.3 (H) 80.0 - 100.0 fL   MCH 33.3 26.0 - 34.0 pg   MCHC 32.9 30.0 - 36.0 g/dL   RDW 13.2 11.5 - 15.5 %   Platelets 144 (L) 150 - 400 K/uL   nRBC 0.0 0.0 - 0.2 %   Neutrophils Relative % 78 %   Neutro Abs 7.1 1.7 - 7.7 K/uL   Lymphocytes Relative 10 %   Lymphs Abs 0.9 0.7 - 4.0 K/uL   Monocytes Relative 10 %   Monocytes Absolute 0.9 0.1 - 1.0 K/uL   Eosinophils Relative 2 %   Eosinophils Absolute 0.2 0.0 - 0.5 K/uL   Basophils Relative 0 %   Basophils Absolute 0.0 0.0 - 0.1 K/uL   Immature Granulocytes 0 %  Abs Immature Granulocytes 0.03 0.00 - 0.07 K/uL    Comment:  Performed at [facility], [place] [address], [place], [place] [id number]  Basic metabolic panel     Status: Abnormal   Collection Time: 04/19/20  5:55 AM  Result Value Ref Range   Sodium 135 135 - 145 mmol/L   Potassium 3.4 (L) 3.5 - 5.1 mmol/L   Chloride 99 98 - 111 mmol/L   CO2 28 22 - 32 mmol/L   Glucose, Bld 173 (H) 70 - 99 mg/dL    Comment: Glucose reference range applies only to samples taken after fasting for at least 8 hours.   BUN 8 8 - 23 mg/dL   Creatinine, Ser 0.75 0.44 - 1.00 mg/dL   Calcium 9.3 8.9 - 10.3 mg/dL   GFR calc non Af Amer >60 >60 mL/min   GFR calc Af Amer >60 >60 mL/min   Anion gap 8 5 - 15    Comment: Performed at [facility] [address], [place], [place] [address]  Vitamin B12     Status: None   Collection Time: 04/19/20  5:55 AM  Result Value Ref Range   Vitamin B-12 615 180 - 914 pg/mL    Comment: (NOTE) This assay is not validated for testing neonatal or myeloproliferative syndrome specimens for Vitamin B12 levels. Performed at [facility], [place] [address] [place], [place] [address]   Folate     Status: None   Collection Time: 04/19/20  5:55 AM  Result Value Ref Range   Folate 24.4 >5.9 ng/mL    Comment: Performed at [facility] [address] [place], [place] [address]  Iron and TIBC     Status: None   Collection Time: 04/19/20  5:55 AM  Result Value Ref Range   Iron 33 28 - 170 ug/dL   TIBC 295 250 - 450 ug/dL   Saturation Ratios 11 10.4 - 31.8 %   UIBC 262 ug/dL    Comment: Performed at [facility], [place] [address] [place], [place] [address]  Ferritin     Status: None   Collection Time: 04/19/20  5:55 AM  Result Value Ref Range   Ferritin 72 11 - 307 ng/mL    Comment: Performed at [facility], [place] [address], [place], [place] [address]    DG Wrist 2 Views Right  Result Date: 04/19/2020 CLINICAL DATA:  Operative fixation of a right distal radius fracture. EXAM: RIGHT WRIST - 2 VIEW COMPARISON:   04/17/2020 FINDINGS: 5 C-arm views of the right wrist demonstrate screw plate fixation of the previously demonstrated distal radial metaphysis fracture. Anatomic position and alignment on these views. IMPRESSION: Hardware fixation of the previously demonstrated distal radial metaphysis fracture. Electronically Signed   By: Claudie Revering M.D.   On: 04/19/2020 12:57   CT Head Wo Contrast  Result Date: 04/17/2020 CLINICAL DATA:  Acute pain due to trauma. Headache. Laceration to the right forehead status post fall. EXAM: CT HEAD WITHOUT CONTRAST TECHNIQUE: Contiguous axial images were obtained from the base of the skull through the vertex without intravenous contrast. COMPARISON:  None. FINDINGS: Brain: No evidence of acute infarction, hemorrhage, hydrocephalus, extra-axial collection or mass lesion/mass effect. Atrophy and chronic microvascular ischemic changes are noted. Vascular: No hyperdense vessel or unexpected calcification. Skull: Normal. Negative for fracture or focal lesion. Sinuses/Orbits: No acute finding. Other: None. IMPRESSION: 1. No acute intracranial abnormality. 2. Atrophy and chronic microvascular ischemic  changes. Electronically Signed   By: Constance Holster M.D.   On: 04/17/2020 18:27   CT Cervical Spine Wo Contrast  Result Date: 04/17/2020 CLINICAL DATA:  Acute pain due to trauma EXAM: CT CERVICAL SPINE WITHOUT CONTRAST TECHNIQUE: Multidetector CT imaging of the cervical spine was performed without intravenous contrast. Multiplanar CT image reconstructions were also generated. COMPARISON:  None. FINDINGS: Alignment: Normal. Skull base and vertebrae: No acute fracture. No primary bone lesion or focal pathologic process. Soft tissues and spinal canal: No prevertebral fluid or swelling. No visible canal hematoma. Disc levels: Mild multilevel degenerative changes are noted throughout the cervical spine. These are greatest at the C6-C7 level. Upper chest: Negative. Other: None. IMPRESSION: 1. No  acute fracture or subluxation of the cervical spine. 2. Mild multilevel degenerative changes. Electronically Signed   By: Constance Holster M.D.   On: 04/17/2020 18:34   Chest Portable 1 View  Result Date: 04/17/2020 CLINICAL DATA:  Preoperative evaluation, former smoker EXAM: PORTABLE CHEST 1 VIEW COMPARISON:  Portable exam 2019 hours compared to 12/12/2015 FINDINGS: Normal heart size and pulmonary vascularity. Tortuous thoracic aorta. Emphysematous and bronchitic changes likely reflecting COPD. No acute infiltrate, pleural effusion or pneumothorax. Bones demineralized. IMPRESSION: Bronchitic and emphysematous likely reflecting COPD. No acute abnormalities. Electronically Signed   By: Lavonia Dana M.D.   On: 04/17/2020 20:32   DG C-Arm 1-60 Min  Result Date: 04/19/2020 CLINICAL DATA:  Proximal right femur fracture following a fall. EXAM: RIGHT FEMUR 2 VIEWS; DG C-ARM 1-60 MIN COMPARISON:  04/17/2020 FINDINGS: The previously demonstrated intramedullary rod and screws have been replaced with a new rod, screws and compression screw. These are bridging an intertrochanteric fracture in essentially anatomic position and alignment. A medially displaced lesser trochanter fragment is again demonstrated. IMPRESSION: Hardware fixation of an intertrochanteric fracture in essentially anatomic position and alignment. Electronically Signed   By: Claudie Revering M.D.   On: 04/19/2020 12:45   DG FEMUR, MIN 2 VIEWS RIGHT  Result Date: 04/19/2020 CLINICAL DATA:  Proximal right femur fracture following a fall. EXAM: RIGHT FEMUR 2 VIEWS; DG C-ARM 1-60 MIN COMPARISON:  04/17/2020 FINDINGS: The previously demonstrated intramedullary rod and screws have been replaced with a new rod, screws and compression screw. These are bridging an intertrochanteric fracture in essentially anatomic position and alignment. A medially displaced lesser trochanter fragment is again demonstrated. IMPRESSION: Hardware fixation of an intertrochanteric  fracture in essentially anatomic position and alignment. Electronically Signed   By: Claudie Revering M.D.   On: 04/19/2020 12:45    ROS No recent fever, bleeding abnormalities, urologic dysfunction, GI problems, or weight gain. Blood pressure 139/83, pulse 88, temperature 98.2 F (36.8 C), temperature source Oral, resp. rate 15, height 5\' 2"  (1.575 m), weight 57.2 kg, SpO2 97 %. Physical Exam Pleasant and conversant, A&O x 4 Head laceration with sutures, little swelling RUEx   Splint in place  Sens  Ax/R/M/U intact  Mot   Ax/ R/ PIN/ M/ AIN/ U intact  Brisk CR LUEx   Wrist ecchymosis but no wounds or crepitus, tenderness mild, no splint  Sens  Ax/R/M/U intact  Mot   Ax/ R/ PIN/ M/ AIN/ U intact  Rad 2+ RLE Tender hip, no wounds  Edema/ swelling controlled  Sens: DPN, SPN, TN intact  Motor: EHL, FHL, and lessor toe ext and flex all intact grossly  Brisk cap refill, warm to touch, DP palp LLE  No tenderness, no wounds   Assessment/Plan:  In addition to the right hip  fracture, the patient and I discussed her right distal radius fracture, which I would definitely treat nonsurgically if isolated, but given her associated injuries, right hand dominance, and the fact that vast majority of her risk is associated with anesthesia itself which is already necessary, I have recommended ORIF of the distal radius, as well, which she would strongly like to proceed with and has provided consent to do so.  I discussed with the patient the risks and benefits of surgery for the right hip and wrist, including the possibility of infection, nerve injury, vessel injury, wound breakdown, arthritis, symptomatic hardware, DVT/ PE, loss of motion, malunion, nonunion, and need for further surgery among others. She acknowledged these risks and wished to proceed.  Altamese Coles, MD [facility], [facility] [phone]

## 2020-04-19 NOTE — Progress Notes (Signed)
Patient reports being right hand dominant. Due to injury she had to sign consent form with non- dominant hand and signature was slightly above the signature line and angled upward.

## 2020-04-19 NOTE — Anesthesia Procedure Notes (Addendum)
Procedure Name: Intubation Date/Time: 04/19/2020 8:34 AM Performed by: Trinna Post., CRNA Pre-anesthesia Checklist: Patient identified, Emergency Drugs available, Suction available, Patient being monitored and Timeout performed Patient Re-evaluated:Patient Re-evaluated prior to induction Oxygen Delivery Method: Circle system utilized Preoxygenation: Pre-oxygenation with 100% oxygen Induction Type: IV induction, Rapid sequence and Cricoid Pressure applied Laryngoscope Size: Glidescope and 3 Grade View: Grade I Tube type: Oral Tube size: 7.0 mm Number of attempts: 1 Airway Equipment and Method: Rigid stylet and Video-laryngoscopy Placement Confirmation: ETT inserted through vocal cords under direct vision,  positive ETCO2 and breath sounds checked- equal and bilateral Secured at: 22 cm Tube secured with: Tape Dental Injury: Teeth and Oropharynx as per pre-operative assessment  Comments: Based on previous intubation note from 2015, elected to use glidescope due to short TM distance and prominent uppers.

## 2020-04-19 NOTE — Transfer of Care (Signed)
Immediate Anesthesia Transfer of Care Note  Patient: Savannah Coleman  Procedure(s) Performed: REMOVAL OF BIOMET RETROGRADE NAIL (Right ) INSERTION OF ANTEGRADE INTRAMEDULLARY (IM) NAIL FEMORAL (Right ) Open Reduction Internal Fixation (Orif) Wrist Fracture (Right Wrist)  Patient Location: PACU  Anesthesia Type:General  Level of Consciousness: drowsy and patient cooperative  Airway & Oxygen Therapy: Patient Spontanous Breathing and Patient connected to nasal cannula oxygen  Post-op Assessment: Report given to RN and Post -op Vital signs reviewed and stable  Post vital signs: Reviewed and stable  Last Vitals:  Vitals Value Taken Time  BP 154/76 04/19/20 1252  Temp    Pulse 81 04/19/20 1252  Resp 18 04/19/20 1252  SpO2 95 % 04/19/20 1252  Vitals shown include unvalidated device data.  Last Pain:  Vitals:   04/19/20 0723  TempSrc:   PainSc: 7       Patients Stated Pain Goal: 1 ([id number])  Complications: No apparent anesthesia complications

## 2020-04-19 NOTE — Progress Notes (Signed)
Able to gt pt to agree with x-rays after being pre medicated with pain meds. Family adamant about staying with their mother- provided with visitor guidelines policy- and referred issue to charge nurse-and Mudlogger

## 2020-04-19 NOTE — Plan of Care (Signed)
  Problem: Pain Management: Goal: Pain level will decrease Outcome: Progressing   

## 2020-04-19 NOTE — Anesthesia Postprocedure Evaluation (Signed)
Anesthesia Post Note  Patient: Savannah Coleman  Procedure(s) Performed: REMOVAL OF BIOMET RETROGRADE NAIL (Right ) INSERTION OF ANTEGRADE INTRAMEDULLARY (IM) NAIL FEMORAL (Right ) Open Reduction Internal Fixation (Orif) Wrist Fracture (Right Wrist)     Patient location during evaluation: PACU Anesthesia Type: General Level of consciousness: sedated Pain management: pain level controlled Vital Signs Assessment: post-procedure vital signs reviewed and stable Respiratory status: spontaneous breathing and respiratory function stable Cardiovascular status: stable Postop Assessment: no apparent nausea or vomiting Anesthetic complications: no    Last Vitals:  Vitals:   04/19/20 1323 04/19/20 1326  BP: 135/70   Pulse: 76   Resp:    Temp:  36.4 C  SpO2: 94%     Last Pain:  Vitals:   04/19/20 1323  TempSrc:   PainSc: Asleep                 Jahlen Bollman DANIEL

## 2020-04-19 NOTE — Plan of Care (Signed)
  Problem: Activity: Goal: Ability to ambulate and perform ADLs will improve Outcome: Progressing   Problem: Self-Concept: Goal: Ability to maintain and perform role responsibilities to the fullest extent possible will improve Outcome: Progressing   Problem: Pain Management: Goal: Pain level will decrease Outcome: Progressing   

## 2020-04-19 NOTE — Brief Op Note (Signed)
04/19/2020  5:07 PM  PATIENT:  Savannah Coleman  86 y.o. female  PRE-OPERATIVE DIAGNOSIS:   1. RIGHT INTERTROCHANTERIC FEMUR FRACTURE, PERI-IMPLANT 2. RIGHT DISTAL RADIUS FRACTURE, COLLES  POST-OPERATIVE DIAGNOSIS:   1. RIGHT INTERTROCHANTERIC FEMUR FRACTURE, PERI-IMPLANT 2. RIGHT DISTAL RADIUS FRACTURE, COLLES  PROCEDURE:  Procedure(s):  1. RIGHT HIP ANTEGRADE INTRAMEDULLARY NAILING WITH BIOMET AFFIXUS 11 MM X 360 MM  2. REMOVAL OF BIOMET RETROGRADE NAIL (Right) 3. Open Reduction Internal Fixation (Orif) Wrist Fracture (Right) DISTAL RADIUS DVR PLATE  SURGEON:  Surgeon(s) and Role:  Altamese [place], MD   PHYSICIAN ASSISTANT: Ainsley Spinner, PA-C  ANESTHESIA:   general  EBL:  125 mL   BLOOD ADMINISTERED:none  DRAINS: none   LOCAL MEDICATIONS USED:  NONE  SPECIMEN:  No Specimen  DISPOSITION OF SPECIMEN:  N/A  COUNTS:  YES  TOURNIQUET:  * No tourniquets in log *  DICTATION: .Note written in EPIC  PLAN OF CARE: Admit to inpatient   PATIENT DISPOSITION:  PACU - hemodynamically stable.   Delay start of Pharmacological VTE agent (>24hrs) due to surgical blood loss or risk of bleeding: no  PROCEDURE:  Intramedullary nailing of the right hip using a Biomet Affixus nail.  SURGEON:  Astrid Divine. Marcelino Scot, M.D.  ASSISTANT:  Ainsley Spinner, PA-C.  ANESTHESIA:  General.  COMPLICATIONS:  None.  ESTIMATED BLOOD LOSS:  Less than 150 mL.  DISPOSITION:  To PACU.  CONDITION:  Stable.  BRIEF SUMMARY AND INDICATION OF PROCEDURE:  Savannah Coleman is a 86 y.o. year- old with multiple injuries in addition to several medical problems.  I discussed with the patient the risks and benefits of surgical treatment for both the hip and wrist including the potential for malunion, nonunion, symptomatic hardware, heart attack, stroke, neurovascular injury, bleeding, and others.  After full discussion, the patient wished to proceed.  BRIEF SUMMARY OF PROCEDURE:  The patient was taken to the  operating room where general anesthesia was induced.  She was positioned supine on the Radiolucent flat table. A thorough scrub and wash with chlorhexidine and then Betadine scrub and paint was performed.   Time-out was held.  C-arm was brought in to identify the correct starting point.  The old incision was remade about the knee and then a medial parapatellar deep incision in spite of the somewhat lateral anterior knee incision. With the assistance of x-ray, a tonsil clamp was  carefully advanced into the center of each distal and proximal screw.  I then removed all screws without complication. I then engaged the threaded extraction bolt and removed the femoral nail. The wounds were irrigated thoroughly, closed in standard layered fashion with 0 Vicryl for the retinaculum, 2-0 Vicryl and 2-0 nylon for the skin. Ainsley Spinner, PAC, assisted throughout.  Next a long instrument was used to identify the appropriate starting position under C-arm on both AP and lateral images while my assistant pulled traction and derotated the hip fracture.  A 3 cm incision was made proximal to the greater trochanter.  The curved cannulated awl was inserted just medial to the tip of the lateral trochanter and then the starting guidewire advanced into the proximal femur.  This was checked on AP and lateral views.  The starting reamer was engaged with the soft tissue protected by a sleeve.  The curved ball-tipped guidewire was then inserted, making sure it was just posterior as possible in the distal femur and across the fracture site, which stayed in a reduced position.  It was sequentially  reamed up to 13 and an 11 x 360 mm nail inserted to the appropriate depth.  The guidewire for the lag screw was then inserted with the appropriate anteversion to make sure it was in a center-center position.  This was measured and then after placing a threaded guidewire to prevent rotation, the lag screw placed with excellent  purchase and position checked on both views.  The internal sliding screw was then engaged within the groove of the lag screw, which was allowed to telescope.  Traction was released and compression achieved with the Lag screw compression device.  This was followed by placement of one distal locking screw using perfect circle technique.  This was confirmed on AP and lateral images. Wounds were irrigated thoroughly, closed in a standard layered fashion. Sterile gently compressive dressings were applied.  Ainsley Spinner, PA-C, assisted throughout.   She was then repositioned. The operative upper extremity was prepped and draped in the usual sterile fashion.  No tourniquet was inflated during the procedure.  After a timeout, standard volar approach was made to the distal radius.  The deep aspect of the flexor carpi radialis tendon sheath was incised and then the tendon and radial artery retracted radially for protection.  The radial edge of the pronator was incised and the muscle belly swept ulnarly with a Bennett retractor carefully placed there. I did place 2 towels underneath the carpus to help restore tilt, but it was quite difficult to restore alignment and so an assistant was necessary to obtain and maintain reduction with traction in addition to retracting. I then placed k wires, checked plate position, and followed with both proximal and distal row pegs with the metaphysis secured using standard fixation. As I brought the plate down to the bone, appropriate tilt and inclination were restored. Height was restored, as well, but produced a ulnar positive variance similar to the other side if not better. Shuck test of the DRUJ did not suggest instability that would require fixation.  Wound was irrigated thoroughly and then the wound closed in standard layered fashion using 0 Vicryl, 2-0 Vicryl and 3-0 nylon for the skin.  Sterile gently compressive dressing was applied and then a volar splint.  The patient was  awakened from the anesthesia and transported to PACU in stable condition.  Again my assistant was necessary for successful, safe, and expedient completion of the case.  PROGNOSIS:  The patient will be weightbearing as tolerated with platform walker and physical therapy beginning DVT prophylaxis as soon as deemed stable by the Primary Care Service.  She has no hip range of motion precautions. Wrist will be treated with ice, elevation (hand above the elbow and elbow above the heart), and unrestricted range of motion of the digits and elbow.  Return to the office in 10--14 days for removal of sutures and conversion into a removal splint. We will continue to follow through at the hospital.  Anticipate follow up in the office in 2 weeks for removal of sutures and further evaluation.     Astrid Divine. Marcelino Scot, M.D.

## 2020-04-19 NOTE — Progress Notes (Signed)
Pt's daughters refused x-rays at this time, Lanny Hurst, [place] notified, will call radiology once Pt is ready.  Pt's ring handed to daughter.

## 2020-04-19 NOTE — Progress Notes (Signed)
PROGRESS NOTE  Savannah Coleman [id number] DOB: [birth year] DOA: 04/17/2020 PCP: Jonathon Resides, MD  HPI/Recap of past 24 hours: HPI from Dr Evon Slack is a 86 y.o. female with medical history significant of HTN, prior R femur fx in 2015. Pt presents to ED after she slipped and fell in shower.  Pain to both wrists and R femur. Unable to bear wait, pain worse with movement.  Couldn't bend or walk R leg. In the ED, noted a R wrist fx, and a much more complicated R perihardware lesser trochanter fracture of femur.  EDP consulted orthopedics.  Patient admitted for further management.    Today, saw patient after surgery, resting comfortably. Daughter at bedside, reports mother hasnt rested this way in days. Looks comfortable.    Assessment/Plan: Active Problems:   Hypertension   Closed fracture of right distal radius   Right periprosthetic intertrochanteric hip fracture s/p surgery on 04/19/2020 S/p mechanical fall X-ray shows above CT head/cervical spine showed no acute fracture or intracranial abnormality Orthopedics on board Pain management, DVT PPx as per orthopedics PT/OT when able  Right wrist fracture s/p ORIF on 04/19/2020 X-ray showed mildly displaced transverse fracture of the right radial head Orthopedics on board, appreciate recs PT/OT when able  Macrocytic anemia Anemia panel unremarkable Daily CBC  Hypertension Continue home amlodipine  Hyperlipidemia Continue home statin  GERD Continue PPI       Malnutrition Type:  Nutrition Problem: Increased nutrient needs Etiology: post-op healing   Malnutrition Characteristics:  Signs/Symptoms: estimated needs   Nutrition Interventions:  Interventions: MVI, Ensure Enlive (each supplement provides 350kcal and 20 grams of protein)    Estimated body mass index is 23.05 kg/m as calculated from the following:   Height as of this encounter: 5\' 2"  (1.575 m).   Weight as of this encounter:  57.2 kg.     Code Status: Full  Family Communication: Daughter at bedside  Disposition Plan: Status is: Inpatient  Remains inpatient appropriate because:Inpatient level of care appropriate due to severity of illness   Dispo: The patient is from: Home              Anticipated d/c is to: SNF              Anticipated d/c date is: 2 days              Patient currently is not medically stable to d/c.  Pending ortho sign off    Consultants:  Orthopedics  Procedures:  None  Antimicrobials:  None  DVT prophylaxis: Lovenox-hold pending surgery   Objective: Vitals:   04/19/20 1253 04/19/20 1308 04/19/20 1323 04/19/20 1326  BP: (!) 154/76 139/73 135/70   Pulse: 80 78 76   Resp: 14 18    Temp: 97.6 F (36.4 C)   97.6 F (36.4 C)  TempSrc:      SpO2: 96% 93% 94%   Weight:      Height:        Intake/Output Summary (Last 24 hours) at 04/19/2020 1448 Last data filed at 04/19/2020 1323 Gross per 24 hour  Intake 500 ml  Output 585 ml  Net -85 ml   Filed Weights   04/17/20 1424 04/19/20 0654  Weight: 57.2 kg 57.2 kg    Exam:  General: NAD, laceration noted on R side of face  Cardiovascular: S1, S2 present  Respiratory: CTAB  Abdomen: Soft, nontender, nondistended, bowel sounds present  Musculoskeletal: No bilateral pedal edema noted, right  upper extremity wrapped in a splint  Skin: Normal  Psychiatry: Normal mood   Data Reviewed: CBC: Recent Labs  Lab 04/17/20 1600 04/18/20 0349 04/19/20 0555  WBC 11.9* 9.9 9.1  NEUTROABS 9.7*  --  7.1  HGB 12.2 10.9* 10.4*  HCT 38.1 32.7* 31.6*  MCV 101.1* 100.6* 101.3*  PLT 219 207 123456*   Basic Metabolic Panel: Recent Labs  Lab 04/17/20 1600 04/18/20 0349 04/19/20 0555  NA 141 139 135  K 3.7 4.1 3.4*  CL 104 103 99  CO2 23 25 28   GLUCOSE 114* 126* 173*  BUN 14 13 8   CREATININE 0.71 0.84 0.75  CALCIUM 9.3 9.4 9.3   GFR: Estimated Creatinine Clearance: 39.9 mL/min (by C-G formula based on SCr of  0.75 mg/dL). Liver Function Tests: Recent Labs  Lab 04/17/20 1600  AST 43*  ALT 23  ALKPHOS 56  BILITOT 0.9  PROT 6.4*  ALBUMIN 3.9   No results for input(s): LIPASE, AMYLASE in the last 168 hours. No results for input(s): AMMONIA in the last 168 hours. Coagulation Profile: No results for input(s): INR, PROTIME in the last 168 hours. Cardiac Enzymes: No results for input(s): CKTOTAL, CKMB, CKMBINDEX, TROPONINI in the last 168 hours. BNP (last 3 results) No results for input(s): PROBNP in the last 8760 hours. HbA1C: No results for input(s): HGBA1C in the last 72 hours. CBG: No results for input(s): GLUCAP in the last 168 hours. Lipid Profile: No results for input(s): CHOL, HDL, LDLCALC, TRIG, CHOLHDL, LDLDIRECT in the last 72 hours. Thyroid Function Tests: No results for input(s): TSH, T4TOTAL, FREET4, T3FREE, THYROIDAB in the last 72 hours. Anemia Panel: Recent Labs    04/19/20 0555  VITAMINB12 615  FOLATE 24.4  FERRITIN 72  TIBC 295  IRON 33   Urine analysis:    Component Value Date/Time   COLORURINE YELLOW 12/12/2015 1057   APPEARANCEUR CLOUDY (A) 12/12/2015 1057   LABSPEC 1.015 12/12/2015 1057   PHURINE 5.0 12/12/2015 1057   GLUCOSEU NEGATIVE 12/12/2015 1057   HGBUR TRACE (A) 12/12/2015 1057   BILIRUBINUR NEGATIVE 12/12/2015 1057   BILIRUBINUR small 01/17/2015 1139   KETONESUR NEGATIVE 12/12/2015 1057   PROTEINUR NEGATIVE 12/12/2015 1057   UROBILINOGEN 1.0 01/17/2015 1139   UROBILINOGEN 0.2 05/02/2014 1652   NITRITE NEGATIVE 12/12/2015 1057   LEUKOCYTESUR NEGATIVE 12/12/2015 1057   Sepsis Labs: @LABRCNTIP (procalcitonin:4,lacticidven:4)  ) Recent Results (from the past 240 hour(s))  Respiratory Panel by RT PCR (Flu A&B, Covid) - Nasopharyngeal Swab     Status: None   Collection Time: 04/17/20  4:48 PM   Specimen: Nasopharyngeal Swab  Result Value Ref Range Status   SARS Coronavirus 2 by RT PCR NEGATIVE NEGATIVE Final    Comment: (NOTE) SARS-CoV-2  target nucleic acids are NOT DETECTED. The SARS-CoV-2 RNA is generally detectable in upper respiratoy specimens during the acute phase of infection. The lowest concentration of SARS-CoV-2 viral copies this assay can detect is 131 copies/mL. A negative result does not preclude SARS-Cov-2 infection and should not be used as the sole basis for treatment or other patient management decisions. A negative result may occur with  improper specimen collection/handling, submission of specimen other than nasopharyngeal swab, presence of viral mutation(s) within the areas targeted by this assay, and inadequate number of viral copies (<131 copies/mL). A negative result must be combined with clinical observations, patient history, and epidemiological information. The expected result is Negative. Fact Sheet for Patients:  PinkCheek.be Fact Sheet for Healthcare Providers:  GravelBags.it This test  is not yet ap proved or cleared by the [place] and  has been authorized for detection and/or diagnosis of SARS-CoV-2 by FDA under an Emergency Use Authorization (EUA). This EUA will remain  in effect (meaning this test can be used) for the duration of the COVID-19 declaration under Section 564(b)(1) of the Act, 21 U.S.C. section 360bbb-3(b)(1), unless the authorization is terminated or revoked sooner.    Influenza A by PCR NEGATIVE NEGATIVE Final   Influenza B by PCR NEGATIVE NEGATIVE Final    Comment: (NOTE) The Xpert Xpress SARS-CoV-2/FLU/RSV assay is intended as an aid in  the diagnosis of influenza from Nasopharyngeal swab specimens and  should not be used as a sole basis for treatment. Nasal washings and  aspirates are unacceptable for Xpert Xpress SARS-CoV-2/FLU/RSV  testing. Fact Sheet for Patients: PinkCheek.be Fact Sheet for Healthcare Providers: GravelBags.it This test is  not yet approved or cleared by the [place] FDA and  has been authorized for detection and/or diagnosis of SARS-CoV-2 by  FDA under an Emergency Use Authorization (EUA). This EUA will remain  in effect (meaning this test can be used) for the duration of the  Covid-19 declaration under Section 564(b)(1) of the Act, 21  U.S.C. section 360bbb-3(b)(1), unless the authorization is  terminated or revoked. Performed at [facility], [place] [address], [place], [place] [address]   Surgical PCR screen     Status: None   Collection Time: 04/17/20 10:31 PM   Specimen: Nasal Mucosa; Nasal Swab  Result Value Ref Range Status   MRSA, PCR NEGATIVE NEGATIVE Final   Staphylococcus aureus NEGATIVE NEGATIVE Final    Comment: (NOTE) The Xpert SA Assay (FDA approved for NASAL specimens in patients 4 years of age and older), is one component of a comprehensive surveillance program. It is not intended to diagnose infection nor to guide or monitor treatment. Performed at [facility], [place] [address], [place], [place] [address]       Studies: DG Wrist 2 Views Right  Result Date: 04/19/2020 CLINICAL DATA:  Operative fixation of a right distal radius fracture. EXAM: RIGHT WRIST - 2 VIEW COMPARISON:  04/17/2020 FINDINGS: 5 C-arm views of the right wrist demonstrate screw plate fixation of the previously demonstrated distal radial metaphysis fracture. Anatomic position and alignment on these views. IMPRESSION: Hardware fixation of the previously demonstrated distal radial metaphysis fracture. Electronically Signed   By: Claudie Revering M.D.   On: 04/19/2020 12:57   DG C-Arm 1-60 Min  Result Date: 04/19/2020 CLINICAL DATA:  Proximal right femur fracture following a fall. EXAM: RIGHT FEMUR 2 VIEWS; DG C-ARM 1-60 MIN COMPARISON:  04/17/2020 FINDINGS: The previously demonstrated intramedullary rod and screws have been replaced with a new rod, screws and compression screw. These are bridging an  intertrochanteric fracture in essentially anatomic position and alignment. A medially displaced lesser trochanter fragment is again demonstrated. IMPRESSION: Hardware fixation of an intertrochanteric fracture in essentially anatomic position and alignment. Electronically Signed   By: Claudie Revering M.D.   On: 04/19/2020 12:45   DG FEMUR, MIN 2 VIEWS RIGHT  Result Date: 04/19/2020 CLINICAL DATA:  Proximal right femur fracture following a fall. EXAM: RIGHT FEMUR 2 VIEWS; DG C-ARM 1-60 MIN COMPARISON:  04/17/2020 FINDINGS: The previously demonstrated intramedullary rod and screws have been replaced with a new rod, screws and compression screw. These are bridging an intertrochanteric fracture in essentially anatomic position and alignment. A medially displaced lesser trochanter fragment is again demonstrated. IMPRESSION:  Hardware fixation of an intertrochanteric fracture in essentially anatomic position and alignment. Electronically Signed   By: Claudie Revering M.D.   On: 04/19/2020 12:45    Scheduled Meds: . acetaminophen  500 mg Oral Q12H  . amLODipine  5 mg Oral Daily  . bacitracin   Topical BID  . [START ON 04/20/2020] feeding supplement (ENSURE ENLIVE)  237 mL Oral BID BM  . fentaNYL      . HYDROcodone-acetaminophen      . [START ON 04/20/2020] multivitamin with minerals  1 tablet Oral Daily  . mupirocin ointment  1 application Nasal BID  . pantoprazole  40 mg Oral Daily  . simvastatin  20 mg Oral QHS    Continuous Infusions:   LOS: 2 days     Alma Friendly, MD [facility]  If 7PM-7AM, please contact night-coverage [email or web address] 04/19/2020, 2:48 PM

## 2020-04-20 LAB — CBC WITH DIFFERENTIAL/PLATELET
Abs Immature Granulocytes: 0.09 10*3/uL — ABNORMAL HIGH (ref 0.00–0.07)
Basophils Absolute: 0 10*3/uL (ref 0.0–0.1)
Basophils Relative: 0 %
Eosinophils Absolute: 0.1 10*3/uL (ref 0.0–0.5)
Eosinophils Relative: 1 %
HCT: 24.9 % — ABNORMAL LOW (ref 36.0–46.0)
Hemoglobin: 8.2 g/dL — ABNORMAL LOW (ref 12.0–15.0)
Immature Granulocytes: 1 %
Lymphocytes Relative: 9 %
Lymphs Abs: 1 10*3/uL (ref 0.7–4.0)
MCH: 33.7 pg (ref 26.0–34.0)
MCHC: 32.9 g/dL (ref 30.0–36.0)
MCV: 102.5 fL — ABNORMAL HIGH (ref 80.0–100.0)
Monocytes Absolute: 1.3 10*3/uL — ABNORMAL HIGH (ref 0.1–1.0)
Monocytes Relative: 11 %
Neutro Abs: 9.3 10*3/uL — ABNORMAL HIGH (ref 1.7–7.7)
Neutrophils Relative %: 78 %
Platelets: 139 10*3/uL — ABNORMAL LOW (ref 150–400)
RBC: 2.43 MIL/uL — ABNORMAL LOW (ref 3.87–5.11)
RDW: 13.2 % (ref 11.5–15.5)
WBC: 11.9 10*3/uL — ABNORMAL HIGH (ref 4.0–10.5)
nRBC: 0 % (ref 0.0–0.2)

## 2020-04-20 LAB — BASIC METABOLIC PANEL
Anion gap: 7 (ref 5–15)
BUN: 11 mg/dL (ref 8–23)
CO2: 27 mmol/L (ref 22–32)
Calcium: 9.2 mg/dL (ref 8.9–10.3)
Chloride: 101 mmol/L (ref 98–111)
Creatinine, Ser: 0.7 mg/dL (ref 0.44–1.00)
GFR calc Af Amer: >60 mL/min (ref >60)
GFR calc non Af Amer: >60 mL/min (ref >60)
Glucose, Bld: 101 mg/dL — ABNORMAL HIGH (ref 70–99)
Potassium: 3.8 mmol/L (ref 3.5–5.1)
Sodium: 135 mmol/L (ref 135–145)

## 2020-04-20 LAB — VITAMIN D 25 HYDROXY (VIT D DEFICIENCY, FRACTURES): Vit D, 25-Hydroxy: 74.74 ng/mL (ref 30–100)

## 2020-04-20 MED ORDER — ENOXAPARIN SODIUM 40 MG/0.4ML ~~LOC~~ SOLN
40.0000 mg | SUBCUTANEOUS | Status: DC
  Administered 2020-04-20 – 2020-04-30 (×11): 40 mg via SUBCUTANEOUS
  Filled 2020-04-20 (×11): qty 0.4

## 2020-04-20 NOTE — NC FL2 (Signed)
[place] LEVEL OF CARE SCREENING TOOL     IDENTIFICATION  Patient Name: Savannah Coleman Birthdate: Jun 18, 1931 Sex: female Admission Date (Current Location): 04/17/2020  [facility] and [place] Number:  Herbalist and Address:  [facility] [place], [place] [address] [place], [place] [address]      Provider Number: [id number]  Attending Physician Name and Address:  Alma Friendly, MD  Relative Name and Phone Number:       Current Level of Care: Hospital Recommended Level of Care: [place] Prior Approval Number:    Date Approved/Denied:   PASRR Number: [id number]  Discharge Plan: SNF    Current Diagnoses: Patient Active Problem List   Diagnosis Date Noted  . Intertrochanteric fracture of right hip ([place]) 04/17/2020  . Closed fracture of right distal radius 04/17/2020  . Unspecified constipation 05/05/2014  . Postoperative anemia due to acute blood loss 05/02/2014  . Femur fracture, right ([place]) 04/30/2014  . Hypertension   . Hyperlipidemia   . Adenomatous colon polyp   . History of shingles   . Rosacea   . Positive PPD   . Blood transfusion 03/19/2011    Orientation RESPIRATION BLADDER Height & Weight     Self, Time, Place, Situation  O2(oxygen @ 2l/min) Continent Weight: 57.2 kg Height:  5\' 2"  (157.5 cm)  BEHAVIORAL SYMPTOMS/MOOD NEUROLOGICAL BOWEL NUTRITION STATUS      Continent Diet(refer to d/c summary)  AMBULATORY STATUS COMMUNICATION OF NEEDS Skin   Extensive Assist Verbally Bruising(R eye with brusied area,)                       Personal Care Assistance Level of Assistance  Bathing, Feeding, Dressing Bathing Assistance: Maximum assistance Feeding assistance: Limited assistance Dressing Assistance: Maximum assistance     Functional Limitations Info  Sight, Hearing, Speech Sight Info: Adequate Hearing Info: Adequate Speech Info: Adequate    SPECIAL CARE FACTORS FREQUENCY  PT (By  licensed PT), OT (By licensed OT)     PT Frequency: 5x / week, evaluate and treat OT Frequency: 5x / week, evaluate and treat            Contractures Contractures Info: Not present    Additional Factors Info  Code Status, Allergies Code Status Info: full code Allergies Info: No known drug allergies           Current Medications (04/20/2020):  This is the current hospital active medication list Current Facility-Administered Medications  Medication Dose Route Frequency Provider Last Rate Last Admin  . acetaminophen (TYLENOL) tablet 500 mg  500 mg Oral Q12H Ainsley Spinner, PA-C   500 mg at 04/20/20 [id number]  . amLODipine (NORVASC) tablet 5 mg  5 mg Oral Daily Ainsley Spinner, PA-C   5 mg at 04/20/20 [id number]  . bacitracin ointment   Topical BID Ainsley Spinner, PA-C   Given at 04/20/20 [id number]  . feeding supplement (ENSURE ENLIVE) (ENSURE ENLIVE) liquid 237 mL  237 mL Oral BID BM Ainsley Spinner, PA-C      . HYDROcodone-acetaminophen (NORCO/VICODIN) 5-325 MG per tablet 1-2 tablet  1-2 tablet Oral Q6H PRN Ainsley Spinner, PA-C   2 tablet at 04/20/20 1403  . HYDROmorphone (DILAUDID) injection 0.5-1 mg  0.5-1 mg Intravenous Q4H PRN Ainsley Spinner, PA-C   1 mg at 04/19/20 2021  . metoCLOPramide (REGLAN) injection 5-10 mg  5-10 mg Intravenous Q8H PRN Ainsley Spinner, PA-C   10 mg at 04/18/20 1935  .  multivitamin with minerals tablet 1 tablet  1 tablet Oral Daily Ainsley Spinner, PA-C   1 tablet at 04/20/20 [id number]  . mupirocin ointment (BACTROBAN) 2 % 1 application  1 application Nasal BID Ainsley Spinner, PA-C   1 application at [id number]  . ondansetron (ZOFRAN) injection 4 mg  4 mg Intravenous Q6H PRN Ainsley Spinner, PA-C   4 mg at 04/20/20 0039  . pantoprazole (PROTONIX) EC tablet 40 mg  40 mg Oral Daily Ainsley Spinner, PA-C   40 mg at 04/20/20 [id number]  . simvastatin (ZOCOR) tablet 20 mg  20 mg Oral QHS Ainsley Spinner, PA-C   20 mg at 04/19/20 2217     Discharge Medications: Please see discharge summary for a list of discharge  medications.  Relevant Imaging Results:  Relevant Lab Results:   Additional Information [phone]  Sharin Mons, RN

## 2020-04-20 NOTE — Evaluation (Signed)
Physical Therapy Evaluation Patient Details Name: Savannah Coleman MRN: [id number] DOB: 1931-11-26 Today's Date: 04/20/2020   History of Present Illness  Savannah Coleman is a 86 y.o. female with medical history significant of HTN, prior R femur fx in 2015. Pt presents to ED after she slipped and fell in shower. S/p right hip IM nail and right wrist ORIF.  Clinical Impression  Pt admitted with above diagnosis. Pt's daughter reports that pt was very confused yesterday and pt admits to some hallucinations, on eval pt was appropriate for basic conversation and displays quite a sense of humor. Max A +2 needed for bed mobility, sit<>stand transfer, and pivot to chair. Pt with R knee buckling in [place] today and therefore unable to step feet to ambulate, rather pivoted heels to get to chair. Needed vc's to maintain RUE NWB.  Pt currently with functional limitations due to the deficits listed below (see PT Problem List). Pt will benefit from skilled PT to increase their independence and safety with mobility to allow discharge to the venue listed below.       Follow Up Recommendations SNF    Equipment Recommendations  Rolling walker with 5" wheels    Recommendations for Other Services       Precautions / Restrictions Precautions Precautions: Fall Precaution Comments: pt fell in shower  Restrictions Weight Bearing Restrictions: Yes RUE Weight Bearing: Non weight bearing RLE Weight Bearing: Weight bearing as tolerated Other Position/Activity Restrictions: weight bearing through R elbow okay      Mobility  Bed Mobility Overal bed mobility: Needs Assistance Bed Mobility: Supine to Sit     Supine to sit: Max assist;+2 for physical assistance;+2 for safety/equipment     General bed mobility comments: maxA for RLE management and frequent cues for NWB through hand  Transfers Overall transfer level: Needs assistance Equipment used: 2 person hand held assist Transfers: Sit to/from  [facility] Sit to Stand: Mod assist;+2 physical assistance;+2 safety/equipment Stand pivot transfers: +2 physical assistance;+2 safety/equipment;Max assist       General transfer comment: modA to powerup into standing, frequent cues for upright posture, pt demonstrated preference for pivoting feet as opposed to taking steps.  Ambulation/Gait             General Gait Details: unable today  Stairs            Wheelchair Mobility    Modified Rankin (Stroke Patients Only)       Balance Overall balance assessment: Needs assistance Sitting-balance support: No upper extremity supported;Feet supported Sitting balance-Leahy Scale: Fair     Standing balance support: Bilateral upper extremity supported;During functional activity Standing balance-Leahy Scale: Zero Standing balance comment: max+2 for standing support                             Pertinent Vitals/Pain Pain Assessment: Faces Faces Pain Scale: Hurts little more Pain Location: RLE and RUE with mobility Pain Descriptors / Indicators: Grimacing;Guarding Pain Intervention(s): Limited activity within patient's tolerance;Monitored during session    Home Living Family/patient expects to be discharged to:: Skilled nursing facility Living Arrangements: Alone Available Help at Discharge: Family;Available 24 hours/day Type of Home: Apartment Home Access: Level entry     Home Layout: One level Home Equipment: Walker - 2 wheels;Shower seat Additional Comments: pt's daughter in the process of moving and will have pt home with her after rehab. Looking for one level living    Prior Function Level  of Independence: Independent with assistive device(s)         Comments: ambulated sometimes with cane, driving, did her own shopping, laundry, cooking     Hand Dominance   Dominant Hand: Right    Extremity/Trunk Assessment   Upper Extremity Assessment Upper Extremity Assessment: Defer  to OT evaluation RUE Deficits / Details: immobilized from forearm to MCP in splint, noted bruising on dorsal aspect of hand,noted increased edema in hand and digits, educated pt on strategies for edema control;full elbow and shoulder ROM RUE Coordination: decreased fine motor;decreased gross motor LUE Deficits / Details: noted bruising on dorsal aspect of hand and wrist, pt reports wrist is painful from fall LUE Coordination: decreased gross motor    Lower Extremity Assessment Lower Extremity Assessment: RLE deficits/detail RLE Deficits / Details: hip flex 2-/5, hip abd 2/5, hip add 2+/5, knee ext 2/5 (buckling with WB'ing), able to tolerate 90 deg R knee bend in sitting RLE: Unable to fully assess due to pain RLE Sensation: WNL RLE Coordination: decreased gross motor    Cervical / Trunk Assessment Cervical / Trunk Assessment: Kyphotic  Communication   Communication: No difficulties  Cognition Arousal/Alertness: Awake/alert Behavior During Therapy: WFL for tasks assessed/performed Overall Cognitive Status: Impaired/Different from baseline Area of Impairment: Memory;Safety/judgement                     Memory: Decreased short-term memory   Safety/Judgement: Decreased awareness of deficits;Decreased awareness of safety     General Comments: pt with good sense of humor. Daughter reports that pt was very confused after surgery but appropriate today with basic conversation. Needed frequent reminding to keep RUE NWB      General Comments General comments (skin integrity, edema, etc.): pt on 2 L  initially, SPO2 dropped to upper 80's occasionally when on 1 L but improved with cues. R PFRW put together and demo given to pt and daughter. Did not practice today due to pt fatigue after transferring    Exercises General Exercises - Lower Extremity Ankle Circles/Pumps: AROM;Both;20 reps;Supine;Seated Quad Sets: AROM;Both;10 reps;Seated   Assessment/Plan    PT Assessment  Patient needs continued PT services  PT Problem List Decreased strength;Decreased range of motion;Decreased activity tolerance;Decreased balance;Decreased mobility;Decreased coordination;Decreased knowledge of use of DME;Decreased knowledge of precautions;Decreased safety awareness;Pain;Cardiopulmonary status limiting activity       PT Treatment Interventions DME instruction;Gait training;Functional mobility training;Therapeutic activities;Therapeutic exercise;Balance training;Neuromuscular re-education;Patient/family education    PT Goals (Current goals can be found in the Care Plan section)  Acute Rehab PT Goals Patient Stated Goal: to get stronger PT Goal Formulation: With patient Time For Goal Achievement: 05/04/20 Potential to Achieve Goals: Good    Frequency Min 3X/week   Barriers to discharge        Co-evaluation PT/OT/SLP Co-Evaluation/Treatment: Yes Reason for Co-Treatment: Complexity of the patient's impairments (multi-system involvement);For patient/therapist safety PT goals addressed during session: Mobility/safety with mobility;Balance;Proper use of DME OT goals addressed during session: ADL's and self-care       AM-PAC PT "6 Clicks" Mobility  Outcome Measure Help needed turning from your back to your side while in a flat bed without using bedrails?: A Lot Help needed moving from lying on your back to sitting on the side of a flat bed without using bedrails?: A Lot Help needed moving to and from a bed to a chair (including a wheelchair)?: A Lot Help needed standing up from a chair using your arms (e.g., wheelchair or bedside chair)?: A Lot Help  needed to walk in hospital room?: Total Help needed climbing 3-5 steps with a railing? : Total 6 Click Score: 10    End of Session Equipment Utilized During Treatment: Gait belt;Oxygen Activity Tolerance: Patient tolerated treatment well Patient left: in chair;with call bell/phone within reach;with chair alarm set;with  family/visitor present Nurse Communication: Mobility status PT Visit Diagnosis: Unsteadiness on feet (R26.81);History of falling (Z91.81);Difficulty in walking, not elsewhere classified (R26.2);Pain Pain - Right/Left: Right Pain - part of body: Arm;Hip    Time: [id number] PT Time Calculation (min) (ACUTE ONLY): 33 min   Charges:   PT Evaluation $PT Eval Moderate Complexity: [place]  Pager [phone] Office [place] 04/20/2020, 1:48 PM

## 2020-04-20 NOTE — Evaluation (Signed)
Occupational Therapy Evaluation Patient Details Name: Savannah Coleman MRN: [id number] DOB: Jul 12, 1931 Today's Date: 04/20/2020    History of Present Illness Savannah Coleman is a 86 y.o. female with medical history significant of HTN, prior R femur fx in 2015. Pt presents to ED after she slipped and fell in shower. S/p right hip IM nail and right wrist ORIF.   Clinical Impression   PTA, pt was living at home alone, pt was independent with ADL/IADL and functional mobility at spc level. Pt's daughter present throughout session, pt will be living with daughter after rehab at [facility]. Pt currently requires maxA for bed mobility and maxA+2 for stand-pivot transfer to recliner. Pt required consistent cues for upright posture and cues for weight bearing precautions. Due to decline in current level of function, pt would benefit from acute OT to address established goals to facilitate safe D/C to venue listed below. At this time, recommend SNF follow-up. Will continue to follow acutely.     Follow Up Recommendations  SNF    Equipment Recommendations  3 in 1 bedside commode    Recommendations for Other Services       Precautions / Restrictions Precautions Precautions: Fall Restrictions Weight Bearing Restrictions: Yes RUE Weight Bearing: Non weight bearing RLE Weight Bearing: Weight bearing as tolerated Other Position/Activity Restrictions: weight bearing through R elbow okay      Mobility Bed Mobility Overal bed mobility: Needs Assistance Bed Mobility: Supine to Sit     Supine to sit: Max assist;+2 for physical assistance;+2 for safety/equipment     General bed mobility comments: maxA for RLE management and frequent cues for NWB through hand  Transfers Overall transfer level: Needs assistance Equipment used: 2 person hand held assist Transfers: Sit to/from [facility] Sit to Stand: Mod assist;+2 physical assistance;+2 safety/equipment Stand pivot transfers: +2  physical assistance;+2 safety/equipment;Max assist       General transfer comment: modA to powerup into standing, frequent cues for upright posture, pt demonstrated preference for pivoting feet as opposed to taking steps.    Balance Overall balance assessment: Needs assistance Sitting-balance support: No upper extremity supported;Feet supported Sitting balance-Leahy Scale: Fair     Standing balance support: Bilateral upper extremity supported;During functional activity Standing balance-Leahy Scale: Zero Standing balance comment: max+2 for standing support                           ADL either performed or assessed with clinical judgement   ADL Overall ADL's : Needs assistance/impaired Eating/Feeding: Set up;Sitting Eating/Feeding Details (indicate cue type and reason): required some assistance to open containers Grooming: Set up;Sitting   Upper Body Bathing: Minimal assistance;Sitting   Lower Body Bathing: Moderate assistance;Sit to/from stand;+2 for physical assistance;+2 for safety/equipment   Upper Body Dressing : Minimal assistance;Sitting   Lower Body Dressing: Moderate assistance;Sit to/from stand;+2 for physical assistance   Toilet Transfer: Moderate assistance;+2 for physical assistance;+2 for safety/equipment;Stand-pivot Toilet Transfer Details (indicate cue type and reason): simulated to recliner towards left Toileting- Clothing Manipulation and Hygiene: Moderate assistance;+2 for physical assistance;+2 for safety/equipment       Functional mobility during ADLs: Moderate assistance;+2 for safety/equipment;+2 for physical assistance General ADL Comments: pt limited by pain, limited ability to weight bear through RLE and decreased activity tolerance     Vision         Perception     Praxis      Pertinent Vitals/Pain Pain Assessment: Faces Faces Pain Scale: Hurts  little more Pain Location: RLE and RUE with mobility Pain Descriptors / Indicators:  Grimacing;Guarding Pain Intervention(s): Limited activity within patient's tolerance;Monitored during session     Hand Dominance Right   Extremity/Trunk Assessment Upper Extremity Assessment Upper Extremity Assessment: RUE deficits/detail;LUE deficits/detail RUE Deficits / Details: immobilized from forearm to MCP in splint, noted bruising on dorsal aspect of hand,noted increased edema in hand and digits, educated pt on strategies for edema control;full elbow and shoulder ROM RUE Coordination: decreased fine motor;decreased gross motor LUE Deficits / Details: noted bruising on dorsal aspect of hand and wrist, pt reports wrist is painful from fall LUE Coordination: decreased gross motor   Lower Extremity Assessment Lower Extremity Assessment: Defer to PT evaluation   Cervical / Trunk Assessment Cervical / Trunk Assessment: Kyphotic   Communication Communication Communication: No difficulties   Cognition Arousal/Alertness: Awake/alert Behavior During Therapy: WFL for tasks assessed/performed Overall Cognitive Status: Within Functional Limits for tasks assessed                                     General Comments  pt on 2lnc upon arrival, SpO2 >95%, pt on 1lnc rest of session SpO2 >90%, educated pt and daughter on monitoring O2 levels and pt focusing on pursed lip breathing     Exercises     Shoulder Instructions      Home Living Family/patient expects to be discharged to:: Skilled nursing facility Living Arrangements: Alone Available Help at Discharge: Family;Available 24 hours/day Type of Home: Apartment Home Access: Level entry     Home Layout: One level     Bathroom Shower/Tub: Walk-in shower         Home Equipment: Environmental consultant - 2 wheels;Shower seat          Prior Functioning/Environment Level of Independence: Independent with assistive device(s)        Comments: ambulated sometimes with cane, driving, did her own shopping, laundry, cooking         OT Problem List: Decreased activity tolerance;Decreased range of motion;Decreased safety awareness;Decreased knowledge of use of DME or AE;Decreased knowledge of precautions;Cardiopulmonary status limiting activity;Pain;Impaired UE functional use      OT Treatment/Interventions: Self-care/ADL training;Therapeutic exercise;Energy conservation;DME and/or AE instruction;Therapeutic activities;Patient/family education;Balance training    OT Goals(Current goals can be found in the care plan section) Acute Rehab OT Goals Patient Stated Goal: to get stronger OT Goal Formulation: With patient/family Time For Goal Achievement: 05/04/20 Potential to Achieve Goals: Good ADL Goals Pt Will Perform Grooming: with modified independence;standing Pt Will Perform Lower Body Dressing: sit to/from stand;with min guard assist Pt Will Transfer to Toilet: ambulating;with min assist Additional ADL Goal #1: Pt will progress to EOB with minguard assist in preparation for ADL.  OT Frequency: Min 2X/week   Barriers to D/C: Decreased caregiver support;Inaccessible home environment  pt will be living with her daughter who is in the process of moving       Co-evaluation PT/OT/SLP Co-Evaluation/Treatment: Yes Reason for Co-Treatment: Complexity of the patient's impairments (multi-system involvement);For patient/therapist safety;To address functional/ADL transfers   OT goals addressed during session: ADL's and self-care      AM-PAC OT "6 Clicks" Daily Activity     Outcome Measure Help from another person eating meals?: A Little Help from another person taking care of personal grooming?: A Little Help from another person toileting, which includes using toliet, bedpan, or urinal?: A Lot Help from another person bathing (  including washing, rinsing, drying)?: A Lot Help from another person to put on and taking off regular upper body clothing?: A Little Help from another person to put on and taking off  regular lower body clothing?: A Lot 6 Click Score: 15   End of Session Equipment Utilized During Treatment: Gait belt;Oxygen Nurse Communication: Mobility status(discussed O2 sats)  Activity Tolerance: Patient tolerated treatment well Patient left: in chair;with chair alarm set;with call bell/phone within reach;with family/visitor present  OT Visit Diagnosis: Unsteadiness on feet (R26.81);Other abnormalities of gait and mobility (R26.89);Muscle weakness (generalized) (M62.81);Pain Pain - Right/Left: Right Pain - part of body: Leg;Hand                Time: [id number] OT Time Calculation (min): 34 min Charges:  OT General Charges $OT Visit: 1 Visit OT Evaluation $OT Eval Moderate Complexity: [place] OTR/L Acute Rehabilitation Services Office: [place] 04/20/2020, 1:33 PM

## 2020-04-20 NOTE — TOC Initial Note (Signed)
Transition of Care [facility]) - Initial/Assessment Note    Patient Details  Name: Savannah Coleman MRN: [id number] Date of Birth: 04-Nov-1931  Transition of Care [facility]) CM/SW Contact:    Sharin Mons, RN Phone Number: 04/20/2020, 2:29 PM  Clinical Narrative:           Admitted after a fall, suffered R wrist and  R hip fx. Hx HTN,  R femur fx (2015). Residing with daughter Savannah Coleman. Independent with ADL's, no DME usage.      - s/p  right hip IM nail and right wrist ORIF  NCM received consult for possible SNF placement at time of discharge. NCM spoke with patient regarding PT recommendation of SNF placement at time of discharge. Patient reported that patient's daughter  is currently unable to care for patient at their home given patient's current physical needs and fall risk. Patient expressed understanding of PT recommendation and is agreeable to SNF placement at time of discharge. Patient reports preference for  [facility] . NCM discussed insurance authorization process and provided Medicare SNF ratings list. Patient expressed being hopeful for rehab and to feel better soon. No further questions reported at this time. NCM to continue to follow and assist with discharge planning needs.   Expected Discharge Plan: Skilled Nursing Facility Barriers to Discharge: Continued Medical Work up   Patient Goals and CMS Choice Patient states their goals for this hospitalization and ongoing recovery are:: Want to get better and get home CMS Medicare.gov Compare Post Acute Care list provided to:: Patient    Expected Discharge Plan and Services Expected Discharge Plan: [place]                                              Prior Living Arrangements/Services   Lives with:: Adult Children Patient language and need for interpreter reviewed:: Yes Do you feel safe going back to the place where you live?: Yes      Need for Family Participation in Patient Care: Yes  (Comment) Care giver support system in place?: Yes (comment)   Criminal Activity/Legal Involvement Pertinent to Current Situation/Hospitalization: No - Comment as needed  Activities of Daily Living Home Assistive Devices/Equipment: None ADL Screening (condition at time of admission) Patient's cognitive ability adequate to safely complete daily activities?: Yes Is the patient deaf or have difficulty hearing?: No Does the patient have difficulty seeing, even when wearing glasses/contacts?: No Does the patient have difficulty concentrating, remembering, or making decisions?: No Patient able to express need for assistance with ADLs?: Yes Does the patient have difficulty dressing or bathing?: Yes Independently performs ADLs?: No Communication: Independent Dressing (OT): Needs assistance Is this a change from baseline?: Change from baseline, expected to last <3days Grooming: Independent Feeding: Independent Bathing: Needs assistance Is this a change from baseline?: Change from baseline, expected to last >3 days Toileting: Needs assistance Is this a change from baseline?: Change from baseline, expected to last >3days In/Out Bed: Needs assistance Is this a change from baseline?: Change from baseline, expected to last >3 days Walks in Home: Needs assistance Is this a change from baseline?: Change from baseline, expected to last >3 days Does the patient have difficulty walking or climbing stairs?: Yes Weakness of Legs: Right Weakness of Arms/Hands: Right  Permission Sought/Granted Permission sought to share information with : Case Manager Permission granted to share information with :  Yes, Verbal Permission Granted  Share Information with NAME: Savannah Coleman ([id number] Savannah Coleman (Daughter) [phone]           Emotional Assessment Appearance:: Appears stated age Attitude/Demeanor/Rapport: Gracious   Orientation: : Oriented to Self, Oriented to Place, Oriented to   Time, Oriented to Situation Alcohol / Substance Use: Not Applicable Psych Involvement: No (comment)  Admission diagnosis:  Fall [W19.XXXA] Pre-op chest exam [id number] Closed avulsion fracture of lesser trochanter of right femur ([place]) [S72.121A] Patient Active Problem List   Diagnosis Date Noted  . Closed fracture of right distal radius 04/17/2020  . Unspecified constipation 05/05/2014  . Postoperative anemia due to acute blood loss 05/02/2014  . Femur fracture, right ([place]) 04/30/2014  . Hypertension   . Hyperlipidemia   . Adenomatous colon polyp   . History of shingles   . Rosacea   . Positive PPD   . Blood transfusion 03/19/2011   PCP:  Jonathon Resides, MD Pharmacy:   [facility] [place] [place] [address] Phone: [phone] Fax: [phone]     Social Determinants of Health (SDOH) Interventions    Readmission Risk Interventions No flowsheet data found.

## 2020-04-20 NOTE — Progress Notes (Signed)
Orthopaedic Trauma Service Progress Note  Patient ID: Savannah Coleman MRN: [id number] DOB/AGE: 1931-05-16 86 y.o.  Subjective:  Doing well No specific complaints  Denies back pain currently   States she is not having much pain currently   ROS As above Objective:   VITALS:   Vitals:   04/19/20 1925 04/20/20 0020 04/20/20 0500 04/20/20 1304  BP: 130/66 136/64 121/70 102/72  Pulse: 85 83 84 82  Resp: 16 16 16 16   Temp: 98 F (36.7 C) 98.2 F (36.8 C) 98.2 F (36.8 C) 98.3 F (36.8 C)  TempSrc: Oral Tympanic Oral Oral  SpO2: 98% 98% 98% 90%  Weight:      Height:        Estimated body mass index is 23.05 kg/m as calculated from the following:   Height as of this encounter: 5\' 2"  (1.575 m).   Weight as of this encounter: 57.2 kg.   Intake/Output      05/03 0701 - 05/04 0700 05/04 0701 - 05/05 0700   P.O.  240   IV Piggyback 500    Total Intake(mL/kg) 500 (8.7) 240 (4.2)   Urine (mL/kg/hr) 2010 (1.5)    Blood 125    Total Output 2135    Net -1635 +240        Urine Occurrence  1 x     LABS  Results for orders placed or performed during the hospital encounter of 04/17/20 (from the past 24 hour(s))  CBC with Differential/Platelet     Status: Abnormal   Collection Time: 04/20/20  6:52 AM  Result Value Ref Range   WBC 11.9 (H) 4.0 - 10.5 K/uL   RBC 2.43 (L) 3.87 - 5.11 MIL/uL   Hemoglobin 8.2 (L) 12.0 - 15.0 g/dL   HCT 24.9 (L) 36.0 - 46.0 %   MCV 102.5 (H) 80.0 - 100.0 fL   MCH 33.7 26.0 - 34.0 pg   MCHC 32.9 30.0 - 36.0 g/dL   RDW 13.2 11.5 - 15.5 %   Platelets 139 (L) 150 - 400 K/uL   nRBC 0.0 0.0 - 0.2 %   Neutrophils Relative % 78 %   Neutro Abs 9.3 (H) 1.7 - 7.7 K/uL   Lymphocytes Relative 9 %   Lymphs Abs 1.0 0.7 - 4.0 K/uL   Monocytes Relative 11 %   Monocytes Absolute 1.3 (H) 0.1 - 1.0 K/uL   Eosinophils Relative 1 %   Eosinophils Absolute 0.1 0.0 - 0.5 K/uL   Basophils Relative 0 %   Basophils Absolute 0.0 0.0 - 0.1 K/uL   Immature Granulocytes 1 %   Abs Immature Granulocytes 0.09 (H) 0.00 - 0.07 K/uL  Basic metabolic panel     Status: Abnormal   Collection Time: 04/20/20  6:52 AM  Result Value Ref Range   Sodium 135 135 - 145 mmol/L   Potassium 3.8 3.5 - 5.1 mmol/L   Chloride 101 98 - 111 mmol/L   CO2 27 22 - 32 mmol/L   Glucose, Bld 101 (H) 70 - 99 mg/dL   BUN 11 8 - 23 mg/dL   Creatinine, Ser 0.70 0.44 - 1.00 mg/dL   Calcium 9.2 8.9 - 10.3 mg/dL   GFR calc non Af Amer >60 >60 mL/min   GFR calc Af Amer >60 >60 mL/min   Anion gap  7 5 - 15  VITAMIN D 25 Hydroxy (Vit-D Deficiency, Fractures)     Status: None   Collection Time: 04/20/20  6:52 AM  Result Value Ref Range   Vit D, 25-Hydroxy 74.74 30 - 100 ng/mL     PHYSICAL EXAM:   Gen: sitting up in bed, NAD, appears well Lungs: unlabored Cardiac: reg  Ext:   Right Upper Extremity    Splint c/d/i   Moderate ecchymosis and swelling to R hand   No pain with passive stretch    Motor and sensory functions intact    Ext warm     Right Lower Extremity    Dressings c/d/i   Ext warm    Minimal swelling    + DP pulse   Compartments are soft    No pain with passive stretching   Distal motor and sensory functions intact       Assessment/Plan: 1 Day Post-Op   Principal Problem:   Intertrochanteric fracture of right hip (HCC) Active Problems:   Hypertension   Closed fracture of right distal radius   Anti-infectives (From admission, onward)   Start     Dose/Rate Route Frequency Ordered Stop   04/19/20 0731  ceFAZolin (ANCEF) 2-4 GM/100ML-% IVPB    Note to Pharmacy: Henrine Screws   : cabinet override      04/19/20 0731 04/19/20 0848   04/19/20 0730  ceFAZolin (ANCEF) IVPB 2g/100 mL premix     2 g 200 mL/hr over 30 Minutes Intravenous On call to O.R. 04/19/20 [id number] 04/19/20 0906    .  POD/HD#: 1  86 y/o RHD female s/p fall with R hip fracture and R distal radius fracture     - fall  - R periimplant R intertrochanteric hip fracture s/p ROH and IMN R hip   WBAT R leg  ROM as tolerated R hip and knee  PT/OT  Ice and elevate  Increase activity slowly   Dressing changes as needed  - R distal radius fracture s/p ORIF  NWB thru wrist   Ok to WB thru elbow (can use platform walker)  Digit, elbow, shoulder motion as tolerated  Ice and elevate  Splint x 2 weeks   Therapies    - Pain management:  Continue with current regimen   - ABL anemia/Hemodynamics  Monitor   Cbc in am   - Medical issues   Per primary   - DVT/PE prophylaxis:  Lovenox x 28 days   - ID:   periop abx  - Metabolic Bone Disease:  Vitamin d levels look good  Meets criteria for fracture liaison service consult  Recommend dexa in 4-8 weeks  - Activity:  Up with assistance  As above  - FEN/GI prophylaxis/Foley/Lines:  Reg diet  - Impediments to fracture healing:  Poor bone quality   - Dispo:  Continue with therapies  Will likely need snf     Jari Pigg, PA-C [phone] (C) 04/20/2020, 2:43 PM  [facility] [place] [address] [phone] Domingo Sep (F)

## 2020-04-20 NOTE — Progress Notes (Signed)
PROGRESS NOTE  Savannah LIBERTI [id number] DOB: Jan 30, 1931 DOA: 04/17/2020 PCP: Jonathon Resides, MD  HPI/Recap of past 24 hours: HPI from Dr Evon Slack is a 86 y.o. female with medical history significant of HTN, prior R femur fx in 2015. Pt presents to ED after she slipped and fell in shower.  Pain to both wrists and R femur. Unable to bear wait, pain worse with movement.  Couldn't bend or walk R leg. In the ED, noted a R wrist fx, and a much more complicated R perihardware lesser trochanter fracture of femur.  EDP consulted orthopedics.  Patient admitted for further management.    Today, patient reports postop pain is controlled, denied any new complaints.  Daughter at bedside.    Assessment/Plan: Principal Problem:   Intertrochanteric fracture of right hip [facility]) Active Problems:   Hypertension   Closed fracture of right distal radius   Right periprosthetic intertrochanteric hip fracture s/p surgery on 04/19/2020 S/p mechanical fall X-ray shows above CT head/cervical spine showed no acute fracture or intracranial abnormality Orthopedics on board Pain management, DVT PPx as per orthopedics PT/OT  Right wrist fracture s/p ORIF on 04/19/2020 X-ray showed mildly displaced transverse fracture of the right radial head Orthopedics on board, appreciate recs PT/OT  Leukocytosis Likely reactive from above Daily CBC  Macrocytic anemia/post op anemia Anemia panel unremarkable Daily CBC  Hypertension Continue home amlodipine  Hyperlipidemia Continue home statin  GERD Continue PPI       Malnutrition Type:  Nutrition Problem: Increased nutrient needs Etiology: post-op healing   Malnutrition Characteristics:  Signs/Symptoms: estimated needs   Nutrition Interventions:  Interventions: MVI, Ensure Enlive (each supplement provides 350kcal and 20 grams of protein)    Estimated body mass index is 23.05 kg/m as calculated from the following:  Height as of this encounter: 5\' 2"  (1.575 m).   Weight as of this encounter: 57.2 kg.     Code Status: Full  Family Communication: Daughter at bedside, discussed extensively on 04/20/2020  Disposition Plan: Status is: Inpatient  Remains inpatient appropriate because:Inpatient level of care appropriate due to severity of illness   Dispo: The patient is from: Home              Anticipated d/c is to: SNF              Anticipated d/c date is: 1 day              Patient currently is medically stable to d/c.  Pending ortho sign off, SNF placement    Consultants:  Orthopedics  Procedures:  None  Antimicrobials:  None  DVT prophylaxis: Lovenox   Objective: Vitals:   04/19/20 1925 04/20/20 0020 04/20/20 0500 04/20/20 1304  BP: 130/66 136/64 121/70 102/72  Pulse: 85 83 84 82  Resp: 16 16 16 16   Temp: 98 F (36.7 C) 98.2 F (36.8 C) 98.2 F (36.8 C) 98.3 F (36.8 C)  TempSrc: Oral Tympanic Oral Oral  SpO2: 98% 98% 98% 90%  Weight:      Height:        Intake/Output Summary (Last 24 hours) at 04/20/2020 1558 Last data filed at 04/20/2020 1500 Gross per 24 hour  Intake 480 ml  Output 1550 ml  Net -1070 ml   Filed Weights   04/17/20 1424 04/19/20 0654  Weight: 57.2 kg 57.2 kg    Exam:  General: NAD, laceration noted on R side of face  Cardiovascular: S1, S2 present  Respiratory:  CTAB  Abdomen: Soft, nontender, nondistended, bowel sounds present  Musculoskeletal: No bilateral pedal edema noted, right upper extremity wrapped in a splint, incision on right hip C/D/I  Skin: Normal  Psychiatry: Normal mood   Data Reviewed: CBC: Recent Labs  Lab 04/17/20 1600 04/18/20 0349 04/19/20 0555 04/20/20 0652  WBC 11.9* 9.9 9.1 11.9*  NEUTROABS 9.7*  --  7.1 9.3*  HGB 12.2 10.9* 10.4* 8.2*  HCT 38.1 32.7* 31.6* 24.9*  MCV 101.1* 100.6* 101.3* 102.5*  PLT 219 207 144* [id number]*   Basic Metabolic Panel: Recent Labs  Lab 04/17/20 1600 04/18/20 0349  04/19/20 0555 04/20/20 0652  NA 141 139 135 135  K 3.7 4.1 3.4* 3.8  CL 104 103 99 101  CO2 23 25 28 27   GLUCOSE 114* 126* 173* 101*  BUN 14 13 8 11   CREATININE 0.71 0.84 0.75 0.70  CALCIUM 9.3 9.4 9.3 9.2   GFR: Estimated Creatinine Clearance: 39.9 mL/min (by C-G formula based on SCr of 0.7 mg/dL). Liver Function Tests: Recent Labs  Lab 04/17/20 1600  AST 43*  ALT 23  ALKPHOS 56  BILITOT 0.9  PROT 6.4*  ALBUMIN 3.9   No results for input(s): LIPASE, AMYLASE in the last 168 hours. No results for input(s): AMMONIA in the last 168 hours. Coagulation Profile: No results for input(s): INR, PROTIME in the last 168 hours. Cardiac Enzymes: No results for input(s): CKTOTAL, CKMB, CKMBINDEX, TROPONINI in the last 168 hours. BNP (last 3 results) No results for input(s): PROBNP in the last 8760 hours. HbA1C: No results for input(s): HGBA1C in the last 72 hours. CBG: No results for input(s): GLUCAP in the last 168 hours. Lipid Profile: No results for input(s): CHOL, HDL, LDLCALC, TRIG, CHOLHDL, LDLDIRECT in the last 72 hours. Thyroid Function Tests: No results for input(s): TSH, T4TOTAL, FREET4, T3FREE, THYROIDAB in the last 72 hours. Anemia Panel: Recent Labs    04/19/20 0555  VITAMINB12 615  FOLATE 24.4  FERRITIN 72  TIBC 295  IRON 33   Urine analysis:    Component Value Date/Time   COLORURINE YELLOW 12/12/2015 1057   APPEARANCEUR CLOUDY (A) 12/12/2015 1057   LABSPEC 1.015 12/12/2015 1057   PHURINE 5.0 12/12/2015 1057   GLUCOSEU NEGATIVE 12/12/2015 1057   HGBUR TRACE (A) 12/12/2015 1057   BILIRUBINUR NEGATIVE 12/12/2015 1057   BILIRUBINUR small 01/17/2015 1139   KETONESUR NEGATIVE 12/12/2015 1057   PROTEINUR NEGATIVE 12/12/2015 1057   UROBILINOGEN 1.0 01/17/2015 1139   UROBILINOGEN 0.2 05/02/2014 1652   NITRITE NEGATIVE 12/12/2015 1057   LEUKOCYTESUR NEGATIVE 12/12/2015 1057   Sepsis Labs: @LABRCNTIP (procalcitonin:4,lacticidven:4)  ) Recent Results (from  the past 240 hour(s))  Respiratory Panel by RT PCR (Flu A&B, Covid) - Nasopharyngeal Swab     Status: None   Collection Time: 04/17/20  4:48 PM   Specimen: Nasopharyngeal Swab  Result Value Ref Range Status   SARS Coronavirus 2 by RT PCR NEGATIVE NEGATIVE Final    Comment: (NOTE) SARS-CoV-2 target nucleic acids are NOT DETECTED. The SARS-CoV-2 RNA is generally detectable in upper respiratoy specimens during the acute phase of infection. The lowest concentration of SARS-CoV-2 viral copies this assay can detect is 131 copies/mL. A negative result does not preclude SARS-Cov-2 infection and should not be used as the sole basis for treatment or other patient management decisions. A negative result may occur with  improper specimen collection/handling, submission of specimen other than nasopharyngeal swab, presence of viral mutation(s) within the areas targeted by this assay, and inadequate  number of viral copies (<131 copies/mL). A negative result must be combined with clinical observations, patient history, and epidemiological information. The expected result is Negative. Fact Sheet for Patients:  PinkCheek.be Fact Sheet for Healthcare Providers:  GravelBags.it This test is not yet ap proved or cleared by the [place] FDA and  has been authorized for detection and/or diagnosis of SARS-CoV-2 by FDA under an Emergency Use Authorization (EUA). This EUA will remain  in effect (meaning this test can be used) for the duration of the COVID-19 declaration under Section 564(b)(1) of the Act, 21 U.S.C. section 360bbb-3(b)(1), unless the authorization is terminated or revoked sooner.    Influenza A by PCR NEGATIVE NEGATIVE Final   Influenza B by PCR NEGATIVE NEGATIVE Final    Comment: (NOTE) The Xpert Xpress SARS-CoV-2/FLU/RSV assay is intended as an aid in  the diagnosis of influenza from Nasopharyngeal swab specimens and  should  not be used as a sole basis for treatment. Nasal washings and  aspirates are unacceptable for Xpert Xpress SARS-CoV-2/FLU/RSV  testing. Fact Sheet for Patients: PinkCheek.be Fact Sheet for Healthcare Providers: GravelBags.it This test is not yet approved or cleared by the [place] FDA and  has been authorized for detection and/or diagnosis of SARS-CoV-2 by  FDA under an Emergency Use Authorization (EUA). This EUA will remain  in effect (meaning this test can be used) for the duration of the  Covid-19 declaration under Section 564(b)(1) of the Act, 21  U.S.C. section 360bbb-3(b)(1), unless the authorization is  terminated or revoked. Performed at [facility], [place] [address], [place], [place] [address]   Surgical PCR screen     Status: None   Collection Time: 04/17/20 10:31 PM   Specimen: Nasal Mucosa; Nasal Swab  Result Value Ref Range Status   MRSA, PCR NEGATIVE NEGATIVE Final   Staphylococcus aureus NEGATIVE NEGATIVE Final    Comment: (NOTE) The Xpert SA Assay (FDA approved for NASAL specimens in patients 12 years of age and older), is one component of a comprehensive surveillance program. It is not intended to diagnose infection nor to guide or monitor treatment. Performed at [facility], [place] [address], [place], [place] [address]       Studies: DG Wrist Complete Right  Result Date: 04/19/2020 CLINICAL DATA:  Postop EXAM: RIGHT WRIST - COMPLETE 3+ VIEW COMPARISON:  04/17/2020 FINDINGS: Plate and screw fixation across distal right radial fracture. Anatomic alignment. No complicating feature. IMPRESSION: Internal fixation.  No visible complicating feature. Electronically Signed   By: Rolm Baptise M.D.   On: 04/19/2020 22:01   DG HIP UNILAT WITH PELVIS 1V RIGHT  Result Date: 04/19/2020 CLINICAL DATA:  Postop EXAM: DG HIP (WITH OR WITHOUT PELVIS) 1V RIGHT COMPARISON:  04/20/2019 FINDINGS: Intramedullary  nail and hip screw in place. This crosses the right femoral intertrochanteric fracture. Displaced lesser trochanter. No hardware complicating feature. IMPRESSION: Internal fixation.  No hardware complicating feature. Electronically Signed   By: Rolm Baptise M.D.   On: 04/19/2020 21:58   DG FEMUR, MIN 2 VIEWS RIGHT  Result Date: 04/19/2020 CLINICAL DATA:  Postop EXAM: RIGHT FEMUR 2 VIEWS COMPARISON:  None. FINDINGS: Intramedullary nail and dynamic hip screw in place. This crosses the right femoral intertrochanteric fracture. No hardware complicating feature. Prior right knee replacement. IMPRESSION: Internal fixation.  No visible complicating feature. Electronically Signed   By: Rolm Baptise M.D.   On: 04/19/2020 21:59    Scheduled Meds: . acetaminophen  500 mg Oral Q12H  .  amLODipine  5 mg Oral Daily  . bacitracin   Topical BID  . feeding supplement (ENSURE ENLIVE)  237 mL Oral BID BM  . multivitamin with minerals  1 tablet Oral Daily  . mupirocin ointment  1 application Nasal BID  . pantoprazole  40 mg Oral Daily  . simvastatin  20 mg Oral QHS    Continuous Infusions:   LOS: 3 days     Alma Friendly, MD [facility]  If 7PM-7AM, please contact night-coverage [email or web address] 04/20/2020, 3:58 PM

## 2020-04-20 NOTE — Plan of Care (Signed)
  Problem: Activity: Goal: Ability to ambulate and perform ADLs will improve Outcome: Progressing   Problem: Self-Concept: Goal: Ability to maintain and perform role responsibilities to the fullest extent possible will improve Outcome: Progressing   Problem: Pain Management: Goal: Pain level will decrease Outcome: Progressing   

## 2020-04-21 ENCOUNTER — Encounter: Payer: Self-pay | Admitting: *Deleted

## 2020-04-21 DIAGNOSIS — T148XXA Other injury of unspecified body region, initial encounter: Secondary | ICD-10-CM

## 2020-04-21 LAB — BASIC METABOLIC PANEL
Anion gap: 7 (ref 5–15)
BUN: 15 mg/dL (ref 8–23)
CO2: 29 mmol/L (ref 22–32)
Calcium: 9.2 mg/dL (ref 8.9–10.3)
Chloride: 100 mmol/L (ref 98–111)
Creatinine, Ser: 0.66 mg/dL (ref 0.44–1.00)
GFR calc Af Amer: >60 mL/min (ref >60)
GFR calc non Af Amer: >60 mL/min (ref >60)
Glucose, Bld: 104 mg/dL — ABNORMAL HIGH (ref 70–99)
Potassium: 3.7 mmol/L (ref 3.5–5.1)
Sodium: 136 mmol/L (ref 135–145)

## 2020-04-21 LAB — CBC WITH DIFFERENTIAL/PLATELET
Abs Immature Granulocytes: 0.05 10*3/uL (ref 0.00–0.07)
Basophils Absolute: 0 10*3/uL (ref 0.0–0.1)
Basophils Relative: 0 %
Eosinophils Absolute: 0.5 10*3/uL (ref 0.0–0.5)
Eosinophils Relative: 5 %
HCT: 24.6 % — ABNORMAL LOW (ref 36.0–46.0)
Hemoglobin: 8.1 g/dL — ABNORMAL LOW (ref 12.0–15.0)
Immature Granulocytes: 1 %
Lymphocytes Relative: 10 %
Lymphs Abs: 1 10*3/uL (ref 0.7–4.0)
MCH: 33.6 pg (ref 26.0–34.0)
MCHC: 32.9 g/dL (ref 30.0–36.0)
MCV: 102.1 fL — ABNORMAL HIGH (ref 80.0–100.0)
Monocytes Absolute: 1.4 10*3/uL — ABNORMAL HIGH (ref 0.1–1.0)
Monocytes Relative: 14 %
Neutro Abs: 7.1 10*3/uL (ref 1.7–7.7)
Neutrophils Relative %: 70 %
Platelets: 157 10*3/uL (ref 150–400)
RBC: 2.41 MIL/uL — ABNORMAL LOW (ref 3.87–5.11)
RDW: 13.3 % (ref 11.5–15.5)
WBC: 10 10*3/uL (ref 4.0–10.5)
nRBC: 0 % (ref 0.0–0.2)

## 2020-04-21 NOTE — Plan of Care (Signed)
  Problem: Activity: Goal: Ability to ambulate and perform ADLs will improve 04/21/2020 2317 by Josepha Pigg, RN Outcome: Progressing 04/21/2020 2317 by Josepha Pigg, RN Outcome: Progressing   Problem: Self-Concept: Goal: Ability to maintain and perform role responsibilities to the fullest extent possible will improve 04/21/2020 2317 by Josepha Pigg, RN Outcome: Progressing 04/21/2020 2317 by Josepha Pigg, RN Outcome: Progressing   Problem: Pain Management: Goal: Pain level will decrease 04/21/2020 2317 by Josepha Pigg, RN Outcome: Progressing 04/21/2020 2317 by Josepha Pigg, RN Outcome: Progressing

## 2020-04-21 NOTE — TOC Progression Note (Signed)
Transition of Care [facility]) - Progression Note    Patient Details  Name: DANIEL DESJARDIN MRN: [id number] Date of Birth: [birth year]  Transition of [facility] CM/SW Contact  Carles Collet, RN Phone Number: 04/21/2020, 11:28 AM  Clinical Narrative:   Damaris Schooner w patient and daughter at bedside. Reviewed SNF scores on medicare website that have accepted that take [employer] [facility] does not). They chose [facility], sent forms through [facility], and spoke w Sue Lush there to notify her that Josem Kaufmann would need started as [facility] may take several days.  Patient and daughter aware that Josem Kaufmann could take several days.     Expected Discharge Plan: Skilled Nursing Facility Barriers to Discharge: Continued Medical Work up  Expected Discharge Plan and Services Expected Discharge Plan: [place]                                               Social Determinants of Health (SDOH) Interventions    Readmission Risk Interventions No flowsheet data found.

## 2020-04-21 NOTE — Plan of Care (Signed)
  Problem: Activity: Goal: Ability to ambulate and perform ADLs will improve Outcome: Progressing   Problem: Self-Concept: Goal: Ability to maintain and perform role responsibilities to the fullest extent possible will improve Outcome: Progressing   Problem: Pain Management: Goal: Pain level will decrease Outcome: Progressing   

## 2020-04-21 NOTE — Progress Notes (Signed)
PROGRESS NOTE    Savannah Coleman  [id number] DOB: [birth year] DOA: 04/17/2020 PCP: Jonathon Resides, Savannah Coleman    Brief Narrative:  86 y.o.femalewith medical history significant ofHTN, prior R femur fx in 2015. Pt presents to ED after she slipped and fell in shower. Pain to both wrists and R femur. Unable to bear wait, pain worse with movement. Couldn't bend or walk R leg. In the ED, noted a R wrist fx, and a much more complicated R perihardware lesser trochanter fracture of femur.  EDP consulted orthopedics.  Patient admitted for further management.  Assessment & Plan:   Principal Problem:   Intertrochanteric fracture of right hip [facility]) Active Problems:   Hypertension   Closed fracture of right distal radius   Right periprosthetic intertrochanteric hip fracture s/p surgery on 04/19/2020 S/p mechanical fall X-ray with avulsion fx of the lesser trochanter of the R femur at the level of the proximal threaded screw CT head/cervical spine showed no acute fracture or intracranial abnormality Orthopedics on board. Pt now s/p surgery on 5/3 Pain management, DVT PPx as per orthopedics PT/OT following, recs for SNF. SW following  Right wrist fracture s/p ORIF on 04/19/2020 X-ray showed mildly displaced transverse fracture of the right radial head Orthopedics on board, cont to follow recs PT/OT following  Leukocytosis Likely reactive secondary to above fractures Daily CBC  Macrocytic anemia/post op anemia Anemia panel unremarkable Continue to follow CBC  Hypertension Continue home amlodipine BP stable and controlled at this time  Hyperlipidemia Continue home statin as tolerated  GERD Continue PPI as tolerated  DVT prophylaxis: Lovenox subQ Code Status: Full Family Communication: Pt in room, family at bedside  Status is: Inpatient  Remains inpatient appropriate because:Unsafe d/c plan, awaiting SNF bed   Dispo: The patient is from: Home              Anticipated  d/c is to: SNF              Anticipated d/c date is: 1 day              Patient currently is medically stable to d/c.        Consultants:   Orthopedic Surgery  Procedures:  1. RIGHT HIP ANTEGRADE INTRAMEDULLARY NAILING WITH BIOMET AFFIXUS 11 MM X 360 MM  2. REMOVAL OF BIOMET RETROGRADE NAIL (Right) 3. Open Reduction Internal Fixation (Orif) Wrist Fracture (Right) DISTAL RADIUS DVR PLATE  Antimicrobials: Anti-infectives (From admission, onward)   Start     Dose/Rate Route Frequency Ordered Stop   04/19/20 0731  ceFAZolin (ANCEF) 2-4 GM/100ML-% IVPB    Note to Pharmacy: Henrine Screws   : cabinet override      04/19/20 0731 04/19/20 0848   04/19/20 0730  ceFAZolin (ANCEF) IVPB 2g/100 mL premix     2 g 200 mL/hr over 30 Minutes Intravenous On call to O.R. 04/19/20 0728 04/19/20 0906       Subjective: Eager to start therapy  Objective: Vitals:   04/20/20 1304 04/20/20 1948 04/21/20 0421 04/21/20 1246  BP: 102/72 116/63 (!) 142/70 (!) 101/59  Pulse: 82 87 87 72  Resp: 16 16 16 17   Temp: 98.3 F (36.8 C) 99 F (37.2 C) 98.3 F (36.8 C) 97.9 F (36.6 C)  TempSrc: Oral Oral Oral Oral  SpO2: 90% 93% 98% 96%  Weight:      Height:        Intake/Output Summary (Last 24 hours) at 04/21/2020 1543 Last data filed at 04/21/2020 1300  Gross per 24 hour  Intake 720 ml  Output 1500 ml  Net -780 ml   Filed Weights   04/17/20 1424 04/19/20 0654  Weight: 57.2 kg 57.2 kg    Examination:  General exam: Appears calm and comfortable  Respiratory system: Clear to auscultation. Respiratory effort normal. Cardiovascular system: S1 & S2 heard, Regular Gastrointestinal system: Abdomen is nondistended, soft and nontender. No organomegaly or masses felt. Normal bowel sounds heard. Central nervous system: Alert and oriented. No focal neurological deficits. Extremities: Symmetric 5 x 5 power, B UE with ecchymosis Skin: No rashes, lesions  Psychiatry: Judgement and insight appear normal.  Mood & affect appropriate.   Data Reviewed: I have personally reviewed following labs and imaging studies  CBC: Recent Labs  Lab 04/17/20 1600 04/18/20 0349 04/19/20 0555 04/20/20 0652 04/21/20 0510  WBC 11.9* 9.9 9.1 11.9* 10.0  NEUTROABS 9.7*  --  7.1 9.3* 7.1  HGB 12.2 10.9* 10.4* 8.2* 8.1*  HCT 38.1 32.7* 31.6* 24.9* 24.6*  MCV 101.1* 100.6* 101.3* 102.5* 102.1*  PLT 219 207 144* 139* [id number]   Basic Metabolic Panel: Recent Labs  Lab 04/17/20 1600 04/18/20 0349 04/19/20 0555 04/20/20 0652 04/21/20 0510  NA 141 139 135 135 136  K 3.7 4.1 3.4* 3.8 3.7  CL 104 103 99 101 100  CO2 23 25 28 27 29   GLUCOSE 114* 126* 173* 101* 104*  BUN 14 13 8 11 15   CREATININE 0.71 0.84 0.75 0.70 0.66  CALCIUM 9.3 9.4 9.3 9.2 9.2   GFR: Estimated Creatinine Clearance: 39.9 mL/min (by C-G formula based on SCr of 0.66 mg/dL). Liver Function Tests: Recent Labs  Lab 04/17/20 1600  AST 43*  ALT 23  ALKPHOS 56  BILITOT 0.9  PROT 6.4*  ALBUMIN 3.9   No results for input(s): LIPASE, AMYLASE in the last 168 hours. No results for input(s): AMMONIA in the last 168 hours. Coagulation Profile: No results for input(s): INR, PROTIME in the last 168 hours. Cardiac Enzymes: No results for input(s): CKTOTAL, CKMB, CKMBINDEX, TROPONINI in the last 168 hours. BNP (last 3 results) No results for input(s): PROBNP in the last 8760 hours. HbA1C: No results for input(s): HGBA1C in the last 72 hours. CBG: No results for input(s): GLUCAP in the last 168 hours. Lipid Profile: No results for input(s): CHOL, HDL, LDLCALC, TRIG, CHOLHDL, LDLDIRECT in the last 72 hours. Thyroid Function Tests: No results for input(s): TSH, T4TOTAL, FREET4, T3FREE, THYROIDAB in the last 72 hours. Anemia Panel: Recent Labs    04/19/20 0555  VITAMINB12 615  FOLATE 24.4  FERRITIN 72  TIBC 295  IRON 33   Sepsis Labs: No results for input(s): PROCALCITON, LATICACIDVEN in the last 168 hours.  Recent Results (from  the past 240 hour(s))  Respiratory Panel by RT PCR (Flu A&B, Covid) - Nasopharyngeal Swab     Status: None   Collection Time: 04/17/20  4:48 PM   Specimen: Nasopharyngeal Swab  Result Value Ref Range Status   SARS Coronavirus 2 by RT PCR NEGATIVE NEGATIVE Final    Comment: (NOTE) SARS-CoV-2 target nucleic acids are NOT DETECTED. The SARS-CoV-2 RNA is generally detectable in upper respiratoy specimens during the acute phase of infection. The lowest concentration of SARS-CoV-2 viral copies this assay can detect is 131 copies/mL. A negative result does not preclude SARS-Cov-2 infection and should not be used as the sole basis for treatment or other patient management decisions. A negative result may occur with  improper specimen collection/handling, submission of specimen  other than nasopharyngeal swab, presence of viral mutation(s) within the areas targeted by this assay, and inadequate number of viral copies (<131 copies/mL). A negative result must be combined with clinical observations, patient history, and epidemiological information. The expected result is Negative. Fact Sheet for Patients:  PinkCheek.be Fact Sheet for Healthcare Providers:  GravelBags.it This test is not yet ap proved or cleared by the [place] FDA and  has been authorized for detection and/or diagnosis of SARS-CoV-2 by FDA under an Emergency Use Authorization (EUA). This EUA will remain  in effect (meaning this test can be used) for the duration of the COVID-19 declaration under Section 564(b)(1) of the Act, 21 U.S.C. section 360bbb-3(b)(1), unless the authorization is terminated or revoked sooner.    Influenza A by PCR NEGATIVE NEGATIVE Final   Influenza B by PCR NEGATIVE NEGATIVE Final    Comment: (NOTE) The Xpert Xpress SARS-CoV-2/FLU/RSV assay is intended as an aid in  the diagnosis of influenza from Nasopharyngeal swab specimens and  should  not be used as a sole basis for treatment. Nasal washings and  aspirates are unacceptable for Xpert Xpress SARS-CoV-2/FLU/RSV  testing. Fact Sheet for Patients: PinkCheek.be Fact Sheet for Healthcare Providers: GravelBags.it This test is not yet approved or cleared by the [place] FDA and  has been authorized for detection and/or diagnosis of SARS-CoV-2 by  FDA under an Emergency Use Authorization (EUA). This EUA will remain  in effect (meaning this test can be used) for the duration of the  Covid-19 declaration under Section 564(b)(1) of the Act, 21  U.S.C. section 360bbb-3(b)(1), unless the authorization is  terminated or revoked. Performed at [facility], [place] [address] [place], [place] [address]   Surgical PCR screen     Status: None   Collection Time: 04/17/20 10:31 PM   Specimen: Nasal Mucosa; Nasal Swab  Result Value Ref Range Status   MRSA, PCR NEGATIVE NEGATIVE Final   Staphylococcus aureus NEGATIVE NEGATIVE Final    Comment: (NOTE) The Xpert SA Assay (FDA approved for NASAL specimens in patients 40 years of age and older), is one component of a comprehensive surveillance program. It is not intended to diagnose infection nor to guide or monitor treatment. Performed at [facility], [place] [address] [place], [place] [address]      Radiology Studies: DG Wrist Complete Right  Result Date: 04/19/2020 CLINICAL DATA:  Postop EXAM: RIGHT WRIST - COMPLETE 3+ VIEW COMPARISON:  04/17/2020 FINDINGS: Plate and screw fixation across distal right radial fracture. Anatomic alignment. No complicating feature. IMPRESSION: Internal fixation.  No visible complicating feature. Electronically Signed   By: Rolm Baptise M.D.   On: 04/19/2020 22:01   DG HIP UNILAT WITH PELVIS 1V RIGHT  Result Date: 04/19/2020 CLINICAL DATA:  Postop EXAM: DG HIP (WITH OR WITHOUT PELVIS) 1V RIGHT COMPARISON:  04/20/2019 FINDINGS:  Intramedullary nail and hip screw in place. This crosses the right femoral intertrochanteric fracture. Displaced lesser trochanter. No hardware complicating feature. IMPRESSION: Internal fixation.  No hardware complicating feature. Electronically Signed   By: Rolm Baptise M.D.   On: 04/19/2020 21:58   DG FEMUR, MIN 2 VIEWS RIGHT  Result Date: 04/19/2020 CLINICAL DATA:  Postop EXAM: RIGHT FEMUR 2 VIEWS COMPARISON:  None. FINDINGS: Intramedullary nail and dynamic hip screw in place. This crosses the right femoral intertrochanteric fracture. No hardware complicating feature. Prior right knee replacement. IMPRESSION: Internal fixation.  No visible complicating feature. Electronically Signed   By: Rolm Baptise M.D.  On: 04/19/2020 21:59    Scheduled Meds: . acetaminophen  500 mg Oral Q12H  . amLODipine  5 mg Oral Daily  . bacitracin   Topical BID  . enoxaparin (LOVENOX) injection  40 mg Subcutaneous Q24H  . feeding supplement (ENSURE ENLIVE)  237 mL Oral BID BM  . multivitamin with minerals  1 tablet Oral Daily  . mupirocin ointment  1 application Nasal BID  . pantoprazole  40 mg Oral Daily  . simvastatin  20 mg Oral QHS   Continuous Infusions:   LOS: 4 days   Savannah Lund, Savannah Coleman [facility] Pager On Amion  If 7PM-7AM, please contact night-coverage 04/21/2020, 3:43 PM

## 2020-04-21 NOTE — Progress Notes (Signed)
Orthopaedic Trauma Service Progress Note  Patient ID: Savannah Coleman MRN: [id number] DOB/AGE: 05-26-1931 86 y.o.  Subjective:  Rough night Moderate pain this am   No CP or SOB No N/V   ROS As above   Objective:   VITALS:   Vitals:   04/20/20 0500 04/20/20 1304 04/20/20 1948 04/21/20 0421  BP: 121/70 102/72 116/63 (!) 142/70  Pulse: 84 82 87 87  Resp: 16 16 16 16   Temp: 98.2 F (36.8 C) 98.3 F (36.8 C) 99 F (37.2 C) 98.3 F (36.8 C)  TempSrc: Oral Oral Oral Oral  SpO2: 98% 90% 93% 98%  Weight:      Height:        Estimated body mass index is 23.05 kg/m as calculated from the following:   Height as of this encounter: 5\' 2"  (1.575 m).   Weight as of this encounter: 57.2 kg.   Intake/Output      05/04 0701 - 05/05 0700 05/05 0701 - 05/06 0700   P.O. 720    IV Piggyback     Total Intake(mL/kg) 720 (12.6)    Urine (mL/kg/hr) 1000 (0.7)    Blood     Total Output 1000    Net -280         Urine Occurrence 1 x      LABS  Results for orders placed or performed during the hospital encounter of 04/17/20 (from the past 24 hour(s))  CBC with Differential/Platelet     Status: Abnormal   Collection Time: 04/21/20  5:10 AM  Result Value Ref Range   WBC 10.0 4.0 - 10.5 K/uL   RBC 2.41 (L) 3.87 - 5.11 MIL/uL   Hemoglobin 8.1 (L) 12.0 - 15.0 g/dL   HCT 24.6 (L) 36.0 - 46.0 %   MCV 102.1 (H) 80.0 - 100.0 fL   MCH 33.6 26.0 - 34.0 pg   MCHC 32.9 30.0 - 36.0 g/dL   RDW 13.3 11.5 - 15.5 %   Platelets 157 150 - 400 K/uL   nRBC 0.0 0.0 - 0.2 %   Neutrophils Relative % 70 %   Neutro Abs 7.1 1.7 - 7.7 K/uL   Lymphocytes Relative 10 %   Lymphs Abs 1.0 0.7 - 4.0 K/uL   Monocytes Relative 14 %   Monocytes Absolute 1.4 (H) 0.1 - 1.0 K/uL   Eosinophils Relative 5 %   Eosinophils Absolute 0.5 0.0 - 0.5 K/uL   Basophils Relative 0 %   Basophils Absolute 0.0 0.0 - 0.1 K/uL   Immature  Granulocytes 1 %   Abs Immature Granulocytes 0.05 0.00 - 0.07 K/uL  Basic metabolic panel     Status: Abnormal   Collection Time: 04/21/20  5:10 AM  Result Value Ref Range   Sodium 136 135 - 145 mmol/L   Potassium 3.7 3.5 - 5.1 mmol/L   Chloride 100 98 - 111 mmol/L   CO2 29 22 - 32 mmol/L   Glucose, Bld 104 (H) 70 - 99 mg/dL   BUN 15 8 - 23 mg/dL   Creatinine, Ser 0.66 0.44 - 1.00 mg/dL   Calcium 9.2 8.9 - 10.3 mg/dL   GFR calc non Af Amer >60 >60 mL/min   GFR calc Af Amer >60 >60 mL/min   Anion gap 7 5 - 15  PHYSICAL EXAM:   Gen: in bed, NAD  Lungs: unlabored Cardiac: reg  Ext:              Right Upper Extremity                          Splint c/d/i                         Moderate ecchymosis and swelling to R hand                         No pain with passive stretch                          Motor and sensory functions intact                          Ext warm                           Right Lower Extremity                          Dressings c/d/i                         Ext warm                          Minimal swelling                          + DP pulse                         Compartments are soft                          No pain with passive stretching                         Distal motor and sensory functions intact    Assessment/Plan: 2 Days Post-Op   Principal Problem:   Intertrochanteric fracture of right hip (HCC) Active Problems:   Hypertension   Closed fracture of right distal radius   Anti-infectives (From admission, onward)   Start     Dose/Rate Route Frequency Ordered Stop   04/19/20 0731  ceFAZolin (ANCEF) 2-4 GM/100ML-% IVPB    Note to Pharmacy: Henrine Screws   : cabinet override      04/19/20 0731 04/19/20 0848   04/19/20 0730  ceFAZolin (ANCEF) IVPB 2g/100 mL premix     2 g 200 mL/hr over 30 Minutes Intravenous On call to O.R. 04/19/20 [id number] 04/19/20 0906    .  POD/HD#: 2  86 y/o RHD female s/p fall with R hip fracture and R distal  radius fracture    - fall   - R periimplant R intertrochanteric hip fracture s/p ROH and IMN R hip              WBAT R leg             ROM as tolerated R hip and knee  PT/OT             Ice and elevate             Increase activity slowly              Dressing changes as needed   - R distal radius fracture s/p ORIF             NWB thru wrist              Ok to WB thru elbow (can use platform walker)             Digit, elbow, shoulder motion as tolerated             Ice and elevate             Splint x 2 weeks              Therapies      - Pain management:             Continue with current regimen    - ABL anemia/Hemodynamics             Monitor              Cbc in am    - Medical issues              Per primary    - DVT/PE prophylaxis:             Lovenox x 28 days    - ID:              periop abx   - Metabolic Bone Disease:             Vitamin d levels look good             Meets criteria for fracture liaison service consult             Recommend dexa in 4-8 weeks   - Activity:             Up with assistance             As above   - FEN/GI prophylaxis/Foley/Lines:             Reg diet   - Impediments to fracture healing:             Poor bone quality    - Dispo:             Continue with therapies             Will likely need snf      Jari Pigg, PA-C [phone] (C) 04/21/2020, 8:50 AM  [facility] [place] [id number] [phone] Domingo Sep (F)

## 2020-04-21 NOTE — Progress Notes (Signed)
Physical Therapy Treatment Patient Details Name: Savannah Coleman MRN: [id number] DOB: [birth year] Today's Date: 04/21/2020    History of Present Illness Savannah Coleman is a 86 y.o. female with medical history significant of HTN, prior R femur fx in 2015. Pt presents to ED after she slipped and fell in shower. S/p right hip IM nail and right wrist ORIF.    PT Comments    Pt supine in bed on arrival this session.  Pt is very comical and required frequent cues to redirect.  Pt continues to benefit from SNF placement to improve strength and function before returning home.  She is showing slow progress from previous session but did progress to gt this morning.     Follow Up Recommendations  SNF     Equipment Recommendations  Rolling walker with 5" wheels    Recommendations for Other Services       Precautions / Restrictions Precautions Precautions: Fall Precaution Comments: pt fell in shower  Restrictions Weight Bearing Restrictions: Yes RUE Weight Bearing: Weight bear through elbow only(otherwise NWB) RLE Weight Bearing: Weight bearing as tolerated Other Position/Activity Restrictions: weight bearing through R elbow okay    Mobility  Bed Mobility Overal bed mobility: Needs Assistance Bed Mobility: Supine to Sit;Sit to Supine     Supine to sit: Mod assist;+2 for physical assistance     General bed mobility comments: Mod +2 for LE advancement and trunk elevation.  Transfers Overall transfer level: Needs assistance Equipment used: Right platform walker Transfers: Sit to/from Stand Sit to Stand: Mod assist;+2 physical assistance Stand pivot transfers: Max assist;+2 safety/equipment(to turn and sit on commode due to urinary incontinence.)       General transfer comment: Mod assistance from bed and mod +1 from commode chair.  Pt required cues for placement of R elbow on platform pad.  Ambulation/Gait Ambulation/Gait assistance: +2 safety/equipment;Max assist Gait  Distance (Feet): 6 Feet Assistive device: Rolling walker (2 wheeled) Gait Pattern/deviations: Step-to pattern;Shuffle;Decreased stride length;Decreased step length - left;Decreased stance time - right     General Gait Details: Pt required max assistance to weight shift to allow for forward progression of steps.  Pt very limited due to pain and fatigue but did progress from last session.   Stairs             Wheelchair Mobility    Modified Rankin (Stroke Patients Only)       Balance Overall balance assessment: Needs assistance Sitting-balance support: No upper extremity supported;Feet supported Sitting balance-Leahy Scale: Fair       Standing balance-Leahy Scale: Zero                              Cognition Arousal/Alertness: Awake/alert Behavior During Therapy: WFL for tasks assessed/performed Overall Cognitive Status: Impaired/Different from baseline Area of Impairment: Memory;Safety/judgement                     Memory: Decreased short-term memory   Safety/Judgement: Decreased awareness of deficits;Decreased awareness of safety     General Comments: Pt remains with a good sense of humor and requires redirection during her intemittent one liners. Step by step instruction to advance functional mobility.      Exercises General Exercises - Lower Extremity Ankle Circles/Pumps: AROM;Both;20 reps;Supine Quad Sets: AROM;Right;10 reps;Supine Heel Slides: AAROM;Right;10 reps;Supine Hip ABduction/ADduction: AAROM;Right;10 reps;Supine    General Comments        Pertinent Vitals/Pain Pain Assessment: Faces Faces  Pain Scale: Hurts little more Pain Location: RLE and RUE with mobility Pain Descriptors / Indicators: Grimacing;Guarding Pain Intervention(s): Monitored during session;Repositioned    Home Living                      Prior Function            PT Goals (current goals can now be found in the care plan section) Acute Rehab  PT Goals Patient Stated Goal: to get stronger Potential to Achieve Goals: Good Progress towards PT goals: Progressing toward goals    Frequency    Min 3X/week      PT Plan Current plan remains appropriate    Co-evaluation              AM-PAC PT "6 Clicks" Mobility   Outcome Measure  Help needed turning from your back to your side while in a flat bed without using bedrails?: A Lot Help needed moving from lying on your back to sitting on the side of a flat bed without using bedrails?: A Lot Help needed moving to and from a bed to a chair (including a wheelchair)?: A Lot Help needed standing up from a chair using your arms (e.g., wheelchair or bedside chair)?: A Lot Help needed to walk in hospital room?: Total Help needed climbing 3-5 steps with a railing? : Total 6 Click Score: 10    End of Session Equipment Utilized During Treatment: Gait belt Activity Tolerance: Patient tolerated treatment well Patient left: in chair;with call bell/phone within reach;with chair alarm set;with family/visitor present Nurse Communication: Mobility status PT Visit Diagnosis: Unsteadiness on feet (R26.81);History of falling (Z91.81);Difficulty in walking, not elsewhere classified (R26.2);Pain Pain - Right/Left: Right Pain - part of body: Arm;Hip     Time: [id number] PT Time Calculation (min) (ACUTE ONLY): 26 min  Charges:  $Therapeutic Exercise: 8-22 mins $Therapeutic Activity: 8-22 mins                     Erasmo Leventhal , PTA Acute Rehabilitation Services Pager [phone] Office [phone]     Verbena Boeding [name] 04/21/2020, 12:24 PM

## 2020-04-21 NOTE — Plan of Care (Signed)
  Problem: Activity: Goal: Ability to ambulate and perform ADLs will improve 04/21/2020 2318 by Josepha Pigg, RN Outcome: Progressing 04/21/2020 2317 by Josepha Pigg, RN Outcome: Progressing 04/21/2020 2317 by Josepha Pigg, RN Outcome: Progressing   Problem: Self-Concept: Goal: Ability to maintain and perform role responsibilities to the fullest extent possible will improve 04/21/2020 2318 by Josepha Pigg, RN Outcome: Progressing 04/21/2020 2317 by Josepha Pigg, RN Outcome: Progressing 04/21/2020 2317 by Josepha Pigg, RN Outcome: Progressing   Problem: Pain Management: Goal: Pain level will decrease 04/21/2020 2318 by Josepha Pigg, RN Outcome: Progressing 04/21/2020 2317 by Josepha Pigg, RN Outcome: Progressing 04/21/2020 2317 by Josepha Pigg, RN Outcome: Progressing   Problem: Education: Goal: Knowledge of General Education information will improve Description: Including pain rating scale, medication(s)/side effects and non-pharmacologic comfort measures Outcome: Progressing   Problem: Health Behavior/Discharge Planning: Goal: Ability to manage health-related needs will improve Outcome: Progressing   Problem: Clinical Measurements: Goal: Ability to maintain clinical measurements within normal limits will improve Outcome: Progressing Goal: Will remain free from infection Outcome: Progressing Goal: Diagnostic test results will improve Outcome: Progressing Goal: Respiratory complications will improve Outcome: Progressing Goal: Cardiovascular complication will be avoided Outcome: Progressing   Problem: Activity: Goal: Risk for activity intolerance will decrease Outcome: Progressing   Problem: Nutrition: Goal: Adequate nutrition will be maintained Outcome: Progressing   Problem: Coping: Goal: Level of anxiety will decrease Outcome: Progressing   Problem: Elimination: Goal: Will not experience complications related to bowel  motility Outcome: Progressing Goal: Will not experience complications related to urinary retention Outcome: Progressing   Problem: Pain Managment: Goal: General experience of comfort will improve Outcome: Progressing   Problem: Safety: Goal: Ability to remain free from injury will improve Outcome: Progressing   Problem: Skin Integrity: Goal: Risk for impaired skin integrity will decrease Outcome: Progressing

## 2020-04-22 LAB — CBC WITH DIFFERENTIAL/PLATELET
Abs Immature Granulocytes: 0.04 10*3/uL (ref 0.00–0.07)
Basophils Absolute: 0 10*3/uL (ref 0.0–0.1)
Basophils Relative: 1 %
Eosinophils Absolute: 0.5 10*3/uL (ref 0.0–0.5)
Eosinophils Relative: 6 %
HCT: 22.8 % — ABNORMAL LOW (ref 36.0–46.0)
Hemoglobin: 7.4 g/dL — ABNORMAL LOW (ref 12.0–15.0)
Immature Granulocytes: 1 %
Lymphocytes Relative: 13 %
Lymphs Abs: 1.1 10*3/uL (ref 0.7–4.0)
MCH: 33.2 pg (ref 26.0–34.0)
MCHC: 32.5 g/dL (ref 30.0–36.0)
MCV: 102.2 fL — ABNORMAL HIGH (ref 80.0–100.0)
Monocytes Absolute: 1.3 10*3/uL — ABNORMAL HIGH (ref 0.1–1.0)
Monocytes Relative: 16 %
Neutro Abs: 5.4 10*3/uL (ref 1.7–7.7)
Neutrophils Relative %: 63 %
Platelets: 173 10*3/uL (ref 150–400)
RBC: 2.23 MIL/uL — ABNORMAL LOW (ref 3.87–5.11)
RDW: 13.4 % (ref 11.5–15.5)
WBC: 8.5 10*3/uL (ref 4.0–10.5)
nRBC: 0 % (ref 0.0–0.2)

## 2020-04-22 LAB — BASIC METABOLIC PANEL
Anion gap: 4 — ABNORMAL LOW (ref 5–15)
BUN: 13 mg/dL (ref 8–23)
CO2: 30 mmol/L (ref 22–32)
Calcium: 8.8 mg/dL — ABNORMAL LOW (ref 8.9–10.3)
Chloride: 102 mmol/L (ref 98–111)
Creatinine, Ser: 0.62 mg/dL (ref 0.44–1.00)
GFR calc Af Amer: >60 mL/min (ref >60)
GFR calc non Af Amer: >60 mL/min (ref >60)
Glucose, Bld: 102 mg/dL — ABNORMAL HIGH (ref 70–99)
Potassium: 3.6 mmol/L (ref 3.5–5.1)
Sodium: 136 mmol/L (ref 135–145)

## 2020-04-22 MED ORDER — BISACODYL 10 MG RE SUPP
10.0000 mg | Freq: Once | RECTAL | Status: AC
  Administered 2020-04-22: 10 mg via RECTAL
  Filled 2020-04-22: qty 1

## 2020-04-22 MED ORDER — FERROUS SULFATE 325 (65 FE) MG PO TABS
325.0000 mg | ORAL_TABLET | Freq: Every day | ORAL | Status: DC
  Administered 2020-04-23 – 2020-05-01 (×9): 325 mg via ORAL
  Filled 2020-04-22 (×9): qty 1

## 2020-04-22 MED ORDER — POLYETHYLENE GLYCOL 3350 17 G PO PACK
17.0000 g | PACK | Freq: Every day | ORAL | Status: DC
  Administered 2020-04-22 – 2020-04-25 (×2): 17 g via ORAL
  Filled 2020-04-22 (×8): qty 1

## 2020-04-22 MED ORDER — DOCUSATE SODIUM 100 MG PO CAPS
100.0000 mg | ORAL_CAPSULE | Freq: Two times a day (BID) | ORAL | Status: DC
  Administered 2020-04-22 – 2020-04-30 (×9): 100 mg via ORAL
  Filled 2020-04-22 (×18): qty 1

## 2020-04-22 MED ORDER — ASCORBIC ACID 500 MG PO TABS
500.0000 mg | ORAL_TABLET | Freq: Every day | ORAL | Status: DC
  Administered 2020-04-22 – 2020-05-01 (×10): 500 mg via ORAL
  Filled 2020-04-22 (×10): qty 1

## 2020-04-22 NOTE — Care Management (Addendum)
    Received a call from patient's daughter Santiago Glad . Family and patient do not want to go to [facility] . They know someone who works at [employer] and want a referral sent there.   Called [facility] admissions spoke with Caren Griffins , review over phone. Caren Griffins asked NCM to fax in referral to [phone]. Phone [phone]  Called daughter Santiago Glad back and left voicemail.   Received a call from [place] at [employer] , she has received fax and will review information this evening and visit patient at bedside tomorrow am for an evaluation.  Called Santiago Glad . Santiago Glad aware of all of above. Her sister will be visiting in the morning.  Magdalen Spatz RN

## 2020-04-22 NOTE — Plan of Care (Signed)
  Problem: Activity: Goal: Ability to ambulate and perform ADLs will improve Outcome: Progressing   Problem: Self-Concept: Goal: Ability to maintain and perform role responsibilities to the fullest extent possible will improve Outcome: Progressing   Problem: Pain Management: Goal: Pain level will decrease Outcome: Progressing   Problem: Education: Goal: Knowledge of General Education information will improve Description: Including pain rating scale, medication(s)/side effects and non-pharmacologic comfort measures Outcome: Progressing   Problem: Health Behavior/Discharge Planning: Goal: Ability to manage health-related needs will improve Outcome: Progressing   Problem: Clinical Measurements: Goal: Ability to maintain clinical measurements within normal limits will improve Outcome: Progressing Goal: Will remain free from infection Outcome: Progressing Goal: Diagnostic test results will improve Outcome: Progressing Goal: Respiratory complications will improve Outcome: Progressing Goal: Cardiovascular complication will be avoided Outcome: Progressing   Problem: Activity: Goal: Risk for activity intolerance will decrease Outcome: Progressing   Problem: Nutrition: Goal: Adequate nutrition will be maintained Outcome: Progressing   Problem: Coping: Goal: Level of anxiety will decrease Outcome: Progressing   Problem: Elimination: Goal: Will not experience complications related to bowel motility Outcome: Progressing Goal: Will not experience complications related to urinary retention Outcome: Progressing   Problem: Pain Managment: Goal: General experience of comfort will improve Outcome: Progressing   Problem: Safety: Goal: Ability to remain free from injury will improve Outcome: Progressing   Problem: Skin Integrity: Goal: Risk for impaired skin integrity will decrease Outcome: Progressing

## 2020-04-22 NOTE — Progress Notes (Signed)
PROGRESS NOTE    Savannah Coleman  [id number] DOB: [birth year] DOA: 04/17/2020 PCP: Jonathon Resides, MD    Brief Narrative:  86 y.o.femalewith medical history significant ofHTN, prior R femur fx in 2015. Pt presents to ED after she slipped and fell in shower. Pain to both wrists and R femur. Unable to bear wait, pain worse with movement. Couldn't bend or walk R leg. In the ED, noted a R wrist fx, and a much more complicated R perihardware lesser trochanter fracture of femur.  EDP consulted orthopedics.  Patient admitted for further management.  Assessment & Plan:   Principal Problem:   Intertrochanteric fracture of right hip [facility]) Active Problems:   Hypertension   Closed fracture of right distal radius   Right periprosthetic intertrochanteric hip fracture s/p surgery on 04/19/2020 S/p mechanical fall X-ray with avulsion fx of the lesser trochanter of the R femur at the level of the proximal threaded screw CT head/cervical spine showed no acute fracture or intracranial abnormality Orthopedics on board. Pt now s/p surgery on 5/3 Pain management, DVT PPx as per orthopedics PT/OT following, recs for SNF. SW following for SNF placement  Right wrist fracture s/p ORIF on 04/19/2020 X-ray showed mildly displaced transverse fracture of the right radial head Orthopedics on board, cont to follow recs PT/OT continues to follow  Leukocytosis Likely reactive secondary to above fractures Labs reviewed. WBC has since normalized  Macrocytic anemia/post op anemia Anemia panel unremarkable No evidence of acute bleed at this time Slight trend down to 7.4 Will start PO iron and vit C  Hypertension Continue home amlodipine BP stable and controlled currently  Hyperlipidemia Continue home statin as pt tolerates  GERD Continue PPI as pt tolerates  Constipation Pt reports last BM was on 5/1 Will give trial of miralax Cont with cathartics until results noted  DVT prophylaxis:  Lovenox subQ Code Status: Full Family Communication: Pt in room, family at bedside  Status is: Inpatient  Remains inpatient appropriate because:Unsafe d/c plan, awaiting SNF bed   Dispo: The patient is from: Home              Anticipated d/c is to: SNF              Anticipated d/c date is: 1 day              Patient currently is medically stable to d/c.   Consultants:   Orthopedic Surgery  Procedures:  1. RIGHT HIP ANTEGRADE INTRAMEDULLARY NAILING WITH BIOMET AFFIXUS 11 MM X 360 MM  2. REMOVAL OF BIOMET RETROGRADE NAIL (Right) 3. Open Reduction Internal Fixation (Orif) Wrist Fracture (Right) DISTAL RADIUS DVR PLATE  Antimicrobials: Anti-infectives (From admission, onward)   Start     Dose/Rate Route Frequency Ordered Stop   04/19/20 0731  ceFAZolin (ANCEF) 2-4 GM/100ML-% IVPB    Note to Pharmacy: Henrine Screws   : cabinet override      04/19/20 0731 04/19/20 0848   04/19/20 0730  ceFAZolin (ANCEF) IVPB 2g/100 mL premix     2 g 200 mL/hr over 30 Minutes Intravenous On call to O.R. 04/19/20 0728 04/19/20 0906      Subjective: Reports feeling constipated  Objective: Vitals:   04/21/20 1246 04/21/20 2008 04/22/20 0312 04/22/20 0824  BP: (!) 101/59 117/65 122/70 116/67  Pulse: 72 85 81 94  Resp: 17 18 17 16   Temp: 97.9 F (36.6 C) 98.7 F (37.1 C) 98.5 F (36.9 C) 98.3 F (36.8 C)  TempSrc: Oral Oral  Oral Oral  SpO2: 96% 99% 96% 93%  Weight:      Height:        Intake/Output Summary (Last 24 hours) at 04/22/2020 1454 Last data filed at 04/22/2020 1300 Gross per 24 hour  Intake 500 ml  Output 1000 ml  Net -500 ml   Filed Weights   04/17/20 1424 04/19/20 0654  Weight: 57.2 kg 57.2 kg    Examination: General exam: Awake, laying in bed, in nad Respiratory system: Normal respiratory effort, no wheezing Cardiovascular system: regular rate, s1, s2 Gastrointestinal system: Soft, nondistended, decreased BS Central nervous system: CN2-12 grossly intact, strength  intact Extremities: Perfused, no clubbing Skin: Normal skin turgor, no notable skin lesions seen Psychiatry: Mood normal // no visual hallucinations   Data Reviewed: I have personally reviewed following labs and imaging studies  CBC: Recent Labs  Lab 04/17/20 1600 04/17/20 1600 04/18/20 0349 04/19/20 0555 04/20/20 0652 04/21/20 0510 04/22/20 0308  WBC 11.9*   < > 9.9 9.1 11.9* 10.0 8.5  NEUTROABS 9.7*  --   --  7.1 9.3* 7.1 5.4  HGB 12.2   < > 10.9* 10.4* 8.2* 8.1* 7.4*  HCT 38.1   < > 32.7* 31.6* 24.9* 24.6* 22.8*  MCV 101.1*   < > 100.6* 101.3* 102.5* 102.1* 102.2*  PLT 219   < > 207 144* 139* 157 173   < > = values in this interval not displayed.   Basic Metabolic Panel: Recent Labs  Lab 04/18/20 0349 04/19/20 0555 04/20/20 0652 04/21/20 0510 04/22/20 0308  NA 139 135 135 136 136  K 4.1 3.4* 3.8 3.7 3.6  CL 103 99 101 100 102  CO2 25 28 27 29 30   GLUCOSE 126* 173* 101* 104* 102*  BUN 13 8 11 15 13   CREATININE 0.84 0.75 0.70 0.66 0.62  CALCIUM 9.4 9.3 9.2 9.2 8.8*   GFR: Estimated Creatinine Clearance: 39.9 mL/min (by C-G formula based on SCr of 0.62 mg/dL). Liver Function Tests: Recent Labs  Lab 04/17/20 1600  AST 43*  ALT 23  ALKPHOS 56  BILITOT 0.9  PROT 6.4*  ALBUMIN 3.9   No results for input(s): LIPASE, AMYLASE in the last 168 hours. No results for input(s): AMMONIA in the last 168 hours. Coagulation Profile: No results for input(s): INR, PROTIME in the last 168 hours. Cardiac Enzymes: No results for input(s): CKTOTAL, CKMB, CKMBINDEX, TROPONINI in the last 168 hours. BNP (last 3 results) No results for input(s): PROBNP in the last 8760 hours. HbA1C: No results for input(s): HGBA1C in the last 72 hours. CBG: No results for input(s): GLUCAP in the last 168 hours. Lipid Profile: No results for input(s): CHOL, HDL, LDLCALC, TRIG, CHOLHDL, LDLDIRECT in the last 72 hours. Thyroid Function Tests: No results for input(s): TSH, T4TOTAL, FREET4,  T3FREE, THYROIDAB in the last 72 hours. Anemia Panel: No results for input(s): VITAMINB12, FOLATE, FERRITIN, TIBC, IRON, RETICCTPCT in the last 72 hours. Sepsis Labs: No results for input(s): PROCALCITON, LATICACIDVEN in the last 168 hours.  Recent Results (from the past 240 hour(s))  Respiratory Panel by RT PCR (Flu A&B, Covid) - Nasopharyngeal Swab     Status: None   Collection Time: 04/17/20  4:48 PM   Specimen: Nasopharyngeal Swab  Result Value Ref Range Status   SARS Coronavirus 2 by RT PCR NEGATIVE NEGATIVE Final    Comment: (NOTE) SARS-CoV-2 target nucleic acids are NOT DETECTED. The SARS-CoV-2 RNA is generally detectable in upper respiratoy specimens during the acute phase of  infection. The lowest concentration of SARS-CoV-2 viral copies this assay can detect is 131 copies/mL. A negative result does not preclude SARS-Cov-2 infection and should not be used as the sole basis for treatment or other patient management decisions. A negative result may occur with  improper specimen collection/handling, submission of specimen other than nasopharyngeal swab, presence of viral mutation(s) within the areas targeted by this assay, and inadequate number of viral copies (<131 copies/mL). A negative result must be combined with clinical observations, patient history, and epidemiological information. The expected result is Negative. Fact Sheet for Patients:  PinkCheek.be Fact Sheet for Healthcare Providers:  GravelBags.it This test is not yet ap proved or cleared by the [place] FDA and  has been authorized for detection and/or diagnosis of SARS-CoV-2 by FDA under an Emergency Use Authorization (EUA). This EUA will remain  in effect (meaning this test can be used) for the duration of the COVID-19 declaration under Section 564(b)(1) of the Act, 21 U.S.C. section 360bbb-3(b)(1), unless the authorization is terminated or revoked  sooner.    Influenza A by PCR NEGATIVE NEGATIVE Final   Influenza B by PCR NEGATIVE NEGATIVE Final    Comment: (NOTE) The Xpert Xpress SARS-CoV-2/FLU/RSV assay is intended as an aid in  the diagnosis of influenza from Nasopharyngeal swab specimens and  should not be used as a sole basis for treatment. Nasal washings and  aspirates are unacceptable for Xpert Xpress SARS-CoV-2/FLU/RSV  testing. Fact Sheet for Patients: PinkCheek.be Fact Sheet for Healthcare Providers: GravelBags.it This test is not yet approved or cleared by the [place] FDA and  has been authorized for detection and/or diagnosis of SARS-CoV-2 by  FDA under an Emergency Use Authorization (EUA). This EUA will remain  in effect (meaning this test can be used) for the duration of the  Covid-19 declaration under Section 564(b)(1) of the Act, 21  U.S.C. section 360bbb-3(b)(1), unless the authorization is  terminated or revoked. Performed at [facility], [place] [address], [place], [place] [address]   Surgical PCR screen     Status: None   Collection Time: 04/17/20 10:31 PM   Specimen: Nasal Mucosa; Nasal Swab  Result Value Ref Range Status   MRSA, PCR NEGATIVE NEGATIVE Final   Staphylococcus aureus NEGATIVE NEGATIVE Final    Comment: (NOTE) The Xpert SA Assay (FDA approved for NASAL specimens in patients 1 years of age and older), is one component of a comprehensive surveillance program. It is not intended to diagnose infection nor to guide or monitor treatment. Performed at [facility], [place] [address] [place], [place] [address]      Radiology Studies: No results found.  Scheduled Meds: . acetaminophen  500 mg Oral Q12H  . amLODipine  5 mg Oral Daily  . vitamin C  500 mg Oral Daily  . bacitracin   Topical BID  . docusate sodium  100 mg Oral BID  . enoxaparin (LOVENOX) injection  40 mg Subcutaneous Q24H  . [START ON 04/23/2020] ferrous  sulfate  325 mg Oral Q breakfast  . multivitamin with minerals  1 tablet Oral Daily  . mupirocin ointment  1 application Nasal BID  . pantoprazole  40 mg Oral Daily  . polyethylene glycol  17 g Oral Daily  . simvastatin  20 mg Oral QHS   Continuous Infusions:   LOS: 5 days   Marylu Lund, MD [facility] Pager On Amion  If 7PM-7AM, please contact night-coverage 04/22/2020, 2:54 PM

## 2020-04-22 NOTE — Progress Notes (Signed)
Physical Therapy Treatment Patient Details Name: Savannah Coleman MRN: [id number] DOB: 1931/12/04 Today's Date: 04/22/2020    History of Present Illness Savannah Coleman is a 86 y.o. female with medical history significant of HTN, prior R femur fx in 2015. Pt presents to ED after she slipped and fell in shower. S/p right hip IM nail and right wrist ORIF.    PT Comments    Pt reports feeling bad today.  Daughter concerned about her Hgb level.  Informed RN and MD.  Pt continues to benefit from SNF placement due to need for external assistance.  Will continue PT per POC.      Follow Up Recommendations  SNF     Equipment Recommendations  Rolling walker with 5" wheels    Recommendations for Other Services       Precautions / Restrictions Precautions Precautions: Fall Precaution Comments: pt fell in shower  Restrictions Weight Bearing Restrictions: Yes RUE Weight Bearing: Weight bear through elbow only(ON PLATFORM RW OTHERWISE NWB) RLE Weight Bearing: Weight bearing as tolerated Other Position/Activity Restrictions: weight bearing through R elbow okay    Mobility  Bed Mobility Overal bed mobility: Needs Assistance Bed Mobility: Supine to Sit     Supine to sit: Mod assist     General bed mobility comments: Mod assistance to advance B LEs to edge of bed and elevate trunk into a seated position.  Pt required max assistance to scoot with bed pad to edge of bed.  Transfers Overall transfer level: Needs assistance Equipment used: Right platform walker Transfers: Sit to/from Stand Sit to Stand: Mod assist         General transfer comment: Cues for hand placement to and from seated surface.  Cues to place RUE on platform.  Ambulation/Gait Ambulation/Gait assistance: Max assist Gait Distance (Feet): 4 Feet Assistive device: Rolling walker (2 wheeled) Gait Pattern/deviations: Step-to pattern;Shuffle;Decreased stride length;Decreased step length - left;Decreased stance time -  right     General Gait Details: Pt required max assistance to weight shift to allow for forward progression of steps.  Pt very limited due to pain and fatigue.   Stairs             Wheelchair Mobility    Modified Rankin (Stroke Patients Only)       Balance Overall balance assessment: Needs assistance Sitting-balance support: No upper extremity supported;Feet supported Sitting balance-Leahy Scale: Fair     Standing balance support: Bilateral upper extremity supported;During functional activity Standing balance-Leahy Scale: Zero                              Cognition Arousal/Alertness: Awake/alert Behavior During Therapy: WFL for tasks assessed/performed Overall Cognitive Status: Impaired/Different from baseline Area of Impairment: Memory;Safety/judgement                     Memory: Decreased short-term memory   Safety/Judgement: Decreased awareness of deficits;Decreased awareness of safety     General Comments: Pt remains with a good sense of humor and requires redirection during her intemittent one liners. Step by step instruction to advance functional mobility.      Exercises General Exercises - Lower Extremity Ankle Circles/Pumps: AROM;Both;20 reps;Supine Quad Sets: AROM;Right;10 reps;Supine Heel Slides: AAROM;Right;10 reps;Supine Hip ABduction/ADduction: AAROM;Right;10 reps;Supine    General Comments        Pertinent Vitals/Pain Pain Assessment: Faces Faces Pain Scale: Hurts little more Pain Location: RLE and RUE with mobility Pain  Descriptors / Indicators: Grimacing;Guarding Pain Intervention(s): Monitored during session;Repositioned    Home Living                      Prior Function            PT Goals (current goals can now be found in the care plan section) Acute Rehab PT Goals Patient Stated Goal: to get stronger Potential to Achieve Goals: Good Progress towards PT goals: Progressing toward goals     Frequency    Min 3X/week      PT Plan Current plan remains appropriate    Co-evaluation              AM-PAC PT "6 Clicks" Mobility   Outcome Measure  Help needed turning from your back to your side while in a flat bed without using bedrails?: A Lot Help needed moving from lying on your back to sitting on the side of a flat bed without using bedrails?: A Lot Help needed moving to and from a bed to a chair (including a wheelchair)?: A Lot Help needed standing up from a chair using your arms (e.g., wheelchair or bedside chair)?: A Lot Help needed to walk in hospital room?: Total Help needed climbing 3-5 steps with a railing? : Total 6 Click Score: 10    End of Session Equipment Utilized During Treatment: Gait belt Activity Tolerance: Patient tolerated treatment well Patient left: in chair;with call bell/phone within reach;with chair alarm set;with family/visitor present Nurse Communication: Mobility status PT Visit Diagnosis: Unsteadiness on feet (R26.81);History of falling (Z91.81);Difficulty in walking, not elsewhere classified (R26.2);Pain Pain - Right/Left: Right Pain - part of body: Arm;Hip     Time: [id number] PT Time Calculation (min) (ACUTE ONLY): 43 min  Charges:  $Gait Training: 8-22 mins $Therapeutic Exercise: 8-22 mins $Therapeutic Activity: 8-22 mins                     Erasmo Leventhal , PTA Acute Rehabilitation Services Pager [phone] Office [phone]     Raudel Bazen [name] 04/22/2020, 6:05 PM

## 2020-04-22 NOTE — Progress Notes (Signed)
Nutrition Follow-up  DOCUMENTATION CODES:   Not applicable  INTERVENTION:   -D/c Ensure Enlive -Continue MVI with minerals daily -Magic cup TID with meals, each supplement provides 290 kcal and 9 grams of protein  NUTRITION DIAGNOSIS:   Increased nutrient needs related to post-op healing as evidenced by estimated needs.  Ongoing  GOAL:   Patient will meet greater than or equal to 90% of their needs  Progressing   MONITOR:   PO intake, Supplement acceptance, Diet advancement, Labs, Weight trends, Skin, I & O's  REASON FOR ASSESSMENT:   Consult Assessment of nutrition requirement/status  ASSESSMENT:   Savannah Coleman is a 86 y.o. female with medical history significant of HTN.  Prior R femur fx in 2015. Admitted s/p fall.  5/3- s/p PROCEDURE:   1. RIGHT HIP ANTEGRADE INTRAMEDULLARY NAILING WITH BIOMET AFFIXUS 11 MM X 360 MM  2. REMOVAL OF BIOMET RETROGRADE NAIL (Right) 3. Open Reduction Internal Fixation (Orif) Wrist Fracture (Right) DISTAL RADIUS DVR PLATE  Reviewed I/O's: -460 ml x24 hours and -2.4 L since admission  UOP: 1.3 L x 24 hours  Spoke with pt and daughter at bedside. Pt daughter expressed concern over poor oral intake- she estimates pt has been consuming only about 25% of meals since admission. Observed breakfast tray- pt consumed only about 1/3 of banana. Pt shares that she also consumed a half of a bagel and cream cheese from [place] [employer] provided by a family member earlier, however, daughter, RN, and this RD did not witness this. Pt also shares she has been consuming soup from [employer] in the evenings.   Pt does not like Ensure supplements, stating they make her nauseous. She is amenable to [employer].   Pt with no BM since surgery; pt on laxatives and stool softeners.   Plan to d/c to SNF once bed is available.  Labs reviewed.    NUTRITION - FOCUSED PHYSICAL EXAM:    Most Recent Value  Orbital Region  No depletion  Upper Arm Region  Mild depletion   Thoracic and Lumbar Region  No depletion  Buccal Region  No depletion  Temple Region  Mild depletion  Clavicle Bone Region  No depletion  Clavicle and Acromion Bone Region  No depletion  Scapular Bone Region  No depletion  Dorsal Hand  No depletion  Patellar Region  No depletion  Anterior Thigh Region  No depletion  Posterior Calf Region  No depletion  Edema (RD Assessment)  Mild  Hair  Reviewed  Eyes  Reviewed  Mouth  Reviewed  Skin  Reviewed  Nails  Reviewed       Diet Order:   Diet Order            Diet regular Room service appropriate? Yes; Fluid consistency: Thin  Diet effective now              EDUCATION NEEDS:   No education needs have been identified at this time  Skin:  Skin Assessment: Skin Integrity Issues: Skin Integrity Issues:: Incisions Incisions: closed rt arm Other: facial laceration above rt eye  Last BM:  04/17/20  Height:   Ht Readings from Last 1 Encounters:  04/19/20 5\' 2"  (1.575 m)    Weight:   Wt Readings from Last 1 Encounters:  04/19/20 57.2 kg    Ideal Body Weight:  50 kg  BMI:  Body mass index is 23.05 kg/m.  Estimated Nutritional Needs:   Kcal:  1450-1650  Protein:  70-85 grams  Fluid:  >  1.4 L    Loistine Chance, RD, LDN, [place] Registered Dietitian II Certified Diabetes Care and Education Specialist Please refer to [facility] for RD and/or RD on-call/weekend/after hours pager

## 2020-04-22 NOTE — Progress Notes (Signed)
Orthopaedic Trauma Service Progress Note  Patient ID: ALEETA STALLING MRN: [id number] DOB/AGE: May 18, 1931 86 y.o.  Subjective:  Doing well No specific complaints this am   Denies any dizziness or lightheadedness with therapies  No other concerns   ROS As above  Objective:   VITALS:   Vitals:   04/21/20 1246 04/21/20 2008 04/22/20 0312 04/22/20 0824  BP: (!) 101/59 117/65 122/70 116/67  Pulse: 72 85 81 94  Resp: 17 18 17 16   Temp: 97.9 F (36.6 C) 98.7 F (37.1 C) 98.5 F (36.9 C) 98.3 F (36.8 C)  TempSrc: Oral Oral Oral Oral  SpO2: 96% 99% 96% 93%  Weight:      Height:        Estimated body mass index is 23.05 kg/m as calculated from the following:   Height as of this encounter: 5\' 2"  (1.575 m).   Weight as of this encounter: 57.2 kg.   Intake/Output      05/05 0701 - 05/06 0700 05/06 0701 - 05/07 0700   P.O. 900    Total Intake(mL/kg) 900 (15.7)    Urine (mL/kg/hr) 1500 (1.1)    Total Output 1500    Net -600           LABS  Results for orders placed or performed during the hospital encounter of 04/17/20 (from the past 24 hour(s))  CBC with Differential/Platelet     Status: Abnormal   Collection Time: 04/22/20  3:08 AM  Result Value Ref Range   WBC 8.5 4.0 - 10.5 K/uL   RBC 2.23 (L) 3.87 - 5.11 MIL/uL   Hemoglobin 7.4 (L) 12.0 - 15.0 g/dL   HCT 22.8 (L) 36.0 - 46.0 %   MCV 102.2 (H) 80.0 - 100.0 fL   MCH 33.2 26.0 - 34.0 pg   MCHC 32.5 30.0 - 36.0 g/dL   RDW 13.4 11.5 - 15.5 %   Platelets 173 150 - 400 K/uL   nRBC 0.0 0.0 - 0.2 %   Neutrophils Relative % 63 %   Neutro Abs 5.4 1.7 - 7.7 K/uL   Lymphocytes Relative 13 %   Lymphs Abs 1.1 0.7 - 4.0 K/uL   Monocytes Relative 16 %   Monocytes Absolute 1.3 (H) 0.1 - 1.0 K/uL   Eosinophils Relative 6 %   Eosinophils Absolute 0.5 0.0 - 0.5 K/uL   Basophils Relative 1 %   Basophils Absolute 0.0 0.0 - 0.1 K/uL   Immature  Granulocytes 1 %   Abs Immature Granulocytes 0.04 0.00 - 0.07 K/uL  Basic metabolic panel     Status: Abnormal   Collection Time: 04/22/20  3:08 AM  Result Value Ref Range   Sodium 136 135 - 145 mmol/L   Potassium 3.6 3.5 - 5.1 mmol/L   Chloride 102 98 - 111 mmol/L   CO2 30 22 - 32 mmol/L   Glucose, Bld 102 (H) 70 - 99 mg/dL   BUN 13 8 - 23 mg/dL   Creatinine, Ser 0.62 0.44 - 1.00 mg/dL   Calcium 8.8 (L) 8.9 - 10.3 mg/dL   GFR calc non Af Amer >60 >60 mL/min   GFR calc Af Amer >60 >60 mL/min   Anion gap 4 (L) 5 - 15     PHYSICAL EXAM:   Gen: in bed, NAD  Lungs:  unlabored Cardiac: reg  Ext:              Right Upper Extremity                          Splint c/d/i                         Moderate ecchymosis and swelling to R hand                         No pain with passive stretch                          Motor and sensory functions intact                          Ext warm                           Right Lower Extremity                          Dressings c/d/i    Dressings removed    All incisions look fantastic     New dressings applied to the hip                          Ext warm                          Minimal swelling                          + DP pulse                         Compartments are soft                          No pain with passive stretching                         Distal motor and sensory functions intact     Assessment/Plan: 3 Days Post-Op   Principal Problem:   Intertrochanteric fracture of right hip (HCC) Active Problems:   Hypertension   Closed fracture of right distal radius   Anti-infectives (From admission, onward)   Start     Dose/Rate Route Frequency Ordered Stop   04/19/20 0731  ceFAZolin (ANCEF) 2-4 GM/100ML-% IVPB    Note to Pharmacy: Henrine Screws   : cabinet override      04/19/20 0731 04/19/20 0848   04/19/20 0730  ceFAZolin (ANCEF) IVPB 2g/100 mL premix     2 g 200 mL/hr over 30 Minutes Intravenous On call to O.R. 04/19/20  [id number] 04/19/20 0906    .  POD/HD#: [age] y/o RHD female s/p fall with R hip fracture and R distal radius fracture    - fall   - R periimplant R intertrochanteric hip fracture s/p ROH and IMN R hip              WBAT R leg             ROM as tolerated  R hip and knee             PT/OT             Ice and elevate             Increase activity slowly              Dressings changed today   Ok to change as needed   Can leave open to the air    Ok to shower and clean with soap and water    - R distal radius fracture s/p ORIF             NWB thru wrist              Ok to WB thru elbow (can use platform walker)             Digit, elbow, shoulder motion as tolerated             Ice and elevate             Splint x 2 weeks              Therapies      - Pain management:             Continue with current regimen    - ABL anemia/Hemodynamics             Monitor, remains asymptomatic   Transfuse for hgb <7 or if she becomes symptomatic    - Medical issues              Per primary    - DVT/PE prophylaxis:             Lovenox x 28 days    - ID:              periop abx   - Metabolic Bone Disease:             Vitamin d levels look good             Meets criteria for fracture liaison service consult             Recommend dexa in 4-8 weeks   - Activity:             Up with assistance             As above   - FEN/GI prophylaxis/Foley/Lines:             Reg diet   - Impediments to fracture healing:             Poor bone quality    - Dispo:             Continue with therapies             snf once bed available  Follow up with ortho in 10 days for suture removal and [place], PA-C [phone] (C) 04/22/2020, 10:37 AM  [facility] [place] [address] [phone] Domingo Sep (F)

## 2020-04-23 LAB — CBC WITH DIFFERENTIAL/PLATELET
Abs Immature Granulocytes: 0.05 10*3/uL (ref 0.00–0.07)
Basophils Absolute: 0.1 10*3/uL (ref 0.0–0.1)
Basophils Relative: 1 %
Eosinophils Absolute: 0.4 10*3/uL (ref 0.0–0.5)
Eosinophils Relative: 5 %
HCT: 23.6 % — ABNORMAL LOW (ref 36.0–46.0)
Hemoglobin: 7.6 g/dL — ABNORMAL LOW (ref 12.0–15.0)
Immature Granulocytes: 1 %
Lymphocytes Relative: 18 %
Lymphs Abs: 1.4 10*3/uL (ref 0.7–4.0)
MCH: 33 pg (ref 26.0–34.0)
MCHC: 32.2 g/dL (ref 30.0–36.0)
MCV: 102.6 fL — ABNORMAL HIGH (ref 80.0–100.0)
Monocytes Absolute: 1.3 10*3/uL — ABNORMAL HIGH (ref 0.1–1.0)
Monocytes Relative: 16 %
Neutro Abs: 4.9 10*3/uL (ref 1.7–7.7)
Neutrophils Relative %: 59 %
Platelets: 213 10*3/uL (ref 150–400)
RBC: 2.3 MIL/uL — ABNORMAL LOW (ref 3.87–5.11)
RDW: 13.5 % (ref 11.5–15.5)
WBC: 8.1 10*3/uL (ref 4.0–10.5)
nRBC: 0 % (ref 0.0–0.2)

## 2020-04-23 LAB — BASIC METABOLIC PANEL
Anion gap: 4 — ABNORMAL LOW (ref 5–15)
BUN: 10 mg/dL (ref 8–23)
CO2: 32 mmol/L (ref 22–32)
Calcium: 9 mg/dL (ref 8.9–10.3)
Chloride: 102 mmol/L (ref 98–111)
Creatinine, Ser: 0.58 mg/dL (ref 0.44–1.00)
GFR calc Af Amer: >60 mL/min (ref >60)
GFR calc non Af Amer: >60 mL/min (ref >60)
Glucose, Bld: 99 mg/dL (ref 70–99)
Potassium: 3.5 mmol/L (ref 3.5–5.1)
Sodium: 138 mmol/L (ref 135–145)

## 2020-04-23 MED ORDER — ENOXAPARIN SODIUM 40 MG/0.4ML ~~LOC~~ SOLN: 40.0000 mg | SUBCUTANEOUS | 0 refills | Status: DC

## 2020-04-23 MED ORDER — ASCORBIC ACID 500 MG PO TABS: 500.0000 mg | ORAL_TABLET | Freq: Every day | ORAL | 0 refills | Status: DC

## 2020-04-23 MED ORDER — DOCUSATE SODIUM 100 MG PO CAPS: 100.0000 mg | ORAL_CAPSULE | Freq: Two times a day (BID) | ORAL | 0 refills | Status: DC

## 2020-04-23 MED ORDER — HYDROCODONE-ACETAMINOPHEN 5-325 MG PO TABS: 1.0000 | ORAL_TABLET | Freq: Four times a day (QID) | ORAL | 0 refills | Status: DC | PRN

## 2020-04-23 MED ORDER — FERROUS SULFATE 325 (65 FE) MG PO TABS: 325.0000 mg | ORAL_TABLET | Freq: Every day | ORAL | 1 refills | Status: DC

## 2020-04-23 MED ORDER — ACETAMINOPHEN 500 MG PO TABS: 500.0000 mg | ORAL_TABLET | Freq: Two times a day (BID) | ORAL | 0 refills | Status: DC

## 2020-04-23 NOTE — Care Management (Addendum)
    Laneta Simmers from [facility] , saw patient today at bedside. She is asking once PT /ot sees patient NCM fax updated notes to her at [phone], and she will submit for insurance authorization.  Called Rosario Adie at [facility] and left another message.    1201 PT note from today faxed to above number.  [place] at [facility] . Insurance auth for SNF needs to be withdrawn for Inpatient Rehab    Faxed OT note   Magdalen Spatz RN

## 2020-04-23 NOTE — Progress Notes (Signed)
Occupational Therapy Treatment Patient Details Name: MARIAJOSE NEITZ MRN: [id number] DOB: 06-18-1931 Today's Date: 04/23/2020    History of present illness DARRAGH DISHAW is a 86 y.o. female with medical history significant of HTN, prior R femur fx in 2015. Pt presents to ED after she slipped and fell in shower. S/p right hip IM nail and right wrist ORIF.   OT comments  Patient continues to make steady progress towards goals in skilled OT session. Patient's session encompassed ADLs at EOB and sit<>stand transfers to head of bed in order to progress overall mobility. Pt requires increased encouragement to maintain attention to task, however is motivated to progress towards independence. Pt continues to require cues for safety to adhere to precautions, however continues to progress with functional mobility with platform walker. Pt would be an excellent candidate for an inpatient rehab facility as pt has exceptional family support, and was completely independent prior to admission; will continue to follow acutely.    Follow Up Recommendations  Other (comment)(Inpatient Rehab Facility ([facility]))    Equipment Recommendations  3 in 1 bedside commode    Recommendations for Other Services      Precautions / Restrictions Precautions Precautions: Fall Precaution Comments: pt fell in shower  Restrictions Weight Bearing Restrictions: Yes RUE Weight Bearing: Weight bear through elbow only RLE Weight Bearing: Weight bearing as tolerated Other Position/Activity Restrictions: weight bearing through R elbow okay       Mobility Bed Mobility Overal bed mobility: Needs Assistance Bed Mobility: Sidelying to Sit Rolling: Min assist Sidelying to sit: Mod assist       General bed mobility comments: Assistance to advance LEs to edge of bed. Pt performed from right side of bed and able to rise into sitting with min assistance.  Increased time with use of bed pad to scoot to edge of  bed.  Transfers Overall transfer level: Needs assistance Equipment used: Right platform walker Transfers: Sit to/from Stand Sit to Stand: Min assist         General transfer comment: Cues for hand placement to push from seated surface with L hand.    Balance Overall balance assessment: Needs assistance Sitting-balance support: No upper extremity supported;Feet supported Sitting balance-Leahy Scale: Fair     Standing balance support: Bilateral upper extremity supported;During functional activity Standing balance-Leahy Scale: Poor Standing balance comment: mod +1 for dynamic gt with heavy reliance on B UEs.                           ADL either performed or assessed with clinical judgement   ADL Overall ADL's : Needs assistance/impaired     Grooming: Wash/dry hands;Wash/dry face;Set up;Sitting Grooming Details (indicate cue type and reason): Sitting EOB Upper Body Bathing: Minimal assistance;Sitting;Set up Upper Body Bathing Details (indicate cue type and reason): Sitting EOB     Upper Body Dressing : Minimal assistance;Sitting Upper Body Dressing Details (indicate cue type and reason): Donning gown     Toilet Transfer: Moderate assistance;Stand-pivot Toilet Transfer Details (indicate cue type and reason): Simluated with side steps towards head of bed         Functional mobility during ADLs: Moderate assistance;Cueing for safety;Cueing for sequencing;Rolling walker General ADL Comments: Pt completed ADLs EOB and then side steps toward [facility] [place] with RW, pt with cues for safety, however tolerated therapy well     Vision       Perception     Praxis  Cognition Arousal/Alertness: Awake/alert Behavior During Therapy: WFL for tasks assessed/performed Overall Cognitive Status: Within Functional Limits for tasks assessed                                 General Comments: Pt easily distracted and required cues to progress mobility and focus on  task at hand.        Exercises General Exercises - Lower Extremity Ankle Circles/Pumps: AROM;Both;20 reps;Supine Quad Sets: AROM;Right;10 reps;Supine Heel Slides: AAROM;Right;10 reps;Supine Hip ABduction/ADduction: AAROM;Right;10 reps;Supine   Shoulder Instructions       General Comments      Pertinent Vitals/ Pain       Pain Assessment: Faces Pain Score: 4  Faces Pain Scale: Hurts little more Pain Location: L wrist and RLE Pain Descriptors / Indicators: Grimacing;Guarding Pain Intervention(s): Limited activity within patient's tolerance;Monitored during session;Repositioned  Home Living                                          Prior Functioning/Environment              Frequency  Min 2X/week        Progress Toward Goals  OT Goals(current goals can now be found in the care plan section)  Progress towards OT goals: Progressing toward goals  Acute Rehab OT Goals Patient Stated Goal: to get stronger OT Goal Formulation: With patient/family Time For Goal Achievement: 05/04/20 Potential to Achieve Goals: Good  Plan Discharge plan needs to be updated    Co-evaluation                 AM-PAC OT "6 Clicks" Daily Activity     Outcome Measure   Help from another person eating meals?: A Little Help from another person taking care of personal grooming?: A Little Help from another person toileting, which includes using toliet, bedpan, or urinal?: A Lot Help from another person bathing (including washing, rinsing, drying)?: A Lot Help from another person to put on and taking off regular upper body clothing?: A Little Help from another person to put on and taking off regular lower body clothing?: A Lot 6 Click Score: 15    End of Session Equipment Utilized During Treatment: Gait belt;Rolling walker  OT Visit Diagnosis: Unsteadiness on feet (R26.81);Other abnormalities of gait and mobility (R26.89);Muscle weakness (generalized)  (M62.81);Pain Pain - Right/Left: Right Pain - part of body: Leg;Hand   Activity Tolerance Patient tolerated treatment well   Patient Left in chair;with chair alarm set;with call bell/phone within reach;with family/visitor present   Nurse Communication Mobility status([place])        Time: [id number] OT Time Calculation (min): 37 min  Charges: OT General Charges $OT Visit: 1 Visit OT Treatments $Self Care/Home Management : 23-37 mins   [place]. [place], [place] Acute Rehabilitation [facility] [place] 04/23/2020, 2:04 PM

## 2020-04-23 NOTE — Progress Notes (Signed)
Physical Therapy Treatment Patient Details Name: Savannah Coleman MRN: [id number] DOB: December 30, 1930 Today's Date: 04/23/2020    History of Present Illness Savannah Coleman is a 86 y.o. female with medical history significant of HTN, prior R femur fx in 2015. Pt presents to ED after she slipped and fell in shower. S/p right hip IM nail and right wrist ORIF.    PT Comments    Pt supine in bed on arrival.  Pt eager to mobilize this session.  Pt with decreased pain in RLE but more concerned with her L wrist pain.  SPO2 dropped to 88% on RA with activity but with seated break recovers to 92% RA.  Pt is progressing well and able to advance to 12 ft of gt this session.  She is requiring moderate assistance overall.  Based on her functional gains and improved activity tolerance will update recommedations for rehab in a post acute setting.  She will be an excellent candidate for aggressive rehab at IRF to improve strength and function before returning home to private residence. Will inform supervising PT of need for change in recommendations at this time.     Follow Up Recommendations  Other (comment)(IRF)     Equipment Recommendations  Rolling walker with 5" wheels    Recommendations for Other Services       Precautions / Restrictions Precautions Precautions: Fall Precaution Comments: pt fell in shower  Restrictions Weight Bearing Restrictions: Yes RUE Weight Bearing: Weight bear through elbow only(other wise NWB.) RLE Weight Bearing: Weight bearing as tolerated Other Position/Activity Restrictions: weight bearing through R elbow okay    Mobility  Bed Mobility Overal bed mobility: Needs Assistance Bed Mobility: Rolling;Sidelying to Sit Rolling: Min assist Sidelying to sit: Mod assist       General bed mobility comments: Assistance to advance LEs to edge of bed. Pt performed from right side of bed and able to rise into sitting with min assistance.  Increased time with use of bed pad to  scoot to edge of bed.  Transfers Overall transfer level: Needs assistance Equipment used: Right platform walker Transfers: Sit to/from Stand Sit to Stand: Min assist         General transfer comment: Cues for hand placement to push from seated surface with L hand.  Ambulation/Gait Ambulation/Gait assistance: Mod assist;+2 safety/equipment Gait Distance (Feet): 12 Feet Assistive device: Rolling walker (2 wheeled) Gait Pattern/deviations: Step-to pattern;Shuffle;Decreased stride length;Decreased step length - left;Decreased stance time - right     General Gait Details: Intially with difficulty weight shifting required assistance to shift core to improve foot clearance on the L.  Pt fatigues quickly and limited due to pain in L wrist.   Stairs             Wheelchair Mobility    Modified Rankin (Stroke Patients Only)       Balance     Sitting balance-Leahy Scale: Fair       Standing balance-Leahy Scale: Poor Standing balance comment: mod +1 for dynamic gt with heavy reliance on B UEs.                            Cognition Arousal/Alertness: Awake/alert Behavior During Therapy: WFL for tasks assessed/performed Overall Cognitive Status: Within Functional Limits for tasks assessed  General Comments: Pt easily distracted and required cues to progress mobility and focus on task at hand.      Exercises General Exercises - Lower Extremity Ankle Circles/Pumps: AROM;Both;20 reps;Supine Quad Sets: AROM;Right;10 reps;Supine Heel Slides: AAROM;Right;10 reps;Supine Hip ABduction/ADduction: AAROM;Right;10 reps;Supine    General Comments        Pertinent Vitals/Pain Pain Assessment: 0-10 Pain Score: 4  Pain Location: L wrist and RLE Pain Descriptors / Indicators: Grimacing;Guarding Pain Intervention(s): Monitored during session;Repositioned    Home Living                      Prior Function             PT Goals (current goals can now be found in the care plan section) Acute Rehab PT Goals Patient Stated Goal: to get stronger Potential to Achieve Goals: Good Progress towards PT goals: Progressing toward goals    Frequency    Min 4X/week      PT Plan Discharge plan needs to be updated    Co-evaluation              AM-PAC PT "6 Clicks" Mobility   Outcome Measure  Help needed turning from your back to your side while in a flat bed without using bedrails?: A Lot Help needed moving from lying on your back to sitting on the side of a flat bed without using bedrails?: A Lot Help needed moving to and from a bed to a chair (including a wheelchair)?: A Lot Help needed standing up from a chair using your arms (e.g., wheelchair or bedside chair)?: A Little Help needed to walk in hospital room?: A Lot Help needed climbing 3-5 steps with a railing? : Total 6 Click Score: 12    End of Session Equipment Utilized During Treatment: Gait belt Activity Tolerance: Patient tolerated treatment well Patient left: in chair;with call bell/phone within reach;with chair alarm set;with family/visitor present Nurse Communication: Mobility status PT Visit Diagnosis: Unsteadiness on feet (R26.81);History of falling (Z91.81);Difficulty in walking, not elsewhere classified (R26.2);Pain Pain - Right/Left: Right Pain - part of body: Arm;Hip     Time: [id number] PT Time Calculation (min) (ACUTE ONLY): 23 min  Charges:  $Gait Training: 8-22 mins $Therapeutic Exercise: 8-22 mins                     Erasmo Leventhal , PTA Acute Rehabilitation Services Pager [phone] Office [place] 04/23/2020, 11:49 AM

## 2020-04-23 NOTE — Progress Notes (Signed)
PROGRESS NOTE    Savannah Coleman  [id number] DOB: [birth year] DOA: 04/17/2020 PCP: Jonathon Resides, MD    Brief Narrative:  86 y.o.femalewith medical history significant ofHTN, prior R femur fx in 2015. Pt presents to ED after she slipped and fell in shower. Pain to both wrists and R femur. Unable to bear wait, pain worse with movement. Couldn't bend or walk R leg. In the ED, noted a R wrist fx, and a much more complicated R perihardware lesser trochanter fracture of femur.  EDP consulted orthopedics.  Patient admitted for further management.  Assessment & Plan:   Principal Problem:   Intertrochanteric fracture of right hip [facility]) Active Problems:   Hypertension   Closed fracture of right distal radius   Right periprosthetic intertrochanteric hip fracture s/p surgery on 04/19/2020 S/p mechanical fall X-ray with avulsion fx of the lesser trochanter of the R femur at the level of the proximal threaded screw CT head/cervical spine showed no acute fracture or intracranial abnormality Orthopedics on board. Pt now s/p surgery on 5/3 Pain management, DVT PPx as per orthopedics PT/OT following, recs for inpt rehab facility noted, SW following  Right wrist fracture s/p ORIF on 04/19/2020 X-ray showed mildly displaced transverse fracture of the right radial head Orthopedics on board, cont to follow recs PT/OT continues to follow  Leukocytosis Likely reactive secondary to above fractures Labs reviewed. WBC had normalized  Macrocytic anemia/post op anemia Anemia panel unremarkable No evidence of acute bleed at this time Slight trended down to 7.4 Now on PO iron and vit C Hgb today 7.6. Plan to transfuse if hgb<7.0  Hypertension Continue home amlodipine BP stable and controlled at this time  Hyperlipidemia Continue home statin as pt tolerates  GERD Continue PPI as pt tolerates  Constipation Results noted with cathartics overnight Cont with cathartics as  needed  DVT prophylaxis: Lovenox subQ Code Status: Full Family Communication: Pt in room, family at bedside  Status is: Inpatient  Remains inpatient appropriate because:Unsafe d/c plan, awaiting SNF bed   Dispo: The patient is from: Home              Anticipated d/c is to: inpatient rehab facility              Anticipated d/c date is: 3 days              Patient currently is medically stable to d/c.   Consultants:   Orthopedic Surgery  Procedures:  1. RIGHT HIP ANTEGRADE INTRAMEDULLARY NAILING WITH BIOMET AFFIXUS 11 MM X 360 MM  2. REMOVAL OF BIOMET RETROGRADE NAIL (Right) 3. Open Reduction Internal Fixation (Orif) Wrist Fracture (Right) DISTAL RADIUS DVR PLATE  Antimicrobials: Anti-infectives (From admission, onward)   Start     Dose/Rate Route Frequency Ordered Stop   04/19/20 0731  ceFAZolin (ANCEF) 2-4 GM/100ML-% IVPB    Note to Pharmacy: Henrine Screws   : cabinet override      04/19/20 0731 04/19/20 0848   04/19/20 0730  ceFAZolin (ANCEF) IVPB 2g/100 mL premix     2 g 200 mL/hr over 30 Minutes Intravenous On call to O.R. 04/19/20 0728 04/19/20 0906      Subjective: Moved bowels overnight  Objective: Vitals:   04/22/20 2237 04/23/20 0456 04/23/20 0755 04/23/20 1450  BP: 112/70 121/66 136/68 (!) 113/55  Pulse: 81 76 83 84  Resp: 18 16 18 17   Temp: 97.9 F (36.6 C) 98.3 F (36.8 C) 98.4 F (36.9 C) 98.2 F (36.8 C)  TempSrc: Oral Oral Oral Oral  SpO2: 99% 97% 99% 93%  Weight:      Height:        Intake/Output Summary (Last 24 hours) at 04/23/2020 1558 Last data filed at 04/23/2020 1300 Gross per 24 hour  Intake 600 ml  Output 400 ml  Net 200 ml   Filed Weights   04/17/20 1424 04/19/20 0654  Weight: 57.2 kg 57.2 kg    Examination: General exam: Conversant, in no acute distress Respiratory system: normal chest rise, clear, no audible wheezing Cardiovascular system: regular rhythm, s1-s2 Gastrointestinal system: Nondistended, nontender, pos  BS Central nervous system: No seizures, no tremors Extremities: No cyanosis, no joint deformities Skin: No rashes, no pallor Psychiatry: Affect normal // no auditory hallucinations   Data Reviewed: I have personally reviewed following labs and imaging studies  CBC: Recent Labs  Lab 04/19/20 0555 04/20/20 0652 04/21/20 0510 04/22/20 0308 04/23/20 0427  WBC 9.1 11.9* 10.0 8.5 8.1  NEUTROABS 7.1 9.3* 7.1 5.4 4.9  HGB 10.4* 8.2* 8.1* 7.4* 7.6*  HCT 31.6* 24.9* 24.6* 22.8* 23.6*  MCV 101.3* 102.5* 102.1* 102.2* 102.6*  PLT 144* 139* 157 173 [id number]   Basic Metabolic Panel: Recent Labs  Lab 04/19/20 0555 04/20/20 0652 04/21/20 0510 04/22/20 0308 04/23/20 0427  NA 135 135 136 136 138  K 3.4* 3.8 3.7 3.6 3.5  CL 99 101 100 102 102  CO2 28 27 29 30  32  GLUCOSE 173* 101* 104* 102* 99  BUN 8 11 15 13 10   CREATININE 0.75 0.70 0.66 0.62 0.58  CALCIUM 9.3 9.2 9.2 8.8* 9.0   GFR: Estimated Creatinine Clearance: 39.9 mL/min (by C-G formula based on SCr of 0.58 mg/dL). Liver Function Tests: Recent Labs  Lab 04/17/20 1600  AST 43*  ALT 23  ALKPHOS 56  BILITOT 0.9  PROT 6.4*  ALBUMIN 3.9   No results for input(s): LIPASE, AMYLASE in the last 168 hours. No results for input(s): AMMONIA in the last 168 hours. Coagulation Profile: No results for input(s): INR, PROTIME in the last 168 hours. Cardiac Enzymes: No results for input(s): CKTOTAL, CKMB, CKMBINDEX, TROPONINI in the last 168 hours. BNP (last 3 results) No results for input(s): PROBNP in the last 8760 hours. HbA1C: No results for input(s): HGBA1C in the last 72 hours. CBG: No results for input(s): GLUCAP in the last 168 hours. Lipid Profile: No results for input(s): CHOL, HDL, LDLCALC, TRIG, CHOLHDL, LDLDIRECT in the last 72 hours. Thyroid Function Tests: No results for input(s): TSH, T4TOTAL, FREET4, T3FREE, THYROIDAB in the last 72 hours. Anemia Panel: No results for input(s): VITAMINB12, FOLATE, FERRITIN, TIBC,  IRON, RETICCTPCT in the last 72 hours. Sepsis Labs: No results for input(s): PROCALCITON, LATICACIDVEN in the last 168 hours.  Recent Results (from the past 240 hour(s))  Respiratory Panel by RT PCR (Flu A&B, Covid) - Nasopharyngeal Swab     Status: None   Collection Time: 04/17/20  4:48 PM   Specimen: Nasopharyngeal Swab  Result Value Ref Range Status   SARS Coronavirus 2 by RT PCR NEGATIVE NEGATIVE Final    Comment: (NOTE) SARS-CoV-2 target nucleic acids are NOT DETECTED. The SARS-CoV-2 RNA is generally detectable in upper respiratoy specimens during the acute phase of infection. The lowest concentration of SARS-CoV-2 viral copies this assay can detect is 131 copies/mL. A negative result does not preclude SARS-Cov-2 infection and should not be used as the sole basis for treatment or other patient management decisions. A negative result may occur with  improper specimen collection/handling, submission of specimen other than nasopharyngeal swab, presence of viral mutation(s) within the areas targeted by this assay, and inadequate number of viral copies (<131 copies/mL). A negative result must be combined with clinical observations, patient history, and epidemiological information. The expected result is Negative. Fact Sheet for Patients:  PinkCheek.be Fact Sheet for Healthcare Providers:  GravelBags.it This test is not yet ap proved or cleared by the [place] FDA and  has been authorized for detection and/or diagnosis of SARS-CoV-2 by FDA under an Emergency Use Authorization (EUA). This EUA will remain  in effect (meaning this test can be used) for the duration of the COVID-19 declaration under Section 564(b)(1) of the Act, 21 U.S.C. section 360bbb-3(b)(1), unless the authorization is terminated or revoked sooner.    Influenza A by PCR NEGATIVE NEGATIVE Final   Influenza B by PCR NEGATIVE NEGATIVE Final    Comment:  (NOTE) The Xpert Xpress SARS-CoV-2/FLU/RSV assay is intended as an aid in  the diagnosis of influenza from Nasopharyngeal swab specimens and  should not be used as a sole basis for treatment. Nasal washings and  aspirates are unacceptable for Xpert Xpress SARS-CoV-2/FLU/RSV  testing. Fact Sheet for Patients: PinkCheek.be Fact Sheet for Healthcare Providers: GravelBags.it This test is not yet approved or cleared by the [place] FDA and  has been authorized for detection and/or diagnosis of SARS-CoV-2 by  FDA under an Emergency Use Authorization (EUA). This EUA will remain  in effect (meaning this test can be used) for the duration of the  Covid-19 declaration under Section 564(b)(1) of the Act, 21  U.S.C. section 360bbb-3(b)(1), unless the authorization is  terminated or revoked. Performed at [facility], [place] [address] [place], [place] [address]   Surgical PCR screen     Status: None   Collection Time: 04/17/20 10:31 PM   Specimen: Nasal Mucosa; Nasal Swab  Result Value Ref Range Status   MRSA, PCR NEGATIVE NEGATIVE Final   Staphylococcus aureus NEGATIVE NEGATIVE Final    Comment: (NOTE) The Xpert SA Assay (FDA approved for NASAL specimens in patients 47 years of age and older), is one component of a comprehensive surveillance program. It is not intended to diagnose infection nor to guide or monitor treatment. Performed at [facility], [place] [address] [place], [place] [address]      Radiology Studies: No results found.  Scheduled Meds: . acetaminophen  500 mg Oral Q12H  . amLODipine  5 mg Oral Daily  . vitamin C  500 mg Oral Daily  . bacitracin   Topical BID  . docusate sodium  100 mg Oral BID  . enoxaparin (LOVENOX) injection  40 mg Subcutaneous Q24H  . ferrous sulfate  325 mg Oral Q breakfast  . multivitamin with minerals  1 tablet Oral Daily  . pantoprazole  40 mg Oral Daily  .  polyethylene glycol  17 g Oral Daily  . simvastatin  20 mg Oral QHS   Continuous Infusions:   LOS: 6 days   Marylu Lund, MD [facility] Pager On Amion  If 7PM-7AM, please contact night-coverage 04/23/2020, 3:58 PM

## 2020-04-23 NOTE — Plan of Care (Signed)
  Problem: Activity: Goal: Ability to ambulate and perform ADLs will improve Outcome: Progressing   Problem: Self-Concept: Goal: Ability to maintain and perform role responsibilities to the fullest extent possible will improve Outcome: Progressing   Problem: Pain Management: Goal: Pain level will decrease Outcome: Progressing   Problem: Education: Goal: Knowledge of General Education information will improve Description: Including pain rating scale, medication(s)/side effects and non-pharmacologic comfort measures Outcome: Progressing   Problem: Health Behavior/Discharge Planning: Goal: Ability to manage health-related needs will improve Outcome: Progressing   Problem: Clinical Measurements: Goal: Ability to maintain clinical measurements within normal limits will improve Outcome: Progressing Goal: Will remain free from infection Outcome: Progressing Goal: Diagnostic test results will improve Outcome: Progressing Goal: Respiratory complications will improve Outcome: Progressing Goal: Cardiovascular complication will be avoided Outcome: Progressing   Problem: Activity: Goal: Risk for activity intolerance will decrease Outcome: Progressing   Problem: Nutrition: Goal: Adequate nutrition will be maintained Outcome: Progressing   Problem: Coping: Goal: Level of anxiety will decrease Outcome: Progressing   Problem: Elimination: Goal: Will not experience complications related to bowel motility Outcome: Progressing Goal: Will not experience complications related to urinary retention Outcome: Progressing   Problem: Pain Managment: Goal: General experience of comfort will improve Outcome: Progressing   Problem: Safety: Goal: Ability to remain free from injury will improve Outcome: Progressing   Problem: Skin Integrity: Goal: Risk for impaired skin integrity will decrease Outcome: Progressing

## 2020-04-23 NOTE — Discharge Instructions (Addendum)
Orthopaedic Trauma Service Discharge Instructions   General Discharge Instructions  Orthopaedic Injuries:  Right distal radius fracture treated with open reduction and internal fixation using plate and screws             Right hip fracture treated with intramedullary nail              Left distal radius fracture treated non-operatively with casting   WEIGHT BEARING STATUS: nonweightbearing through right wrist, ok to weightbear through right elbow.  Weightbearing as tolerated right leg   RANGE OF MOTION/ACTIVITY: unrestricted range of motion R fingers, elbow, shoulder.  Unrestricted motion R hip, knee and ankle   Bone health: continue to take vitamin d. Recommend bone density scan in 4-8 weeks.    Wound Care: keep splint and dressing on R arm clean and dry. To not remove.  Ok to remove dressings to Right leg as needed. Once wounds are dry you may leave open to the air    Discharge Wound Care Instructions  Do NOT apply any ointments, solutions or lotions to pin sites or surgical wounds.  These prevent needed drainage and even though solutions like hydrogen peroxide kill bacteria, they also damage cells lining the pin sites that help fight infection.  Applying lotions or ointments can keep the wounds moist and can cause them to breakdown and open up as well. This can increase the risk for infection. When in doubt call the office.  Surgical incisions should be dressed daily if drainage persists   If any drainage is noted, use one layer of adaptic, then gauze, Kerlix, and an ace wrap. Alternatively you can use 4x4 gauze and tape  Once the incision is completely dry and without drainage, it may be left open to air out.  Showering may begin 36-48 hours later.  Cleaning gently with soap and water.   DVT/PE prophylaxis: lovenox injection daily x 28 days   Diet: as you were eating previously.  Can use over the counter stool softeners and bowel preparations, such as Miralax, to help with  bowel movements.  Narcotics can be constipating.  Be sure to drink plenty of fluids  PAIN MEDICATION USE AND EXPECTATIONS  You have likely been given narcotic medications to help control your pain.  After a traumatic event that results in an fracture (broken bone) with or without surgery, it is ok to use narcotic pain medications to help control one's pain.  We understand that everyone responds to pain differently and each individual patient will be evaluated on a regular basis for the continued need for narcotic medications. Ideally, narcotic medication use should last no more than 6-8 weeks (coinciding with fracture healing).   As a patient it is your responsibility as well to monitor narcotic medication use and report the amount and frequency you use these medications when you come to your office visit.   We would also advise that if you are using narcotic medications, you should take a dose prior to therapy to maximize you participation.  IF YOU ARE ON NARCOTIC MEDICATIONS IT IS NOT PERMISSIBLE TO OPERATE A MOTOR VEHICLE (MOTORCYCLE/CAR/TRUCK/MOPED) OR HEAVY MACHINERY DO NOT MIX NARCOTICS WITH OTHER CNS (CENTRAL NERVOUS SYSTEM) DEPRESSANTS SUCH AS ALCOHOL   STOP SMOKING OR USING NICOTINE PRODUCTS!!!!  As discussed nicotine severely impairs your body's ability to heal surgical and traumatic wounds but also impairs bone healing.  Wounds and bone heal by forming microscopic blood vessels (angiogenesis) and nicotine is a vasoconstrictor (essentially, shrinks blood vessels).  Therefore, if vasoconstriction occurs to these microscopic blood vessels they essentially disappear and are unable to deliver necessary nutrients to the healing tissue.  This is one modifiable factor that you can do to dramatically increase your chances of healing your injury.    (This means no smoking, no nicotine gum, patches, etc)  DO NOT USE NONSTEROIDAL ANTI-INFLAMMATORY DRUGS (NSAID'S)  Using products such as Advil  (ibuprofen), Aleve (naproxen), Motrin (ibuprofen) for additional pain control during fracture healing can delay and/or prevent the healing response.  If you would like to take over the counter (OTC) medication, Tylenol (acetaminophen) is ok.  However, some narcotic medications that are given for pain control contain acetaminophen as well. Therefore, you should not exceed more than 4000 mg of tylenol in a day if you do not have liver disease.  Also note that there are may OTC medicines, such as cold medicines and allergy medicines that my contain tylenol as well.  If you have any questions about medications and/or interactions please ask your doctor/PA or your pharmacist.      ICE AND ELEVATE INJURED/OPERATIVE EXTREMITY  Using ice and elevating the injured extremity above your heart can help with swelling and pain control.  Icing in a pulsatile fashion, such as 20 minutes on and 20 minutes off, can be followed.    Do not place ice directly on skin. Make sure there is a barrier between to skin and the ice pack.    Using frozen items such as frozen peas works well as the conform nicely to the are that needs to be iced.  USE AN ACE WRAP OR TED HOSE FOR SWELLING CONTROL  In addition to icing and elevation, Ace wraps or TED hose are used to help limit and resolve swelling.  It is recommended to use Ace wraps or TED hose until you are informed to stop.    When using Ace Wraps start the wrapping distally (farthest away from the body) and wrap proximally (closer to the body)   Example: If you had surgery on your leg or thing and you do not have a splint on, start the ace wrap at the toes and work your way up to the thigh        If you had surgery on your upper extremity and do not have a splint on, start the ace wrap at your fingers and work your way up to the upper arm  IF YOU ARE IN A SPLINT OR CAST DO NOT [place]   If your splint gets wet for any reason please contact the office  immediately. You may shower in your splint or cast as long as you keep it dry.  This can be done by wrapping in a cast cover or garbage back (or similar)  Do Not stick any thing down your splint or cast such as pencils, money, or hangers to try and scratch yourself with.  If you feel itchy take benadryl as prescribed on the bottle for itching  IF YOU ARE IN A CAM BOOT (BLACK BOOT)  You may remove boot periodically. Perform daily dressing changes as noted below.  Wash the liner of the boot regularly and wear a sock when wearing the boot. It is recommended that you sleep in the boot until told otherwise    Call office for the following:  Temperature greater than 101F  Persistent nausea and vomiting  Severe uncontrolled pain  Redness, tenderness, or signs of infection (pain, swelling, redness, odor  or green/yellow discharge around the site)  Difficulty breathing, headache or visual disturbances  Hives  Persistent dizziness or light-headedness  Extreme fatigue  Any other questions or concerns you may have after discharge  In an emergency, call 911 or go to an Emergency Department at a nearby hospital    [place]: [place] INFORMATION: [email or web address]

## 2020-04-23 NOTE — Progress Notes (Signed)
Patient had medium sized bowel movement, small to medium sized separate lumps, noted suppository given last night. See mar.

## 2020-04-24 LAB — CBC WITH DIFFERENTIAL/PLATELET
Abs Immature Granulocytes: 0.07 10*3/uL (ref 0.00–0.07)
Basophils Absolute: 0 10*3/uL (ref 0.0–0.1)
Basophils Relative: 0 %
Eosinophils Absolute: 0.4 10*3/uL (ref 0.0–0.5)
Eosinophils Relative: 5 %
HCT: 24.3 % — ABNORMAL LOW (ref 36.0–46.0)
Hemoglobin: 7.8 g/dL — ABNORMAL LOW (ref 12.0–15.0)
Immature Granulocytes: 1 %
Lymphocytes Relative: 16 %
Lymphs Abs: 1.5 10*3/uL (ref 0.7–4.0)
MCH: 33.1 pg (ref 26.0–34.0)
MCHC: 32.1 g/dL (ref 30.0–36.0)
MCV: 103 fL — ABNORMAL HIGH (ref 80.0–100.0)
Monocytes Absolute: 1.4 10*3/uL — ABNORMAL HIGH (ref 0.1–1.0)
Monocytes Relative: 16 %
Neutro Abs: 5.5 10*3/uL (ref 1.7–7.7)
Neutrophils Relative %: 62 %
Platelets: 274 10*3/uL (ref 150–400)
RBC: 2.36 MIL/uL — ABNORMAL LOW (ref 3.87–5.11)
RDW: 13.6 % (ref 11.5–15.5)
WBC: 9 10*3/uL (ref 4.0–10.5)
nRBC: 0 % (ref 0.0–0.2)

## 2020-04-24 NOTE — Plan of Care (Signed)

## 2020-04-24 NOTE — Progress Notes (Signed)
Family concerned about pt acting "childish," this RN attempted to explain that pt was alert and oriented x4, and could answer all questions appropriately. RN also tried to explain that pt was newly taking narcotics for pain management, which could also explain the change in behavior. Family was insistent on contacting provider. Dr.Chiu returned page and recommended giving pt only 1 tab of norco/vicidoin to see if that improves behavior and continues to adequately manage pt pain. Will continue to monitor pt.

## 2020-04-24 NOTE — Progress Notes (Signed)
Physical Therapy Treatment Patient Details Name: Savannah Coleman MRN: [id number] DOB: May 13, 1931 Today's Date: 04/24/2020    History of Present Illness Savannah Coleman is a 86 y.o. female with medical history significant of HTN, prior R femur fx in 2015. Pt presents to ED after she slipped and fell in shower. S/p right hip IM nail and right wrist ORIF.    PT Comments    Patient is making progress toward PT goals and tolerated increased gait distance. Pt with L wrist pain limiting gait training due to inability to bear weight on RW so bilat platform RW used this session. Continue to recommend post acute rehab for further skilled PT services to maximize independence and safety with mobility.     Follow Up Recommendations  Other (comment)(IRF)     Equipment Recommendations  Rolling walker with 5" wheels    Recommendations for Other Services       Precautions / Restrictions Precautions Precautions: Fall Restrictions Weight Bearing Restrictions: Yes RUE Weight Bearing: Weight bear through elbow only RLE Weight Bearing: Weight bearing as tolerated    Mobility  Bed Mobility Overal bed mobility: Needs Assistance Bed Mobility: Supine to Sit     Supine to sit: Mod assist     General bed mobility comments: assist to bring R LE and hips to EOB and then to elevate trunk into sitting; cues for sequencing   Transfers Overall transfer level: Needs assistance Equipment used: Right platform walker;Left platform walker Transfers: Sit to/from Stand Sit to Stand: Min assist         General transfer comment: pt stood multiple times from EOB and with min A to steady and power up into standing; initially used R PFRW however pt's L wrist too painful to bear weight for gait training so L platform added  Ambulation/Gait Ambulation/Gait assistance: +2 safety/equipment;Min assist Gait Distance (Feet): 45 Feet Assistive device: (bilat platform RW) Gait Pattern/deviations: Step-to  pattern;Decreased stride length;Decreased step length - left;Decreased stance time - right;Step-through pattern     General Gait Details: cues for urpight posture and sequencing; assist to manage RW and to steady; chair follow; pt with improving step through pattern with incresaed distance    Stairs             Wheelchair Mobility    Modified Rankin (Stroke Patients Only)       Balance Overall balance assessment: Needs assistance Sitting-balance support: No upper extremity supported;Feet supported Sitting balance-Leahy Scale: Fair     Standing balance support: Bilateral upper extremity supported;During functional activity Standing balance-Leahy Scale: Poor                              Cognition Arousal/Alertness: Awake/alert Behavior During Therapy: WFL for tasks assessed/performed Overall Cognitive Status: Within Functional Limits for tasks assessed                                        Exercises General Exercises - Lower Extremity Heel Slides: Right;10 reps;AROM;Seated;Other (comment)(paper towel under foot to help slide) Heel Raises: AROM;Right;10 reps;Seated    General Comments        Pertinent Vitals/Pain Pain Assessment: Faces Faces Pain Scale: Hurts little more Pain Location: L wrist and RLE Pain Descriptors / Indicators: Grimacing;Guarding;Sore Pain Intervention(s): Limited activity within patient's tolerance;Monitored during session;Repositioned    Home Living  Prior Function            PT Goals (current goals can now be found in the care plan section) Progress towards PT goals: Progressing toward goals    Frequency    Min 4X/week      PT Plan Current plan remains appropriate    Co-evaluation              AM-PAC PT "6 Clicks" Mobility   Outcome Measure  Help needed turning from your back to your side while in a flat bed without using bedrails?: A Lot Help needed  moving from lying on your back to sitting on the side of a flat bed without using bedrails?: A Lot Help needed moving to and from a bed to a chair (including a wheelchair)?: A Lot Help needed standing up from a chair using your arms (e.g., wheelchair or bedside chair)?: A Little Help needed to walk in hospital room?: A Little Help needed climbing 3-5 steps with a railing? : Total 6 Click Score: 13    End of Session Equipment Utilized During Treatment: Gait belt Activity Tolerance: Patient tolerated treatment well Patient left: in chair;with call bell/phone within reach;with chair alarm set;with family/visitor present Nurse Communication: Mobility status PT Visit Diagnosis: Unsteadiness on feet (R26.81);History of falling (Z91.81);Difficulty in walking, not elsewhere classified (R26.2);Pain Pain - Right/Left: Right Pain - part of body: Arm;Hip     Time: 1126-1208 PT Time Calculation (min) (ACUTE ONLY): 42 min  Charges:  $Gait Training: 23-37 mins                     Earney Navy, PTA Acute Rehabilitation Services Pager: [phone] Office: [phone]     Darliss Cheney 04/24/2020, 1:56 PM

## 2020-04-24 NOTE — Plan of Care (Signed)
  Problem: Activity: Goal: Ability to ambulate and perform ADLs will improve Outcome: Progressing   Problem: Self-Concept: Goal: Ability to maintain and perform role responsibilities to the fullest extent possible will improve Outcome: Progressing   Problem: Clinical Measurements: Goal: Ability to maintain clinical measurements within normal limits will improve Outcome: Progressing   Problem: Activity: Goal: Risk for activity intolerance will decrease Outcome: Progressing   Problem: Nutrition: Goal: Adequate nutrition will be maintained Outcome: Progressing

## 2020-04-24 NOTE — Progress Notes (Signed)
PROGRESS NOTE    Savannah Coleman  [id number] DOB: [birth year] DOA: 04/17/2020 PCP: Jonathon Resides, MD    Brief Narrative:  86 y.o.femalewith medical history significant ofHTN, prior R femur fx in 2015. Pt presents to ED after she slipped and fell in shower. Pain to both wrists and R femur. Unable to bear wait, pain worse with movement. Couldn't bend or walk R leg. In the ED, noted a R wrist fx, and a much more complicated R perihardware lesser trochanter fracture of femur.  EDP consulted orthopedics.  Patient admitted for further management.  Assessment & Plan:   Principal Problem:   Intertrochanteric fracture of right hip [facility]) Active Problems:   Hypertension   Closed fracture of right distal radius   Right periprosthetic intertrochanteric hip fracture s/p surgery on 04/19/2020 S/p mechanical fall X-ray with avulsion fx of the lesser trochanter of the R femur at the level of the proximal threaded screw CT head/cervical spine showed no acute fracture or intracranial abnormality Orthopedics on board. Pt now s/p surgery on 5/3 Pain management, DVT PPx as per orthopedics PT/OT following, recs for inpt rehab facility noted, TOC is following  Right wrist fracture s/p ORIF on 04/19/2020 X-ray showed mildly displaced transverse fracture of the right radial head Orthopedics on board, cont to follow recs PT/OT continues to follow  Leukocytosis Likely reactive secondary to above fractures Labs reviewed. WBC had normalized  Macrocytic anemia/post op anemia Anemia panel unremarkable No evidence of acute bleed at this time Slight trended down to 7.4 Now on PO iron and vit C Hgb today 7.8. Plan to transfuse if hgb drops to <7.0  Hypertension Continue home amlodipine BP stable and controlled at this time  Hyperlipidemia Continue home statin as pt tolerates  GERD Continue PPI as pt tolerates  Constipation Results noted with cathartics overnight Cont with cathartics  as needed  DVT prophylaxis: Lovenox subQ Code Status: Full Family Communication: Pt in room, family not at bedside  Status is: Inpatient  Remains inpatient appropriate because:Unsafe d/c plan, awaiting SNF bed   Dispo: The patient is from: Home              Anticipated d/c is to: inpatient rehab facility              Anticipated d/c date is: 3 days              Patient currently is medically stable to d/c.   Consultants:   Orthopedic Surgery  Procedures:  1. RIGHT HIP ANTEGRADE INTRAMEDULLARY NAILING WITH BIOMET AFFIXUS 11 MM X 360 MM  2. REMOVAL OF BIOMET RETROGRADE NAIL (Right) 3. Open Reduction Internal Fixation (Orif) Wrist Fracture (Right) DISTAL RADIUS DVR PLATE  Antimicrobials: Anti-infectives (From admission, onward)   Start     Dose/Rate Route Frequency Ordered Stop   04/19/20 0731  ceFAZolin (ANCEF) 2-4 GM/100ML-% IVPB    Note to Pharmacy: Henrine Screws   : cabinet override      04/19/20 0731 04/19/20 0848   04/19/20 0730  ceFAZolin (ANCEF) IVPB 2g/100 mL premix     2 g 200 mL/hr over 30 Minutes Intravenous On call to O.R. 04/19/20 [id number] 04/19/20 0906      Subjective: Without complaints this AM  Objective: Vitals:   04/23/20 1450 04/23/20 2030 04/24/20 0418 04/24/20 0839  BP: (!) 113/55 (!) 116/55 (!) 141/75 128/72  Pulse: 84 85 86 83  Resp: 17 16 17 18   Temp: 98.2 F (36.8 C) 98.2 F (36.8 C) 98.6  F (37 C) (!) 97.4 F (36.3 C)  TempSrc: Oral Oral Oral Oral  SpO2: 93% 95% 96% 97%  Weight:      Height:        Intake/Output Summary (Last 24 hours) at 04/24/2020 1354 Last data filed at 04/24/2020 0418 Gross per 24 hour  Intake 240 ml  Output 1000 ml  Net -760 ml   Filed Weights   04/17/20 1424 04/19/20 0654  Weight: 57.2 kg 57.2 kg    Examination: General exam: Awake, laying in bed, in nad Respiratory system: Normal respiratory effort, no wheezing Cardiovascular system: regular rate, s1, s2 Gastrointestinal system: Soft, nondistended,  positive BS Central nervous system: CN2-12 grossly intact, strength intact Extremities: Perfused, no clubbing Skin: Normal skin turgor, no notable skin lesions seen Psychiatry: Mood normal // no visual hallucinations   Data Reviewed: I have personally reviewed following labs and imaging studies  CBC: Recent Labs  Lab 04/20/20 0652 04/21/20 0510 04/22/20 0308 04/23/20 0427 04/24/20 0241  WBC 11.9* 10.0 8.5 8.1 9.0  NEUTROABS 9.3* 7.1 5.4 4.9 5.5  HGB 8.2* 8.1* 7.4* 7.6* 7.8*  HCT 24.9* 24.6* 22.8* 23.6* 24.3*  MCV 102.5* 102.1* 102.2* 102.6* 103.0*  PLT 139* 157 173 213 [id number]   Basic Metabolic Panel: Recent Labs  Lab 04/19/20 0555 04/20/20 0652 04/21/20 0510 04/22/20 0308 04/23/20 0427  NA 135 135 136 136 138  K 3.4* 3.8 3.7 3.6 3.5  CL 99 101 100 102 102  CO2 28 27 29 30  32  GLUCOSE 173* 101* 104* 102* 99  BUN 8 11 15 13 10   CREATININE 0.75 0.70 0.66 0.62 0.58  CALCIUM 9.3 9.2 9.2 8.8* 9.0   GFR: Estimated Creatinine Clearance: 39.9 mL/min (by C-G formula based on SCr of 0.58 mg/dL). Liver Function Tests: Recent Labs  Lab 04/17/20 1600  AST 43*  ALT 23  ALKPHOS 56  BILITOT 0.9  PROT 6.4*  ALBUMIN 3.9   No results for input(s): LIPASE, AMYLASE in the last 168 hours. No results for input(s): AMMONIA in the last 168 hours. Coagulation Profile: No results for input(s): INR, PROTIME in the last 168 hours. Cardiac Enzymes: No results for input(s): CKTOTAL, CKMB, CKMBINDEX, TROPONINI in the last 168 hours. BNP (last 3 results) No results for input(s): PROBNP in the last 8760 hours. HbA1C: No results for input(s): HGBA1C in the last 72 hours. CBG: No results for input(s): GLUCAP in the last 168 hours. Lipid Profile: No results for input(s): CHOL, HDL, LDLCALC, TRIG, CHOLHDL, LDLDIRECT in the last 72 hours. Thyroid Function Tests: No results for input(s): TSH, T4TOTAL, FREET4, T3FREE, THYROIDAB in the last 72 hours. Anemia Panel: No results for input(s):  VITAMINB12, FOLATE, FERRITIN, TIBC, IRON, RETICCTPCT in the last 72 hours. Sepsis Labs: No results for input(s): PROCALCITON, LATICACIDVEN in the last 168 hours.  Recent Results (from the past 240 hour(s))  Respiratory Panel by RT PCR (Flu A&B, Covid) - Nasopharyngeal Swab     Status: None   Collection Time: 04/17/20  4:48 PM   Specimen: Nasopharyngeal Swab  Result Value Ref Range Status   SARS Coronavirus 2 by RT PCR NEGATIVE NEGATIVE Final    Comment: (NOTE) SARS-CoV-2 target nucleic acids are NOT DETECTED. The SARS-CoV-2 RNA is generally detectable in upper respiratoy specimens during the acute phase of infection. The lowest concentration of SARS-CoV-2 viral copies this assay can detect is 131 copies/mL. A negative result does not preclude SARS-Cov-2 infection and should not be used as the sole basis for treatment  or other patient management decisions. A negative result may occur with  improper specimen collection/handling, submission of specimen other than nasopharyngeal swab, presence of viral mutation(s) within the areas targeted by this assay, and inadequate number of viral copies (<131 copies/mL). A negative result must be combined with clinical observations, patient history, and epidemiological information. The expected result is Negative. Fact Sheet for Patients:  PinkCheek.be Fact Sheet for Healthcare Providers:  GravelBags.it This test is not yet ap proved or cleared by the [place] FDA and  has been authorized for detection and/or diagnosis of SARS-CoV-2 by FDA under an Emergency Use Authorization (EUA). This EUA will remain  in effect (meaning this test can be used) for the duration of the COVID-19 declaration under Section 564(b)(1) of the Act, 21 U.S.C. section 360bbb-3(b)(1), unless the authorization is terminated or revoked sooner.    Influenza A by PCR NEGATIVE NEGATIVE Final   Influenza B by PCR  NEGATIVE NEGATIVE Final    Comment: (NOTE) The Xpert Xpress SARS-CoV-2/FLU/RSV assay is intended as an aid in  the diagnosis of influenza from Nasopharyngeal swab specimens and  should not be used as a sole basis for treatment. Nasal washings and  aspirates are unacceptable for Xpert Xpress SARS-CoV-2/FLU/RSV  testing. Fact Sheet for Patients: PinkCheek.be Fact Sheet for Healthcare Providers: GravelBags.it This test is not yet approved or cleared by the [place] FDA and  has been authorized for detection and/or diagnosis of SARS-CoV-2 by  FDA under an Emergency Use Authorization (EUA). This EUA will remain  in effect (meaning this test can be used) for the duration of the  Covid-19 declaration under Section 564(b)(1) of the Act, 21  U.S.C. section 360bbb-3(b)(1), unless the authorization is  terminated or revoked. Performed at [facility], [place] [address] [place], [place] [address]   Surgical PCR screen     Status: None   Collection Time: 04/17/20 10:31 PM   Specimen: Nasal Mucosa; Nasal Swab  Result Value Ref Range Status   MRSA, PCR NEGATIVE NEGATIVE Final   Staphylococcus aureus NEGATIVE NEGATIVE Final    Comment: (NOTE) The Xpert SA Assay (FDA approved for NASAL specimens in patients 63 years of age and older), is one component of a comprehensive surveillance program. It is not intended to diagnose infection nor to guide or monitor treatment. Performed at [facility], [place] [address], [place], [place] [address]      Radiology Studies: No results found.  Scheduled Meds: . acetaminophen  500 mg Oral Q12H  . amLODipine  5 mg Oral Daily  . vitamin C  500 mg Oral Daily  . bacitracin   Topical BID  . docusate sodium  100 mg Oral BID  . enoxaparin (LOVENOX) injection  40 mg Subcutaneous Q24H  . ferrous sulfate  325 mg Oral Q breakfast  . multivitamin with minerals  1 tablet Oral Daily  .  pantoprazole  40 mg Oral Daily  . polyethylene glycol  17 g Oral Daily  . simvastatin  20 mg Oral QHS   Continuous Infusions:   LOS: 7 days   Marylu Lund, MD [facility] Pager On Amion  If 7PM-7AM, please contact night-coverage 04/24/2020, 1:54 PM

## 2020-04-25 ENCOUNTER — Encounter (HOSPITAL_COMMUNITY): Payer: Self-pay | Admitting: Internal Medicine

## 2020-04-25 LAB — CBC WITH DIFFERENTIAL/PLATELET
Abs Immature Granulocytes: 0.05 10*3/uL (ref 0.00–0.07)
Basophils Absolute: 0 10*3/uL (ref 0.0–0.1)
Basophils Relative: 1 %
Eosinophils Absolute: 0.4 10*3/uL (ref 0.0–0.5)
Eosinophils Relative: 5 %
HCT: 24.2 % — ABNORMAL LOW (ref 36.0–46.0)
Hemoglobin: 7.8 g/dL — ABNORMAL LOW (ref 12.0–15.0)
Immature Granulocytes: 1 %
Lymphocytes Relative: 19 %
Lymphs Abs: 1.6 10*3/uL (ref 0.7–4.0)
MCH: 33.5 pg (ref 26.0–34.0)
MCHC: 32.2 g/dL (ref 30.0–36.0)
MCV: 103.9 fL — ABNORMAL HIGH (ref 80.0–100.0)
Monocytes Absolute: 1.4 10*3/uL — ABNORMAL HIGH (ref 0.1–1.0)
Monocytes Relative: 17 %
Neutro Abs: 4.6 10*3/uL (ref 1.7–7.7)
Neutrophils Relative %: 57 %
Platelets: 320 10*3/uL (ref 150–400)
RBC: 2.33 MIL/uL — ABNORMAL LOW (ref 3.87–5.11)
RDW: 14.1 % (ref 11.5–15.5)
WBC: 8 10*3/uL (ref 4.0–10.5)
nRBC: 0 % (ref 0.0–0.2)

## 2020-04-25 MED ORDER — HYDROCODONE-ACETAMINOPHEN 5-325 MG PO TABS
1.0000 | ORAL_TABLET | Freq: Four times a day (QID) | ORAL | Status: DC | PRN
  Administered 2020-04-27 – 2020-05-01 (×8): 1 via ORAL
  Filled 2020-04-25 (×8): qty 1

## 2020-04-25 NOTE — Progress Notes (Signed)
PROGRESS NOTE    Savannah Coleman  [id number] DOB: 1931/02/14 DOA: 04/17/2020 PCP: Jonathon Resides, MD    Brief Narrative:  86 y.o.femalewith medical history significant ofHTN, prior R femur fx in 2015. Pt presents to ED after she slipped and fell in shower. Pain to both wrists and R femur. Unable to bear wait, pain worse with movement. Couldn't bend or walk R leg. In the ED, noted a R wrist fx, and a much more complicated R perihardware lesser trochanter fracture of femur.  EDP consulted orthopedics.  Patient admitted for further management.  Assessment & Plan:   Principal Problem:   Intertrochanteric fracture of right hip [facility]) Active Problems:   Hypertension   Closed fracture of right distal radius   Right periprosthetic intertrochanteric hip fracture s/p surgery on 04/19/2020 S/p mechanical fall X-ray with avulsion fx of the lesser trochanter of the R femur at the level of the proximal threaded screw CT head/cervical spine showed no acute fracture or intracranial abnormality Orthopedics on board. Pt now s/p surgery on 5/3 Pain management, DVT PPx as per orthopedics PT/OT following, recs for inpt rehab facility noted, TOC is following  Right wrist fracture s/p ORIF on 04/19/2020 X-ray showed mildly displaced transverse fracture of the right radial head Orthopedics on board, cont to follow recs PT/OT following  Leukocytosis Likely reactive secondary to above fractures Labs reviewed. WBC had normalized  Macrocytic anemia/post op anemia Anemia panel unremarkable No evidence of acute bleed at this time Slight trended down to 7.4 Now on PO iron and vit C Hgb today 7.8. Plan to transfuse if hgb drops to <7.0  Hypertension Continue home amlodipine BP stable and controlled at this time  Hyperlipidemia Continue home statin as pt tolerates  GERD Continue PPI as pt tolerates  Constipation Results noted with cathartics overnight Cont with cathartics as  needed  DVT prophylaxis: Lovenox subQ Code Status: Full Family Communication: Pt in room, family not at bedside  Status is: Inpatient  Remains inpatient appropriate because:Unsafe d/c plan, awaiting SNF bed   Dispo: The patient is from: Home              Anticipated d/c is to: inpatient rehab facility              Anticipated d/c date is: 3 days              Patient currently is medically stable to d/c.   Consultants:   Orthopedic Surgery  Procedures:  1. RIGHT HIP ANTEGRADE INTRAMEDULLARY NAILING WITH BIOMET AFFIXUS 11 MM X 360 MM  2. REMOVAL OF BIOMET RETROGRADE NAIL (Right) 3. Open Reduction Internal Fixation (Orif) Wrist Fracture (Right) DISTAL RADIUS DVR PLATE  Antimicrobials: Anti-infectives (From admission, onward)   Start     Dose/Rate Route Frequency Ordered Stop   04/19/20 0731  ceFAZolin (ANCEF) 2-4 GM/100ML-% IVPB    Note to Pharmacy: Henrine Screws   : cabinet override      04/19/20 0731 04/19/20 0848   04/19/20 0730  ceFAZolin (ANCEF) IVPB 2g/100 mL premix     2 g 200 mL/hr over 30 Minutes Intravenous On call to O.R. 04/19/20 0728 04/19/20 0906      Subjective: Complaining of generalized pain, worse with PT  Objective: Vitals:   04/24/20 1523 04/24/20 2000 04/25/20 0356 04/25/20 0815  BP: 124/66 122/66 137/76 126/68  Pulse: 81 84 85 87  Resp: 18 16 16 19   Temp: 98.1 F (36.7 C) 98.3 F (36.8 C) 98.1 F (36.7  C) 98.2 F (36.8 C)  TempSrc: Oral Oral Oral Oral  SpO2: 97% 100% 97% 95%  Weight:      Height:        Intake/Output Summary (Last 24 hours) at 04/25/2020 1515 Last data filed at 04/25/2020 [id number] Gross per 24 hour  Intake 240 ml  Output 1100 ml  Net -860 ml   Filed Weights   04/17/20 1424 04/19/20 0654  Weight: 57.2 kg 57.2 kg    Examination: General exam: Conversant, in no acute distress Respiratory system: normal chest rise, clear, no audible wheezing Cardiovascular system: regular rhythm, s1-s2 Gastrointestinal system:  Nondistended, nontender, pos BS Central nervous system: No seizures, no tremors Extremities: No cyanosis, no joint deformities Skin: No rashes, no pallor Psychiatry: Affect normal // no auditory hallucinations   Data Reviewed: I have personally reviewed following labs and imaging studies  CBC: Recent Labs  Lab 04/21/20 0510 04/22/20 0308 04/23/20 0427 04/24/20 0241 04/25/20 0442  WBC 10.0 8.5 8.1 9.0 8.0  NEUTROABS 7.1 5.4 4.9 5.5 4.6  HGB 8.1* 7.4* 7.6* 7.8* 7.8*  HCT 24.6* 22.8* 23.6* 24.3* 24.2*  MCV 102.1* 102.2* 102.6* 103.0* 103.9*  PLT 157 173 213 274 [id number]   Basic Metabolic Panel: Recent Labs  Lab 04/19/20 0555 04/20/20 0652 04/21/20 0510 04/22/20 0308 04/23/20 0427  NA 135 135 136 136 138  K 3.4* 3.8 3.7 3.6 3.5  CL 99 101 100 102 102  CO2 28 27 29 30  32  GLUCOSE 173* 101* 104* 102* 99  BUN 8 11 15 13 10   CREATININE 0.75 0.70 0.66 0.62 0.58  CALCIUM 9.3 9.2 9.2 8.8* 9.0   GFR: Estimated Creatinine Clearance: 39.9 mL/min (by C-G formula based on SCr of 0.58 mg/dL). Liver Function Tests: No results for input(s): AST, ALT, ALKPHOS, BILITOT, PROT, ALBUMIN in the last 168 hours. No results for input(s): LIPASE, AMYLASE in the last 168 hours. No results for input(s): AMMONIA in the last 168 hours. Coagulation Profile: No results for input(s): INR, PROTIME in the last 168 hours. Cardiac Enzymes: No results for input(s): CKTOTAL, CKMB, CKMBINDEX, TROPONINI in the last 168 hours. BNP (last 3 results) No results for input(s): PROBNP in the last 8760 hours. HbA1C: No results for input(s): HGBA1C in the last 72 hours. CBG: No results for input(s): GLUCAP in the last 168 hours. Lipid Profile: No results for input(s): CHOL, HDL, LDLCALC, TRIG, CHOLHDL, LDLDIRECT in the last 72 hours. Thyroid Function Tests: No results for input(s): TSH, T4TOTAL, FREET4, T3FREE, THYROIDAB in the last 72 hours. Anemia Panel: No results for input(s): VITAMINB12, FOLATE, FERRITIN,  TIBC, IRON, RETICCTPCT in the last 72 hours. Sepsis Labs: No results for input(s): PROCALCITON, LATICACIDVEN in the last 168 hours.  Recent Results (from the past 240 hour(s))  Respiratory Panel by RT PCR (Flu A&B, Covid) - Nasopharyngeal Swab     Status: None   Collection Time: 04/17/20  4:48 PM   Specimen: Nasopharyngeal Swab  Result Value Ref Range Status   SARS Coronavirus 2 by RT PCR NEGATIVE NEGATIVE Final    Comment: (NOTE) SARS-CoV-2 target nucleic acids are NOT DETECTED. The SARS-CoV-2 RNA is generally detectable in upper respiratoy specimens during the acute phase of infection. The lowest concentration of SARS-CoV-2 viral copies this assay can detect is 131 copies/mL. A negative result does not preclude SARS-Cov-2 infection and should not be used as the sole basis for treatment or other patient management decisions. A negative result may occur with  improper specimen collection/handling, submission of  specimen other than nasopharyngeal swab, presence of viral mutation(s) within the areas targeted by this assay, and inadequate number of viral copies (<131 copies/mL). A negative result must be combined with clinical observations, patient history, and epidemiological information. The expected result is Negative. Fact Sheet for Patients:  PinkCheek.be Fact Sheet for Healthcare Providers:  GravelBags.it This test is not yet ap proved or cleared by the [place] FDA and  has been authorized for detection and/or diagnosis of SARS-CoV-2 by FDA under an Emergency Use Authorization (EUA). This EUA will remain  in effect (meaning this test can be used) for the duration of the COVID-19 declaration under Section 564(b)(1) of the Act, 21 U.S.C. section 360bbb-3(b)(1), unless the authorization is terminated or revoked sooner.    Influenza A by PCR NEGATIVE NEGATIVE Final   Influenza B by PCR NEGATIVE NEGATIVE Final     Comment: (NOTE) The Xpert Xpress SARS-CoV-2/FLU/RSV assay is intended as an aid in  the diagnosis of influenza from Nasopharyngeal swab specimens and  should not be used as a sole basis for treatment. Nasal washings and  aspirates are unacceptable for Xpert Xpress SARS-CoV-2/FLU/RSV  testing. Fact Sheet for Patients: PinkCheek.be Fact Sheet for Healthcare Providers: GravelBags.it This test is not yet approved or cleared by the [place] FDA and  has been authorized for detection and/or diagnosis of SARS-CoV-2 by  FDA under an Emergency Use Authorization (EUA). This EUA will remain  in effect (meaning this test can be used) for the duration of the  Covid-19 declaration under Section 564(b)(1) of the Act, 21  U.S.C. section 360bbb-3(b)(1), unless the authorization is  terminated or revoked. Performed at [facility], [place] [address], [place], [place] [address]   Surgical PCR screen     Status: None   Collection Time: 04/17/20 10:31 PM   Specimen: Nasal Mucosa; Nasal Swab  Result Value Ref Range Status   MRSA, PCR NEGATIVE NEGATIVE Final   Staphylococcus aureus NEGATIVE NEGATIVE Final    Comment: (NOTE) The Xpert SA Assay (FDA approved for NASAL specimens in patients 41 years of age and older), is one component of a comprehensive surveillance program. It is not intended to diagnose infection nor to guide or monitor treatment. Performed at [facility], [place] [address], [place], [place] [address]      Radiology Studies: No results found.  Scheduled Meds: . acetaminophen  500 mg Oral Q12H  . amLODipine  5 mg Oral Daily  . vitamin C  500 mg Oral Daily  . bacitracin   Topical BID  . docusate sodium  100 mg Oral BID  . enoxaparin (LOVENOX) injection  40 mg Subcutaneous Q24H  . ferrous sulfate  325 mg Oral Q breakfast  . multivitamin with minerals  1 tablet Oral Daily  . pantoprazole  40 mg Oral Daily  .  polyethylene glycol  17 g Oral Daily  . simvastatin  20 mg Oral QHS   Continuous Infusions:   LOS: 8 days   Marylu Lund, MD [facility] Pager On Amion  If 7PM-7AM, please contact night-coverage 04/25/2020, 3:15 PM

## 2020-04-25 NOTE — Plan of Care (Signed)
  Problem: Clinical Measurements: Goal: Will remain free from infection Outcome: Progressing Goal: Respiratory complications will improve Outcome: Progressing   Problem: Activity: Goal: Risk for activity intolerance will decrease Outcome: Progressing   

## 2020-04-25 NOTE — Plan of Care (Signed)
  Problem: Education: Goal: Knowledge of General Education information will improve Description: Including pain rating scale, medication(s)/side effects and non-pharmacologic comfort measures Outcome: Progressing   Problem: Health Behavior/Discharge Planning: Goal: Ability to manage health-related needs will improve Outcome: Progressing   Problem: Clinical Measurements: Goal: Ability to maintain clinical measurements within normal limits will improve Outcome: Progressing   Problem: Elimination: Goal: Will not experience complications related to bowel motility Outcome: Progressing   Problem: Pain Managment: Goal: General experience of comfort will improve Outcome: Progressing   Problem: Safety: Goal: Ability to remain free from injury will improve Outcome: Progressing

## 2020-04-25 NOTE — Brief Op Note (Signed)
04/19/2020 [id number] 

## 2020-04-26 DIAGNOSIS — W19XXXS Unspecified fall, sequela: Secondary | ICD-10-CM

## 2020-04-26 LAB — COMPREHENSIVE METABOLIC PANEL
ALT: 30 U/L (ref 0–44)
AST: 31 U/L (ref 15–41)
Albumin: 2.6 g/dL — ABNORMAL LOW (ref 3.5–5.0)
Alkaline Phosphatase: 68 U/L (ref 38–126)
Anion gap: 9 (ref 5–15)
BUN: 10 mg/dL (ref 8–23)
CO2: 29 mmol/L (ref 22–32)
Calcium: 9.2 mg/dL (ref 8.9–10.3)
Chloride: 101 mmol/L (ref 98–111)
Creatinine, Ser: 0.66 mg/dL (ref 0.44–1.00)
GFR calc Af Amer: >60 mL/min (ref >60)
GFR calc non Af Amer: >60 mL/min (ref >60)
Glucose, Bld: 102 mg/dL — ABNORMAL HIGH (ref 70–99)
Potassium: 3.4 mmol/L — ABNORMAL LOW (ref 3.5–5.1)
Sodium: 139 mmol/L (ref 135–145)
Total Bilirubin: 1 mg/dL (ref 0.3–1.2)
Total Protein: 5.3 g/dL — ABNORMAL LOW (ref 6.5–8.1)

## 2020-04-26 LAB — CBC WITH DIFFERENTIAL/PLATELET
Abs Immature Granulocytes: 0.05 10*3/uL (ref 0.00–0.07)
Basophils Absolute: 0 10*3/uL (ref 0.0–0.1)
Basophils Relative: 1 %
Eosinophils Absolute: 0.3 10*3/uL (ref 0.0–0.5)
Eosinophils Relative: 4 %
HCT: 25.5 % — ABNORMAL LOW (ref 36.0–46.0)
Hemoglobin: 7.9 g/dL — ABNORMAL LOW (ref 12.0–15.0)
Immature Granulocytes: 1 %
Lymphocytes Relative: 15 %
Lymphs Abs: 1.2 10*3/uL (ref 0.7–4.0)
MCH: 31.9 pg (ref 26.0–34.0)
MCHC: 31 g/dL (ref 30.0–36.0)
MCV: 102.8 fL — ABNORMAL HIGH (ref 80.0–100.0)
Monocytes Absolute: 1.2 10*3/uL — ABNORMAL HIGH (ref 0.1–1.0)
Monocytes Relative: 16 %
Neutro Abs: 5.2 10*3/uL (ref 1.7–7.7)
Neutrophils Relative %: 63 %
Platelets: 381 10*3/uL (ref 150–400)
RBC: 2.48 MIL/uL — ABNORMAL LOW (ref 3.87–5.11)
RDW: 14.6 % (ref 11.5–15.5)
WBC: 7.9 10*3/uL (ref 4.0–10.5)
nRBC: 0 % (ref 0.0–0.2)

## 2020-04-26 MED ORDER — POTASSIUM CHLORIDE CRYS ER 20 MEQ PO TBCR
40.0000 meq | EXTENDED_RELEASE_TABLET | Freq: Once | ORAL | Status: AC
  Administered 2020-04-26: 40 meq via ORAL
  Filled 2020-04-26: qty 2

## 2020-04-26 NOTE — Plan of Care (Signed)
  Problem: Education: Goal: Knowledge of General Education information will improve Description: Including pain rating scale, medication(s)/side effects and non-pharmacologic comfort measures Outcome: Progressing   Problem: Health Behavior/Discharge Planning: Goal: Ability to manage health-related needs will improve Outcome: Progressing   Problem: Clinical Measurements: Goal: Ability to maintain clinical measurements within normal limits will improve Outcome: Progressing   Problem: Activity: Goal: Risk for activity intolerance will decrease Outcome: Progressing   Problem: Elimination: Goal: Will not experience complications related to bowel motility Outcome: Progressing   Problem: Safety: Goal: Ability to remain free from injury will improve Outcome: Progressing   

## 2020-04-26 NOTE — Progress Notes (Signed)
PROGRESS NOTE    Savannah Coleman  [id number] DOB: 06-21-1931 DOA: 04/17/2020 PCP: Jonathon Resides, MD    Brief Narrative:  86 y.o.femalewith medical history significant ofHTN, prior R femur fx in 2015. Pt presents to ED after she slipped and fell in shower. Pain to both wrists and R femur. Unable to bear wait, pain worse with movement. Couldn't bend or walk R leg. In the ED, noted a R wrist fx, and a much more complicated R perihardware lesser trochanter fracture of femur.  S/P surgery on wrist and hip.   Assessment & Plan:   Principal Problem:   Intertrochanteric fracture of right hip [facility]) Active Problems:   Hypertension   Closed fracture of right distal radius   Right periprosthetic intertrochanteric hip fracture s/p surgery on 04/19/2020 S/p mechanical fall X-ray with avulsion fx of the lesser trochanter of the R femur at the level of the proximal threaded screw CT head/cervical spine showed no acute fracture or intracranial abnormality Orthopedics : s/p surgery on 5/3 Pain management, DVT PPx as per orthopedics PT/OT following, recs for inpt rehab facility noted, TOC is following  Right wrist fracture s/p ORIF on 04/19/2020 X-ray showed mildly displaced transverse fracture of the right radial head Orthopedics on board, cont to follow recs PT/OT following  Leukocytosis Likely reactive secondary to above fractures Labs reviewed. WBC had normalized  Macrocytic anemia/post op anemia Anemia panel unremarkable No evidence of acute bleed at this time Slight trended down to 7.4 Now on PO iron and vit C Plan to transfuse if hgb drops to <7.0  Hypertension Continue home amlodipine BP stable and controlled at this time  Hyperlipidemia Continue home statin as pt tolerates  GERD Continue PPI as pt tolerates  Hypokalemia -replete  Constipation -bowel regimen  DVT prophylaxis: Lovenox subQ Code Status: Full Family Communication: Pt in room, family not  at bedside  Status is: Inpatient  Remains inpatient appropriate because:Unsafe d/c plan, awaiting rehab bed   Dispo: The patient is from: Home              Anticipated d/c is to: inpatient rehab facility              Anticipated d/c date is: when bed available               Patient currently is medically stable to d/c.   Consultants:   Orthopedic Surgery  Procedures:  1. RIGHT HIP ANTEGRADE INTRAMEDULLARY NAILING WITH BIOMET AFFIXUS 11 MM X 360 MM  2. REMOVAL OF BIOMET RETROGRADE NAIL (Right) 3. Open Reduction Internal Fixation (Orif) Wrist Fracture (Right) DISTAL RADIUS DVR PLATE  Antimicrobials: Anti-infectives (From admission, onward)   Start     Dose/Rate Route Frequency Ordered Stop   04/19/20 0731  ceFAZolin (ANCEF) 2-4 GM/100ML-% IVPB    Note to Pharmacy: Henrine Screws   : cabinet override      04/19/20 0731 04/19/20 0848   04/19/20 0730  ceFAZolin (ANCEF) IVPB 2g/100 mL premix     2 g 200 mL/hr over 30 Minutes Intravenous On call to O.R. 04/19/20 0728 04/19/20 0906      Subjective: No current complaints  Objective: Vitals:   04/25/20 0815 04/25/20 1534 04/25/20 2030 04/26/20 0312  BP: 126/68 (!) 141/68 116/71 127/74  Pulse: 87 81 88 82  Resp: 19 19 16 16   Temp: 98.2 F (36.8 C) 99.6 F (37.6 C) 98.3 F (36.8 C) 98.1 F (36.7 C)  TempSrc: Oral Oral Oral Oral  SpO2: 95%  94% 95% 96%  Weight:      Height:        Intake/Output Summary (Last 24 hours) at 04/26/2020 0943 Last data filed at 04/26/2020 0500 Gross per 24 hour  Intake 360 ml  Output 2300 ml  Net -1940 ml   Filed Weights   04/17/20 1424 04/19/20 0654  Weight: 57.2 kg 57.2 kg    Examination:  General: Appearance:    Well developed, well nourished female in no acute distress     Lungs:     Clear to auscultation bilaterally, respirations unlabored  Heart:    Normal heart rate. Normal rhythm. No murmurs, rubs, or gallops.   MS:   Right arm in splint  Neurologic:   Awake, alert, oriented x  3. No apparent focal neurological           defect.     Data Reviewed: I have personally reviewed following labs and imaging studies  CBC: Recent Labs  Lab 04/22/20 0308 04/23/20 0427 04/24/20 0241 04/25/20 0442 04/26/20 0454  WBC 8.5 8.1 9.0 8.0 7.9  NEUTROABS 5.4 4.9 5.5 4.6 5.2  HGB 7.4* 7.6* 7.8* 7.8* 7.9*  HCT 22.8* 23.6* 24.3* 24.2* 25.5*  MCV 102.2* 102.6* 103.0* 103.9* 102.8*  PLT 173 213 274 320 [id number]   Basic Metabolic Panel: Recent Labs  Lab 04/20/20 0652 04/21/20 0510 04/22/20 0308 04/23/20 0427 04/26/20 0454  NA 135 136 136 138 139  K 3.8 3.7 3.6 3.5 3.4*  CL 101 100 102 102 101  CO2 27 29 30  32 29  GLUCOSE 101* 104* 102* 99 102*  BUN 11 15 13 10 10   CREATININE 0.70 0.66 0.62 0.58 0.66  CALCIUM 9.2 9.2 8.8* 9.0 9.2   GFR: Estimated Creatinine Clearance: 39.9 mL/min (by C-G formula based on SCr of 0.66 mg/dL). Liver Function Tests: Recent Labs  Lab 04/26/20 0454  AST 31  ALT 30  ALKPHOS 68  BILITOT 1.0  PROT 5.3*  ALBUMIN 2.6*   No results for input(s): LIPASE, AMYLASE in the last 168 hours. No results for input(s): AMMONIA in the last 168 hours. Coagulation Profile: No results for input(s): INR, PROTIME in the last 168 hours. Cardiac Enzymes: No results for input(s): CKTOTAL, CKMB, CKMBINDEX, TROPONINI in the last 168 hours. BNP (last 3 results) No results for input(s): PROBNP in the last 8760 hours. HbA1C: No results for input(s): HGBA1C in the last 72 hours. CBG: No results for input(s): GLUCAP in the last 168 hours. Lipid Profile: No results for input(s): CHOL, HDL, LDLCALC, TRIG, CHOLHDL, LDLDIRECT in the last 72 hours. Thyroid Function Tests: No results for input(s): TSH, T4TOTAL, FREET4, T3FREE, THYROIDAB in the last 72 hours. Anemia Panel: No results for input(s): VITAMINB12, FOLATE, FERRITIN, TIBC, IRON, RETICCTPCT in the last 72 hours. Sepsis Labs: No results for input(s): PROCALCITON, LATICACIDVEN in the last 168  hours.  Recent Results (from the past 240 hour(s))  Respiratory Panel by RT PCR (Flu A&B, Covid) - Nasopharyngeal Swab     Status: None   Collection Time: 04/17/20  4:48 PM   Specimen: Nasopharyngeal Swab  Result Value Ref Range Status   SARS Coronavirus 2 by RT PCR NEGATIVE NEGATIVE Final    Comment: (NOTE) SARS-CoV-2 target nucleic acids are NOT DETECTED. The SARS-CoV-2 RNA is generally detectable in upper respiratoy specimens during the acute phase of infection. The lowest concentration of SARS-CoV-2 viral copies this assay can detect is 131 copies/mL. A negative result does not preclude SARS-Cov-2 infection and should not  be used as the sole basis for treatment or other patient management decisions. A negative result may occur with  improper specimen collection/handling, submission of specimen other than nasopharyngeal swab, presence of viral mutation(s) within the areas targeted by this assay, and inadequate number of viral copies (<131 copies/mL). A negative result must be combined with clinical observations, patient history, and epidemiological information. The expected result is Negative. Fact Sheet for Patients:  PinkCheek.be Fact Sheet for Healthcare Providers:  GravelBags.it This test is not yet ap proved or cleared by the [place] FDA and  has been authorized for detection and/or diagnosis of SARS-CoV-2 by FDA under an Emergency Use Authorization (EUA). This EUA will remain  in effect (meaning this test can be used) for the duration of the COVID-19 declaration under Section 564(b)(1) of the Act, 21 U.S.C. section 360bbb-3(b)(1), unless the authorization is terminated or revoked sooner.    Influenza A by PCR NEGATIVE NEGATIVE Final   Influenza B by PCR NEGATIVE NEGATIVE Final    Comment: (NOTE) The Xpert Xpress SARS-CoV-2/FLU/RSV assay is intended as an aid in  the diagnosis of influenza from Nasopharyngeal  swab specimens and  should not be used as a sole basis for treatment. Nasal washings and  aspirates are unacceptable for Xpert Xpress SARS-CoV-2/FLU/RSV  testing. Fact Sheet for Patients: PinkCheek.be Fact Sheet for Healthcare Providers: GravelBags.it This test is not yet approved or cleared by the [place] FDA and  has been authorized for detection and/or diagnosis of SARS-CoV-2 by  FDA under an Emergency Use Authorization (EUA). This EUA will remain  in effect (meaning this test can be used) for the duration of the  Covid-19 declaration under Section 564(b)(1) of the Act, 21  U.S.C. section 360bbb-3(b)(1), unless the authorization is  terminated or revoked. Performed at [facility], [place] [address], [place], [place] [address]   Surgical PCR screen     Status: None   Collection Time: 04/17/20 10:31 PM   Specimen: Nasal Mucosa; Nasal Swab  Result Value Ref Range Status   MRSA, PCR NEGATIVE NEGATIVE Final   Staphylococcus aureus NEGATIVE NEGATIVE Final    Comment: (NOTE) The Xpert SA Assay (FDA approved for NASAL specimens in patients 26 years of age and older), is one component of a comprehensive surveillance program. It is not intended to diagnose infection nor to guide or monitor treatment. Performed at [facility], [place] [address] [place], [place] [address]      Radiology Studies: No results found.  Scheduled Meds: . acetaminophen  500 mg Oral Q12H  . amLODipine  5 mg Oral Daily  . vitamin C  500 mg Oral Daily  . bacitracin   Topical BID  . docusate sodium  100 mg Oral BID  . enoxaparin (LOVENOX) injection  40 mg Subcutaneous Q24H  . ferrous sulfate  325 mg Oral Q breakfast  . multivitamin with minerals  1 tablet Oral Daily  . pantoprazole  40 mg Oral Daily  . polyethylene glycol  17 g Oral Daily  . simvastatin  20 mg Oral QHS   Continuous Infusions:   LOS: 9 days   Geradine Girt,  DO [facility] Pager On Amion  If 7PM-7AM, please contact night-coverage 04/26/2020, 9:43 AM

## 2020-04-26 NOTE — Progress Notes (Signed)
Occupational Therapy Treatment Patient Details Name: Savannah Coleman MRN: [id number] DOB: 29-Aug-1931 Today's Date: 04/26/2020    History of present illness Savannah Coleman is a 86 y.o. female with medical history significant of HTN, prior R femur fx in 2015. Pt presents to ED after she slipped and fell in shower. S/p right hip IM nail and right wrist ORIF.   OT comments  Pt continues to progress toward prior level of functioning. She required minA for bed mobility and minA for functional mobility at RW level. Pt required setupA for grooming task while seated. She reports decreased fine motor strength  but remains within functional level. Pt will continue to benefit from skilled OT services to maximize safety and independence with ADL/IADL and functional mobility. Will continue to follow acutely and progress as tolerated.    Follow Up Recommendations  Other (comment)(Inpatient Rehab Facility ([facility]))    Equipment Recommendations  3 in 1 bedside commode    Recommendations for Other Services      Precautions / Restrictions Precautions Precautions: Fall Precaution Comments: pt fell in shower  Restrictions Weight Bearing Restrictions: Yes RUE Weight Bearing: Non weight bearing RLE Weight Bearing: Weight bearing as tolerated Other Position/Activity Restrictions: weight bearing through R elbow okay       Mobility Bed Mobility Overal bed mobility: Needs Assistance Bed Mobility: Supine to Sit     Supine to sit: Min assist     General bed mobility comments: minA to bring RLE to EOB   Transfers Overall transfer level: Needs assistance Equipment used: Right platform walker;Left platform walker Transfers: Sit to/from Stand Sit to Stand: Min assist         General transfer comment: pt reliant on pulling up on RW, required assist to steady RW and minA for powerup into standing    Balance Overall balance assessment: Needs assistance Sitting-balance support: No upper  extremity supported;Feet supported Sitting balance-Leahy Scale: Fair     Standing balance support: Bilateral upper extremity supported;During functional activity Standing balance-Leahy Scale: Poor Standing balance comment: minA for stability with mobility                           ADL either performed or assessed with clinical judgement   ADL Overall ADL's : Needs assistance/impaired     Grooming: Set up;Sitting;Oral care   Upper Body Bathing: Set up;Sitting   Lower Body Bathing: Minimal assistance;Sit to/from stand   Upper Body Dressing : Minimal assistance;Sitting   Lower Body Dressing: Minimal assistance;Sit to/from stand   Toilet Transfer: Minimal assistance;Ambulation(bilateral platform walker) Toilet Transfer Details (indicate cue type and reason): ambulating in room         Functional mobility during ADLs: Minimal assistance;Rolling walker(bilateral platform walker) General ADL Comments: pt reliant on pulling up on RW due to fear of increased pain with attempts to push up from bed with LUE     Vision       Perception     Praxis      Cognition Arousal/Alertness: Awake/alert Behavior During Therapy: WFL for tasks assessed/performed Overall Cognitive Status: Within Functional Limits for tasks assessed                                 General Comments: Pt easily distracted and required cues to progress mobility and focus on task at hand.        Exercises Exercises: Other exercises  Other Exercises Other Exercises: educated pt on importance of elevating BUE and mobilizing Right digits, elbow and shoulder and mobilizing LUE   Shoulder Instructions       General Comments pt on RA, VSS    Pertinent Vitals/ Pain       Pain Assessment: No/denies pain Pain Intervention(s): Monitored during session  Home Living                                          Prior Functioning/Environment              Frequency   Min 2X/week        Progress Toward Goals  OT Goals(current goals can now be found in the care plan section)  Progress towards OT goals: Progressing toward goals  Acute Rehab OT Goals Patient Stated Goal: to get stronger OT Goal Formulation: With patient Time For Goal Achievement: 05/04/20 Potential to Achieve Goals: Good ADL Goals Pt Will Perform Grooming: with modified independence;standing Pt Will Perform Lower Body Dressing: sit to/from stand;with min guard assist Pt Will Transfer to Toilet: ambulating;with min assist Additional ADL Goal #1: Pt will progress to EOB with minguard assist in preparation for ADL.  Plan Discharge plan needs to be updated;Discharge plan remains appropriate    Co-evaluation                 AM-PAC OT "6 Clicks" Daily Activity     Outcome Measure   Help from another person eating meals?: A Little Help from another person taking care of personal grooming?: A Little Help from another person toileting, which includes using toliet, bedpan, or urinal?: A Little Help from another person bathing (including washing, rinsing, drying)?: A Little Help from another person to put on and taking off regular upper body clothing?: A Little Help from another person to put on and taking off regular lower body clothing?: A Little 6 Click Score: 18    End of Session Equipment Utilized During Treatment: Gait belt;Rolling walker  OT Visit Diagnosis: Unsteadiness on feet (R26.81);Other abnormalities of gait and mobility (R26.89);Muscle weakness (generalized) (M62.81);Pain Pain - Right/Left: Right Pain - part of body: Leg;Hand   Activity Tolerance Patient tolerated treatment well   Patient Left in chair;with chair alarm set;with call bell/phone within reach   Nurse Communication Mobility status        Time: [id number] OT Time Calculation (min): 23 min  Charges: OT General Charges $OT Visit: 1 Visit OT Treatments $Self Care/Home Management : 23-37  mins  Helene Kelp OTR/L [place] Office: [place] 04/26/2020, 1:16 PM

## 2020-04-26 NOTE — Op Note (Signed)
NAMEVIANNI, Savannah Coleman MEDICAL RECORD [id number] ACCOUNT [id number] DATE OF BIRTH:05-26-1931 FACILITY: [facility] LOCATION: [id number] PHYSICIAN:Markos Theil H. Dian Laprade, MD  OPERATIVE REPORT  DATE OF PROCEDURE:  04/19/2020  PREOPERATIVE DIAGNOSES:   1.  Right hip 3-part intertrochanteric fracture. 2.  Retained right femoral nail. 3.  Right wrist distal radius Colles fracture.  POSTOPERATIVE DIAGNOSES: 1.  Right hip 3-part intertrochanteric fracture. 2.  Retained right femoral nail. 3.  Right wrist distal radius Colles fracture.  PROCEDURES:   1.  Intramedullary nailing of the right hip using an Affixus Biomet nail. 2.  Removal of deep implant, right femur, in order to place the right hip nail. 3.  Open reduction internal fixation of right distal radius using the Biomet DVR plate.  SURGEON:  Altamese [place], MD  ASSISTANT:  Ainsley Spinner, PA-C  ANESTHESIA:  General.  COMPLICATIONS:  None.  TOURNIQUET:  None.  DISPOSITION:  To PACU.  CONDITION:  Stable.  INDICATIONS FOR PROCEDURE:  The patient is a very pleasant 86 year old female with past medical history notable for right total knee arthroplasty, above which she sustained a supracondylar femur fracture several years ago, which was treated with  intramedullary nailing.  She went on to unite this fracture and recently fell sustaining an intertrochanteric fracture above the current implant.  I did discuss with her the risks and benefits of surgery.  For this injury, the requirement is to remove  the nail as well as recommended proceeding with fixation of the right distal radius as it was in an unacceptable position with 20 degrees loss of tilt and some shortening, but that we could proceed with closed treatment, but that would lock up her elbow  motion and in general, retard her overall progression with PT as opposed to promoting it, and with most of the risk being involved with anesthesia itself.  She strongly wished to include repair of the  distal radius as well and provided consent to  proceed.  BRIEF SUMMARY OF PROCEDURE:  The patient was taken to the operating room where general anesthesia was induced.  Her right hip was prepped and draped in usual sterile fashion.  We did a chlorhexidine wash, Betadine scrub and paint, remade the old distal  incisions for the locking bolts and the proximal locking bolts anteriorly and thigh, and then the arthrotomy of the knee joint going medial to the patellar tendon even though the incision seemed to be somewhat lateral.  We were able to withdraw the nail  without complication or injury to the total knee replacement.  C-arm was then brought proximally where a 3 cm incision was made proximal to the greater trochanter.  The curved cannulated awl advanced just medial to the tip of the trochlea and then the  threaded guidewire in the proximal femur.  The opening reamer was used by my assistant to pull traction.  He had to pull traction throughout the case as, again, we had remove the nail and this was not amenable to traction with a different type of bed as  the knee had to be flexed and the nail removed.  Once we got to the appropriate location, sequential reaming was performed and then the Affixus nail placed to the appropriate depth to allow for lag screw fixation into the center of the femoral head.   This was achieved, the compression device engaged.  My assistant released the traction and the hip was compressed.  We checked this on AP and lateral views throughout.  A single distal lock was  placed given the stability of the fracture proximally.  All  wounds were irrigated thoroughly, closed in standard layered fashion.  Again, Ainsley Spinner, PA-C, was present and assisting throughout necessary for the safe and effective completion of the case.  We then turned our attention to the right distal radius.  Here, the upper extremity was prepped and draped in usual sterile fashion.  Again, chlorhexidine wash  Betadine scrub and paint.  A timeout was held.  I made a standard volar approach to the distal  radius, retracting the FCR tendon with the artery radially and then continuing deep through the FCR tendon sheath, retracting the tendons ulnarly and then releasing the pronator along its radial edge sweeping this ulnarly.  Hohmann retractors were  carefully placed.  A towel bump was used to facilitate reduction and then the plate was left off the metaphysis while establishing the appropriate distance for the DVR plate with respect to the joint.  This was then engaged and checked for position on AP  and lateral views followed by peg fixation and then standard fixation into the metaphysis.  Final x-ray showed restoration of appropriate tilt as well as maintenance of length and alignment.  Of course, the patient had some preexisting ulnar positive  appearance to her wrist and this was consistent and symmetric with the contralateral side.  Again, Ainsley Spinner, PA-C, was present and assisting throughout.  PROGNOSIS:  The patient will be weightbearing as tolerated using a platform walker.  She will have unrestricted range of motion of the digits and is in a volar splint for the wrist.  We will plan to see her back in the office in 10 days for removal of  sutures.  She will resume pharmacologic DVT prophylaxis.  JN/NUANCE  D:04/25/2020 T:04/26/2020 JOB:[id number]

## 2020-04-26 NOTE — Progress Notes (Signed)
Physical Therapy Treatment Patient Details Name: Savannah Coleman MRN: [id number] DOB: 1931-04-30 Today's Date: 04/26/2020    History of Present Illness Savannah Coleman is a 86 y.o. female with medical history significant of HTN, prior R femur fx in 2015. Pt presents to ED after she slipped and fell in shower. S/p right hip IM nail and right wrist ORIF.    PT Comments    Pt supine in bed on arrival.  She reports she has had a busy day including multiple bathroom trips and OT session.  She remains motivated to get up despite fatigue and able to progress gt distance.  Pt continues to be an excellent candidate for IRF to improve strength and function before returning home.  Plan for continued rehab in acute setting until transfer to post acute rehab.      Follow Up Recommendations  Other (comment)(IRF)     Equipment Recommendations  Rolling walker with 5" wheels    Recommendations for Other Services       Precautions / Restrictions Precautions Precautions: Fall Precaution Comments: pt fell in shower  Restrictions Weight Bearing Restrictions: Yes RUE Weight Bearing: Non weight bearing RLE Weight Bearing: Weight bearing as tolerated Other Position/Activity Restrictions: weight bearing through R elbow okay    Mobility  Bed Mobility Overal bed mobility: Needs Assistance Bed Mobility: Supine to Sit;Sit to Supine Rolling: Min assist   Supine to sit: Min assist     General bed mobility comments: Cues for avoidance of pushing with R hand.  Pt required assistance to advance RLE to edge of bed and scoot forward with use of bed pad.  Transfers Overall transfer level: Needs assistance Equipment used: Right platform walker;Left platform walker Transfers: Sit to/from Stand Sit to Stand: Min assist         General transfer comment: Min assistance to boost into standing.  Once in standing able to place B forearms on platform  Ambulation/Gait Ambulation/Gait assistance: Min  assist Gait Distance (Feet): 60 Feet Assistive device: (B PFRW) Gait Pattern/deviations: Decreased stride length;Decreased step length - left;Decreased stance time - right;Step-through pattern     General Gait Details: Pt continues to required cues for upper trunk control and scapular retraction.  Pt progressing well with step through pattern and improved activity tolerance.   Stairs             Wheelchair Mobility    Modified Rankin (Stroke Patients Only)       Balance Overall balance assessment: Needs assistance Sitting-balance support: No upper extremity supported;Feet supported Sitting balance-Leahy Scale: Fair     Standing balance support: Bilateral upper extremity supported;During functional activity Standing balance-Leahy Scale: Poor Standing balance comment: minA for stability with mobility                            Cognition Arousal/Alertness: Awake/alert Behavior During Therapy: WFL for tasks assessed/performed Overall Cognitive Status: Within Functional Limits for tasks assessed                                 General Comments: Pt is very laquacious and requires cues to redirect.      Exercises Other Exercises Other Exercises: educated pt on importance of elevating BUE and mobilizing Right digits, elbow and shoulder and mobilizing LUE    General Comments General comments (skin integrity, edema, etc.): pt on RA, VSS  Pertinent Vitals/Pain Pain Assessment: Faces Faces Pain Scale: Hurts little more Pain Location: L wrist and RLE, although reports L wrist is feeling better today Pain Descriptors / Indicators: Grimacing;Guarding;Sore Pain Intervention(s): Monitored during session;Repositioned    Home Living                      Prior Function            PT Goals (current goals can now be found in the care plan section) Acute Rehab PT Goals Patient Stated Goal: to get stronger Potential to Achieve Goals:  Good Progress towards PT goals: Progressing toward goals    Frequency    Min 4X/week      PT Plan Current plan remains appropriate    Co-evaluation              AM-PAC PT "6 Clicks" Mobility   Outcome Measure  Help needed turning from your back to your side while in a flat bed without using bedrails?: A Little Help needed moving from lying on your back to sitting on the side of a flat bed without using bedrails?: A Little Help needed moving to and from a bed to a chair (including a wheelchair)?: A Little Help needed standing up from a chair using your arms (e.g., wheelchair or bedside chair)?: A Little Help needed to walk in hospital room?: A Little Help needed climbing 3-5 steps with a railing? : A Little 6 Click Score: 18    End of Session Equipment Utilized During Treatment: Gait belt Activity Tolerance: Patient tolerated treatment well Patient left: with chair alarm set;with family/visitor present;in bed;with bed alarm set Nurse Communication: Mobility status(need for purwick placement per patient request, she does not need this as she is mobilizing well enough to toilet with assistance.) PT Visit Diagnosis: Unsteadiness on feet (R26.81);History of falling (Z91.81);Difficulty in walking, not elsewhere classified (R26.2);Pain Pain - Right/Left: Right Pain - part of body: Arm;Hip     Time: 1540-1601 PT Time Calculation (min) (ACUTE ONLY): 21 min  Charges:  $Gait Training: 8-22 mins                     Erasmo Leventhal , PTA Acute Rehabilitation Services Pager [phone] Office [place] [facility] 04/26/2020, 4:44 PM

## 2020-04-26 NOTE — TOC Progression Note (Addendum)
Received a phone call from Leonidas Romberg, who advised that pt's authorization is pending and response should be on Wed. This call occurred based on daughters concern to insurance company that her mother was being pushed to discharge. CM updated primary RN and Dr. Eliseo Squires that insurance Josem Kaufmann is still pending.  CM team will continue to follow.

## 2020-04-27 DIAGNOSIS — S62101D Fracture of unspecified carpal bone, right wrist, subsequent encounter for fracture with routine healing: Secondary | ICD-10-CM

## 2020-04-27 LAB — CBC WITH DIFFERENTIAL/PLATELET
Abs Immature Granulocytes: 0.08 10*3/uL — ABNORMAL HIGH (ref 0.00–0.07)
Basophils Absolute: 0.1 10*3/uL (ref 0.0–0.1)
Basophils Relative: 1 %
Eosinophils Absolute: 0.4 10*3/uL (ref 0.0–0.5)
Eosinophils Relative: 4 %
HCT: 26.7 % — ABNORMAL LOW (ref 36.0–46.0)
Hemoglobin: 8.4 g/dL — ABNORMAL LOW (ref 12.0–15.0)
Immature Granulocytes: 1 %
Lymphocytes Relative: 18 %
Lymphs Abs: 1.7 10*3/uL (ref 0.7–4.0)
MCH: 32.7 pg (ref 26.0–34.0)
MCHC: 31.5 g/dL (ref 30.0–36.0)
MCV: 103.9 fL — ABNORMAL HIGH (ref 80.0–100.0)
Monocytes Absolute: 1.3 10*3/uL — ABNORMAL HIGH (ref 0.1–1.0)
Monocytes Relative: 13 %
Neutro Abs: 6 10*3/uL (ref 1.7–7.7)
Neutrophils Relative %: 63 %
Platelets: 412 10*3/uL — ABNORMAL HIGH (ref 150–400)
RBC: 2.57 MIL/uL — ABNORMAL LOW (ref 3.87–5.11)
RDW: 15.1 % (ref 11.5–15.5)
WBC: 9.4 10*3/uL (ref 4.0–10.5)
nRBC: 0 % (ref 0.0–0.2)

## 2020-04-27 NOTE — Plan of Care (Signed)
  Problem: Activity: Goal: Ability to ambulate and perform ADLs will improve Outcome: Progressing   Problem: Pain Managment: Goal: General experience of comfort will improve Outcome: Progressing   Problem: Skin Integrity: Goal: Risk for impaired skin integrity will decrease Outcome: Progressing

## 2020-04-27 NOTE — Progress Notes (Signed)
Updated PT/OT notes faxed to [facility]/ Beckie Salts @ [phone] per request. Whitman Hero RN,BSN,CM

## 2020-04-27 NOTE — Progress Notes (Signed)
PROGRESS NOTE    Savannah Coleman  [id number] DOB: 1931/09/28 DOA: 04/17/2020 PCP: Jonathon Resides, MD    Brief Narrative:  86 y.o.femalewith medical history significant ofHTN, prior R femur fx in 2015. Pt presents to ED after she slipped and fell in shower. Pain to both wrists and R femur. Unable to bear wait, pain worse with movement. Couldn't bend or walk R leg. In the ED, noted a R wrist fx, and a much more complicated R perihardware lesser trochanter fracture of femur.  S/P surgery on wrist and hip.   Assessment & Plan:   Principal Problem:   Intertrochanteric fracture of right hip [facility]) Active Problems:   Hypertension   Closed fracture of right distal radius   Right periprosthetic intertrochanteric hip fracture s/p surgery on 04/19/2020 S/p mechanical fall X-ray with avulsion fx of the lesser trochanter of the R femur at the level of the proximal threaded screw CT head/cervical spine showed no acute fracture or intracranial abnormality Orthopedics : s/p surgery on 5/3 Pain management, DVT PPx as per orthopedics PT/OT following, recs for inpt rehab facility noted, TOC is following  Right wrist fracture s/p ORIF on 04/19/2020 X-ray showed mildly displaced transverse fracture of the right radial head Orthopedics on board, cont to follow recs PT/OT following  Leukocytosis Likely reactive secondary to above fractures Labs reviewed. WBC had normalized  Macrocytic anemia/post op anemia Anemia panel unremarkable No evidence of acute bleed at this time Slight trended down to 7.4 Now on PO iron and vit C Plan to transfuse if hgb drops to <7.0  Hypertension Continue home amlodipine BP stable and controlled at this time  Hyperlipidemia Continue home statin as pt tolerates  GERD Continue PPI as pt tolerates  Hypokalemia -repleted  Constipation -bowel regimen  DVT prophylaxis: Lovenox subQ Code Status: Full Family Communication: Pt in room, family not  at bedside  Status is: Inpatient  Remains inpatient appropriate because:Unsafe d/c plan, awaiting rehab bed   Dispo: The patient is from: Home              Anticipated d/c is to: inpatient rehab facility              Anticipated d/c date is: when bed available               Patient currently is medically stable to d/c.   Consultants:   Orthopedic Surgery  Procedures:  1. RIGHT HIP ANTEGRADE INTRAMEDULLARY NAILING WITH BIOMET AFFIXUS 11 MM X 360 MM  2. REMOVAL OF BIOMET RETROGRADE NAIL (Right) 3. Open Reduction Internal Fixation (Orif) Wrist Fracture (Right) DISTAL RADIUS DVR PLATE  Antimicrobials: Anti-infectives (From admission, onward)   Start     Dose/Rate Route Frequency Ordered Stop   04/19/20 0731  ceFAZolin (ANCEF) 2-4 GM/100ML-% IVPB    Note to Pharmacy: Henrine Screws   : cabinet override      04/19/20 0731 04/19/20 0848   04/19/20 0730  ceFAZolin (ANCEF) IVPB 2g/100 mL premix     2 g 200 mL/hr over 30 Minutes Intravenous On call to O.R. 04/19/20 [id number] 04/19/20 0906      Subjective: In chair- IV Had to  Be removed last PM on left arm  Objective: Vitals:   04/26/20 1249 04/26/20 2045 04/27/20 0333 04/27/20 0700  BP: (!) 122/47 127/72 116/71 (!) 146/75  Pulse: 79 83 78 77  Resp: 16 15 16    Temp: 98.2 F (36.8 C) 98.6 F (37 C) 98.2 F (36.8 C) 98.2 F (  36.8 C)  TempSrc: Oral Oral Oral Oral  SpO2: 98% 93% 100% 98%  Weight:      Height:        Intake/Output Summary (Last 24 hours) at 04/27/2020 1047 Last data filed at 04/27/2020 0426 Gross per 24 hour  Intake 240 ml  Output 400 ml  Net -160 ml   Filed Weights   04/17/20 1424 04/19/20 0654  Weight: 57.2 kg 57.2 kg    Examination:  General: Appearance:    Well developed, well nourished female in no acute distress     Lungs:     Clear to auscultation bilaterally, respirations unlabored  Heart:    Normal heart rate. Normal rhythm. No murmurs, rubs, or gallops.   MS:   Right arm in splint    Neurologic:   Awake, alert, oriented x 3. No apparent focal neurological           defect.    Data Reviewed: I have personally reviewed following labs and imaging studies  CBC: Recent Labs  Lab 04/23/20 0427 04/24/20 0241 04/25/20 0442 04/26/20 0454 04/27/20 0547  WBC 8.1 9.0 8.0 7.9 9.4  NEUTROABS 4.9 5.5 4.6 5.2 6.0  HGB 7.6* 7.8* 7.8* 7.9* 8.4*  HCT 23.6* 24.3* 24.2* 25.5* 26.7*  MCV 102.6* 103.0* 103.9* 102.8* 103.9*  PLT 213 274 320 381 [id number]*   Basic Metabolic Panel: Recent Labs  Lab 04/21/20 0510 04/22/20 0308 04/23/20 0427 04/26/20 0454  NA 136 136 138 139  K 3.7 3.6 3.5 3.4*  CL 100 102 102 101  CO2 29 30 32 29  GLUCOSE 104* 102* 99 102*  BUN 15 13 10 10   CREATININE 0.66 0.62 0.58 0.66  CALCIUM 9.2 8.8* 9.0 9.2   GFR: Estimated Creatinine Clearance: 39.9 mL/min (by C-G formula based on SCr of 0.66 mg/dL). Liver Function Tests: Recent Labs  Lab 04/26/20 0454  AST 31  ALT 30  ALKPHOS 68  BILITOT 1.0  PROT 5.3*  ALBUMIN 2.6*   No results for input(s): LIPASE, AMYLASE in the last 168 hours. No results for input(s): AMMONIA in the last 168 hours. Coagulation Profile: No results for input(s): INR, PROTIME in the last 168 hours. Cardiac Enzymes: No results for input(s): CKTOTAL, CKMB, CKMBINDEX, TROPONINI in the last 168 hours. BNP (last 3 results) No results for input(s): PROBNP in the last 8760 hours. HbA1C: No results for input(s): HGBA1C in the last 72 hours. CBG: No results for input(s): GLUCAP in the last 168 hours. Lipid Profile: No results for input(s): CHOL, HDL, LDLCALC, TRIG, CHOLHDL, LDLDIRECT in the last 72 hours. Thyroid Function Tests: No results for input(s): TSH, T4TOTAL, FREET4, T3FREE, THYROIDAB in the last 72 hours. Anemia Panel: No results for input(s): VITAMINB12, FOLATE, FERRITIN, TIBC, IRON, RETICCTPCT in the last 72 hours. Sepsis Labs: No results for input(s): PROCALCITON, LATICACIDVEN in the last 168 hours.  Recent  Results (from the past 240 hour(s))  Respiratory Panel by RT PCR (Flu A&B, Covid) - Nasopharyngeal Swab     Status: None   Collection Time: 04/17/20  4:48 PM   Specimen: Nasopharyngeal Swab  Result Value Ref Range Status   SARS Coronavirus 2 by RT PCR NEGATIVE NEGATIVE Final    Comment: (NOTE) SARS-CoV-2 target nucleic acids are NOT DETECTED. The SARS-CoV-2 RNA is generally detectable in upper respiratoy specimens during the acute phase of infection. The lowest concentration of SARS-CoV-2 viral copies this assay can detect is 131 copies/mL. A negative result does not preclude SARS-Cov-2 infection and should  not be used as the sole basis for treatment or other patient management decisions. A negative result may occur with  improper specimen collection/handling, submission of specimen other than nasopharyngeal swab, presence of viral mutation(s) within the areas targeted by this assay, and inadequate number of viral copies (<131 copies/mL). A negative result must be combined with clinical observations, patient history, and epidemiological information. The expected result is Negative. Fact Sheet for Patients:  PinkCheek.be Fact Sheet for Healthcare Providers:  GravelBags.it This test is not yet ap proved or cleared by the [place] FDA and  has been authorized for detection and/or diagnosis of SARS-CoV-2 by FDA under an Emergency Use Authorization (EUA). This EUA will remain  in effect (meaning this test can be used) for the duration of the COVID-19 declaration under Section 564(b)(1) of the Act, 21 U.S.C. section 360bbb-3(b)(1), unless the authorization is terminated or revoked sooner.    Influenza A by PCR NEGATIVE NEGATIVE Final   Influenza B by PCR NEGATIVE NEGATIVE Final    Comment: (NOTE) The Xpert Xpress SARS-CoV-2/FLU/RSV assay is intended as an aid in  the diagnosis of influenza from Nasopharyngeal swab specimens  and  should not be used as a sole basis for treatment. Nasal washings and  aspirates are unacceptable for Xpert Xpress SARS-CoV-2/FLU/RSV  testing. Fact Sheet for Patients: PinkCheek.be Fact Sheet for Healthcare Providers: GravelBags.it This test is not yet approved or cleared by the [place] FDA and  has been authorized for detection and/or diagnosis of SARS-CoV-2 by  FDA under an Emergency Use Authorization (EUA). This EUA will remain  in effect (meaning this test can be used) for the duration of the  Covid-19 declaration under Section 564(b)(1) of the Act, 21  U.S.C. section 360bbb-3(b)(1), unless the authorization is  terminated or revoked. Performed at [facility], [place] [address], [place], [place] [address]   Surgical PCR screen     Status: None   Collection Time: 04/17/20 10:31 PM   Specimen: Nasal Mucosa; Nasal Swab  Result Value Ref Range Status   MRSA, PCR NEGATIVE NEGATIVE Final   Staphylococcus aureus NEGATIVE NEGATIVE Final    Comment: (NOTE) The Xpert SA Assay (FDA approved for NASAL specimens in patients 38 years of age and older), is one component of a comprehensive surveillance program. It is not intended to diagnose infection nor to guide or monitor treatment. Performed at [facility], [place] [address], [place], [place] [address]      Radiology Studies: No results found.  Scheduled Meds: . acetaminophen  500 mg Oral Q12H  . amLODipine  5 mg Oral Daily  . vitamin C  500 mg Oral Daily  . bacitracin   Topical BID  . docusate sodium  100 mg Oral BID  . enoxaparin (LOVENOX) injection  40 mg Subcutaneous Q24H  . ferrous sulfate  325 mg Oral Q breakfast  . multivitamin with minerals  1 tablet Oral Daily  . pantoprazole  40 mg Oral Daily  . polyethylene glycol  17 g Oral Daily  . simvastatin  20 mg Oral QHS   Continuous Infusions:   LOS: 10 days   Geradine Girt, DO [facility] Pager On Amion  If 7PM-7AM, please contact night-coverage 04/27/2020, 10:47 AM

## 2020-04-27 NOTE — Progress Notes (Signed)
Physical Therapy Treatment Patient Details Name: Savannah Coleman MRN: [id number] DOB: 1931/06/06 Today's Date: 04/27/2020    History of Present Illness Savannah Coleman is a 86 y.o. female with medical history significant of HTN, prior R femur fx in 2015. Pt presents to ED after she slipped and fell in shower. S/p right hip IM nail and right wrist ORIF.    PT Comments    Pt making great functional gains this session.  Focused on LE strengthening and progress of gt distance.  She remains reliant on B PFRW due to L wrist pain and weight bearing restriction on RUE.  Plan remains appropriate for aggressive rehab in a IRF.  Plan for f/u as patient remains hospitalized.     Follow Up Recommendations  Other (comment)(IRF)     Equipment Recommendations  Rolling walker with 5" wheels(w/ B PF attachments)    Recommendations for Other Services       Precautions / Restrictions Precautions Precautions: Fall Precaution Comments: pt fell in shower  Restrictions Weight Bearing Restrictions: Yes RUE Weight Bearing: Weight bear through elbow only(otherwise NWB) RLE Weight Bearing: Weight bearing as tolerated Other Position/Activity Restrictions: weight bearing through R elbow okay    Mobility  Bed Mobility Overal bed mobility: Needs Assistance Bed Mobility: Supine to Sit;Sit to Supine Rolling: Min assist   Supine to sit: Min assist     General bed mobility comments: Cues for avoidance of pushing with R hand.  Pt required assistance to advance RLE to edge of bed and scoot forward with use of bed pad.  Pt required assistance to lift RLE back into bed.  Transfers Overall transfer level: Needs assistance Equipment used: Right platform walker;Left platform walker Transfers: Sit to/from Stand Sit to Stand: Mod assist         General transfer comment: Cues for hand placement and forward weight shifting to rise into standing.  Posterior bias.  Ambulation/Gait Ambulation/Gait  assistance: Min assist Gait Distance (Feet): 80 Feet Assistive device: (B PFRW) Gait Pattern/deviations: Decreased stride length;Decreased step length - left;Decreased stance time - right;Step-through pattern     General Gait Details: Pt continues to required cues for upper trunk control and scapular retraction.  Pt progressing well with step through pattern and improved activity tolerance.  Cues for increased weight bearing on R to reduce stress on shoulders.   Stairs             Wheelchair Mobility    Modified Rankin (Stroke Patients Only)       Balance     Sitting balance-Leahy Scale: Fair       Standing balance-Leahy Scale: Poor                              Cognition Arousal/Alertness: Awake/alert Behavior During Therapy: WFL for tasks assessed/performed Overall Cognitive Status: Within Functional Limits for tasks assessed                                        Exercises General Exercises - Lower Extremity Ankle Circles/Pumps: AROM;Both;20 reps;Supine Quad Sets: AROM;Both;10 reps;Supine Heel Slides: AROM;Both;10 reps;Supine;AAROM Hip ABduction/ADduction: AAROM;AROM;Both;10 reps;Supine Straight Leg Raises: AROM;AAROM;Both;10 reps;Supine Other Exercises Other Exercises: B scap retraction x10, B shoulder shrugs x10, cervical horizontal rotation x10 and reverse prayer stretch x 5 min.    General Comments  Pertinent Vitals/Pain Pain Assessment: Faces Faces Pain Scale: Hurts little more Pain Location: L wrist and RLE, although reports L wrist is feeling better today Pain Descriptors / Indicators: Grimacing;Guarding;Sore Pain Intervention(s): Monitored during session;Repositioned    Home Living                      Prior Function            PT Goals (current goals can now be found in the care plan section) Acute Rehab PT Goals Potential to Achieve Goals: Good Progress towards PT goals: Progressing toward  goals    Frequency    Min 4X/week      PT Plan Current plan remains appropriate    Co-evaluation              AM-PAC PT "6 Clicks" Mobility   Outcome Measure  Help needed turning from your back to your side while in a flat bed without using bedrails?: A Little Help needed moving from lying on your back to sitting on the side of a flat bed without using bedrails?: A Little Help needed moving to and from a bed to a chair (including a wheelchair)?: A Little Help needed standing up from a chair using your arms (e.g., wheelchair or bedside chair)?: A Little Help needed to walk in hospital room?: A Little Help needed climbing 3-5 steps with a railing? : A Little 6 Click Score: 18    End of Session Equipment Utilized During Treatment: Gait belt Activity Tolerance: Patient tolerated treatment well Patient left: with family/visitor present;in bed;with bed alarm set Nurse Communication: Mobility status(purewick placement) PT Visit Diagnosis: Unsteadiness on feet (R26.81);History of falling (Z91.81);Difficulty in walking, not elsewhere classified (R26.2);Pain Pain - Right/Left: Right Pain - part of body: Arm;Hip     Time: [id number] PT Time Calculation (min) (ACUTE ONLY): 39 min  Charges:  $Gait Training: 8-22 mins $Therapeutic Exercise: 8-22 mins $Therapeutic Activity: 8-22 mins                     Erasmo Leventhal , PTA Acute Rehabilitation Services Pager [phone] Office [phone]     Niclas Markell [name] 04/27/2020, 3:39 PM

## 2020-04-28 NOTE — Progress Notes (Signed)
Physical Therapy Treatment Patient Details Name: Savannah Coleman MRN: [id number] DOB: 15-Aug-1931 Today's Date: 04/28/2020    History of Present Illness Savannah Coleman is a 86 y.o. female with medical history significant of HTN, prior R femur fx in 2015. Pt presents to ED after she slipped and fell in shower. S/p right hip IM nail and right wrist ORIF.    PT Comments    Pt with reduced activity tolerance this session due to generalized fatigue and L wrist/hand pain. Pt reports being worn out from visitors and therapy session yesterday, resulting in reduced ambulation tolerance this afternoon. Pt continues to require physical assistance to perform transfers due to strength deficits, and requires frequent cues to maintain WB status of elbow WB only through RUE. Pt will continue to benefit from acute PT POC to improve activity tolerance and reduce falls risk.   Follow Up Recommendations  CIR     Equipment Recommendations  Rolling walker with 5" wheels(with bilateral platform attachment)    Recommendations for Other Services       Precautions / Restrictions Precautions Precautions: Fall Restrictions Weight Bearing Restrictions: Yes RUE Weight Bearing: Weight bear through elbow only RLE Weight Bearing: Weight bearing as tolerated    Mobility  Bed Mobility Overal bed mobility: Needs Assistance Bed Mobility: Sit to Supine       Sit to supine: Min assist   General bed mobility comments: RLE management  Transfers Overall transfer level: Needs assistance Equipment used: Bilateral platform walker Transfers: Sit to/from Stand Sit to Stand: Mod assist         General transfer comment: PT provides physical assistance along with cues for foot placement, hand placement, rocking for momentum, and forward trunk lean  Ambulation/Gait Ambulation/Gait assistance: Min guard Gait Distance (Feet): 50 Feet Assistive device: Bilateral platform walker Gait Pattern/deviations: Decreased  stride length Gait velocity: reduced Gait velocity interpretation: <1.8 ft/sec, indicate of risk for recurrent falls General Gait Details: pt with short step through gait   Stairs             Wheelchair Mobility    Modified Rankin (Stroke Patients Only)       Balance Overall balance assessment: Needs assistance Sitting-balance support: No upper extremity supported;Feet supported Sitting balance-Leahy Scale: Fair Sitting balance - Comments: supervision at edge of bed for static sitting   Standing balance support: Bilateral upper extremity supported Standing balance-Leahy Scale: Fair Standing balance comment: minG with BUE support of bilateral platform walker                            Cognition Arousal/Alertness: Awake/alert Behavior During Therapy: WFL for tasks assessed/performed Overall Cognitive Status: Within Functional Limits for tasks assessed                                        Exercises General Exercises - Lower Extremity Ankle Circles/Pumps: AROM;Both;10 reps Quad Sets: AROM;Right;5 reps Gluteal Sets: AROM;Both;5 reps Heel Slides: AROM;Right;5 reps Hip ABduction/ADduction: AROM;Right;5 reps    General Comments General comments (skin integrity, edema, etc.): VSS on RA      Pertinent Vitals/Pain Pain Assessment: Faces Faces Pain Scale: Hurts even more Pain Location: L wrist Pain Descriptors / Indicators: Grimacing Pain Intervention(s): Monitored during session    Home Living  Prior Function            PT Goals (current goals can now be found in the care plan section) Acute Rehab PT Goals Patient Stated Goal: to get stronger Progress towards PT goals: Not progressing toward goals - comment(limited by L wrist/hand pain)    Frequency    Min 4X/week      PT Plan Current plan remains appropriate    Co-evaluation              AM-PAC PT "6 Clicks" Mobility   Outcome  Measure  Help needed turning from your back to your side while in a flat bed without using bedrails?: A Little Help needed moving from lying on your back to sitting on the side of a flat bed without using bedrails?: A Little Help needed moving to and from a bed to a chair (including a wheelchair)?: A Lot Help needed standing up from a chair using your arms (e.g., wheelchair or bedside chair)?: A Lot Help needed to walk in hospital room?: A Little Help needed climbing 3-5 steps with a railing? : Total 6 Click Score: 14    End of Session   Activity Tolerance: Patient tolerated treatment well Patient left: in bed;with call bell/phone within reach;with bed alarm set Nurse Communication: Mobility status PT Visit Diagnosis: Unsteadiness on feet (R26.81);History of falling (Z91.81);Difficulty in walking, not elsewhere classified (R26.2);Pain Pain - Right/Left: Right(also L wrist) Pain - part of body: Arm;Hip     Time: [id number] PT Time Calculation (min) (ACUTE ONLY): 30 min  Charges:  $Gait Training: 8-22 mins $Therapeutic Activity: 8-22 mins                     Zenaida Niece, PT, DPT Acute Rehabilitation Pager: [phone]    Zenaida Niece 04/28/2020, 3:06 PM

## 2020-04-28 NOTE — Progress Notes (Signed)
Inpatient Rehabilitation Admissions Coordinator  Noted denial from [place] for [facility]. Patient not a candidate for [facility] for same level of care that [facility] has denied. Please call with any questions.  Danne Baxter, RN, MSN Rehab Admissions Coordinator [phone] 04/28/2020 4:40 PM

## 2020-04-28 NOTE — Progress Notes (Signed)
Nutrition Follow-up  RD working remotely.  DOCUMENTATION CODES:   Not applicable  INTERVENTION:   -Continue MVI with minerals daily -Continue Magic cup TID with meals, each supplement provides 290 kcal and 9 grams of protein  NUTRITION DIAGNOSIS:   Increased nutrient needs related to post-op healing as evidenced by estimated needs.  Ongoing  GOAL:   Patient will meet greater than or equal to 90% of their needs  Progressing   MONITOR:   PO intake, Supplement acceptance, Diet advancement, Labs, Weight trends, Skin, I & O's  REASON FOR ASSESSMENT:   Consult Assessment of nutrition requirement/status  ASSESSMENT:   Savannah Coleman is a 86 y.o. female with medical history significant of HTN.  Prior R femur fx in 2015. Admitted s/p fall.  5/3- s/p PROCEDURE:  1. RIGHT HIPANTEGRADE INTRAMEDULLARYNAILING WITH BIOMET AFFIXUS 11 MM X 360 MM  2.REMOVAL OF BIOMET RETROGRADE NAIL (Right) 3.Open Reduction Internal Fixation (Orif) Wrist Fracture (Right)DISTAL RADIUS DVR PLATE  Attempted to speak with pt via phone, however, no answer.   Pt's intake has improved since last visit; noted meal completion 50-100%. Pt does not like Ensure supplements, as they make her nauseous. She is being provided [employer] on her trays.   Per [facility] team notes, pt medically stable for discharge and awaiting insurance authorization for rehab facility.   Medications reviewed and include colace and miralax.   Labs reviewed.   Diet Order:   Diet Order            Diet regular Room service appropriate? Yes; Fluid consistency: Thin  Diet effective now              EDUCATION NEEDS:   No education needs have been identified at this time  Skin:  Skin Assessment: Skin Integrity Issues: Skin Integrity Issues:: Incisions Incisions: closed rt arm, closed lt leg Other: facial laceration above rt eye  Last BM:  04/27/20  Height:   Ht Readings from Last 1 Encounters:  04/19/20 5\' 2"   (1.575 m)    Weight:   Wt Readings from Last 1 Encounters:  04/19/20 57.2 kg    Ideal Body Weight:  50 kg  BMI:  Body mass index is 23.05 kg/m.  Estimated Nutritional Needs:   Kcal:  1450-1650  Protein:  70-85 grams  Fluid:  > 1.4 L    Loistine Chance, RD, LDN, [place] Registered Dietitian II Certified Diabetes Care and Education Specialist Please refer to [facility] for RD and/or RD on-call/weekend/after hours pager

## 2020-04-28 NOTE — Plan of Care (Signed)
  Problem: Activity: Goal: Ability to ambulate and perform ADLs will improve Outcome: Progressing   Problem: Pain Management: Goal: Pain level will decrease Outcome: Progressing

## 2020-04-28 NOTE — Progress Notes (Signed)
PROGRESS NOTE    Savannah Coleman  [id number] DOB: October 22, 1931 DOA: 04/17/2020 PCP: Jonathon Resides, MD    Brief Narrative:  86 y.o.femalewith medical history significant ofHTN, prior R femur fx in 2015. Pt presents to ED after she slipped and fell in shower. Pain to both wrists and R femur. Unable to bear wait, pain worse with movement. Couldn't bend or walk R leg. In the ED, noted a R wrist fx, and a much more complicated R perihardware lesser trochanter fracture of femur.  S/P surgery on wrist and hip.   Assessment & Plan:   Principal Problem:   Intertrochanteric fracture of right hip [facility]) Active Problems:   Hypertension   Closed fracture of right distal radius   Right periprosthetic intertrochanteric hip fracture s/p surgery on 04/19/2020 S/p mechanical fall X-ray with avulsion fx of the lesser trochanter of the R femur at the level of the proximal threaded screw CT head/cervical spine showed no acute fracture or intracranial abnormality Orthopedics : s/p surgery on 5/3 Pain management, DVT PPx as per orthopedics PT/OT following, recs for inpt rehab facility noted, TOC is following  Right wrist fracture s/p ORIF on 04/19/2020 X-ray showed mildly displaced transverse fracture of the right radial head Orthopedics on board, cont to follow recs PT/OT following  Macrocytic anemia/post op anemia Anemia panel unremarkable No evidence of acute bleed at this time Now on PO iron and vit C Plan to transfuse if hgb drops to <7.0  Hypertension Continue home amlodipine BP stable and controlled at this time  Hyperlipidemia Continue home statin as pt tolerates  GERD Continue PPI as pt tolerates  Hypokalemia Repleted  Constipation -bowel regimen  DVT prophylaxis: Lovenox subQ Code Status: Full Family Communication: Pt in room, family not at bedside  Status is: Inpatient  Remains inpatient appropriate because:Unsafe d/c plan, awaiting rehab bed   Dispo:  The patient is from: Home              Anticipated d/c is to: inpatient rehab facility              Anticipated d/c date is: when bed available               Patient currently is medically stable to d/c.   Consultants:   Orthopedic Surgery  Procedures:  1. RIGHT HIP ANTEGRADE INTRAMEDULLARY NAILING WITH BIOMET AFFIXUS 11 MM X 360 MM  2. REMOVAL OF BIOMET RETROGRADE NAIL (Right) 3. Open Reduction Internal Fixation (Orif) Wrist Fracture (Right) DISTAL RADIUS DVR PLATE  Antimicrobials: Anti-infectives (From admission, onward)   Start     Dose/Rate Route Frequency Ordered Stop   04/19/20 0731  ceFAZolin (ANCEF) 2-4 GM/100ML-% IVPB    Note to Pharmacy: Henrine Screws   : cabinet override      04/19/20 0731 04/19/20 0848   04/19/20 0730  ceFAZolin (ANCEF) IVPB 2g/100 mL premix     2 g 200 mL/hr over 30 Minutes Intravenous On call to O.R. 04/19/20 [id number] 04/19/20 [id number]      Subjective: Patient still complaining of postop pain, denies any other new complaints.  Objective: Vitals:   04/27/20 1918 04/28/20 0433 04/28/20 0855 04/28/20 1307  BP: 116/68 118/67 (!) 141/79 117/71  Pulse: 81 80 76 71  Resp: 18 16 17 17   Temp: 98 F (36.7 C) 98.6 F (37 C) 98.4 F (36.9 C) 97.9 F (36.6 C)  TempSrc: Oral Oral Oral Oral  SpO2: 97% 98% 98% 94%  Weight:  Height:        Intake/Output Summary (Last 24 hours) at 04/28/2020 1917 Last data filed at 04/28/2020 1845 Gross per 24 hour  Intake 360 ml  Output 400 ml  Net -40 ml   Filed Weights   04/17/20 1424 04/19/20 0654  Weight: 57.2 kg 57.2 kg    Examination:  General: NAD   Cardiovascular: S1, S2 present  Respiratory: CTAB  Abdomen: Soft, nontender, nondistended, bowel sounds present  Musculoskeletal: No bilateral pedal edema noted  Skin: Normal  Psychiatry: Normal mood                     Data Reviewed: I have personally reviewed following labs and imaging studies  CBC: Recent Labs  Lab 04/23/20 0427  04/24/20 0241 04/25/20 0442 04/26/20 0454 04/27/20 0547  WBC 8.1 9.0 8.0 7.9 9.4  NEUTROABS 4.9 5.5 4.6 5.2 6.0  HGB 7.6* 7.8* 7.8* 7.9* 8.4*  HCT 23.6* 24.3* 24.2* 25.5* 26.7*  MCV 102.6* 103.0* 103.9* 102.8* 103.9*  PLT 213 274 320 381 [id number]*   Basic Metabolic Panel: Recent Labs  Lab 04/22/20 0308 04/23/20 0427 04/26/20 0454  NA 136 138 139  K 3.6 3.5 3.4*  CL 102 102 101  CO2 30 32 29  GLUCOSE 102* 99 102*  BUN 13 10 10   CREATININE 0.62 0.58 0.66  CALCIUM 8.8* 9.0 9.2   GFR: Estimated Creatinine Clearance: 39.9 mL/min (by C-G formula based on SCr of 0.66 mg/dL). Liver Function Tests: Recent Labs  Lab 04/26/20 0454  AST 31  ALT 30  ALKPHOS 68  BILITOT 1.0  PROT 5.3*  ALBUMIN 2.6*   No results for input(s): LIPASE, AMYLASE in the last 168 hours. No results for input(s): AMMONIA in the last 168 hours. Coagulation Profile: No results for input(s): INR, PROTIME in the last 168 hours. Cardiac Enzymes: No results for input(s): CKTOTAL, CKMB, CKMBINDEX, TROPONINI in the last 168 hours. BNP (last 3 results) No results for input(s): PROBNP in the last 8760 hours. HbA1C: No results for input(s): HGBA1C in the last 72 hours. CBG: No results for input(s): GLUCAP in the last 168 hours. Lipid Profile: No results for input(s): CHOL, HDL, LDLCALC, TRIG, CHOLHDL, LDLDIRECT in the last 72 hours. Thyroid Function Tests: No results for input(s): TSH, T4TOTAL, FREET4, T3FREE, THYROIDAB in the last 72 hours. Anemia Panel: No results for input(s): VITAMINB12, FOLATE, FERRITIN, TIBC, IRON, RETICCTPCT in the last 72 hours. Sepsis Labs: No results for input(s): PROCALCITON, LATICACIDVEN in the last 168 hours.  No results found for this or any previous visit (from the past 240 hour(s)).   Radiology Studies: No results found.  Scheduled Meds: . acetaminophen  500 mg Oral Q12H  . amLODipine  5 mg Oral Daily  . vitamin C  500 mg Oral Daily  . bacitracin   Topical BID  .  docusate sodium  100 mg Oral BID  . enoxaparin (LOVENOX) injection  40 mg Subcutaneous Q24H  . ferrous sulfate  325 mg Oral Q breakfast  . multivitamin with minerals  1 tablet Oral Daily  . pantoprazole  40 mg Oral Daily  . polyethylene glycol  17 g Oral Daily  . simvastatin  20 mg Oral QHS   Continuous Infusions:   LOS: 11 days   Alma Friendly, MD [facility] Pager On Amion  If 7PM-7AM, please contact night-coverage 04/28/2020, 7:17 PM

## 2020-04-28 NOTE — Progress Notes (Signed)
NCM received call from [facility]ie [facility] admission liaison informing NCM pt has been denied for inpatient rehab per insurance. MD can do peer to peer , [phone], option 3, by 4:30 pm today. NCM made MD aware. TOC team will continue to monitor. Pt/ daughter was made aware by Leigh. Whitman Hero RN,BSN,CM

## 2020-04-29 ENCOUNTER — Inpatient Hospital Stay (HOSPITAL_COMMUNITY): Payer: Medicare HMO

## 2020-04-29 LAB — CBC WITH DIFFERENTIAL/PLATELET
Abs Immature Granulocytes: 0.1 10*3/uL — ABNORMAL HIGH (ref 0.00–0.07)
Basophils Absolute: 0.1 10*3/uL (ref 0.0–0.1)
Basophils Relative: 1 %
Eosinophils Absolute: 0.4 10*3/uL (ref 0.0–0.5)
Eosinophils Relative: 4 %
HCT: 26.6 % — ABNORMAL LOW (ref 36.0–46.0)
Hemoglobin: 8.4 g/dL — ABNORMAL LOW (ref 12.0–15.0)
Immature Granulocytes: 1 %
Lymphocytes Relative: 16 %
Lymphs Abs: 1.5 10*3/uL (ref 0.7–4.0)
MCH: 32.9 pg (ref 26.0–34.0)
MCHC: 31.6 g/dL (ref 30.0–36.0)
MCV: 104.3 fL — ABNORMAL HIGH (ref 80.0–100.0)
Monocytes Absolute: 1.3 10*3/uL — ABNORMAL HIGH (ref 0.1–1.0)
Monocytes Relative: 13 %
Neutro Abs: 6.6 10*3/uL (ref 1.7–7.7)
Neutrophils Relative %: 65 %
Platelets: 477 10*3/uL — ABNORMAL HIGH (ref 150–400)
RBC: 2.55 MIL/uL — ABNORMAL LOW (ref 3.87–5.11)
RDW: 15.9 % — ABNORMAL HIGH (ref 11.5–15.5)
WBC: 9.9 10*3/uL (ref 4.0–10.5)
nRBC: 0 % (ref 0.0–0.2)

## 2020-04-29 LAB — BASIC METABOLIC PANEL
Anion gap: 9 (ref 5–15)
BUN: 11 mg/dL (ref 8–23)
CO2: 26 mmol/L (ref 22–32)
Calcium: 9.2 mg/dL (ref 8.9–10.3)
Chloride: 103 mmol/L (ref 98–111)
Creatinine, Ser: 0.73 mg/dL (ref 0.44–1.00)
GFR calc Af Amer: >60 mL/min (ref >60)
GFR calc non Af Amer: >60 mL/min (ref >60)
Glucose, Bld: 97 mg/dL (ref 70–99)
Potassium: 3.8 mmol/L (ref 3.5–5.1)
Sodium: 138 mmol/L (ref 135–145)

## 2020-04-29 NOTE — TOC Progression Note (Signed)
Transition of Care [facility]) - Progression Note    Patient Details  Name: Savannah Coleman MRN: [id number] Date of Birth: [birth year]  [facility] CM/SW Contact  Sharin Mons, RN Phone Number: [phone] 04/29/2020, 10:09 AM  Clinical Narrative:     MD peer to peer in process for IPR. TOC team will continue to monitor.  Expected Discharge Plan: Skilled Nursing Facility Barriers to Discharge: Other (comment)(insurance denied IPR, MD peer to peer in process)  Expected Discharge Plan and Services Expected Discharge Plan: [place]                                               Social Determinants of Health (SDOH) Interventions    Readmission Risk Interventions No flowsheet data found.

## 2020-04-29 NOTE — Progress Notes (Signed)
NCM received call from MD informing NCM peer to peer completed and decision was upheld, IPR denied.NCM made pt and daughter Santiago Glad aware. Santiago Glad stated family will do an expedited appeal. TOC team will continue to monitor and follow. Whitman Hero RN,BSN,CM [phone]

## 2020-04-29 NOTE — Progress Notes (Signed)
PROGRESS NOTE    Savannah Coleman  [id number] DOB: Aug 29, 1931 DOA: 04/17/2020 PCP: Jonathon Resides, MD    Brief Narrative:  86 y.o.femalewith medical history significant ofHTN, prior R femur fx in 2015. Pt presents to ED after she slipped and fell in shower. Pain to both wrists and R femur. Unable to bear wait, pain worse with movement. Couldn't bend or walk R leg. In the ED, noted a R wrist fx, and a much more complicated R perihardware lesser trochanter fracture of femur.  S/P surgery on wrist and hip.   Assessment & Plan:   Principal Problem:   Intertrochanteric fracture of right hip [facility]) Active Problems:   Hypertension   Closed fracture of right distal radius   Right periprosthetic intertrochanteric hip fracture s/p surgery on 04/19/2020 S/p mechanical fall X-ray with avulsion fx of the lesser trochanter of the R femur at the level of the proximal threaded screw CT head/cervical spine showed no acute fracture or intracranial abnormality Orthopedics : s/p surgery on 5/3 Pain management, DVT PPx as per orthopedics PT/OT following, recs for inpt rehab facility noted, TOC is following  Right wrist fracture s/p ORIF on 04/19/2020 X-ray showed mildly displaced transverse fracture of the right radial head Orthopedics on board, cont to follow recs PT/OT following  Macrocytic anemia/post op anemia Anemia panel unremarkable No evidence of acute bleed at this time Now on PO iron and vit C Plan to transfuse if hgb drops to <7.0  Hypertension Continue home amlodipine BP stable and controlled at this time  Hyperlipidemia Continue home statin as pt tolerates  GERD Continue PPI as pt tolerates  Constipation -bowel regimen  DVT prophylaxis: Lovenox subQ Code Status: Full Family Communication: Pt in room, family not at bedside  Status is: Inpatient  Remains inpatient appropriate because:Unsafe d/c plan, awaiting rehab bed   Dispo: The patient is from: Home              Anticipated d/c is to: SNF              Anticipated d/c date is: when bed available               Patient currently is medically stable to d/c.   Consultants:   Orthopedic Surgery  Procedures:  1. RIGHT HIP ANTEGRADE INTRAMEDULLARY NAILING WITH BIOMET AFFIXUS 11 MM X 360 MM  2. REMOVAL OF BIOMET RETROGRADE NAIL (Right) 3. Open Reduction Internal Fixation (Orif) Wrist Fracture (Right) DISTAL RADIUS DVR PLATE  Antimicrobials: Anti-infectives (From admission, onward)   Start     Dose/Rate Route Frequency Ordered Stop   04/19/20 0731  ceFAZolin (ANCEF) 2-4 GM/100ML-% IVPB    Note to Pharmacy: Henrine Screws   : cabinet override      04/19/20 0731 04/19/20 0848   04/19/20 0730  ceFAZolin (ANCEF) IVPB 2g/100 mL premix     2 g 200 mL/hr over 30 Minutes Intravenous On call to O.R. 04/19/20 [id number] 04/19/20 0906      Subjective: Patient denies any new complaints.  Objective: Vitals:   04/28/20 1307 04/28/20 2003 04/29/20 0520 04/29/20 0834  BP: 117/71 109/62 129/69 131/79  Pulse: 71 81 84 75  Resp: 17 16 16 17   Temp: 97.9 F (36.6 C) 98.4 F (36.9 C) 98.5 F (36.9 C) 98.4 F (36.9 C)  TempSrc: Oral Oral Oral Oral  SpO2: 94% 96% 98% 96%  Weight:      Height:        Intake/Output Summary (Last 24 hours)  at 04/29/2020 1350 Last data filed at 04/29/2020 0900 Gross per 24 hour  Intake 360 ml  Output 800 ml  Net -440 ml   Filed Weights   04/17/20 1424 04/19/20 0654  Weight: 57.2 kg 57.2 kg    Examination:  General: NAD   Cardiovascular: S1, S2 present  Respiratory: CTAB  Abdomen: Soft, nontender, nondistended, bowel sounds present  Musculoskeletal: No bilateral pedal edema noted, R arm in splint  Skin: Normal  Psychiatry: Normal mood                     Data Reviewed: I have personally reviewed following labs and imaging studies  CBC: Recent Labs  Lab 04/24/20 0241 04/25/20 0442 04/26/20 0454 04/27/20 0547 04/29/20 0420  WBC 9.0 8.0 7.9  9.4 9.9  NEUTROABS 5.5 4.6 5.2 6.0 6.6  HGB 7.8* 7.8* 7.9* 8.4* 8.4*  HCT 24.3* 24.2* 25.5* 26.7* 26.6*  MCV 103.0* 103.9* 102.8* 103.9* 104.3*  PLT 274 320 381 412* [id number]*   Basic Metabolic Panel: Recent Labs  Lab 04/23/20 0427 04/26/20 0454 04/29/20 0420  NA 138 139 138  K 3.5 3.4* 3.8  CL 102 101 103  CO2 32 29 26  GLUCOSE 99 102* 97  BUN 10 10 11   CREATININE 0.58 0.66 0.73  CALCIUM 9.0 9.2 9.2   GFR: Estimated Creatinine Clearance: 39.9 mL/min (by C-G formula based on SCr of 0.73 mg/dL). Liver Function Tests: Recent Labs  Lab 04/26/20 0454  AST 31  ALT 30  ALKPHOS 68  BILITOT 1.0  PROT 5.3*  ALBUMIN 2.6*   No results for input(s): LIPASE, AMYLASE in the last 168 hours. No results for input(s): AMMONIA in the last 168 hours. Coagulation Profile: No results for input(s): INR, PROTIME in the last 168 hours. Cardiac Enzymes: No results for input(s): CKTOTAL, CKMB, CKMBINDEX, TROPONINI in the last 168 hours. BNP (last 3 results) No results for input(s): PROBNP in the last 8760 hours. HbA1C: No results for input(s): HGBA1C in the last 72 hours. CBG: No results for input(s): GLUCAP in the last 168 hours. Lipid Profile: No results for input(s): CHOL, HDL, LDLCALC, TRIG, CHOLHDL, LDLDIRECT in the last 72 hours. Thyroid Function Tests: No results for input(s): TSH, T4TOTAL, FREET4, T3FREE, THYROIDAB in the last 72 hours. Anemia Panel: No results for input(s): VITAMINB12, FOLATE, FERRITIN, TIBC, IRON, RETICCTPCT in the last 72 hours. Sepsis Labs: No results for input(s): PROCALCITON, LATICACIDVEN in the last 168 hours.  No results found for this or any previous visit (from the past 240 hour(s)).   Radiology Studies: No results found.  Scheduled Meds: . acetaminophen  500 mg Oral Q12H  . amLODipine  5 mg Oral Daily  . vitamin C  500 mg Oral Daily  . bacitracin   Topical BID  . docusate sodium  100 mg Oral BID  . enoxaparin (LOVENOX) injection  40 mg  Subcutaneous Q24H  . ferrous sulfate  325 mg Oral Q breakfast  . multivitamin with minerals  1 tablet Oral Daily  . pantoprazole  40 mg Oral Daily  . polyethylene glycol  17 g Oral Daily  . simvastatin  20 mg Oral QHS   Continuous Infusions:   LOS: 12 days   Alma Friendly, MD [facility] Pager On Amion  If 7PM-7AM, please contact night-coverage 04/29/2020, 1:50 PM

## 2020-04-29 NOTE — Progress Notes (Signed)
Occupational Therapy Treatment Patient Details Name: Savannah Coleman MRN: [id number] DOB: 1931/12/04 Today's Date: 04/29/2020    History of present illness Savannah Coleman is a 86 y.o. female with medical history significant of HTN, prior R femur fx in 2015. Pt presents to ED after she slipped and fell in shower. S/p right hip IM nail and right wrist ORIF.   OT comments  Patient progressing well towards OT goals. She is highly motivated and eager to progress to rehab facility.  She remains limited by pain, precaution adherence, decreased activity tolerance and generalized weakness.  Patient completing transfers with min-mod assist, in room mobility using B platform RW with min guard and grooming standing at sink with min guard assist.  Continued to recommend inpatient rehab at dc.  Will follow acutely.    Follow Up Recommendations  Other (comment)(Inpatient Rehab Facility ([facility]) )    Equipment Recommendations  3 in 1 bedside commode    Recommendations for Other Services      Precautions / Restrictions Precautions Precautions: Fall Precaution Comments: pt fell in shower  Restrictions Weight Bearing Restrictions: Yes RUE Weight Bearing: Weight bear through elbow only RLE Weight Bearing: Weight bearing as tolerated       Mobility Bed Mobility Overal bed mobility: Needs Assistance Bed Mobility: Supine to Sit;Sit to Supine     Supine to sit: Min assist Sit to supine: Mod assist   General bed mobility comments: min assist to managed R LE towards EOB and BLEs back to supine; cueing throughout to avoid weightbearing through R wrist   Transfers Overall transfer level: Needs assistance Equipment used: Bilateral platform walker Transfers: Sit to/from Stand Sit to Stand: Min assist;Mod assist         General transfer comment: min to mod assist to power up and steady from EOB and BSC, requires cueing for hand placement and technique     Balance Overall balance assessment:  Needs assistance Sitting-balance support: No upper extremity supported;Feet supported Sitting balance-Leahy Scale: Fair Sitting balance - Comments: supervision at EOB    Standing balance support: Bilateral upper extremity supported;During functional activity;Single extremity supported Standing balance-Leahy Scale: Fair Standing balance comment: relies on platform RW dynamically, can engage in ADLs with 1 UE support                           ADL either performed or assessed with clinical judgement   ADL Overall ADL's : Needs assistance/impaired     Grooming: Min guard;Oral care;Standing Grooming Details (indicate cue type and reason): at sink standing                  Toilet Transfer: Minimal assistance;Regular Toilet;BSC;Ambulation(B platform RW ) Toilet Transfer Details (indicate cue type and reason): 3:1 over commode in bathroom  Toileting- Clothing Manipulation and Hygiene: Minimal assistance;Sit to/from stand Toileting - Clothing Manipulation Details (indicate cue type and reason): clothing mgmt      Functional mobility during ADLs: Min guard;Cueing for safety(B platform RW ) General ADL Comments: pt with BUEs on RW with transitions, limited by pain in B wrists (L>R) and R LE, impaired balance and decreased activity tolerance     Vision       Perception     Praxis      Cognition Arousal/Alertness: Awake/alert Behavior During Therapy: WFL for tasks assessed/performed Overall Cognitive Status: Impaired/Different from baseline Area of Impairment: Memory;Safety/judgement  Memory: Decreased recall of precautions;Decreased short-term memory   Safety/Judgement: Decreased awareness of deficits;Decreased awareness of safety     General Comments: patient requires cueing to adhere to elbow weight bearing to R UE, cueing for safety         Exercises     Shoulder Instructions       General Comments VSS on RA      Pertinent Vitals/ Pain       Pain Assessment: Faces Faces Pain Scale: Hurts even more Pain Location: L wrist Pain Descriptors / Indicators: Grimacing Pain Intervention(s): Monitored during session;Repositioned;Limited activity within patient's tolerance  Home Living                                          Prior Functioning/Environment              Frequency  Min 2X/week        Progress Toward Goals  OT Goals(current goals can now be found in the care plan section)  Progress towards OT goals: Progressing toward goals  Acute Rehab OT Goals Patient Stated Goal: to get stronger OT Goal Formulation: With patient  Plan Discharge plan remains appropriate;Frequency remains appropriate    Co-evaluation                 AM-PAC OT "6 Clicks" Daily Activity     Outcome Measure   Help from another person eating meals?: A Little Help from another person taking care of personal grooming?: A Little Help from another person toileting, which includes using toliet, bedpan, or urinal?: A Little Help from another person bathing (including washing, rinsing, drying)?: A Little Help from another person to put on and taking off regular upper body clothing?: A Little Help from another person to put on and taking off regular lower body clothing?: A Lot 6 Click Score: 17    End of Session Equipment Utilized During Treatment: Gait belt(B platform RW )  OT Visit Diagnosis: Unsteadiness on feet (R26.81);Other abnormalities of gait and mobility (R26.89);Muscle weakness (generalized) (M62.81);Pain Pain - Right/Left: (bil) Pain - part of body: Leg;Hand(L wrist, R wrist/leg)   Activity Tolerance Patient tolerated treatment well   Patient Left in bed;with bed alarm set;with call bell/phone within reach   Nurse Communication Mobility status        Time: [id number] OT Time Calculation (min): 28 min  Charges: OT General Charges $OT Visit: 1 Visit OT  Treatments $Self Care/Home Management : 23-37 mins  [place] Pager [phone] Office [phone]    Delight Stare 04/29/2020, 11:03 AM

## 2020-04-29 NOTE — Plan of Care (Signed)
  Problem: Activity: Goal: Ability to ambulate and perform ADLs will improve Outcome: Progressing   Problem: Pain Management: Goal: Pain level will decrease Outcome: Progressing

## 2020-04-29 NOTE — Progress Notes (Signed)
Physical Therapy Treatment Patient Details Name: Savannah Coleman MRN: [id number] DOB: 04-08-1931 Today's Date: 04/29/2020    History of Present Illness Savannah Coleman is a 86 y.o. female with medical history significant of HTN, prior R femur fx in 2015. Pt presents to ED after she slipped and fell in shower. S/p right hip IM nail and right wrist ORIF.    PT Comments    Pt supine in bed on arrival this session.  She reports she is in increased pain in L wrist and refused OOB activity due to the pain which is not like her.  She did agree to supine LE exercises this session and postural exercises in long sitting with bed tilted.  Informed ortho PA and RN of increased pain in L wrist.  L wrist is warm to touch and painful with movement into flexion and extension.  Pt presents with decreased grip strength in L hand.  Continue to recommend aggressive rehab in a post acute setting to maximize functional gains and improve strength before returning home.     Follow Up Recommendations  CIR     Equipment Recommendations  Rolling walker with 5" wheels(w/ B platform attachments.)    Recommendations for Other Services       Precautions / Restrictions Precautions Precautions: Fall Precaution Comments: pt fell in shower  Restrictions Weight Bearing Restrictions: Yes RUE Weight Bearing: Weight bear through elbow only(other wise NWB.) RLE Weight Bearing: Weight bearing as tolerated Other Position/Activity Restrictions: weight bearing through R elbow okay    Mobility  Bed Mobility Overal bed mobility: Needs Assistance Bed Mobility: Sit to Supine;Supine to Sit     Supine to sit: Min assist Sit to supine: Min assist   General bed mobility comments: Only performed supine to long sitting and back to supine for postural exercises.  Performed scooting in bed post session for positioning.  Transfers Overall transfer level: (Pt refused OOB activity due to L wrist pain.)                   Ambulation/Gait                 Stairs             Wheelchair Mobility    Modified Rankin (Stroke Patients Only)       Balance                                            Cognition Arousal/Alertness: Awake/alert Behavior During Therapy: WFL for tasks assessed/performed Overall Cognitive Status: Impaired/Different from baseline Area of Impairment: Memory;Safety/judgement                     Memory: Decreased recall of precautions;Decreased short-term memory   Safety/Judgement: Decreased awareness of deficits;Decreased awareness of safety     General Comments: patient requires cueing to adhere to elbow weight bearing to R UE, cueing for safety       Exercises General Exercises - Lower Extremity Ankle Circles/Pumps: AROM;Both;20 reps;Supine Quad Sets: AROM;Both;10 reps;Supine Short Arc Quad: AROM;Both;10 reps;Supine Heel Slides: AROM;AAROM;Both;10 reps;Supine Hip ABduction/ADduction: AROM;AAROM;Both;10 reps;Supine Shoulder Exercises Neck Flexion: AROM;10 reps Neck Extension: AROM;10 reps Other Exercises Other Exercises: B scap retraction x10, B shoulder shrugs x10, cervical horizontal rotation x10 and reverse prayer stretch x1 min.    General Comments        Pertinent  Vitals/Pain Pain Assessment: 0-10 Pain Score: 6  Pain Location: L wrist with flexion and extension- noted with significant decrease in strength, also R knee with flexion Pain Descriptors / Indicators: Grimacing;Guarding Pain Intervention(s): Monitored during session;Repositioned    Home Living                      Prior Function            PT Goals (current goals can now be found in the care plan section) Acute Rehab PT Goals Patient Stated Goal: to get stronger PT Goal Formulation: With patient Potential to Achieve Goals: Good Progress towards PT goals: Progressing toward goals(limited due to L wrist pain and refusing OOB today)     Frequency    Min 4X/week      PT Plan Current plan remains appropriate    Co-evaluation              AM-PAC PT "6 Clicks" Mobility   Outcome Measure  Help needed turning from your back to your side while in a flat bed without using bedrails?: A Little Help needed moving from lying on your back to sitting on the side of a flat bed without using bedrails?: A Little Help needed moving to and from a bed to a chair (including a wheelchair)?: A Lot Help needed standing up from a chair using your arms (e.g., wheelchair or bedside chair)?: A Lot Help needed to walk in hospital room?: A Little Help needed climbing 3-5 steps with a railing? : Total 6 Click Score: 14    End of Session   Activity Tolerance: Patient limited by pain(in L wrist)   Nurse Communication: Mobility status(informed nursing of L wrist pain) PT Visit Diagnosis: Unsteadiness on feet (R26.81);History of falling (Z91.81);Difficulty in walking, not elsewhere classified (R26.2);Pain Pain - Right/Left: Right(also L wrist) Pain - part of body: Arm;Hip     Time: [id number] PT Time Calculation (min) (ACUTE ONLY): 28 min  Charges:  $Therapeutic Exercise: 8-22 mins $Therapeutic Activity: 8-22 mins                     Erasmo Leventhal , PTA Acute Rehabilitation Services Pager [phone] Office [phone]     Savannah Coleman 04/29/2020, 4:28 PM

## 2020-04-30 ENCOUNTER — Inpatient Hospital Stay (HOSPITAL_COMMUNITY): Payer: Medicare HMO

## 2020-04-30 ENCOUNTER — Encounter (HOSPITAL_COMMUNITY): Payer: Self-pay | Admitting: Internal Medicine

## 2020-04-30 DIAGNOSIS — S52502A Unspecified fracture of the lower end of left radius, initial encounter for closed fracture: Secondary | ICD-10-CM | POA: Diagnosis present

## 2020-04-30 NOTE — Progress Notes (Addendum)
Orthopaedic Trauma [facility]   Follow up xrays L distal radius show L distal radius fracture This was not clearly evident on initial admission xrays  Think she would do very well with non-op management Plan for short arm cast x 4-6 weeks NWB L wrist but ok to WB thru elbow  Jari Pigg, PA-C [phone] (C) 04/30/2020, 8:45 AM  [facility] [place] [address] [phone] Domingo Sep (F)

## 2020-04-30 NOTE — Progress Notes (Addendum)
NCM received call from [facility] [place]. Leigh informed NCM pt's appeal overturned and  insurance approval received for IPR. [facility] to receive pt by 1pm, 4/15. Nurse to call report to  [phone] or [phone]. Pt will d/c to [facility] [address] [place] , [place]. Prior to d/c please fax d/c summary and MAR to [phone]. Whitman Hero RN, BSN,CM

## 2020-04-30 NOTE — Progress Notes (Signed)
Ainsley Spinner PA is at the bedside.  Short arm cast applied per ortho tech.  NPO status has been changed.  Patient will remain in cast4-6 weeks.  The patient is aware

## 2020-04-30 NOTE — Progress Notes (Signed)
Sutures removed with slight difficulty due to the sutures being in place since repair.  3 sutures removed from inner right knee area, 3 sutures removed from outer right knee and 4 sutures removed from the right wrist area.  Right wrist immobilized with velcro wrist splint  Incisions appear to be healing well.  No redness or drainage noted.  Edges of incision are intact.

## 2020-04-30 NOTE — Progress Notes (Signed)
PT Cancellation Note  Patient Details Name: Savannah Coleman MRN: [id number] DOB: 1931-07-25   Cancelled Treatment:    Reason Eval/Treat Not Completed: (P) Patient at procedure or test/unavailable(pt off unit for xray of L wrist in cast. will f/u per POC.)   [name] [name] 04/30/2020, 10:55 AM  Erasmo Leventhal , PTA Acute Rehabilitation Services Pager [phone] Office [phone]

## 2020-04-30 NOTE — Progress Notes (Signed)
Orthopedic Tech Progress Note Patient Details:  Savannah Coleman 1931/05/10 [id number] With PA PAUL assistances Casting Type of Cast: Short arm cast Cast Location: LUE Cast Material: Fiberglass Cast Intervention: Application  Post Interventions Patient Tolerated: Well Instructions Provided: Care of device    Ortho Devices Type of Ortho Device: Velcro wrist forearm splint Ortho Device/Splint Location: RUE Ortho Device/Splint Interventions: Ordered, Application   Post Interventions Patient Tolerated: Well Instructions Provided: Care of device   Janit Pagan 04/30/2020, 11:26 AM

## 2020-04-30 NOTE — Progress Notes (Signed)
PROGRESS NOTE    Savannah Coleman  [id number] DOB: 06-16-1931 DOA: 04/17/2020 PCP: Jonathon Resides, MD    Brief Narrative:  86 y.o.femalewith medical history significant ofHTN, prior R femur fx in 2015. Pt presents to ED after she slipped and fell in shower. Pain to both wrists and R femur. Unable to bear wait, pain worse with movement. Couldn't bend or walk R leg. In the ED, noted a R wrist fx, and a much more complicated R perihardware lesser trochanter fracture of femur.  S/P surgery on wrist and hip.   Assessment & Plan:   Principal Problem:   Intertrochanteric fracture of right hip [facility]) Active Problems:   Hypertension   Closed fracture of right distal radius   Closed fracture of left distal radius   Right periprosthetic intertrochanteric hip fracture s/p surgery on 04/19/2020 S/p mechanical fall X-ray with avulsion fx of the lesser trochanter of the R femur at the level of the proximal threaded screw CT head/cervical spine showed no acute fracture or intracranial abnormality Orthopedics : s/p surgery on 5/3 Pain management, DVT PPx as per orthopedics PT/OT following, recs for inpt rehab facility noted, TOC is following  Right wrist fracture s/p ORIF on 04/19/2020 Left distal radius fracture visible on 5/13 film after pt c/o worsening pain X-ray showed mildly displaced transverse fracture of the right radial head Orthopedics on board, cont to follow recs, Left cast applied by ortho on 04/30/20- total cast time 6 weeks PT/OT following  Macrocytic anemia/post op anemia Anemia panel unremarkable No evidence of acute bleed at this time Now on PO iron and vit C Plan to transfuse if hgb drops to <7.0  Hypertension Continue home amlodipine BP stable and controlled at this time  Hyperlipidemia Continue home statin as pt tolerates  GERD Continue PPI as pt tolerates  Constipation -bowel regimen  DVT prophylaxis: Lovenox subQ Code Status: Full Family  Communication: Pt in room, family not at bedside  Status is: Inpatient  Remains inpatient appropriate because:Unsafe d/c plan, awaiting rehab bed   Dispo: The patient is from: Home              Anticipated d/c is to: SNF              Anticipated d/c date is: when bed available               Patient currently is medically stable to d/c.   Consultants:   Orthopedic Surgery  Procedures:  1. RIGHT HIP ANTEGRADE INTRAMEDULLARY NAILING WITH BIOMET AFFIXUS 11 MM X 360 MM  2. REMOVAL OF BIOMET RETROGRADE NAIL (Right) 3. Open Reduction Internal Fixation (Orif) Wrist Fracture (Right) DISTAL RADIUS DVR PLATE  Antimicrobials: Anti-infectives (From admission, onward)   Start     Dose/Rate Route Frequency Ordered Stop   04/19/20 0731  ceFAZolin (ANCEF) 2-4 GM/100ML-% IVPB    Note to Pharmacy: Henrine Screws   : cabinet override      04/19/20 0731 04/19/20 0848   04/19/20 0730  ceFAZolin (ANCEF) IVPB 2g/100 mL premix     2 g 200 mL/hr over 30 Minutes Intravenous On call to O.R. 04/19/20 0728 04/19/20 0906      Subjective: Patient denies any new complaints  Objective: Vitals:   04/29/20 1514 04/29/20 2018 04/30/20 0304 04/30/20 1248  BP: 129/73 112/71 126/67 117/64  Pulse: 74 75 68 69  Resp: 17 16 16 16   Temp: 98.1 F (36.7 C) 97.8 F (36.6 C) 97.6 F (36.4 C) 98 F (  36.7 C)  TempSrc: Oral Oral Oral Oral  SpO2: 97% 94% 98% 91%  Weight:      Height:        Intake/Output Summary (Last 24 hours) at 04/30/2020 1501 Last data filed at 04/30/2020 0826 Gross per 24 hour  Intake 240 ml  Output --  Net 240 ml   Filed Weights   04/17/20 1424 04/19/20 0654  Weight: 57.2 kg 57.2 kg    Examination:  General: NAD   Cardiovascular: S1, S2 present  Respiratory: CTAB  Abdomen: Soft, nontender, nondistended, bowel sounds present  Musculoskeletal: No bilateral pedal edema noted, R arm in splint, L forearm in splint  Skin: Normal  Psychiatry: Normal mood                      Data Reviewed: I have personally reviewed following labs and imaging studies  CBC: Recent Labs  Lab 04/24/20 0241 04/25/20 0442 04/26/20 0454 04/27/20 0547 04/29/20 0420  WBC 9.0 8.0 7.9 9.4 9.9  NEUTROABS 5.5 4.6 5.2 6.0 6.6  HGB 7.8* 7.8* 7.9* 8.4* 8.4*  HCT 24.3* 24.2* 25.5* 26.7* 26.6*  MCV 103.0* 103.9* 102.8* 103.9* 104.3*  PLT 274 320 381 412* [id number]*   Basic Metabolic Panel: Recent Labs  Lab 04/26/20 0454 04/29/20 0420  NA 139 138  K 3.4* 3.8  CL 101 103  CO2 29 26  GLUCOSE 102* 97  BUN 10 11  CREATININE 0.66 0.73  CALCIUM 9.2 9.2   GFR: Estimated Creatinine Clearance: 39.9 mL/min (by C-G formula based on SCr of 0.73 mg/dL). Liver Function Tests: Recent Labs  Lab 04/26/20 0454  AST 31  ALT 30  ALKPHOS 68  BILITOT 1.0  PROT 5.3*  ALBUMIN 2.6*   No results for input(s): LIPASE, AMYLASE in the last 168 hours. No results for input(s): AMMONIA in the last 168 hours. Coagulation Profile: No results for input(s): INR, PROTIME in the last 168 hours. Cardiac Enzymes: No results for input(s): CKTOTAL, CKMB, CKMBINDEX, TROPONINI in the last 168 hours. BNP (last 3 results) No results for input(s): PROBNP in the last 8760 hours. HbA1C: No results for input(s): HGBA1C in the last 72 hours. CBG: No results for input(s): GLUCAP in the last 168 hours. Lipid Profile: No results for input(s): CHOL, HDL, LDLCALC, TRIG, CHOLHDL, LDLDIRECT in the last 72 hours. Thyroid Function Tests: No results for input(s): TSH, T4TOTAL, FREET4, T3FREE, THYROIDAB in the last 72 hours. Anemia Panel: No results for input(s): VITAMINB12, FOLATE, FERRITIN, TIBC, IRON, RETICCTPCT in the last 72 hours. Sepsis Labs: No results for input(s): PROCALCITON, LATICACIDVEN in the last 168 hours.  No results found for this or any previous visit (from the past 240 hour(s)).   Radiology Studies: DG Wrist Complete Left  Result Date: 04/30/2020 CLINICAL DATA:  Status post casting and  immobilization left radial fracture. EXAM: LEFT WRIST - COMPLETE 3+ VIEW COMPARISON:  Apr 29, 2020. FINDINGS: The left wrist has been casted and immobilized. Distal left radial fracture appears to be stable in position. IMPRESSION: Status post casting and immobilization of distal left radial fracture. Electronically Signed   By: Marijo Conception M.D.   On: 04/30/2020 12:42   DG Wrist Complete Left  Result Date: 04/29/2020 CLINICAL DATA:  Left wrist pain after fall EXAM: LEFT WRIST - COMPLETE 3+ VIEW COMPARISON:  04/17/2020 FINDINGS: There is a left distal radial metaphyseal fracture. Mild impaction and posterior angulation. No subluxation or dislocation. Advanced degenerative changes in the carpal region. IMPRESSION:  Distal left radial metaphyseal fracture with impaction and posterior angulation. Electronically Signed   By: Rolm Baptise M.D.   On: 04/29/2020 18:35    Scheduled Meds: . acetaminophen  500 mg Oral Q12H  . amLODipine  5 mg Oral Daily  . vitamin C  500 mg Oral Daily  . bacitracin   Topical BID  . docusate sodium  100 mg Oral BID  . enoxaparin (LOVENOX) injection  40 mg Subcutaneous Q24H  . ferrous sulfate  325 mg Oral Q breakfast  . multivitamin with minerals  1 tablet Oral Daily  . pantoprazole  40 mg Oral Daily  . polyethylene glycol  17 g Oral Daily  . simvastatin  20 mg Oral QHS   Continuous Infusions:   LOS: 13 days   Alma Friendly, MD [facility] Pager On Amion  If 7PM-7AM, please contact night-coverage 04/30/2020, 3:01 PM

## 2020-04-30 NOTE — Progress Notes (Signed)
Orthopaedic Trauma Service Progress Note  Patient ID: Savannah Coleman MRN: [id number] DOB/AGE: 02-07-1931 86 y.o.  Subjective:  Doing well this am No complaints other than some mild L wrist pain   PT contacted me to make aware of increasing pain of L wrist  New xrays ordered + L distal radius fracture  Initial xrays from 04/17/2020 do not show fracture clearly   Insurance denied inpatient rehab and this was upheld after peer to peer  Family going to do appeal   R wrist and R hip feel good   ROS As above  Objective:   VITALS:   Vitals:   04/29/20 0834 04/29/20 1514 04/29/20 2018 04/30/20 0304  BP: 131/79 129/73 112/71 126/67  Pulse: 75 74 75 68  Resp: 17 17 16 16   Temp: 98.4 F (36.9 C) 98.1 F (36.7 C) 97.8 F (36.6 C) 97.6 F (36.4 C)  TempSrc: Oral Oral Oral Oral  SpO2: 96% 97% 94% 98%  Weight:      Height:        Estimated body mass index is 23.05 kg/m as calculated from the following:   Height as of this encounter: 5\' 2"  (1.575 m).   Weight as of this encounter: 57.2 kg.   Intake/Output      05/13 0701 - 05/14 0700 05/14 0701 - 05/15 0700   P.O. 360 120   Total Intake(mL/kg) 360 (6.3) 120 (2.1)   Urine (mL/kg/hr)     Total Output     Net +360 +120        Urine Occurrence 1 x    Stool Occurrence 1 x      LABS  No results found for this or any previous visit (from the past 24 hour(s)).   PHYSICAL EXAM:   Gen: sitting up in bed, very pleasant, NAD  Lungs: unlabored Cardiac: reg Ext:   Right Upper Extremity     Splint clean, dry and intact   Splint removed   Incision volar wrist looks excellent    Swelling very well controlled   Some skin irritation noted along ulnar aspect of hand    Motor and sensory functions intact   Ext warm    Ecchymosis improving    Elbow, shoulder unremarkable      Left Upper Extremity    No swelling L wrist   No appreciable ecchymosis      No gross deformity    + TTP distal radius   Motor and sensory functions intact   + radial pulse   Good perfusion distally    Elbow, shoulder nontender    Right Lower Extremity    Dressings R hip and thigh stable   All incisions look great   Ext warm    + DP pulse   Swelling well controlled   Motor and sensory functions intact    No pain with passive stretch of lower leg   Compartments are soft    No DCT   Assessment/Plan: 11 Days Post-Op   Principal Problem:   Intertrochanteric fracture of right hip (HCC) Active Problems:   Hypertension   Closed fracture of right distal radius   Closed fracture of left distal radius   Anti-infectives (From admission, onward)   Start     Dose/Rate Route Frequency Ordered Stop   04/19/20 0731  ceFAZolin (ANCEF) 2-4 GM/100ML-% IVPB    Note to Pharmacy: Henrine Screws   : cabinet override      04/19/20 0731 04/19/20 0848   04/19/20 0730  ceFAZolin (ANCEF) IVPB 2g/100 mL premix     2 g 200 mL/hr over 30 Minutes Intravenous On call to O.R. 04/19/20 [id number] 04/19/20 0906    .  POD/HD#: 47  86 y/o RHD female s/p fall with R hip fracture and R distal radius fracture    - fall   - R periimplant R intertrochanteric hip fracture s/p ROH and IMN R hip              WBAT R leg             ROM as tolerated R hip and knee             PT/OT             Ice and elevate             Increase activity slowly              Dressings changed today and as needed. Suture removal today                          Ok to change as needed                         Can leave open to the air                          Ok to shower and clean with soap and water    - R distal radius fracture s/p ORIF             NWB thru wrist              Ok to WB thru elbow (can use platform walker)             Digit, elbow, shoulder motion as tolerated             Ice and elevate             dc splint today  Transition to removable long wrist brace   To be treated like a cast     Can come out of it for hygiene              Therapies    - Left distal radius fracture  Non-op  Placed into a cast    Fracture lines not clearly visible on 5/1 film.  Read as negative and clinical exam did indicate acute fracture    5/13 films clearly show fracture    Will keep this cast in place for 2-3 weeks then place new cast.  Anticipate total cast time of 6 weeks  Ice and elevate as needed  Ok to move digits    NWB through wrist, WB thru elbow    - Pain management:             Continue with current regimen    - ABL anemia/Hemodynamics             Monitor, remains asymptomatic              stable    - Medical issues              Per primary    -  DVT/PE prophylaxis:             Lovenox x 28 days (17 more days)   - ID:              periop abx completed    - Metabolic Bone Disease:      Osteoporosis              Vitamin d levels look good             Meets criteria for fracture liaison service consult             Recommend dexa in 4-8 weeks   - Activity:             Up with assistance             As above   - FEN/GI prophylaxis/Foley/Lines:             Reg diet   - Impediments to fracture healing:             Poor bone quality    - Dispo:            follow up with ortho in 14 days   Jari Pigg, PA-C [phone] (C) 04/30/2020, 10:44 AM  [facility] [place] [phone] Domingo Sep (F)

## 2020-04-30 NOTE — Plan of Care (Signed)
  Problem: Activity: Goal: Ability to ambulate and perform ADLs will improve Outcome: Progressing   Problem: Self-Concept: Goal: Ability to maintain and perform role responsibilities to the fullest extent possible will improve Outcome: Progressing   Problem: Pain Management: Goal: Pain level will decrease Outcome: Progressing   Problem: Education: Goal: Knowledge of General Education information will improve Description: Including pain rating scale, medication(s)/side effects and non-pharmacologic comfort measures Outcome: Progressing   Problem: Clinical Measurements: Goal: Ability to maintain clinical measurements within normal limits will improve Outcome: Progressing   Problem: Clinical Measurements: Goal: Respiratory complications will improve Outcome: Progressing   Problem: Elimination: Goal: Will not experience complications related to bowel motility Outcome: Progressing   Problem: Elimination: Goal: Will not experience complications related to urinary retention Outcome: Progressing   Problem: Pain Managment: Goal: General experience of comfort will improve Outcome: Progressing   Problem: Safety: Goal: Ability to remain free from injury will improve Outcome: Progressing   Problem: Skin Integrity: Goal: Risk for impaired skin integrity will decrease Outcome: Progressing

## 2020-04-30 NOTE — Progress Notes (Signed)
Physical Therapy Treatment Patient Details Name: Savannah Coleman MRN: [id number] DOB: March 12, 1931 Today's Date: 04/30/2020    History of Present Illness Savannah Coleman is a 86 y.o. female with medical history significant of HTN, prior R femur fx in 2015. Pt presents to ED after she slipped and fell in shower. S/p right hip IM nail and right wrist ORIF.  Pt with increasing complaints of L wrist pain, xray shows fracture and ortho plans to treat non-operatively with cast applied 04/30/20.    PT Comments    Pt supine in bed on arrival this session.  Pt reports pain in L wrist and R hip and knee.  Pt with new weight bearing restriction based on new fracture in L wrist.  She required moderate assistance to mobilize this session.  Based on increased difficulty to mobilize given new restriction she continues to benefit from aggressive rehab in a post acute setting to improve strength and function before returning home.  Pt remains in good spirits and very motivated.     Follow Up Recommendations  CIR     Equipment Recommendations  Rolling walker with 5" wheels;Hospital bed;3in1 (PT)(w/ B platform attachments.)    Recommendations for Other Services       Precautions / Restrictions Precautions Precautions: Fall Precaution Comments: pt fell in shower  Restrictions Weight Bearing Restrictions: Yes RUE Weight Bearing: Non weight bearing RLE Weight Bearing: Non weight bearing Other Position/Activity Restrictions: weight bearing through B elbows okay    Mobility  Bed Mobility Overal bed mobility: Needs Assistance Bed Mobility: Supine to Sit     Supine to sit: Mod assist     General bed mobility comments: Increased assistance to move RLE and advance hip to edge of bed as she is unable to push through B UEs.  Transfers Overall transfer level: Needs assistance Equipment used: Bilateral platform walker Transfers: Sit to/from Stand Sit to Stand: Mod assist         General transfer  comment: Pt required assistance to boost into standing.  She required increased assistance as she is unable to push with UEs into standing.  Mod assistance to lower to seated surface.  Ambulation/Gait Ambulation/Gait assistance: Min guard;Min assist Gait Distance (Feet): 80 Feet Assistive device: Bilateral platform walker Gait Pattern/deviations: Decreased stride length;Trunk flexed;Antalgic;Drifts right/left     General Gait Details: Pt drifting to teh R.  Required assistance to negotiate obstacles and to turn RW.  Pt fatigues with increased activity and required chair to be brought to her.   Stairs             Wheelchair Mobility    Modified Rankin (Stroke Patients Only)       Balance Overall balance assessment: Needs assistance Sitting-balance support: No upper extremity supported;Feet supported Sitting balance-Leahy Scale: Fair       Standing balance-Leahy Scale: Poor                              Cognition Arousal/Alertness: Awake/alert Behavior During Therapy: WFL for tasks assessed/performed Overall Cognitive Status: Impaired/Different from baseline Area of Impairment: Memory;Safety/judgement                     Memory: Decreased recall of precautions;Decreased short-term memory   Safety/Judgement: Decreased awareness of deficits;Decreased awareness of safety     General Comments: patient requires cueing to adhere to elbow weight bearing to B UE, cueing for safety  Exercises      General Comments        Pertinent Vitals/Pain Pain Assessment: 0-10 Pain Score: 6  Pain Location: L wrist> R LE Pain Descriptors / Indicators: Grimacing;Guarding Pain Intervention(s): Monitored during session;Repositioned    Home Living                      Prior Function            PT Goals (current goals can now be found in the care plan section) Acute Rehab PT Goals Patient Stated Goal: to get stronger Potential to Achieve  Goals: Good Progress towards PT goals: Progressing toward goals    Frequency    Min 4X/week      PT Plan Current plan remains appropriate    Co-evaluation              AM-PAC PT "6 Clicks" Mobility   Outcome Measure  Help needed turning from your back to your side while in a flat bed without using bedrails?: A Lot Help needed moving from lying on your back to sitting on the side of a flat bed without using bedrails?: A Lot Help needed moving to and from a bed to a chair (including a wheelchair)?: A Lot Help needed standing up from a chair using your arms (e.g., wheelchair or bedside chair)?: A Lot Help needed to walk in hospital room?: A Lot Help needed climbing 3-5 steps with a railing? : A Lot 6 Click Score: 12    End of Session Equipment Utilized During Treatment: Gait belt Activity Tolerance: Patient limited by pain;Patient limited by fatigue Patient left: in chair;with call bell/phone within reach;with chair alarm set Nurse Communication: Mobility status(informed nursing of wrist patient bands missing after cast application and need for batteries in her chair alarm.) PT Visit Diagnosis: Unsteadiness on feet (R26.81);History of falling (Z91.81);Difficulty in walking, not elsewhere classified (R26.2);Pain Pain - Right/Left: Right(also L wrist) Pain - part of body: Arm;Hip     Time: [id number] PT Time Calculation (min) (ACUTE ONLY): 41 min  Charges:  $Gait Training: 23-37 mins $Therapeutic Activity: 8-22 mins                     Erasmo Leventhal , PTA Acute Rehabilitation Services Pager [phone] Office [phone] ,   Cristela Blue 04/30/2020, 11:54 AM

## 2020-05-01 DIAGNOSIS — E785 Hyperlipidemia, unspecified: Secondary | ICD-10-CM

## 2020-05-01 MED ORDER — POLYETHYLENE GLYCOL 3350 17 G PO PACK: 17.0000 g | PACK | Freq: Every day | ORAL | 0 refills | Status: AC

## 2020-05-01 MED ORDER — FERROUS SULFATE 325 (65 FE) MG PO TABS: 325.0000 mg | ORAL_TABLET | Freq: Every day | ORAL | 1 refills | Status: DC

## 2020-05-01 MED ORDER — ACETAMINOPHEN 500 MG PO TABS: 500.0000 mg | ORAL_TABLET | Freq: Two times a day (BID) | ORAL | 0 refills | Status: DC

## 2020-05-01 MED ORDER — ASCORBIC ACID 500 MG PO TABS: 500.0000 mg | ORAL_TABLET | Freq: Every day | ORAL | 0 refills | Status: AC

## 2020-05-01 MED ORDER — DOCUSATE SODIUM 100 MG PO CAPS: 100.0000 mg | ORAL_CAPSULE | Freq: Two times a day (BID) | ORAL | 0 refills | Status: DC

## 2020-05-01 NOTE — TOC Transition Note (Signed)
Transition of Care [facility]) - CM/SW Discharge Note   Patient Details  Name: Savannah Coleman MRN: [id number] Date of Birth: June 01, 1931  [facility]) CM/SW Contact:  Bary Castilla, LCSW Phone Number: [phone] 05/01/2020, 10:57 AM   Clinical Narrative:    Patient will DC to:?[place] date:?05/01/20 Family notified:?[facility] Transport by: Corey Harold   Per MD patient ready for DC to [facility]. RN, patient, patient's family, and facility notified of DC. Discharge Summary sent to facility. RN given number for report to call report to  [phone] or [phone].  DC packet on chart. Ambulance transport requested for patient.   CSW signing off.   [place], [place] [phone]      Barriers to Discharge: Other (comment)(insurance denied IPR, MD peer to peer in process)   Patient Goals and CMS Choice Patient states their goals for this hospitalization and ongoing recovery are:: Want to get better and get home CMS Medicare.gov Compare Post Acute Care list provided to:: Patient    Discharge Placement                       Discharge Plan and Services                                     Social Determinants of Health (SDOH) Interventions     Readmission Risk Interventions No flowsheet data found.

## 2020-05-01 NOTE — Progress Notes (Signed)
Physical Therapy Treatment Patient Details Name: Savannah Coleman MRN: [id number] DOB: [birth year] Today's Date: 05/01/2020    History of Present Illness Savannah Coleman is a 86 y.o. female with medical history significant of HTN, prior R femur fx in 2015. Pt presents to ED after she slipped and fell in shower. S/p right hip IM nail and right wrist ORIF.  Pt with increasing complaints of L wrist pain, xray shows fracture and ortho plans to treat non-operatively with cast applied 04/30/20.    PT Comments    Pt OOB ambulating with NT upon arrival of PT, agreeable to session with focus on progressing mobility prior to planned d/c to rehab later today. The pt was able to demo improved tolerance for RW management (bilateral platform RW), with no need for assist in management. The pt took x2 standing rest breaks due to pain from LUE cast during ambulation. The pt continues to need assist for transfers due to inability to bear wt through either UE, and will continue to benefit from skilled PT to address significant deficits in power, endurance, stability, and independence with mobility.     Follow Up Recommendations  CIR;Supervision/Assistance - 24 hour     Equipment Recommendations  Rolling walker with 5" wheels;Hospital bed;3in1 (PT)(with B platform attachments)    Recommendations for Other Services       Precautions / Restrictions Precautions Precautions: Fall Precaution Comments: pt fell in shower  Required Braces or Orthoses: Other Brace Other Brace: cast LUE, splint RUE Restrictions Weight Bearing Restrictions: Yes RUE Weight Bearing: Non weight bearing LUE Weight Bearing: Non weight bearing RLE Weight Bearing: Weight bearing as tolerated Other Position/Activity Restrictions: NWB BUE, can WB throught elbows, WBAT RLE    Mobility  Bed Mobility Overal bed mobility: Needs Assistance             General bed mobility comments: OOB to recliner at start and end of  session  Transfers Overall transfer level: Needs assistance Equipment used: Bilateral platform walker Transfers: Sit to/from Stand Sit to Stand: Min assist;Mod assist         General transfer comment: minA to power up, especially due to NWB BUE, modA to lower  Ambulation/Gait Ambulation/Gait assistance: Min guard Gait Distance (Feet): 60 Feet Assistive device: Bilateral platform walker Gait Pattern/deviations: Decreased stride length;Trunk flexed;Antalgic;Drifts right/left Gait velocity: 0.2 m/s Gait velocity interpretation: <1.8 ft/sec, indicate of risk for recurrent falls General Gait Details: pt able to demo good control of platform RW. X2 standing rest break   Stairs             Wheelchair Mobility    Modified Rankin (Stroke Patients Only)       Balance Overall balance assessment: Needs assistance Sitting-balance support: No upper extremity supported;Feet supported Sitting balance-Leahy Scale: Fair Sitting balance - Comments: supervision at EOB    Standing balance support: Bilateral upper extremity supported;During functional activity;Single extremity supported Standing balance-Leahy Scale: Poor Standing balance comment: relies on platform RW dynamically, can engage in ADLs with 1 UE support                            Cognition Arousal/Alertness: Awake/alert Behavior During Therapy: WFL for tasks assessed/performed Overall Cognitive Status: Impaired/Different from baseline Area of Impairment: Memory;Safety/judgement                     Memory: Decreased recall of precautions;Decreased short-term memory   Safety/Judgement: Decreased awareness of deficits;Decreased  awareness of safety     General Comments: patient requires cueing to adhere to elbow weight bearing to B UE, cueing for safety      Exercises      General Comments General comments (skin integrity, edema, etc.): VSS on RA      Pertinent Vitals/Pain Pain  Assessment: Faces Faces Pain Scale: Hurts a little bit Pain Location: L wrist> R LE Pain Descriptors / Indicators: Grimacing;Guarding Pain Intervention(s): Limited activity within patient's tolerance;Repositioned    Home Living                      Prior Function            PT Goals (current goals can now be found in the care plan section) Acute Rehab PT Goals Patient Stated Goal: to get stronger PT Goal Formulation: With patient Time For Goal Achievement: 05/04/20 Potential to Achieve Goals: Good Progress towards PT goals: Progressing toward goals    Frequency    Min 4X/week      PT Plan Current plan remains appropriate    Co-evaluation              AM-PAC PT "6 Clicks" Mobility   Outcome Measure  Help needed turning from your back to your side while in a flat bed without using bedrails?: A Lot Help needed moving from lying on your back to sitting on the side of a flat bed without using bedrails?: A Lot Help needed moving to and from a bed to a chair (including a wheelchair)?: A Little Help needed standing up from a chair using your arms (e.g., wheelchair or bedside chair)?: A Lot Help needed to walk in hospital room?: A Little Help needed climbing 3-5 steps with a railing? : A Lot 6 Click Score: 14    End of Session Equipment Utilized During Treatment: Gait belt(bilateral platform RW) Activity Tolerance: Patient limited by pain;Patient limited by fatigue Patient left: in chair;with call bell/phone within reach;with family/visitor present Nurse Communication: Mobility status PT Visit Diagnosis: Unsteadiness on feet (R26.81);History of falling (Z91.81);Difficulty in walking, not elsewhere classified (R26.2);Pain Pain - Right/Left: Right(and L wrist) Pain - part of body: Arm;Hip     Time: [id number] PT Time Calculation (min) (ACUTE ONLY): 18 min  Charges:  $Gait Training: 8-22 mins                     Karma Ganja, PT, DPT   Acute Rehabilitation  Department Pager #: [phone]   Otho Bellows 05/01/2020, 11:49 AM

## 2020-05-01 NOTE — Plan of Care (Signed)
  Problem: Activity: Goal: Ability to ambulate and perform ADLs will improve Outcome: Progressing   Problem: Self-Concept: Goal: Ability to maintain and perform role responsibilities to the fullest extent possible will improve Outcome: Progressing   Problem: Pain Management: Goal: Pain level will decrease Outcome: Progressing   

## 2020-05-01 NOTE — Progress Notes (Signed)
Received a call from [name] at [facility] that patient had arrived. Report given to [name] at 1310. Savannah Coleman

## 2020-05-01 NOTE — Discharge Summary (Signed)
Discharge Summary  Savannah Coleman [id number] DOB: October 17, 1931  PCP: Jonathon Resides, MD  Admit date: 04/17/2020 Discharge date: 05/01/2020  Time spent: 40 mins  Recommendations for Outpatient Follow-up:  1. PCP in 1 week 2. Orthopedic surgeon as scheduled  Discharge Diagnoses:  Active Hospital Problems   Diagnosis Date Noted  . Intertrochanteric fracture of right hip ([place]) 04/17/2020  . Closed fracture of left distal radius   . Closed fracture of right distal radius 04/17/2020  . Hypertension     Resolved Hospital Problems   Diagnosis Date Noted Date Resolved  . Fracture of right wrist 04/17/2020 04/17/2020    Discharge Condition: Stable  Diet recommendation: As tolerated  Vitals:   05/01/20 0503 05/01/20 0803  BP: (!) 145/71 140/73  Pulse: 78 72  Resp: 14 17  Temp: 98 F (36.7 C) 98.3 F (36.8 C)  SpO2: 97% 99%    History of present illness:  86 y.o.femalewith medical history significant ofHTN, prior R femur fx in 2015. Pt presents to ED after she slipped and fell in shower. Pain to both wrists and R femur. Unable to bear wait, pain worse with movement. Couldn't bend or walk R leg. In the ED, noted a R wrist fx, and a much more complicated R perihardware lesser trochanter fracture of femur. S/P surgery on wrist and hip.   Today, pt denies any new complaints, eager to be discharged to inpatient rehab at [facility]. Pt denies any chest pain, SOB, abdominal pain, N/V, fever/chills.  Hospital Course:  Principal Problem:   Intertrochanteric fracture of right hip [facility]) Active Problems:   Hypertension   Closed fracture of right distal radius   Closed fracture of left distal radius   Right periprosthetic intertrochanteric hip fracture s/p surgery on 04/19/2020 S/p mechanical fall X-ray with avulsion fx of the lesser trochanter of the R femur at the level of the proximal threaded screw CT head/cervical spine showed no acute fracture or intracranial  abnormality Pain management, DVT PPx (lovenox for 28 days-till 05/21/20) as per orthopedics Follow up with Orthopedics as an outpt as scheduled   Right wrist fracture s/p ORIF on 04/19/2020 Left distal radius fracture visible on 5/13 film after pt c/o worsening pain X-ray showed mildly displaced transverse fracture of the right radial head Orthopedics on board, cont to follow recs, Left cast applied by ortho on 04/30/20- total cast time 6 weeks (no surgical intervention) Orthopedics outpt follow up  Macrocytic anemia/post opanemia Anemia panel unremarkable No evidence of acute bleed at this time Continue PO iron and vit C  Hypertension Continue home amlodipine  Hyperlipidemia Continue home statin  GERD Continue PPI  Constipation Bowel regimen         Malnutrition Type:  Nutrition Problem: Increased nutrient needs Etiology: post-op healing   Malnutrition Characteristics:  Signs/Symptoms: estimated needs   Nutrition Interventions:  Interventions: MVI, Ensure Enlive (each supplement provides 350kcal and 20 grams of protein)   Estimated body mass index is 23.05 kg/m as calculated from the following:   Height as of this encounter: 5\' 2"  (1.575 m).   Weight as of this encounter: 57.2 kg.    Procedures:  As mentioned above   Consultations:  Orthopedic surgery  Discharge Exam: BP 140/73 (BP Location: Right Arm)   Pulse 72   Temp 98.3 F (36.8 C) (Oral)   Resp 17   Ht 5\' 2"  (1.575 m)   Wt 57.2 kg   SpO2 99%   BMI 23.05 kg/m   General: NAD  Cardiovascular: S1, S2 present Respiratory: CTAB Ext: LUE cast noted  Discharge Instructions You were cared for by a hospitalist during your hospital stay. If you have any questions about your discharge medications or the care you received while you were in the hospital after you are discharged, you can call the unit and asked to speak with the hospitalist on call if the hospitalist that took care of you is  not available. Once you are discharged, your primary care physician will handle any further medical issues. Please note that NO REFILLS for any discharge medications will be authorized once you are discharged, as it is imperative that you return to your primary care physician (or establish a relationship with a primary care physician if you do not have one) for your aftercare needs so that they can reassess your need for medications and monitor your lab values.  Discharge Instructions    Diet - low sodium heart healthy   Complete by: As directed    Increase activity slowly   Complete by: As directed      Allergies as of 05/01/2020   No Known Allergies     Medication List    TAKE these medications   acetaminophen 500 MG tablet Commonly known as: TYLENOL Take 1 tablet (500 mg total) by mouth every 12 (twelve) hours.   amLODipine 5 MG tablet Commonly known as: NORVASC Take 5 mg by mouth daily.   ascorbic acid 500 MG tablet Commonly known as: VITAMIN C Take 1 tablet (500 mg total) by mouth daily. What changed:   medication strength  how much to take  when to take this   BIOTIN PO Take 1 tablet by mouth daily with lunch.   CALCIUM+D3 PO Take 1 tablet by mouth daily with lunch.   diphenhydramine-acetaminophen 25-500 MG Tabs tablet Commonly known as: TYLENOL PM Take 1 tablet by mouth at bedtime as needed (sleep).   docusate sodium 100 MG capsule Commonly known as: COLACE Take 1 capsule (100 mg total) by mouth 2 (two) times daily.   enoxaparin 40 MG/0.4ML injection Commonly known as: LOVENOX Inject 0.4 mLs (40 mg total) into the skin daily for 28 days.   ferrous sulfate 325 (65 FE) MG tablet Take 1 tablet (325 mg total) by mouth daily with breakfast.   HYDROcodone-acetaminophen 5-325 MG tablet Commonly known as: NORCO/VICODIN Take 1-2 tablets by mouth every 6 (six) hours as needed for moderate pain or severe pain.   multivitamin with minerals Tabs tablet Take 1  tablet by mouth daily with lunch.   pantoprazole 40 MG tablet Commonly known as: PROTONIX Take 40 mg by mouth daily.   polyethylene glycol 17 g packet Commonly known as: MIRALAX / GLYCOLAX Take 17 g by mouth daily. Start taking on: May 02, 2020   POTASSIUM PO Take 1 tablet by mouth daily with lunch.   simvastatin 20 MG tablet Commonly known as: ZOCOR Take 20 mg by mouth at bedtime.   THERATEARS OP Place 1 drop into both eyes daily.   VITAMIN D PO Take 1 tablet by mouth daily with lunch.      No Known Allergies  Contact information for follow-up providers    Altamese [place], MD. Schedule an appointment as soon as possible for a visit on 05/05/2020.   Specialty: Orthopedic Surgery Contact information: [place] [address] [phone]        Jonathon Resides, MD. Schedule an appointment as soon as possible for a visit in 1 week(s).  Specialty: Family Medicine Contact information: [place] [address] [phone]            Contact information for after-discharge care    [facility] .   Service: Skilled Nursing Contact information: [place] [phone]                   The results of significant diagnostics from this hospitalization (including imaging, microbiology, ancillary and laboratory) are listed below for reference.    Significant Diagnostic Studies: DG Wrist 2 Views Right  Result Date: 04/19/2020 CLINICAL DATA:  Operative fixation of a right distal radius fracture. EXAM: RIGHT WRIST - 2 VIEW COMPARISON:  04/17/2020 FINDINGS: 5 C-arm views of the right wrist demonstrate screw plate fixation of the previously demonstrated distal radial metaphysis fracture. Anatomic position and alignment on these views. IMPRESSION: Hardware fixation of the previously demonstrated distal radial metaphysis fracture. Electronically Signed   By: Claudie Revering M.D.   On: 04/19/2020 12:57   DG Wrist Complete Left  Result Date: 04/30/2020 CLINICAL DATA:  Status post casting and immobilization left radial fracture. EXAM: LEFT WRIST - COMPLETE 3+ VIEW COMPARISON:  Apr 29, 2020. FINDINGS: The left wrist has been casted and immobilized. Distal left radial fracture appears to be stable in position. IMPRESSION: Status post casting and immobilization of distal left radial fracture. Electronically Signed   By: Marijo Conception M.D.   On: 04/30/2020 12:42   DG Wrist Complete Left  Result Date: 04/29/2020 CLINICAL DATA:  Left wrist pain after fall EXAM: LEFT WRIST - COMPLETE 3+ VIEW COMPARISON:  04/17/2020 FINDINGS: There is a left distal radial metaphyseal fracture. Mild impaction and posterior angulation. No subluxation or dislocation. Advanced degenerative changes in the carpal region. IMPRESSION: Distal left radial metaphyseal fracture with impaction and posterior angulation. Electronically Signed   By: Rolm Baptise M.D.   On: 04/29/2020 18:35   DG Wrist Complete Left  Result Date: 04/17/2020 CLINICAL DATA:  Slip and fall, pain EXAM: LEFT WRIST - COMPLETE 3+ VIEW COMPARISON:  None. FINDINGS: Osteopenia. No fracture or dislocation of the left wrist. The carpus is normally aligned. Generally mild arthrosis. Soft tissues are unremarkable. IMPRESSION: Osteopenia. No fracture or dislocation of the left wrist. The carpus is normally aligned. Generally mild arthrosis. Electronically Signed   By: Eddie Candle M.D.   On: 04/17/2020 16:13   DG Wrist Complete Right  Result Date: 04/19/2020 CLINICAL DATA:  Postop EXAM: RIGHT WRIST - COMPLETE 3+ VIEW COMPARISON:  04/17/2020 FINDINGS: Plate and screw fixation across distal right radial fracture. Anatomic alignment. No complicating feature. IMPRESSION: Internal fixation.  No visible complicating feature. Electronically Signed   By: Rolm Baptise M.D.   On: 04/19/2020 22:01   DG Wrist Complete Right  Result Date:  04/17/2020 CLINICAL DATA:  Fall at home.  Complaining of wrist pain. EXAM: RIGHT WRIST - COMPLETE 3+ VIEW COMPARISON:  None. FINDINGS: Osteopenia. There is a mildly displaced transverse fracture at the radial head best appreciated on the lateral view. There are degenerative changes in the carpal bones. No additional acute finding identified. No evidence of dislocation. There is regional soft tissue swelling. IMPRESSION: Mildly displaced transverse fracture at the right radial head. Electronically Signed   By: Audie Pinto M.D.   On: 04/17/2020 16:06   CT Head Wo Contrast  Result Date: 04/17/2020 CLINICAL DATA:  Acute pain due to trauma. Headache. Laceration to  the right forehead status post fall. EXAM: CT HEAD WITHOUT CONTRAST TECHNIQUE: Contiguous axial images were obtained from the base of the skull through the vertex without intravenous contrast. COMPARISON:  None. FINDINGS: Brain: No evidence of acute infarction, hemorrhage, hydrocephalus, extra-axial collection or mass lesion/mass effect. Atrophy and chronic microvascular ischemic changes are noted. Vascular: No hyperdense vessel or unexpected calcification. Skull: Normal. Negative for fracture or focal lesion. Sinuses/Orbits: No acute finding. Other: None. IMPRESSION: 1. No acute intracranial abnormality. 2. Atrophy and chronic microvascular ischemic changes. Electronically Signed   By: Constance Holster M.D.   On: 04/17/2020 18:27   CT Cervical Spine Wo Contrast  Result Date: 04/17/2020 CLINICAL DATA:  Acute pain due to trauma EXAM: CT CERVICAL SPINE WITHOUT CONTRAST TECHNIQUE: Multidetector CT imaging of the cervical spine was performed without intravenous contrast. Multiplanar CT image reconstructions were also generated. COMPARISON:  None. FINDINGS: Alignment: Normal. Skull base and vertebrae: No acute fracture. No primary bone lesion or focal pathologic process. Soft tissues and spinal canal: No prevertebral fluid or swelling. No visible canal  hematoma. Disc levels: Mild multilevel degenerative changes are noted throughout the cervical spine. These are greatest at the C6-C7 level. Upper chest: Negative. Other: None. IMPRESSION: 1. No acute fracture or subluxation of the cervical spine. 2. Mild multilevel degenerative changes. Electronically Signed   By: Constance Holster M.D.   On: 04/17/2020 18:34   Chest Portable 1 View  Result Date: 04/17/2020 CLINICAL DATA:  Preoperative evaluation, former smoker EXAM: PORTABLE CHEST 1 VIEW COMPARISON:  Portable exam 2019 hours compared to 12/12/2015 FINDINGS: Normal heart size and pulmonary vascularity. Tortuous thoracic aorta. Emphysematous and bronchitic changes likely reflecting COPD. No acute infiltrate, pleural effusion or pneumothorax. Bones demineralized. IMPRESSION: Bronchitic and emphysematous likely reflecting COPD. No acute abnormalities. Electronically Signed   By: Lavonia Dana M.D.   On: 04/17/2020 20:32   DG C-Arm 1-60 Min  Result Date: 04/19/2020 CLINICAL DATA:  Proximal right femur fracture following a fall. EXAM: RIGHT FEMUR 2 VIEWS; DG C-ARM 1-60 MIN COMPARISON:  04/17/2020 FINDINGS: The previously demonstrated intramedullary rod and screws have been replaced with a new rod, screws and compression screw. These are bridging an intertrochanteric fracture in essentially anatomic position and alignment. A medially displaced lesser trochanter fragment is again demonstrated. IMPRESSION: Hardware fixation of an intertrochanteric fracture in essentially anatomic position and alignment. Electronically Signed   By: Claudie Revering M.D.   On: 04/19/2020 12:45   DG HIP UNILAT WITH PELVIS 1V RIGHT  Result Date: 04/19/2020 CLINICAL DATA:  Postop EXAM: DG HIP (WITH OR WITHOUT PELVIS) 1V RIGHT COMPARISON:  04/20/2019 FINDINGS: Intramedullary nail and hip screw in place. This crosses the right femoral intertrochanteric fracture. Displaced lesser trochanter. No hardware complicating feature. IMPRESSION:  Internal fixation.  No hardware complicating feature. Electronically Signed   By: Rolm Baptise M.D.   On: 04/19/2020 21:58   DG Hip Unilat  With Pelvis 2-3 Views Right  Result Date: 04/17/2020 CLINICAL DATA:  Fall at home. Right pain hip which shortening and rotation. EXAM: DG HIP (WITH OR WITHOUT PELVIS) 2-3V RIGHT COMPARISON:  Right hip radiographs 05/01/2014 FINDINGS: Partially visualized intramedullary rod in the right femur with proximal threaded screw. There is an avulsion fracture of the lesser trochanter which is new from the 2015 radiograph. There is no evidence of hip dislocation. There are bilateral degenerative changes of the hip. No additional acute finding in the visualized pelvis. Lower lumbar degenerative changes. IMPRESSION: Avulsion fracture of the lesser trochanter of the  right femur at the level of the proximal threaded screw. Electronically Signed   By: Audie Pinto M.D.   On: 04/17/2020 16:03   DG FEMUR, MIN 2 VIEWS RIGHT  Result Date: 04/19/2020 CLINICAL DATA:  Postop EXAM: RIGHT FEMUR 2 VIEWS COMPARISON:  None. FINDINGS: Intramedullary nail and dynamic hip screw in place. This crosses the right femoral intertrochanteric fracture. No hardware complicating feature. Prior right knee replacement. IMPRESSION: Internal fixation.  No visible complicating feature. Electronically Signed   By: Rolm Baptise M.D.   On: 04/19/2020 21:59   DG FEMUR, MIN 2 VIEWS RIGHT  Result Date: 04/19/2020 CLINICAL DATA:  Proximal right femur fracture following a fall. EXAM: RIGHT FEMUR 2 VIEWS; DG C-ARM 1-60 MIN COMPARISON:  04/17/2020 FINDINGS: The previously demonstrated intramedullary rod and screws have been replaced with a new rod, screws and compression screw. These are bridging an intertrochanteric fracture in essentially anatomic position and alignment. A medially displaced lesser trochanter fragment is again demonstrated. IMPRESSION: Hardware fixation of an intertrochanteric fracture in  essentially anatomic position and alignment. Electronically Signed   By: Claudie Revering M.D.   On: 04/19/2020 12:45    Microbiology: No results found for this or any previous visit (from the past 240 hour(s)).   Labs: Basic Metabolic Panel: Recent Labs  Lab 04/26/20 0454 04/29/20 0420  NA 139 138  K 3.4* 3.8  CL 101 103  CO2 29 26  GLUCOSE 102* 97  BUN 10 11  CREATININE 0.66 0.73  CALCIUM 9.2 9.2   Liver Function Tests: Recent Labs  Lab 04/26/20 0454  AST 31  ALT 30  ALKPHOS 68  BILITOT 1.0  PROT 5.3*  ALBUMIN 2.6*   No results for input(s): LIPASE, AMYLASE in the last 168 hours. No results for input(s): AMMONIA in the last 168 hours. CBC: Recent Labs  Lab 04/25/20 0442 04/26/20 0454 04/27/20 0547 04/29/20 0420  WBC 8.0 7.9 9.4 9.9  NEUTROABS 4.6 5.2 6.0 6.6  HGB 7.8* 7.9* 8.4* 8.4*  HCT 24.2* 25.5* 26.7* 26.6*  MCV 103.9* 102.8* 103.9* 104.3*  PLT 320 381 412* 477*   Cardiac Enzymes: No results for input(s): CKTOTAL, CKMB, CKMBINDEX, TROPONINI in the last 168 hours. BNP: BNP (last 3 results) No results for input(s): BNP in the last 8760 hours.  ProBNP (last 3 results) No results for input(s): PROBNP in the last 8760 hours.  CBG: No results for input(s): GLUCAP in the last 168 hours.     Signed:  Alma Friendly, MD [facility] 05/01/2020, 10:42 AM

## 2020-05-01 NOTE — Plan of Care (Signed)
  Problem: Health Behavior/Discharge Planning: Goal: Ability to manage health-related needs will improve Outcome: Adequate for Discharge   Problem: Activity: Goal: Ability to ambulate and perform ADLs will improve Outcome: Completed/Met   Problem: Self-Concept: Goal: Ability to maintain and perform role responsibilities to the fullest extent possible will improve Outcome: Completed/Met   Problem: Pain Management: Goal: Pain level will decrease Outcome: Completed/Met   Problem: Education: Goal: Knowledge of General Education information will improve Description: Including pain rating scale, medication(s)/side effects and non-pharmacologic comfort measures Outcome: Completed/Met   Problem: Clinical Measurements: Goal: Ability to maintain clinical measurements within normal limits will improve Outcome: Completed/Met Goal: Will remain free from infection Outcome: Completed/Met Goal: Diagnostic test results will improve Outcome: Completed/Met Goal: Cardiovascular complication will be avoided Outcome: Completed/Met   Problem: Activity: Goal: Risk for activity intolerance will decrease Outcome: Completed/Met   Problem: Nutrition: Goal: Adequate nutrition will be maintained Outcome: Completed/Met   Problem: Coping: Goal: Level of anxiety will decrease Outcome: Completed/Met   Problem: Elimination: Goal: Will not experience complications related to bowel motility Outcome: Completed/Met Goal: Will not experience complications related to urinary retention Outcome: Completed/Met   Problem: Pain Managment: Goal: General experience of comfort will improve Outcome: Completed/Met   Problem: Safety: Goal: Ability to remain free from injury will improve Outcome: Completed/Met   Problem: Skin Integrity: Goal: Risk for impaired skin integrity will decrease Outcome: Completed/Met

## 2020-05-01 NOTE — Progress Notes (Signed)
Patient discharged to [facility] via [place]. Daughter transported belongings. Cell phone kept with patient for transfer. Attempted to call report to [facility] x 3 at provided numbers with no answer and no option to leave a message. Massie Bougie, RN

## 2020-05-02 NOTE — Op Note (Signed)
ORTHO  DX: ANGULATED LEFT COLLES FRACTURE  PROCEDURE: CLOSED REDUCTION AND CASTING LEFT DISTAL RADIUS   Patient's second set of x-rays demonstrated new fracture with dorsal tilt. After discussion of risks and benefits of closed treatment with the daughter, she agreed to recommendation for reduction and casting. Patient tolerated procedure well.  The service was rendered under my overall direction and control, and I was immediately available via phone/ pager or present on site. We will follow up x-rays.  Altamese Gurabo, MD [facility] [phone] (p)

## 2020-07-13 ENCOUNTER — Emergency Department (HOSPITAL_COMMUNITY)
Admission: EM | Admit: 2020-07-13 | Discharge: 2020-07-13 | Disposition: A | Discharge status: Home / Self Care | Payer: Medicare HMO | Attending: Emergency Medicine | Admitting: Emergency Medicine

## 2020-07-13 ENCOUNTER — Emergency Department (HOSPITAL_COMMUNITY): Payer: Medicare HMO

## 2020-07-13 ENCOUNTER — Encounter (HOSPITAL_COMMUNITY): Payer: Self-pay | Admitting: Emergency Medicine

## 2020-07-13 DIAGNOSIS — I10 Essential (primary) hypertension: Secondary | ICD-10-CM | POA: Diagnosis not present

## 2020-07-13 DIAGNOSIS — Z79899 Other long term (current) drug therapy: Secondary | ICD-10-CM | POA: Diagnosis not present

## 2020-07-13 DIAGNOSIS — M545 Low back pain, unspecified: Secondary | ICD-10-CM

## 2020-07-13 DIAGNOSIS — F17210 Nicotine dependence, cigarettes, uncomplicated: Secondary | ICD-10-CM | POA: Insufficient documentation

## 2020-07-13 DIAGNOSIS — Z8781 Personal history of (healed) traumatic fracture: Secondary | ICD-10-CM

## 2020-07-13 DIAGNOSIS — Z96653 Presence of artificial knee joint, bilateral: Secondary | ICD-10-CM | POA: Insufficient documentation

## 2020-07-13 DIAGNOSIS — Z96631 Presence of right artificial wrist joint: Secondary | ICD-10-CM | POA: Diagnosis not present

## 2020-07-13 LAB — URINALYSIS, ROUTINE W REFLEX MICROSCOPIC
Bilirubin Urine: NEGATIVE
Glucose, UA: NEGATIVE mg/dL
Hgb urine dipstick: NEGATIVE
Ketones, ur: 20 mg/dL — AB
Nitrite: NEGATIVE
Protein, ur: NEGATIVE mg/dL
Specific Gravity, Urine: 1.01 (ref 1.005–1.030)
pH: 7 (ref 5.0–8.0)

## 2020-07-13 MED ORDER — HYDROCODONE-ACETAMINOPHEN 5-325 MG PO TABS: 1.0000 | ORAL_TABLET | Freq: Four times a day (QID) | ORAL | 0 refills | Status: DC | PRN

## 2020-07-13 MED ORDER — FENTANYL CITRATE (PF) 100 MCG/2ML IJ SOLN
50.0000 ug | Freq: Once | INTRAMUSCULAR | Status: AC
  Administered 2020-07-13: 50 ug via INTRAMUSCULAR

## 2020-07-13 MED ORDER — HYDROCODONE-ACETAMINOPHEN 5-325 MG PO TABS
1.0000 | ORAL_TABLET | Freq: Once | ORAL | Status: AC
  Administered 2020-07-13: 1 via ORAL
  Filled 2020-07-13: qty 1

## 2020-07-13 MED ORDER — FENTANYL CITRATE (PF) 100 MCG/2ML IJ SOLN
50.0000 ug | Freq: Once | INTRAMUSCULAR | Status: DC
  Filled 2020-07-13: qty 2

## 2020-07-13 NOTE — ED Notes (Signed)
Pt in CT at this time.

## 2020-07-13 NOTE — ED Provider Notes (Signed)
[name] Provider Note   CSN: [id number] Arrival date & time: 07/13/20  1109     History Chief Complaint  Patient presents with  . Back Pain    Savannah Coleman is a 86 y.o. female w PMHx L1 and L4 compression fracture s/p L4 percutaneous balloon kyphoplasty on 05/23/2020, presenting to the emergency department with worsening low back pain that began on Sunday.  Patient's daughter is at bedside, reports that patient did a lot of walking on Sunday and Monday, more than her usual.  Her pain worsened after her activity on Sunday.  It is located to her low back bilaterally.  It is worse with particular movement.  She has no new weakness or numbness in her legs, no new bowel or bladder incontinence, no new fever or abdominal pain.  She does have increased urinary frequency, though no dysuria, fever, chills, nausea or vomiting.  She was initially prescribed Dilaudid for her pain postoperatively, however has been weaned down to tramadol.  She took a few doses of tramadol in the evening time prior to recurrence of pain.  She states the tramadol is doing a good job but treating her pain.  She is able to ambulate with her walker, however this worsens her pain.  Kyphoplasty was done by Dr. Owens Shark with [facility].  The history is provided by the patient, medical records and a relative.       Past Medical History:  Diagnosis Date  . Adenomatous colon polyp 2008  . Arthritis    knees  . Blood transfusion 03/2011  . Blood transfusion without reported diagnosis   . Closed fracture of left distal radius   . Closed fracture of right distal radius 04/17/2020  . Closed intertrochanteric fracture of hip, right, initial encounter ([place]) 04/17/2020  . Colon polyps   . Family history of anesthesia complication    " my son aspirated "  . GERD (gastroesophageal reflux disease)   . History of shingles   . Hyperlipidemia   . Hypertension   . 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  Marland Kitchen Rosacea     Patient Active Problem List   Diagnosis Date Noted  . Closed fracture of left distal radius   . Intertrochanteric fracture of right hip ([place]) 04/17/2020  . Closed fracture of right distal radius 04/17/2020  . Unspecified constipation 05/05/2014  . Postoperative anemia due to acute blood loss 05/02/2014  . Femur fracture, right ([place]) 04/30/2014  . Hypertension   . Hyperlipidemia   . Adenomatous colon polyp   . History of shingles   . Rosacea   . Positive PPD   . Blood transfusion 03/19/2011    Past Surgical History:  Procedure Laterality Date  . APPENDECTOMY  1958  . CHOLECYSTECTOMY  1958  . COLONOSCOPY    . FEMUR IM NAIL Right 05/01/2014   Procedure: INTRAMEDULLARY (IM) RETROGRADE FEMORAL NAILING;  Surgeon: Johnny Bridge, MD;  Location: [facility];  Service: Orthopedics;  Laterality: Right;  . FEMUR IM NAIL Right 04/19/2020   Procedure: REMOVAL OF BIOMET RETROGRADE NAIL;  Surgeon: Altamese Upper Mont[place], MD;  Location: [facility];  Service: Orthopedics;  Laterality: Right;  . FEMUR IM NAIL Right 04/19/2020   Procedure: INSERTION OF ANTEGRADE INTRAMEDULLARY (IM) NAIL FEMORAL;  Surgeon: Altamese Elgin, MD;  Location: [facility];  Service: Orthopedics;  Laterality: Right;  . FRACTURE SURGERY    .  JOINT REPLACEMENT    . ORIF WRIST FRACTURE Right 04/19/2020   Procedure: Open Reduction Internal Fixation (Orif) Wrist Fracture;  Surgeon: Altamese Gonvick, MD;  Location: [facility];  Service: Orthopedics;  Laterality: Right;  . POLYPECTOMY    . REPLACEMENT TOTAL KNEE BILATERAL  2011 & 2012  . TONSILLECTOMY       OB History   No obstetric history on file.     Family History  Problem Relation Age of Onset  . COPD Father   . Asthma Father   . Asthma Sister   . Colon cancer Neg Hx   . Stomach cancer Neg Hx     Social History   Tobacco Use  . Smoking status: Former Smoker    Types: Cigarettes  . Smokeless tobacco: Never  Used  . Tobacco comment: quit smoking in the early 80's  Substance Use Topics  . Alcohol use: No  . Drug use: No    Home Medications Prior to Admission medications   Medication Sig Start Date End Date Taking? Authorizing Provider  acetaminophen (TYLENOL) 500 MG tablet Take 1 tablet (500 mg total) by mouth every 12 (twelve) hours. 05/01/20   Alma Friendly, MD  amLODipine (NORVASC) 5 MG tablet Take 5 mg by mouth daily.     [provider]  ascorbic acid (VITAMIN C) 500 MG tablet Take 1 tablet (500 mg total) by mouth daily. 05/01/20   Alma Friendly, MD  BIOTIN PO Take 1 tablet by mouth daily with lunch.    [provider]  Calcium Carb-Cholecalciferol (CALCIUM+D3 PO) Take 1 tablet by mouth daily with lunch.    [provider]  Carboxymethylcellulose Sodium (THERATEARS OP) Place 1 drop into both eyes daily.    [provider]  diphenhydramine-acetaminophen (TYLENOL PM) 25-500 MG TABS tablet Take 1 tablet by mouth at bedtime as needed (sleep).    [provider]  docusate sodium (COLACE) 100 MG capsule Take 1 capsule (100 mg total) by mouth 2 (two) times daily. 05/01/20   Alma Friendly, MD  enoxaparin (LOVENOX) 40 MG/0.4ML injection Inject 0.4 mLs (40 mg total) into the skin daily for 28 days. 04/23/20 05/21/20  Ainsley Spinner, PA-C  ferrous sulfate 325 (65 FE) MG tablet Take 1 tablet (325 mg total) by mouth daily with breakfast. 05/01/20 06/30/20  Alma Friendly, MD  HYDROcodone-acetaminophen (NORCO/VICODIN) 5-325 MG tablet Take 1 tablet by mouth every 6 (six) hours as needed for severe pain. 07/13/20   Nathaneal Sommers, [place] N, PA-C  Multiple Vitamin (MULTIVITAMIN WITH MINERALS) TABS tablet Take 1 tablet by mouth daily with lunch.    [provider]  pantoprazole (PROTONIX) 40 MG tablet Take 40 mg by mouth daily. 03/18/20   [provider]  polyethylene glycol (MIRALAX / GLYCOLAX) 17 g packet Take 17 g by mouth daily. 05/02/20    Alma Friendly, MD  POTASSIUM PO Take 1 tablet by mouth daily with lunch.    [provider]  simvastatin (ZOCOR) 20 MG tablet Take 20 mg by mouth at bedtime. 04/17/20   [provider]  VITAMIN D PO Take 1 tablet by mouth daily with lunch.    [provider]    Allergies    Patient has no known allergies.  Review of Systems   Review of Systems  All other systems reviewed and are negative.   Physical Exam Updated Vital Signs BP (!) 149/91   Pulse 84   Temp 98.1 F (36.7 C) (Oral)  Resp 18   Ht 5\' 2"  (1.575 m)   Wt 54.4 kg   SpO2 99%   BMI 21.95 kg/m   Physical Exam Vitals and nursing note reviewed.  Constitutional:      General: She is not in acute distress.    Appearance: She is well-developed.  HENT:     Head: Normocephalic and atraumatic.  Eyes:     Conjunctiva/sclera: Conjunctivae normal.  Cardiovascular:     Rate and Rhythm: Normal rate and regular rhythm.  Pulmonary:     Effort: Pulmonary effort is normal.     Breath sounds: Normal breath sounds.  Abdominal:     General: Bowel sounds are normal.     Palpations: Abdomen is soft.     Tenderness: There is no abdominal tenderness.  Musculoskeletal:     Comments: No TTP to L-spine or paraspinal musculature. No bony stepoffs or gross deformities. No skin changes. Positive straight leg raise.  Skin:    General: Skin is warm.  Neurological:     Mental Status: She is alert.     Comments: Normal tone.  strength in BLE is strong and equal dorsiflexion/plantar flexion Sensory: Pinprick and light touch normal in BLE extremities.   Gait: deferred CV: distal pulses palpable throughout    Psychiatric:        Behavior: Behavior normal.     ED Results / Procedures / Treatments   Labs (all labs ordered are listed, but only abnormal results are displayed) Labs Reviewed  URINALYSIS, ROUTINE W REFLEX MICROSCOPIC - Abnormal; Notable for the following components:      Result Value    Color, Urine STRAW (*)    Ketones, ur 20 (*)    Leukocytes,Ua TRACE (*)    Bacteria, UA RARE (*)    All other components within normal limits  URINE CULTURE    EKG None  Radiology CT Lumbar Spine Wo Contrast  Result Date: 07/13/2020 CLINICAL DATA:  86 year old female with low back pain, recent kyphoplasty. EXAM: CT LUMBAR SPINE WITHOUT CONTRAST TECHNIQUE: Multidetector CT imaging of the lumbar spine was performed without intravenous contrast administration. Multiplanar CT image reconstructions were also generated. COMPARISON:  None. FINDINGS: Segmentation: Normal. Alignment: Mild straightening of lumbar lordosis and levoconvex lumbar scoliosis. No significant spondylolisthesis. Vertebrae: Osteopenia. Grossly intact visible lower thoracic levels. Chronic appearing L1 inferior endplate compression with moderate loss of vertebral body height and mild retropulsion (series 9, image 34). The L2 vertebra appears intact. Mild L3 superior endplate compression and comminution appears un healed (series 8, image 28) and could be acute to subacute. No significant retropulsion. The L3 pedicles and posterior elements appear intact. Augmented L4 compression fracture with a small volume of bone cement in the ventral epidural space (series 4, image 81). Mild retropulsion of the posterosuperior endplate. The L5 vertebra appears intact. Osteopenic visible sacrum and SI joints appear intact. Paraspinal and other soft tissues: Mild atelectasis and respiratory motion at the visible lung bases. Aortoiliac calcified atherosclerosis. Negative visible other abdominal viscera. But there is extensive diverticulosis in the distal large bowel in the pelvis which appears indistinct. Partially visible uterus and ovaries appear normal. Disc levels: Generally capacious lumbar spinal canal with mild for age degenerative changes superimposed on the multilevel compression fractures. IMPRESSION: 1. Osteopenia. Augmented L4 compression  fracture. Age indeterminate L3 superior endplate compression - suspected to be acute or subacute. Chronic appearing L1 compression fracture. Lumbar MRI without contrast or Nuclear Medicine Whole-body Bone Scan would best determine the acuity of L3.  2. Mild for age superimposed lumbar spine degeneration. 3. Aortic Atherosclerosis (ICD10-I70.0). Extensive diverticulosis of the distal colon. Electronically Signed   By: Genevie Ann M.D.   On: 07/13/2020 20:06    Procedures Procedures (including critical care time)  Medications Ordered in ED Medications  fentaNYL (SUBLIMAZE) injection 50 mcg (50 mcg Intramuscular Given 07/13/20 1652)    ED Course  I have reviewed the triage vital signs and the nursing notes.  Pertinent labs & imaging results that were available during my care of the patient were reviewed by me and considered in my medical decision making (see chart for details).  Clinical Course as of Jul 13 2029  Tue Jul 13, 2020  2020 Family reports her spine specialist was questioning the integrity of L3 as well during the kyphoplasty, this may not be new. Pt's symptoms managed well in the ED, she feels much better. Will d/c with pain medicine and instructions to follow closely with Dr. Owens Shark, her surgeon   [JR]    Clinical Course User Index [JR] Jesse Nosbisch, [place] N, PA-C   MDM Rules/Calculators/A&P                          Patient followed by Dr. Owens Shark with [facility], with history of L1 compression fracture, L4 compression fracture status post kyphoplasty on 05/23/2020.  She is presenting with worsening low back pain after increased exertion on Sunday and Monday.  No new injuries.  No new neuro deficits, normal neuro exam.  She is able to walk the states it is painful.    UA also checked as patient was stating she was urinating more frequently though without urgency or other urinary symptoms.  Urine specimen obtained, collected by myself and patient did not have clean-catch.  Not consistent with  UTI, will send for culture.    CT imaging with age-indeterminate L3 compression fracture.  Discussed this with patient and her family.  Her daughter reports her spine specialist with concern for problems at L3 as well during kyphoplasty.  Pain treated and managed well in the ED.  She will be instructed to follow closely with her neurosurgeon.  Will provide small amount of hydrocodone for pain relief as tramadol is not adequate at home.  Instructed patient not take these together.  Additional conservative measures discussed.  Return precautions.  Final Clinical Impression(s) / ED Diagnoses Final diagnoses:  History of compression fracture of spine  Bilateral low back pain without sciatica, unspecified chronicity    Rx / DC Orders ED Discharge Orders         Ordered    HYDROcodone-acetaminophen (NORCO/VICODIN) 5-325 MG tablet  Every 6 hours PRN     Discontinue  Reprint     07 /27/21 2025           Miller Ed[place]ton, [place] N, PA-C 07/13/20 2030    Fredia Sorrow, MD 07/16/20 [phone]

## 2020-07-13 NOTE — Discharge Instructions (Addendum)
Please call Dr. Owens Shark tomorrow morning for close follow-up on your visit today and for further evaluation of your L3 lumbar vertebra. Take the hydrocodone every 6 hours for severe pain.  Please take this in place of tramadol, or take tramadol alone.  Do not take these medications together. You can take low-dose Tylenol (325mg ) every 6 hours in addition to the hydrocodone. Apply ice to your back for 20 minutes at a time. Return to the emergency department for new numbness or weakness in extremities, new bowel or bladder incontinence, or new or concerning symptoms.

## 2020-07-13 NOTE — ED Triage Notes (Signed)
Pt arrives from [facility] via [facility] with c/o of recurrent low back pain, recent surgery on low back and had been feeling better until about 2-3 days ago. No new injury. Pt is still able to walk with walker no weakness or numbness

## 2020-07-13 NOTE — ED Provider Notes (Addendum)
Medical screening examination/treatment/procedure(s) were conducted as a shared visit with non-physician practitioner(s) and myself.  I personally evaluated the patient during the encounter.      Patient seen by me along with physician assistant. Patient status post kyphoplasty done at [facility] on June 6. For a new L4 compression fracture. Patient had significant improvement following that. Patient starting on Sunday with some lower back pain. No injury. No shooting pain to the legs. No numbness or weakness to the feet.  Based on the fact that there is new pain. A new compression fracture is a possibility particularly in the face of the kyphoplasty. Known to have significant osteoporosis.  Urinalysis without any acute findings of urinary tract infection. We will go ahead and do CT lumbar back to rule out any new compression fracture.  Depending on results patient will probably need to follow-up with her neurosurgeon that is in the [facility] system.   Fredia Sorrow, MD 07/13/20 1715   Patient based on our CT scan done here tonight which showed a new L3 compression fracture family was able to contact her neurosurgeon in the [place] area.  He called back and stated that he can do a kyphoplasty in the morning so he can admit her there tonight there cannot transport her there tonight.  Patient will be given a hydrocodone here to help with the pain during the drive over there.  No formal transfer required because patient was discharged from here and no transfer was arranged by [place].   Fredia Sorrow, MD 07/13/20 2102

## 2020-07-16 LAB — URINE CULTURE

## 2020-08-24 ENCOUNTER — Telehealth: Payer: Self-pay | Admitting: Internal Medicine

## 2020-08-24 NOTE — Telephone Encounter (Signed)
Scheduled [facility] Palliative visit for 09-01-20 at 12:30.

## 2020-09-01 ENCOUNTER — Other Ambulatory Visit: Payer: Self-pay

## 2020-09-01 ENCOUNTER — Other Ambulatory Visit: Payer: Medicare Other | Admitting: Internal Medicine

## 2021-02-07 ENCOUNTER — Encounter (HOSPITAL_COMMUNITY): Payer: Self-pay | Admitting: Emergency Medicine

## 2021-03-26 ENCOUNTER — Inpatient Hospital Stay (HOSPITAL_COMMUNITY)
Admission: EM | Admit: 2021-03-26 | Discharge: 2021-04-02 | DRG: 481 | Disposition: A | Discharge status: Skilled Nursing Facility | Payer: Medicare Other | Source: Skilled Nursing Facility | Attending: Family Medicine | Admitting: Family Medicine

## 2021-03-26 ENCOUNTER — Emergency Department (HOSPITAL_COMMUNITY): Payer: Medicare Other

## 2021-03-26 DIAGNOSIS — Z791 Long term (current) use of non-steroidal anti-inflammatories (NSAID): Secondary | ICD-10-CM

## 2021-03-26 DIAGNOSIS — E876 Hypokalemia: Secondary | ICD-10-CM | POA: Diagnosis present

## 2021-03-26 DIAGNOSIS — W1830XA Fall on same level, unspecified, initial encounter: Secondary | ICD-10-CM | POA: Diagnosis present

## 2021-03-26 DIAGNOSIS — T148XXA Other injury of unspecified body region, initial encounter: Secondary | ICD-10-CM

## 2021-03-26 DIAGNOSIS — D72829 Elevated white blood cell count, unspecified: Secondary | ICD-10-CM | POA: Diagnosis present

## 2021-03-26 DIAGNOSIS — Z96641 Presence of right artificial hip joint: Secondary | ICD-10-CM | POA: Diagnosis present

## 2021-03-26 DIAGNOSIS — S42232A 3-part fracture of surgical neck of left humerus, initial encounter for closed fracture: Secondary | ICD-10-CM | POA: Diagnosis present

## 2021-03-26 DIAGNOSIS — D62 Acute posthemorrhagic anemia: Secondary | ICD-10-CM | POA: Diagnosis not present

## 2021-03-26 DIAGNOSIS — Z789 Other specified health status: Secondary | ICD-10-CM | POA: Diagnosis present

## 2021-03-26 DIAGNOSIS — E785 Hyperlipidemia, unspecified: Secondary | ICD-10-CM | POA: Diagnosis present

## 2021-03-26 DIAGNOSIS — K219 Gastro-esophageal reflux disease without esophagitis: Secondary | ICD-10-CM | POA: Diagnosis present

## 2021-03-26 DIAGNOSIS — J449 Chronic obstructive pulmonary disease, unspecified: Secondary | ICD-10-CM | POA: Diagnosis present

## 2021-03-26 DIAGNOSIS — Y92129 Unspecified place in nursing home as the place of occurrence of the external cause: Secondary | ICD-10-CM

## 2021-03-26 DIAGNOSIS — Z981 Arthrodesis status: Secondary | ICD-10-CM

## 2021-03-26 DIAGNOSIS — Z79899 Other long term (current) drug therapy: Secondary | ICD-10-CM

## 2021-03-26 DIAGNOSIS — S72002A Fracture of unspecified part of neck of left femur, initial encounter for closed fracture: Secondary | ICD-10-CM

## 2021-03-26 DIAGNOSIS — S52502A Unspecified fracture of the lower end of left radius, initial encounter for closed fracture: Secondary | ICD-10-CM | POA: Diagnosis present

## 2021-03-26 DIAGNOSIS — S72142A Displaced intertrochanteric fracture of left femur, initial encounter for closed fracture: Secondary | ICD-10-CM | POA: Diagnosis not present

## 2021-03-26 DIAGNOSIS — Y92121 Bathroom in nursing home as the place of occurrence of the external cause: Secondary | ICD-10-CM

## 2021-03-26 DIAGNOSIS — S42292A Other displaced fracture of upper end of left humerus, initial encounter for closed fracture: Secondary | ICD-10-CM

## 2021-03-26 DIAGNOSIS — Z96612 Presence of left artificial shoulder joint: Secondary | ICD-10-CM

## 2021-03-26 DIAGNOSIS — E7800 Pure hypercholesterolemia, unspecified: Secondary | ICD-10-CM | POA: Diagnosis present

## 2021-03-26 DIAGNOSIS — I10 Essential (primary) hypertension: Secondary | ICD-10-CM | POA: Diagnosis present

## 2021-03-26 DIAGNOSIS — Z20822 Contact with and (suspected) exposure to covid-19: Secondary | ICD-10-CM | POA: Diagnosis present

## 2021-03-26 DIAGNOSIS — Z885 Allergy status to narcotic agent status: Secondary | ICD-10-CM

## 2021-03-26 DIAGNOSIS — S42292D Other displaced fracture of upper end of left humerus, subsequent encounter for fracture with routine healing: Secondary | ICD-10-CM

## 2021-03-26 DIAGNOSIS — R296 Repeated falls: Secondary | ICD-10-CM

## 2021-03-26 DIAGNOSIS — T07XXXA Unspecified multiple injuries, initial encounter: Secondary | ICD-10-CM | POA: Diagnosis present

## 2021-03-26 DIAGNOSIS — S42213A Unspecified displaced fracture of surgical neck of unspecified humerus, initial encounter for closed fracture: Secondary | ICD-10-CM | POA: Diagnosis present

## 2021-03-26 DIAGNOSIS — M810 Age-related osteoporosis without current pathological fracture: Secondary | ICD-10-CM | POA: Diagnosis present

## 2021-03-26 DIAGNOSIS — S52602A Unspecified fracture of lower end of left ulna, initial encounter for closed fracture: Secondary | ICD-10-CM | POA: Diagnosis present

## 2021-03-26 DIAGNOSIS — W19XXXA Unspecified fall, initial encounter: Secondary | ICD-10-CM

## 2021-03-26 HISTORY — DX: Anxiety disorder, unspecified: F41.9

## 2021-03-26 HISTORY — DX: Anemia, unspecified: D64.9

## 2021-03-26 HISTORY — DX: Insomnia, unspecified: G47.00

## 2021-03-26 HISTORY — DX: Chronic obstructive pulmonary disease, unspecified: J44.9

## 2021-03-26 HISTORY — DX: Heartburn: R12

## 2021-03-26 HISTORY — DX: Pure hypercholesterolemia, unspecified: E78.00

## 2021-03-26 HISTORY — DX: Personal history of (healed) traumatic fracture: Z87.81

## 2021-03-26 HISTORY — DX: Constipation, unspecified: K59.00

## 2021-03-26 HISTORY — DX: Essential (primary) hypertension: I10

## 2021-03-26 NOTE — ED Provider Notes (Signed)
[facility] EMERGENCY DEPARTMENT Provider Note   CSN: [id number] Arrival date & time: 03/26/21  2348   History Chief complaint: Savannah Coleman is a 87 y.o. female.  The history is provided by the patient and the EMS personnel.  She came in by ambulance as a level 2 trauma following a fall at a nursing home.  She was undergoing rehab following surgical repair of right hip fracture.  She apparently fell trying to pick up a spider.  She injured her left wrist, left shoulder, left hip.  EMS has given fentanyl 200 mg total, splinted the left wrist.  She she denies head or neck injury.  She is not on any systemic anticoagulants but is on low-dose aspirin.  No past medical history on file.  There are no problems to display for this patient.   ** The histories are not reviewed yet. Please review them in the "History" navigator section and refresh this [place].   OB History   No obstetric history on file.     No family history on file.     Home Medications Prior to Admission medications   Not on File    Allergies    Patient has no allergy information on record.  Review of Systems   Review of Systems  All other systems reviewed and are negative.   Physical Exam Updated Vital Signs BP (!) 189/110   Pulse 97   Resp 13   SpO2 99%   Physical Exam Vitals and nursing note reviewed.   87 year old female, resting comfortably and in no acute distress. Vital signs are significant for elevated blood pressure. Oxygen saturation is 99%, which is normal. Head is normocephalic and atraumatic. PERRLA, EOMI. Oropharynx is clear. Neck is immobilized in a stiff cervical collar and is nontender without adenopathy or JVD. Back is nontender and there is no CVA tenderness. Lungs are clear without rales, wheezes, or rhonchi. Chest is nontender. Heart has regular rate and rhythm without murmur. Abdomen is soft, flat, nontender without masses or hepatosplenomegaly  and peristalsis is normoactive. Pelvis is stable and nontender. Extremities: There is swelling and tenderness over the left shoulder.  Deformity is present in the left wrist consistent with Colles' fracture.  Distal neurovascular exam is intact with normal sensation and prompt capillary refill.  Left leg is shortened and externally rotated with tenderness at the left hip and pain on passive range of motion of the left hip.  Dorsalis pedis pulses strong and there is normal sensation and prompt capillary refill.  Full range of motion present right arm and right leg without pain. Skin is warm and dry without rash. Neurologic: Mental status is normal, cranial nerves are intact, there are no gross motor or sensory deficits.  ED Results / Procedures / Treatments   Labs (all labs ordered are listed, but only abnormal results are displayed) Labs Reviewed  COMPREHENSIVE METABOLIC PANEL - Abnormal; Notable for the following components:      Result Value   Potassium 2.8 (*)    Glucose, Bld 150 (*)    All other components within normal limits  CBC WITH DIFFERENTIAL/PLATELET - Abnormal; Notable for the following components:   MCV 100.5 (*)    Abs Immature Granulocytes 0.09 (*)    All other components within normal limits  SARS CORONAVIRUS 2 (TAT 6-24 HRS)  ETHANOL  PROTIME-INR  MAGNESIUM  TYPE AND SCREEN  ABO/RH    EKG None  Radiology DG Wrist 2 Views Left  Result Date: 03/27/2021 CLINICAL DATA:  Fall with wrist deformity EXAM: LEFT WRIST - 2 VIEW COMPARISON:  04/30/2020 FINDINGS: Acute comminuted fracture involving the distal ulna with 1 shaft diameter dorsal displacement and moderate dorsal and radial angulation of distal fracture fragment. Fracture lucency also involves the ulnar styloid. Acute comminuted distal radius fracture with 1/2 shaft diameter radial and 1 shaft diameter dorsal displacement. There is moderate radial and dorsal angulation of distal fracture fragments. IMPRESSION: Acute  comminuted displaced and angulated distal radius and ulna fractures. Electronically Signed   By: Donavan Foil M.D.   On: 03/27/2021 00:25   CT Head Wo Contrast  Result Date: 03/27/2021 CLINICAL DATA:  Fall EXAM: CT HEAD WITHOUT CONTRAST CT CERVICAL SPINE WITHOUT CONTRAST TECHNIQUE: Multidetector CT imaging of the head and cervical spine was performed following the standard protocol without intravenous contrast. Multiplanar CT image reconstructions of the cervical spine were also generated. COMPARISON:  None. FINDINGS: CT HEAD FINDINGS Brain: There is no mass, hemorrhage or extra-axial collection. There is generalized atrophy without lobar predilection. There is hypoattenuation of the periventricular white matter, most commonly indicating chronic ischemic microangiopathy. Vascular: No abnormal hyperdensity of the major intracranial arteries or dural venous sinuses. No intracranial atherosclerosis. Skull: The visualized skull base, calvarium and extracranial soft tissues are normal. Sinuses/Orbits: No fluid levels or advanced mucosal thickening of the visualized paranasal sinuses. No mastoid or middle ear effusion. The orbits are normal. CT CERVICAL SPINE FINDINGS Alignment: No static subluxation. Facets are aligned. Occipital condyles are normally positioned. Skull base and vertebrae: No acute fracture. Soft tissues and spinal canal: No prevertebral fluid or swelling. No visible canal hematoma. Disc levels: No advanced spinal canal or neural foraminal stenosis. Upper chest: No pneumothorax, pulmonary nodule or pleural effusion. Other: Normal visualized paraspinal cervical soft tissues. IMPRESSION: 1. Chronic ischemic microangiopathy and generalized atrophy without acute intracranial abnormality. 2. No acute fracture or static subluxation of the cervical spine. Electronically Signed   By: Ulyses Jarred M.D.   On: 03/27/2021 00:53   CT Cervical Spine Wo Contrast  Result Date: 03/27/2021 CLINICAL DATA:  Fall  EXAM: CT HEAD WITHOUT CONTRAST CT CERVICAL SPINE WITHOUT CONTRAST TECHNIQUE: Multidetector CT imaging of the head and cervical spine was performed following the standard protocol without intravenous contrast. Multiplanar CT image reconstructions of the cervical spine were also generated. COMPARISON:  None. FINDINGS: CT HEAD FINDINGS Brain: There is no mass, hemorrhage or extra-axial collection. There is generalized atrophy without lobar predilection. There is hypoattenuation of the periventricular white matter, most commonly indicating chronic ischemic microangiopathy. Vascular: No abnormal hyperdensity of the major intracranial arteries or dural venous sinuses. No intracranial atherosclerosis. Skull: The visualized skull base, calvarium and extracranial soft tissues are normal. Sinuses/Orbits: No fluid levels or advanced mucosal thickening of the visualized paranasal sinuses. No mastoid or middle ear effusion. The orbits are normal. CT CERVICAL SPINE FINDINGS Alignment: No static subluxation. Facets are aligned. Occipital condyles are normally positioned. Skull base and vertebrae: No acute fracture. Soft tissues and spinal canal: No prevertebral fluid or swelling. No visible canal hematoma. Disc levels: No advanced spinal canal or neural foraminal stenosis. Upper chest: No pneumothorax, pulmonary nodule or pleural effusion. Other: Normal visualized paraspinal cervical soft tissues. IMPRESSION: 1. Chronic ischemic microangiopathy and generalized atrophy without acute intracranial abnormality. 2. No acute fracture or static subluxation of the cervical spine. Electronically Signed   By: Ulyses Jarred M.D.   On: 03/27/2021 00:53   DG Pelvis Portable  Result Date: 03/27/2021 CLINICAL DATA:  Fall EXAM: PORTABLE PELVIS 1-2 VIEWS COMPARISON:  04/17/2020 FINDINGS: Partially visualized intramedullary rod in the right femur. Treated compression deformities of the lumbar spine. SI joints are non widened. The pubic  symphysis and rami appear intact. Acute impacted appearing basicervical fracture on the left. IMPRESSION: Acute impacted appearing left basicervical femoral fracture. Electronically Signed   By: Donavan Foil M.D.   On: 03/27/2021 00:23   DG Chest Portable 1 View  Result Date: 03/27/2021 CLINICAL DATA:  Level 2 trauma, fall EXAM: PORTABLE CHEST 1 VIEW COMPARISON:  04/17/2020 12/12/2015 FINDINGS: Linear scarring or atelectasis at the left base. Mild chronic bronchitic changes. No acute airspace disease. Stable cardiac size. Probable skin fold artifact at left CP angle. Acute displaced proximal left humerus fracture. IMPRESSION: 1. Linear scarring or atelectasis at the left base. 2. Acute displaced proximal left humerus fracture. Electronically Signed   By: Donavan Foil M.D.   On: 03/27/2021 00:19   DG Shoulder Left Port  Result Date: 03/27/2021 CLINICAL DATA:  Fall EXAM: LEFT SHOULDER COMPARISON:  None. FINDINGS: AC joint is intact. Left lung apex is clear. Acute mildly comminuted fracture involving the left humeral neck with moderate posterior angulation of distal fracture fragment and close to 1 shaft diameter anterior displacement of distal fracture fragment. The humeral head appears to project over glenoid IMPRESSION: Acute mildly comminuted, displaced and angulated left humeral neck fracture. Electronically Signed   By: Donavan Foil M.D.   On: 03/27/2021 00:20    Procedures Procedures  CRITICAL CARE Performed by: Delora Fuel Total critical care time: 50 minutes Critical care time was exclusive of separately billable procedures and treating other patients. Critical care was necessary to treat or prevent imminent or life-threatening deterioration. Critical care was time spent personally by me on the following activities: development of treatment plan with patient and/or surrogate as well as nursing, discussions with consultants, evaluation of patient's response to treatment, examination of  patient, obtaining history from patient or surrogate, ordering and performing treatments and interventions, ordering and review of laboratory studies, ordering and review of radiographic studies, pulse oximetry and re-evaluation of patient's condition.  Medications Ordered in ED Medications  potassium chloride SA (KLOR-CON) CR tablet 40 mEq (has no administration in time range)  potassium chloride 10 mEq in 100 mL IVPB (has no administration in time range)    ED Course  I have reviewed the triage vital signs and the nursing notes.  Pertinent labs & imaging results that were available during my care of the patient were reviewed by me and considered in my medical decision making (see chart for details).  MDM Rules/Calculators/A&P Fall with injury to left shoulder, left wrist, left hip.  X-rays been ordered.  Because mechanism of injury, CT of head and cervical spine have also been ordered.  X-rays confirmed intertrochanteric hip fracture, proximal left humerus fracture, fracture of the distal left radius.  CT scan showed no acute injury.  Labs are significant for slightly microcytic RBC indices, hypokalemia.  She is given oral and intravenous potassium.  Case is discussed with Dr. Stann Mainland, on-call for orthopedics who states he will schedule her for surgical repair of her hip fracture.  Case is discussed with Dr. Lenon Curt, on-call for hand surgery, who will arrange for surgical management of her wrist fracture.  Case is discussed with Dr. Chauncey Reading of family medicine service, who agrees to admit the patient.  ECG obtained for preop evaluation left anterior fascicular block but no acute ST or T changes.  Final Clinical  Impression(s) / ED Diagnoses Final diagnoses:  Fall at nursing home, initial encounter  Closed fracture of neck of left femur, initial encounter ([place])  Closed fracture of left distal radius and ulna, initial encounter  Closed fracture of anatomical neck of left humerus, initial encounter   Hypokalemia    Rx / DC Orders ED Discharge Orders    None       Delora Fuel, MD 60/47/99 [phone]

## 2021-03-27 ENCOUNTER — Inpatient Hospital Stay (HOSPITAL_COMMUNITY): Payer: Medicare Other

## 2021-03-27 ENCOUNTER — Inpatient Hospital Stay (HOSPITAL_COMMUNITY): Payer: Medicare Other | Admitting: Anesthesiology

## 2021-03-27 ENCOUNTER — Encounter (HOSPITAL_COMMUNITY)
Admission: EM | Disposition: A | Discharge status: Skilled Nursing Facility | Payer: Self-pay | Source: Skilled Nursing Facility | Attending: Family Medicine

## 2021-03-27 ENCOUNTER — Other Ambulatory Visit: Payer: Self-pay

## 2021-03-27 ENCOUNTER — Encounter (HOSPITAL_COMMUNITY): Payer: Self-pay

## 2021-03-27 ENCOUNTER — Inpatient Hospital Stay: Admit: 2021-03-27 | Payer: Medicare Other | Admitting: Student

## 2021-03-27 ENCOUNTER — Emergency Department (HOSPITAL_COMMUNITY): Payer: Medicare Other

## 2021-03-27 DIAGNOSIS — Y92121 Bathroom in nursing home as the place of occurrence of the external cause: Secondary | ICD-10-CM | POA: Diagnosis not present

## 2021-03-27 DIAGNOSIS — S72002A Fracture of unspecified part of neck of left femur, initial encounter for closed fracture: Secondary | ICD-10-CM | POA: Diagnosis not present

## 2021-03-27 DIAGNOSIS — K219 Gastro-esophageal reflux disease without esophagitis: Secondary | ICD-10-CM | POA: Diagnosis not present

## 2021-03-27 DIAGNOSIS — W1830XA Fall on same level, unspecified, initial encounter: Secondary | ICD-10-CM | POA: Diagnosis present

## 2021-03-27 DIAGNOSIS — E785 Hyperlipidemia, unspecified: Secondary | ICD-10-CM | POA: Diagnosis present

## 2021-03-27 DIAGNOSIS — W19XXXA Unspecified fall, initial encounter: Secondary | ICD-10-CM

## 2021-03-27 DIAGNOSIS — M810 Age-related osteoporosis without current pathological fracture: Secondary | ICD-10-CM | POA: Diagnosis present

## 2021-03-27 DIAGNOSIS — S52502A Unspecified fracture of the lower end of left radius, initial encounter for closed fracture: Secondary | ICD-10-CM | POA: Diagnosis not present

## 2021-03-27 DIAGNOSIS — I10 Essential (primary) hypertension: Secondary | ICD-10-CM

## 2021-03-27 DIAGNOSIS — E876 Hypokalemia: Secondary | ICD-10-CM | POA: Diagnosis not present

## 2021-03-27 DIAGNOSIS — W19XXXD Unspecified fall, subsequent encounter: Secondary | ICD-10-CM | POA: Diagnosis not present

## 2021-03-27 DIAGNOSIS — Y92129 Unspecified place in nursing home as the place of occurrence of the external cause: Secondary | ICD-10-CM

## 2021-03-27 DIAGNOSIS — S72002D Fracture of unspecified part of neck of left femur, subsequent encounter for closed fracture with routine healing: Secondary | ICD-10-CM | POA: Diagnosis not present

## 2021-03-27 DIAGNOSIS — S42232A 3-part fracture of surgical neck of left humerus, initial encounter for closed fracture: Secondary | ICD-10-CM | POA: Diagnosis not present

## 2021-03-27 DIAGNOSIS — S72142A Displaced intertrochanteric fracture of left femur, initial encounter for closed fracture: Secondary | ICD-10-CM | POA: Diagnosis not present

## 2021-03-27 DIAGNOSIS — Z789 Other specified health status: Secondary | ICD-10-CM

## 2021-03-27 DIAGNOSIS — R296 Repeated falls: Secondary | ICD-10-CM

## 2021-03-27 DIAGNOSIS — Z885 Allergy status to narcotic agent status: Secondary | ICD-10-CM | POA: Diagnosis not present

## 2021-03-27 DIAGNOSIS — Z791 Long term (current) use of non-steroidal anti-inflammatories (NSAID): Secondary | ICD-10-CM | POA: Diagnosis not present

## 2021-03-27 DIAGNOSIS — S52602A Unspecified fracture of lower end of left ulna, initial encounter for closed fracture: Secondary | ICD-10-CM | POA: Diagnosis not present

## 2021-03-27 DIAGNOSIS — T07XXXA Unspecified multiple injuries, initial encounter: Secondary | ICD-10-CM

## 2021-03-27 DIAGNOSIS — S42292A Other displaced fracture of upper end of left humerus, initial encounter for closed fracture: Secondary | ICD-10-CM

## 2021-03-27 DIAGNOSIS — S42213A Unspecified displaced fracture of surgical neck of unspecified humerus, initial encounter for closed fracture: Secondary | ICD-10-CM | POA: Diagnosis present

## 2021-03-27 DIAGNOSIS — Z20822 Contact with and (suspected) exposure to covid-19: Secondary | ICD-10-CM | POA: Diagnosis not present

## 2021-03-27 DIAGNOSIS — S42292D Other displaced fracture of upper end of left humerus, subsequent encounter for fracture with routine healing: Secondary | ICD-10-CM | POA: Diagnosis not present

## 2021-03-27 DIAGNOSIS — E7800 Pure hypercholesterolemia, unspecified: Secondary | ICD-10-CM | POA: Diagnosis present

## 2021-03-27 DIAGNOSIS — D62 Acute posthemorrhagic anemia: Secondary | ICD-10-CM | POA: Diagnosis not present

## 2021-03-27 DIAGNOSIS — Z981 Arthrodesis status: Secondary | ICD-10-CM | POA: Diagnosis not present

## 2021-03-27 DIAGNOSIS — J449 Chronic obstructive pulmonary disease, unspecified: Secondary | ICD-10-CM | POA: Diagnosis not present

## 2021-03-27 DIAGNOSIS — D72829 Elevated white blood cell count, unspecified: Secondary | ICD-10-CM | POA: Diagnosis not present

## 2021-03-27 DIAGNOSIS — Z79899 Other long term (current) drug therapy: Secondary | ICD-10-CM | POA: Diagnosis not present

## 2021-03-27 DIAGNOSIS — Z96641 Presence of right artificial hip joint: Secondary | ICD-10-CM | POA: Diagnosis not present

## 2021-03-27 HISTORY — PX: ORIF WRIST FRACTURE: SHX2133

## 2021-03-27 HISTORY — PX: INTRAMEDULLARY (IM) NAIL INTERTROCHANTERIC: SHX5875

## 2021-03-27 HISTORY — DX: Repeated falls: R29.6

## 2021-03-27 HISTORY — DX: Other specified health status: Z78.9

## 2021-03-27 LAB — CBC WITH DIFFERENTIAL/PLATELET
Abs Immature Granulocytes: 0.09 10*3/uL — ABNORMAL HIGH (ref 0.00–0.07)
Basophils Absolute: 0 10*3/uL (ref 0.0–0.1)
Basophils Relative: 0 %
Eosinophils Absolute: 0.2 10*3/uL (ref 0.0–0.5)
Eosinophils Relative: 2 %
HCT: 41.2 % (ref 36.0–46.0)
Hemoglobin: 13.6 g/dL (ref 12.0–15.0)
Immature Granulocytes: 1 %
Lymphocytes Relative: 24 %
Lymphs Abs: 2.2 10*3/uL (ref 0.7–4.0)
MCH: 33.2 pg (ref 26.0–34.0)
MCHC: 33 g/dL (ref 30.0–36.0)
MCV: 100.5 fL — ABNORMAL HIGH (ref 80.0–100.0)
Monocytes Absolute: 0.7 10*3/uL (ref 0.1–1.0)
Monocytes Relative: 7 %
Neutro Abs: 6.1 10*3/uL (ref 1.7–7.7)
Neutrophils Relative %: 66 %
Platelets: 241 10*3/uL (ref 150–400)
RBC: 4.1 MIL/uL (ref 3.87–5.11)
RDW: 12.5 % (ref 11.5–15.5)
WBC: 9.3 10*3/uL (ref 4.0–10.5)
nRBC: 0 % (ref 0.0–0.2)

## 2021-03-27 LAB — COMPREHENSIVE METABOLIC PANEL
ALT: 22 U/L (ref 0–44)
AST: 28 U/L (ref 15–41)
Albumin: 3.9 g/dL (ref 3.5–5.0)
Alkaline Phosphatase: 66 U/L (ref 38–126)
Anion gap: 7 (ref 5–15)
BUN: 9 mg/dL (ref 8–23)
CO2: 28 mmol/L (ref 22–32)
Calcium: 9.4 mg/dL (ref 8.9–10.3)
Chloride: 102 mmol/L (ref 98–111)
Creatinine, Ser: 0.7 mg/dL (ref 0.44–1.00)
GFR, Estimated: >60 mL/min (ref >60)
Glucose, Bld: 150 mg/dL — ABNORMAL HIGH (ref 70–99)
Potassium: 2.8 mmol/L — ABNORMAL LOW (ref 3.5–5.1)
Sodium: 137 mmol/L (ref 135–145)
Total Bilirubin: 0.6 mg/dL (ref 0.3–1.2)
Total Protein: 6.5 g/dL (ref 6.5–8.1)

## 2021-03-27 LAB — TYPE AND SCREEN
ABO/RH(D): O POS
Antibody Screen: NEGATIVE

## 2021-03-27 LAB — CBC
HCT: 38.2 % (ref 36.0–46.0)
Hemoglobin: 12.7 g/dL (ref 12.0–15.0)
MCH: 33.5 pg (ref 26.0–34.0)
MCHC: 33.2 g/dL (ref 30.0–36.0)
MCV: 100.8 fL — ABNORMAL HIGH (ref 80.0–100.0)
Platelets: 248 10*3/uL (ref 150–400)
RBC: 3.79 MIL/uL — ABNORMAL LOW (ref 3.87–5.11)
RDW: 12.7 % (ref 11.5–15.5)
WBC: 19.6 10*3/uL — ABNORMAL HIGH (ref 4.0–10.5)
nRBC: 0 % (ref 0.0–0.2)

## 2021-03-27 LAB — VITAMIN B12: Vitamin B-12: 421 pg/mL (ref 180–914)

## 2021-03-27 LAB — FOLATE: Folate: 19.8 ng/mL (ref >5.9)

## 2021-03-27 LAB — ABO/RH: ABO/RH(D): O POS

## 2021-03-27 LAB — PROTIME-INR
INR: 1.1 (ref 0.8–1.2)
Prothrombin Time: 13.3 seconds (ref 11.4–15.2)

## 2021-03-27 LAB — CREATININE, SERUM
Creatinine, Ser: 0.66 mg/dL (ref 0.44–1.00)
GFR, Estimated: >60 mL/min (ref >60)

## 2021-03-27 LAB — MAGNESIUM: Magnesium: 1.8 mg/dL (ref 1.7–2.4)

## 2021-03-27 LAB — SARS CORONAVIRUS 2 (TAT 6-24 HRS): SARS Coronavirus 2: NEGATIVE

## 2021-03-27 LAB — ETHANOL: Alcohol, Ethyl (B): <10 mg/dL (ref <10)

## 2021-03-27 LAB — MRSA PCR SCREENING: MRSA by PCR: NEGATIVE

## 2021-03-27 SURGERY — FIXATION, FRACTURE, INTERTROCHANTERIC, WITH INTRAMEDULLARY ROD
Anesthesia: General | Laterality: Left

## 2021-03-27 SURGERY — FIXATION, FRACTURE, INTERTROCHANTERIC, WITH INTRAMEDULLARY ROD
Anesthesia: Monitor Anesthesia Care | Site: Wrist | Laterality: Left

## 2021-03-27 MED ORDER — DEXAMETHASONE SODIUM PHOSPHATE 10 MG/ML IJ SOLN
INTRAMUSCULAR | Status: DC | PRN
  Administered 2021-03-27: 10 mg via INTRAVENOUS

## 2021-03-27 MED ORDER — ACETAMINOPHEN 325 MG PO TABS
650.0000 mg | ORAL_TABLET | Freq: Four times a day (QID) | ORAL | Status: DC
  Administered 2021-03-27 – 2021-04-02 (×21): 650 mg via ORAL
  Filled 2021-03-27 (×21): qty 2

## 2021-03-27 MED ORDER — FENTANYL CITRATE (PF) 100 MCG/2ML IJ SOLN
INTRAMUSCULAR | Status: AC
  Administered 2021-03-27: 100 ug via INTRAVENOUS
  Filled 2021-03-27: qty 2

## 2021-03-27 MED ORDER — CHLORHEXIDINE GLUCONATE 0.12 % MT SOLN
OROMUCOSAL | Status: AC
  Filled 2021-03-27: qty 15

## 2021-03-27 MED ORDER — POTASSIUM CHLORIDE 10 MEQ/100ML IV SOLN: 10.0000 meq | INTRAVENOUS | Status: DC

## 2021-03-27 MED ORDER — LACTATED RINGERS IV SOLN: INTRAVENOUS | Status: DC

## 2021-03-27 MED ORDER — POLYETHYLENE GLYCOL 3350 17 G PO PACK
17.0000 g | PACK | Freq: Two times a day (BID) | ORAL | Status: DC
  Administered 2021-03-27 – 2021-04-02 (×8): 17 g via ORAL
  Filled 2021-03-27 (×10): qty 1

## 2021-03-27 MED ORDER — TIZANIDINE HCL 4 MG PO TABS
2.0000 mg | ORAL_TABLET | Freq: Three times a day (TID) | ORAL | Status: DC | PRN
  Administered 2021-03-28 – 2021-04-01 (×3): 2 mg via ORAL
  Filled 2021-03-27 (×3): qty 1

## 2021-03-27 MED ORDER — PROPOFOL 10 MG/ML IV BOLUS
INTRAVENOUS | Status: AC
  Filled 2021-03-27: qty 20

## 2021-03-27 MED ORDER — SENNA 8.6 MG PO TABS
1.0000 | ORAL_TABLET | Freq: Every day | ORAL | Status: DC
  Administered 2021-03-28 – 2021-04-02 (×6): 8.6 mg via ORAL
  Filled 2021-03-27 (×6): qty 1

## 2021-03-27 MED ORDER — OXYCODONE HCL 5 MG PO TABS: 2.5000 mg | ORAL_TABLET | Freq: Four times a day (QID) | ORAL | Status: DC

## 2021-03-27 MED ORDER — ONDANSETRON HCL 4 MG/2ML IJ SOLN
INTRAMUSCULAR | Status: DC | PRN
  Administered 2021-03-27: 4 mg via INTRAVENOUS

## 2021-03-27 MED ORDER — HYDROCODONE-ACETAMINOPHEN 5-325 MG PO TABS
1.0000 | ORAL_TABLET | Freq: Every morning | ORAL | Status: DC
  Administered 2021-03-28: 1 via ORAL
  Filled 2021-03-27 (×2): qty 1

## 2021-03-27 MED ORDER — CEFAZOLIN SODIUM-DEXTROSE 2-3 GM-%(50ML) IV SOLR
INTRAVENOUS | Status: DC | PRN
  Administered 2021-03-27: 2 g via INTRAVENOUS

## 2021-03-27 MED ORDER — PROCHLORPERAZINE EDISYLATE 10 MG/2ML IJ SOLN
5.0000 mg | Freq: Four times a day (QID) | INTRAMUSCULAR | Status: DC | PRN
  Administered 2021-03-27: 5 mg via INTRAVENOUS
  Filled 2021-03-27: qty 2

## 2021-03-27 MED ORDER — TRANEXAMIC ACID 1000 MG/10ML IV SOLN
INTRAVENOUS | Status: DC | PRN
  Administered 2021-03-27: 1000 mg via INTRAVENOUS

## 2021-03-27 MED ORDER — ONDANSETRON HCL 4 MG PO TABS: 4.0000 mg | ORAL_TABLET | Freq: Four times a day (QID) | ORAL | Status: DC | PRN

## 2021-03-27 MED ORDER — PHENYLEPHRINE HCL-NACL 10-0.9 MG/250ML-% IV SOLN
INTRAVENOUS | Status: DC | PRN
  Administered 2021-03-27: 50 ug/min via INTRAVENOUS

## 2021-03-27 MED ORDER — CEFAZOLIN SODIUM-DEXTROSE 2-4 GM/100ML-% IV SOLN
2.0000 g | Freq: Three times a day (TID) | INTRAVENOUS | Status: AC
  Administered 2021-03-27 – 2021-03-28 (×3): 2 g via INTRAVENOUS
  Filled 2021-03-27 (×3): qty 100

## 2021-03-27 MED ORDER — 0.9 % SODIUM CHLORIDE (POUR BTL) OPTIME
TOPICAL | Status: DC | PRN
  Administered 2021-03-27: 1000 mL

## 2021-03-27 MED ORDER — TRANEXAMIC ACID-NACL 1000-0.7 MG/100ML-% IV SOLN
1000.0000 mg | Freq: Once | INTRAVENOUS | Status: AC
  Administered 2021-03-27: 1000 mg via INTRAVENOUS
  Filled 2021-03-27: qty 100

## 2021-03-27 MED ORDER — POLYETHYLENE GLYCOL 3350 17 G PO PACK: 17.0000 g | PACK | Freq: Every day | ORAL | Status: DC | PRN

## 2021-03-27 MED ORDER — CEFAZOLIN SODIUM-DEXTROSE 2-4 GM/100ML-% IV SOLN
INTRAVENOUS | Status: AC
  Filled 2021-03-27: qty 100

## 2021-03-27 MED ORDER — ONDANSETRON HCL 4 MG/2ML IJ SOLN: 4.0000 mg | Freq: Four times a day (QID) | INTRAMUSCULAR | Status: DC | PRN

## 2021-03-27 MED ORDER — MIDAZOLAM HCL 2 MG/2ML IJ SOLN
INTRAMUSCULAR | Status: AC
  Filled 2021-03-27: qty 2

## 2021-03-27 MED ORDER — OXYCODONE HCL 5 MG PO TABS
2.5000 mg | ORAL_TABLET | Freq: Four times a day (QID) | ORAL | Status: DC | PRN
  Administered 2021-03-27: 2.5 mg via ORAL
  Filled 2021-03-27: qty 1

## 2021-03-27 MED ORDER — LIDOCAINE-EPINEPHRINE (PF) 1.5 %-1:200000 IJ SOLN
INTRAMUSCULAR | Status: DC | PRN
  Administered 2021-03-27: 10 mL via PERINEURAL

## 2021-03-27 MED ORDER — LIDOCAINE 2% (20 MG/ML) 5 ML SYRINGE
INTRAMUSCULAR | Status: DC | PRN
  Administered 2021-03-27: 50 mg via INTRAVENOUS

## 2021-03-27 MED ORDER — FENTANYL CITRATE (PF) 250 MCG/5ML IJ SOLN
INTRAMUSCULAR | Status: AC
  Filled 2021-03-27: qty 5

## 2021-03-27 MED ORDER — PROPOFOL 500 MG/50ML IV EMUL
INTRAVENOUS | Status: DC | PRN
  Administered 2021-03-27: 100 ug/kg/min via INTRAVENOUS

## 2021-03-27 MED ORDER — VANCOMYCIN HCL 1000 MG IV SOLR
INTRAVENOUS | Status: AC
  Filled 2021-03-27: qty 1000

## 2021-03-27 MED ORDER — POTASSIUM CHLORIDE CRYS ER 20 MEQ PO TBCR
40.0000 meq | EXTENDED_RELEASE_TABLET | Freq: Once | ORAL | Status: AC
  Administered 2021-03-27: 40 meq via ORAL
  Filled 2021-03-27: qty 2

## 2021-03-27 MED ORDER — PHENYLEPHRINE HCL (PRESSORS) 10 MG/ML IV SOLN
INTRAVENOUS | Status: DC | PRN
  Administered 2021-03-27 (×3): 80 ug via INTRAVENOUS

## 2021-03-27 MED ORDER — CHLORHEXIDINE GLUCONATE 0.12 % MT SOLN
OROMUCOSAL | Status: AC
  Administered 2021-03-27: 15 mL via OROMUCOSAL
  Filled 2021-03-27: qty 15

## 2021-03-27 MED ORDER — ACETAMINOPHEN 325 MG PO TABS: 650.0000 mg | ORAL_TABLET | Freq: Four times a day (QID) | ORAL | Status: DC | PRN

## 2021-03-27 MED ORDER — CHLORHEXIDINE GLUCONATE 0.12 % MT SOLN: 15.0000 mL | Freq: Once | OROMUCOSAL | Status: AC

## 2021-03-27 MED ORDER — HYDROCODONE-ACETAMINOPHEN 5-325 MG PO TABS: 1.0000 | ORAL_TABLET | ORAL | Status: DC | PRN

## 2021-03-27 MED ORDER — POTASSIUM CHLORIDE IN NACL 40-0.9 MEQ/L-% IV SOLN
INTRAVENOUS | Status: DC
  Filled 2021-03-27 (×5): qty 1000

## 2021-03-27 MED ORDER — ENOXAPARIN SODIUM 40 MG/0.4ML ~~LOC~~ SOLN
40.0000 mg | SUBCUTANEOUS | Status: DC
  Administered 2021-03-28 – 2021-03-29 (×2): 40 mg via SUBCUTANEOUS
  Filled 2021-03-27 (×2): qty 0.4

## 2021-03-27 MED ORDER — HEPARIN SODIUM (PORCINE) 5000 UNIT/ML IJ SOLN: 5000.0000 [IU] | Freq: Three times a day (TID) | INTRAMUSCULAR | Status: DC

## 2021-03-27 MED ORDER — METOCLOPRAMIDE HCL 5 MG PO TABS: 5.0000 mg | ORAL_TABLET | Freq: Three times a day (TID) | ORAL | Status: DC | PRN

## 2021-03-27 MED ORDER — VANCOMYCIN HCL 1000 MG IV SOLR
INTRAVENOUS | Status: DC | PRN
  Administered 2021-03-27: 1000 mg via TOPICAL

## 2021-03-27 MED ORDER — DOCUSATE SODIUM 100 MG PO CAPS
100.0000 mg | ORAL_CAPSULE | Freq: Two times a day (BID) | ORAL | Status: DC
  Administered 2021-03-27 – 2021-03-30 (×6): 100 mg via ORAL
  Filled 2021-03-27 (×6): qty 1

## 2021-03-27 MED ORDER — BUPIVACAINE IN DEXTROSE 0.75-8.25 % IT SOLN
INTRATHECAL | Status: DC | PRN
  Administered 2021-03-27: 1.4 mL via INTRATHECAL

## 2021-03-27 MED ORDER — HYDROMORPHONE HCL 1 MG/ML IJ SOLN
0.5000 mg | Freq: Once | INTRAMUSCULAR | Status: AC
  Administered 2021-03-27: 0.5 mg via INTRAVENOUS
  Filled 2021-03-27: qty 1

## 2021-03-27 MED ORDER — METOCLOPRAMIDE HCL 5 MG/ML IJ SOLN: 5.0000 mg | Freq: Three times a day (TID) | INTRAMUSCULAR | Status: DC | PRN

## 2021-03-27 MED ORDER — AMLODIPINE BESYLATE 5 MG PO TABS
5.0000 mg | ORAL_TABLET | Freq: Every day | ORAL | Status: DC
  Administered 2021-03-27 – 2021-04-02 (×6): 5 mg via ORAL
  Filled 2021-03-27 (×6): qty 1

## 2021-03-27 MED ORDER — FENTANYL CITRATE (PF) 100 MCG/2ML IJ SOLN: 100.0000 ug | INTRAMUSCULAR | Status: AC

## 2021-03-27 MED ORDER — HYDROMORPHONE HCL 1 MG/ML IJ SOLN: 0.2500 mg | INTRAMUSCULAR | Status: DC | PRN

## 2021-03-27 MED ORDER — BUPIVACAINE-EPINEPHRINE (PF) 0.5% -1:200000 IJ SOLN
INTRAMUSCULAR | Status: DC | PRN
  Administered 2021-03-27: 20 mL via PERINEURAL

## 2021-03-27 MED ORDER — BISACODYL 5 MG PO TBEC: 5.0000 mg | DELAYED_RELEASE_TABLET | Freq: Every day | ORAL | Status: DC | PRN

## 2021-03-27 MED ORDER — ORAL CARE MOUTH RINSE: 15.0000 mL | Freq: Once | OROMUCOSAL | Status: AC

## 2021-03-27 MED ORDER — LACTATED RINGERS IV SOLN: INTRAVENOUS | Status: DC | PRN

## 2021-03-27 SURGICAL SUPPLY — 65 items
BIT DRILL 2.2 SS TIBIAL (BIT) ×1 IMPLANT
BIT DRILL INTERTAN LAG SCREW (BIT) ×1 IMPLANT
BIT DRILL LONG 4.0 (BIT) IMPLANT
BLADE SURG 15 STRL LF DISP TIS (BLADE) IMPLANT
BLADE SURG 15 STRL SS (BLADE) ×1
BNDG ELASTIC 3X5.8 VLCR STR LF (GAUZE/BANDAGES/DRESSINGS) ×1 IMPLANT
BRUSH SCRUB EZ PLAIN DRY (MISCELLANEOUS) ×6 IMPLANT
CHLORAPREP W/TINT 26 (MISCELLANEOUS) ×4 IMPLANT
COVER PERINEAL POST (MISCELLANEOUS) ×3 IMPLANT
COVER SURGICAL LIGHT HANDLE (MISCELLANEOUS) ×4 IMPLANT
DERMABOND ADVANCED (GAUZE/BANDAGES/DRESSINGS) ×1
DERMABOND ADVANCED .7 DNX12 (GAUZE/BANDAGES/DRESSINGS) ×2 IMPLANT
DRAPE C-ARM 42X72 X-RAY (DRAPES) ×4 IMPLANT
DRAPE IMP U-DRAPE 54X76 (DRAPES) ×7 IMPLANT
DRAPE INCISE IOBAN 66X45 STRL (DRAPES) ×3 IMPLANT
DRAPE STERI IOBAN 125X83 (DRAPES) ×3 IMPLANT
DRAPE SURG 17X23 STRL (DRAPES) ×5 IMPLANT
DRAPE U-SHAPE 47X51 STRL (DRAPES) ×3 IMPLANT
DRILL BIT LONG 4.0 (BIT) ×3
DRSG MEPILEX BORDER 4X4 (GAUZE/BANDAGES/DRESSINGS) ×3 IMPLANT
DRSG MEPILEX BORDER 4X8 (GAUZE/BANDAGES/DRESSINGS) ×3 IMPLANT
ELECT REM PT RETURN 9FT ADLT (ELECTROSURGICAL) ×3
ELECTRODE REM PT RTRN 9FT ADLT (ELECTROSURGICAL) ×2 IMPLANT
GLOVE SURG LTX SZ6.5 (GLOVE) ×3 IMPLANT
GLOVE SURG LTX SZ7.5 (GLOVE) ×3 IMPLANT
GLOVE SURG UNDER POLY LF SZ6.5 (GLOVE) ×3 IMPLANT
GLOVE SURG UNDER POLY LF SZ7.5 (GLOVE) ×1 IMPLANT
GOWN STRL REUS W/ TWL LRG LVL3 (GOWN DISPOSABLE) ×4 IMPLANT
GOWN STRL REUS W/ TWL XL LVL3 (GOWN DISPOSABLE) ×2 IMPLANT
GOWN STRL REUS W/TWL LRG LVL3 (GOWN DISPOSABLE) ×2
GOWN STRL REUS W/TWL XL LVL3 (GOWN DISPOSABLE) ×3
GUIDE PIN 3.2X343 (PIN) ×4
GUIDE PIN 3.2X343MM (PIN) ×2
K-WIRE 1.6 (WIRE) ×2
K-WIRE FX5X1.6XNS BN SS (WIRE) ×4
KIT BASIN OR (CUSTOM PROCEDURE TRAY) ×3 IMPLANT
KIT TURNOVER KIT B (KITS) ×3 IMPLANT
KWIRE FX5X1.6XNS BN SS (WIRE) IMPLANT
MANIFOLD NEPTUNE II (INSTRUMENTS) ×3 IMPLANT
NAIL INTERTAN 10X18 130D 10S (Nail) ×1 IMPLANT
NS IRRIG 1000ML POUR BTL (IV SOLUTION) ×3 IMPLANT
PACK GENERAL/GYN (CUSTOM PROCEDURE TRAY) ×3 IMPLANT
PAD ARMBOARD 7.5X6 YLW CONV (MISCELLANEOUS) ×6 IMPLANT
PIN GUIDE 3.2X343MM (PIN) IMPLANT
PLATE NARROW DVR LEFT (Plate) ×1 IMPLANT
SCREW LAG COMPR KIT 95/90 (Screw) ×1 IMPLANT
SCREW LOCK 14X2.7X 3 LD TPR (Screw) IMPLANT
SCREW LOCK 16X2.7X 3 LD TPR (Screw) IMPLANT
SCREW LOCK 22X2.7X 3 LD TPR (Screw) IMPLANT
SCREW LOCKING 2.7X14 (Screw) ×1 IMPLANT
SCREW LOCKING 2.7X15MM (Screw) ×1 IMPLANT
SCREW LOCKING 2.7X16 (Screw) ×3 IMPLANT
SCREW LOCKING 2.7X22MM (Screw) ×1 IMPLANT
SCREW MULTI DIRECTIONAL 2.7X22 (Screw) ×2 IMPLANT
SCREW MULTI DIRECTIONAL 2.7X24 (Screw) ×2 IMPLANT
SCREW TRIGEN LOW PROF 5.0X35 (Screw) ×1 IMPLANT
SLING ARM FOAM STRAP MED (SOFTGOODS) ×1 IMPLANT
SPONGE LAP 18X18 RF (DISPOSABLE) ×1 IMPLANT
STRIP CLOSURE SKIN 1/2X4 (GAUZE/BANDAGES/DRESSINGS) ×1 IMPLANT
SUT MNCRL AB 3-0 PS2 18 (SUTURE) ×4 IMPLANT
SUT VIC AB 2-0 CT1 27 (SUTURE) ×2
SUT VIC AB 2-0 CT1 TAPERPNT 27 (SUTURE) ×4 IMPLANT
TOWEL GREEN STERILE (TOWEL DISPOSABLE) ×5 IMPLANT
UNDERPAD 30X36 HEAVY ABSORB (UNDERPADS AND DIAPERS) ×3 IMPLANT
WATER STERILE IRR 1000ML POUR (IV SOLUTION) ×3 IMPLANT

## 2021-03-27 NOTE — Progress Notes (Signed)
Patient transported to OR via bed by orderly at 1030 on 03/27/21.

## 2021-03-27 NOTE — ED Triage Notes (Signed)
EMS reports that pt is from [facility]- Pt was standing up, going to pull briefs up, then saw a spider on the floor, bent over to attempt to catch spider, fell. Deformities to Left upper extremities, left hip deformity- shortened and rotated. Pt takes eliquis. Denies head injury.

## 2021-03-27 NOTE — ED Notes (Signed)
Patient transported to CT 

## 2021-03-27 NOTE — Consult Note (Signed)
Orthopaedic Trauma Service (OTS) Consult   Patient ID: Savannah Coleman MRN: [id number] DOB/AGE: 85-Sep-1932 85 y.o.  Reason for Consult:Multiple Fracture Referring Physician: Dr. Victorino December, MD [facility]  HPI: Savannah Coleman is an 87 y.o. female who is being seen in consultation at the request of Dr. Stann Mainland for evaluation of left hip fracture, left distal radius fracture and left proximal humerus fracture.  Patient resides at [facility].  She saw a spider in her bathroom was trying to get it and she unfortunately fell and landed on her left side.  She had immediate pain and deformity inability bear weight.  She was brought to the emergency room and x-ray showed a left hip fracture left distal radius fracture and a left proximal humerus fracture.  Due to the OR availability Dr. Stann Mainland asked that I assist with care of the patient.  Patient was seen and evaluated in the preoperative holding area.  She is currently comfortable but having a lot of pain in her wrist.  She had a right hip fracture that was performed by my partner, Dr. Marcelino Scot, in May of last year.  She was ambulating with the use of a assist device.  Patient does take narcotic medications at baseline.  No other issues of note no pain in her right arm or right leg.  Past Medical History:  Diagnosis Date  . Anemia   . Anxiety   . Constipation   . COPD (chronic obstructive pulmonary disease) ([place])   . Heartburn   . High cholesterol   . History of vertebral fracture   . Hypertension   . Insomnia   . Recurrent falls 03/27/2021  . Resides in long term care facility 03/27/2021    Past Surgical History:  Procedure Laterality Date  . HIP ARTHROPLASTY Right   . LUMBAR FUSION    . VERTEBROPLASTY     Lumbar vertebra  . WRIST SURGERY Bilateral     History reviewed. No pertinent family history.  Social History:  reports that she has never smoked. She has never used smokeless tobacco. She reports previous alcohol use. She  reports previous drug use.  Allergies:  Allergies  Allergen Reactions  . Morphine And Related Nausea Only    Medications:  No current facility-administered medications on file prior to encounter.   Current Outpatient Medications on File Prior to Encounter  Medication Sig Dispense Refill  . acetaminophen (TYLENOL) 325 MG tablet Take 325 mg by mouth every 4 (four) hours as needed for moderate pain.    Marland Kitchen albuterol (PROVENTIL) (2.5 MG/3ML) 0.083% nebulizer solution Take 2.5 mg by nebulization every 4 (four) hours as needed for wheezing or shortness of breath.    Marland Kitchen alum & mag hydroxide-simeth (MAALOX PLUS) 400-400-40 MG/5ML suspension Take 30 mLs by mouth as directed. Give 30 ml once a week on Mondays & Give 30 mls by mouth every 6 hours PRN for Indigestion, Heartburn, Gas, Nausea    . amLODipine (NORVASC) 5 MG tablet Take 1 tablet by mouth daily.    . bisacodyl (DULCOLAX) 5 MG EC tablet Take 5 mg by mouth daily as needed for moderate constipation.    . calcitonin, salmon, (MIACALCIN/FORTICAL) 200 UNIT/ACT nasal spray Place 1 spray into alternate nostrils daily.    . calcium carbonate (TUMS - DOSED IN MG ELEMENTAL CALCIUM) 500 MG chewable tablet Chew 1 tablet by mouth every 8 (eight) hours as needed for indigestion.    . calcium-vitamin D (OSCAL WITH D) 500-200 MG-UNIT tablet Take 1 tablet  by mouth.    . docusate sodium (COLACE) 100 MG capsule Take 100 mg by mouth 2 (two) times daily.    . ferrous sulfate 325 (65 FE) MG EC tablet Take 325 mg by mouth daily with breakfast.    . HYDROcodone-acetaminophen (NORCO) 10-325 MG tablet Take 1 tablet by mouth at bedtime.    Marland Kitchen HYDROcodone-acetaminophen (NORCO/VICODIN) 5-325 MG tablet Take 1 tablet by mouth in the morning.    . Lido-PE-Glycerin-Petrolatum (PREPARATION H RAPID RELIEF) 5-0.25-14.4-15 % CREA Apply 1 application topically every 8 (eight) hours as needed (hemorrhoids).    . melatonin 3 MG TABS tablet Take 3 mg by mouth at bedtime as needed  (sleep).    . meloxicam (MOBIC) 7.5 MG tablet Take 7.5 mg by mouth daily.    . metoprolol succinate (TOPROL-XL) 25 MG 24 hr tablet Take 12.5 mg by mouth daily.    . Multiple Vitamin (MULTIVITAMIN ADULT) TABS Take 1 tablet by mouth daily.    . nitroGLYCERIN (NITROSTAT) 0.4 MG SL tablet Place 0.4 mg under the tongue every 5 (five) minutes as needed for chest pain.    Marland Kitchen ondansetron (ZOFRAN) 4 MG tablet Take 4 mg by mouth daily as needed for nausea or vomiting.    . pantoprazole (PROTONIX) 40 MG tablet Take 1 tablet by mouth daily.    . polyethylene glycol (MIRALAX / GLYCOLAX) 17 g packet Take 17 g by mouth daily.    . simvastatin (ZOCOR) 20 MG tablet Take 20 mg by mouth at bedtime.    Marland Kitchen tiZANidine (ZANAFLEX) 2 MG tablet Take 2 mg by mouth every 8 (eight) hours as needed for muscle spasms.    . vitamin C (ASCORBIC ACID) 500 MG tablet Take 500 mg by mouth daily.      ROS: Constitutional: No fever or chills Vision: No changes in vision ENT: No difficulty swallowing CV: No chest pain Pulm: No SOB or wheezing GI: No nausea or vomiting GU: No urgency or inability to hold urine Skin: No poor wound healing Neurologic: No numbness or tingling Psychiatric: No depression or anxiety Heme: No bruising Allergic: No reaction to medications or food   Exam: Blood pressure (!) 166/88, pulse 97, temperature 98.4 F (36.9 C), temperature source Oral, resp. rate 18, height 5\' 2"  (1.575 m), weight 52.6 kg, SpO2 98 %. General: No acute distress Orientation: Awake alert and oriented x3 Mood and Affect: Cooperative and pleasant Gait: Unable to assess due to her fracture Coordination and balance: Within normal limits  Left lower extremity: Leg is shortened and externally rotated.  Obvious deformity at the thigh with notable swelling.  No skin lesions.  No tenderness about the distal femur.  No knee effusion.  Compartments are soft compressible.  Active dorsiflexion plantarflexion of the toes and foot.  She  has sensation intact to light touch to the dorsum and plantar aspect of her foot and warm well-perfused foot as well.  No lymphadenopathy.  Reflexes within normal limits.  Left upper extremity: Shoulder has significant swelling and ecchymosis around it.  Pain with any attempted range of motion.  Compartments are soft compressible.  Sugar tong splint is in place.  Is clean dry and intact.  She is able to wiggle her fingers but is limited secondary to pain.  She endorses sensation to the median, radial and ulnar nerve distribution.  She has brisk cap refill of less than 2 seconds.  Right upper extremity and right lower extremity: Skin without lesions. No tenderness to palpation. Full painless  ROM, full strength in each muscle groups without evidence of instability.   Medical Decision Making: Data: Imaging: X-rays of the left hip show a basicervical femoral neck fracture with significant displacement and varus angulation.  X-rays of the left shoulder show a 2 part proximal humerus fracture through the surgical neck.  Significant varus angulation.  X-rays of the left wrist show comminuted and displaced distal radius and distal ulna fracture with significant displacement and dorsal angulation and translation of the carpus.  Labs:  Results for orders placed or performed during the hospital encounter of 03/26/21 (from the past 24 hour(s))  Type and screen [place]     Status: None   Collection Time: 03/26/21 11:57 PM  Result Value Ref Range   ABO/RH(D) O POS    Antibody Screen NEG    Sample Expiration      03/29/2021,2359 Performed at [facility], [place] [address], [place], [place] [address]   Comprehensive metabolic panel     Status: Abnormal   Collection Time: 03/27/21 12:03 AM  Result Value Ref Range   Sodium 137 135 - 145 mmol/L   Potassium 2.8 (L) 3.5 - 5.1 mmol/L   Chloride 102 98 - 111 mmol/L   CO2 28 22 - 32 mmol/L   Glucose, Bld 150 (H) 70 - 99 mg/dL   BUN  9 8 - 23 mg/dL   Creatinine, Ser 0.70 0.44 - 1.00 mg/dL   Calcium 9.4 8.9 - 10.3 mg/dL   Total Protein 6.5 6.5 - 8.1 g/dL   Albumin 3.9 3.5 - 5.0 g/dL   AST 28 15 - 41 U/L   ALT 22 0 - 44 U/L   Alkaline Phosphatase 66 38 - 126 U/L   Total Bilirubin 0.6 0.3 - 1.2 mg/dL   GFR, Estimated >60 >60 mL/min   Anion gap 7 5 - 15  Ethanol     Status: None   Collection Time: 03/27/21 12:03 AM  Result Value Ref Range   Alcohol, Ethyl (B) <10 <10 mg/dL  Protime-INR     Status: None   Collection Time: 03/27/21 12:03 AM  Result Value Ref Range   Prothrombin Time 13.3 11.4 - 15.2 seconds   INR 1.1 0.8 - 1.2  CBC with Differential     Status: Abnormal   Collection Time: 03/27/21 12:03 AM  Result Value Ref Range   WBC 9.3 4.0 - 10.5 K/uL   RBC 4.10 3.87 - 5.11 MIL/uL   Hemoglobin 13.6 12.0 - 15.0 g/dL   HCT 41.2 36.0 - 46.0 %   MCV 100.5 (H) 80.0 - 100.0 fL   MCH 33.2 26.0 - 34.0 pg   MCHC 33.0 30.0 - 36.0 g/dL   RDW 12.5 11.5 - 15.5 %   Platelets 241 150 - 400 K/uL   nRBC 0.0 0.0 - 0.2 %   Neutrophils Relative % 66 %   Neutro Abs 6.1 1.7 - 7.7 K/uL   Lymphocytes Relative 24 %   Lymphs Abs 2.2 0.7 - 4.0 K/uL   Monocytes Relative 7 %   Monocytes Absolute 0.7 0.1 - 1.0 K/uL   Eosinophils Relative 2 %   Eosinophils Absolute 0.2 0.0 - 0.5 K/uL   Basophils Relative 0 %   Basophils Absolute 0.0 0.0 - 0.1 K/uL   Immature Granulocytes 1 %   Abs Immature Granulocytes 0.09 (H) 0.00 - 0.07 K/uL  Magnesium     Status: None   Collection Time: 03/27/21 12:03 AM  Result Value  Ref Range   Magnesium 1.8 1.7 - 2.4 mg/dL  ABO/Rh     Status: None   Collection Time: 03/27/21 12:09 AM  Result Value Ref Range   ABO/RH(D)      O POS Performed at [facility] [address] [place], [place] [address]   SARS CORONAVIRUS 2 (TAT 6-24 HRS) Nasopharyngeal Nasopharyngeal Swab     Status: None   Collection Time: 03/27/21 12:55 AM   Specimen: Nasopharyngeal Swab  Result Value Ref Range   SARS  Coronavirus 2 NEGATIVE NEGATIVE  MRSA PCR Screening     Status: None   Collection Time: 03/27/21  7:41 AM   Specimen: Nasopharyngeal  Result Value Ref Range   MRSA by PCR NEGATIVE NEGATIVE  Vitamin B12     Status: None   Collection Time: 03/27/21  9:03 AM  Result Value Ref Range   Vitamin B-12 421 180 - 914 pg/mL  Folate     Status: None   Collection Time: 03/27/21  9:03 AM  Result Value Ref Range   Folate 19.8 >5.9 ng/mL    Imaging or Labs ordered: None  Medical history and chart was reviewed and case discussed with medical provider.  Assessment/Plan: 87 year old female status post ground-level fall with a multiple orthopedic injuries including:  1.  Left basicervical femoral neck/intertrochanteric femur fracture 2.  Left surgical neck proximal humerus fracture 3.  Left significantly displaced distal radius/distal ulna fracture  Due to the unstable nature of her injury I recommend proceeding with cephalomedullary nailing of the left hip.  I also recommend open reduction internal fixation of the left distal radius.  Due to the amount of displacement of her proximal humerus and the varus angulation and her poor bone quality I will recommend likely reverse total shoulder arthroplasty.  I did discuss with the patient and her daughter that we could fix this however it would be high risk for failure and would be high risk for stiffness as well as limited weightbearing.  I discussed risk and benefits of the hip and the wrist.  Risks included but not limited to bleeding, infection, malunion, nonunion, hardware failure, hardware irritation, nerve or blood vessel injury, DVT, even the possibility anesthetic complications.  The patient agreed to proceed with surgery and consent was obtained.  The patient will likely undergo reverse total shoulder arthroplasty with Dr. Stann Mainland later in the week.  70 minutes was spent in face-to-face evaluation and care including discussion of risks and benefits,  examining the patient, reviewing the imaging, discussion with Dr. Stann Mainland for plan of care, discussion with [facility] regarding plan of care, and with Dr. Ermalene Postin for anesthesia plan.  Shona Needles, MD [facility] [phone] (office) [email or web address]

## 2021-03-27 NOTE — Anesthesia Procedure Notes (Addendum)
Anesthesia Regional Block: Supraclavicular block   Pre-Anesthetic Checklist: ,, timeout performed, Correct Patient, Correct Site, Correct Laterality, Correct Procedure, Correct Position, site marked, Risks and benefits discussed,  Surgical consent,  Pre-op evaluation,  At surgeon's request and post-op pain management  Laterality: Left and Upper  Prep: chloraprep       Needles:  Injection technique: Single-shot     Needle Length: 5cm  Needle Gauge: 22     Additional Needles: Arrow StimuQuik ECHO Echogenic Stimulating PNB Needle  Procedures:,,,, ultrasound used (permanent image in chart),,,,  Narrative:  Start time: 03/27/2021 11:24 AM End time: 03/27/2021 11:31 AM Injection made incrementally with aspirations every 5 mL.  Performed by: Personally  Anesthesiologist: Oleta Mouse, MD

## 2021-03-27 NOTE — Op Note (Signed)
Orthopaedic Surgery Operative Note (CSN: [id number] ) Date of Surgery: 03/27/2021  Admit Date: 03/26/2021   Diagnoses: Pre-Op Diagnoses: Left intertrochanteric femur fracture Left distal radius/ulna fracture Left proximal humerus fracture  Post-Op Diagnosis: Same  Procedures: 1. CPT 27245-Cephalomedulary nailing of left intertrochanteric femur fracture 2. CPT 25608-Open reduction internal fixation of left intra-articular distal radius fracture 3. CPT 25535-Closed reduction of left distal ulnar shaft fracture  Surgeons : Primary: Shona Needles, MD  Assistant: Patrecia Pace, PA-C  Location: OR 4   Anesthesia:Epidural and regional anesthesia  Antibiotics: Ancef 2g preop with 1 gm vancomycin powder placed topically in distal radius incision   Tourniquet time: None used  Estimated Blood NWGN:56 mL  Complications:None  Specimens:None   Implants: Implant Name Type Inv. Item Serial No. Manufacturer Lot No. LRB No. Used Action  NAIL INTERTAN 10X18 130D 10S - [id number] Nail NAIL INTERTAN 10X18 130D 10S  [employer] [id number] Left 1 Implanted  SCREW LAG COMBO 95.90 - [id number] Screw SCREW LAG COMBO 95.90  [employer] [id number] Left 1 Implanted  SCREW TRIGEN LOW PROF 5.0X35 - [id number] Screw SCREW TRIGEN LOW PROF 5.0X35  [employer] [id number] Left 1 Implanted  SCREW LOCKING 2.7X16 - [id number] Screw SCREW LOCKING 2.7X16  [employer]IM[employer]ON(ORTH,TRAU,BIO,SG)  Left 3 Implanted  SCREW MULTI DIRECTIONAL 2.7X22 - [id number] Screw SCREW MULTI DIRECTIONAL 2.7X22  [employer](ORTH,TRAU,BIO,SG)  Left 2 Implanted  SCREW MULTI DIRECTIONAL 2.7X24 - [id number] Screw SCREW MULTI DIRECTIONAL 2.7X24  [employer]IM[employer]ON(ORTH,TRAU,BIO,SG)  Left 2 Implanted     Indications for Surgery: 87 year old female who sustained a ground-level fall has a left intertrochanteric femur fracture, left distal radius and ulna fracture, and left proximal humerus fracture.  I  recommend proceeding with open reduction internal fixation of left distal radius as well as a cephalomedullary nailing of the left intertrochanteric femur fracture.  I felt that her left proximal humerus would be best treated with a reverse total shoulder arthroplasty which Dr. Stann Mainland will manage later in the week.  I discussed risks and benefits with the patient and her daughter.  Risks included but not limited to bleeding, infection, malunion, nonunion, hardware failure, hardware irritation, nerve or blood vessel injury, DVT, even the possibility anesthetic complications.  The patient agreed to proceed with surgery and consent was obtained.  Operative Findings: 1.  Cephalomedullary nailing of left intertrochanteric femur fracture using [employer] InterTAN 10 x 180 mm nail with a 95 mm lag screw and a 90 mm compression screw. 2.  Open reduction internal fixation of left intra-articular distal radius fracture using [employer] DVR distal radius plate. 3.  Closed reduction and treatment of left distal ulnar shaft fracture.  Procedure: The patient was identified in the preoperative holding area. Consent was confirmed with the patient and their family and all questions were answered. The operative extremity was marked after confirmation with the patient. she was then brought back to the operating room by our anesthesia colleagues.  She had undergone regional anesthesia for a left upper extremity preoperatively.  She was placed on her side and given an epidural anesthesia.  She was then carefully transferred over to the [facility] table.  All bony prominences were well-padded.  Fluoroscopic imaging was obtained and traction was applied to the lower extremity and adequate reduction was obtained.  The left lower extremity was then prepped and draped in usual sterile fashion.  Timeout was performed to verify the patient, the procedure, and the extremity.  Preoperative antibiotics were dosed.  A small incision  proximal to the greater trochanter was made and carried down through the gluteal fascia.  I directed a threaded guide point at the tip of the greater trochanter and advanced into the metaphysis.  I used an entry reamer to enter the medullary canal once I confirmed position with AP and lateral fluoroscopic imaging.  I then placed the 10 x 180 mm nail down the center of the canal.  I seated it until it was appropriately positioned.  I then used the targeting arm to place a lateral incision.  I directed a threaded guidewire into the head neck segment.  I confirmed adequate tip apex distance with AP and lateral fluoroscopic imaging.  I then measured the length and chose to use a 95 mm lag screw.  I drilled the path for the compression screw placed an antirotation bar.  I then drilled the path for the lag screw and placed the lag screw.  I then placed the compression screw and obtained approximately 7 mm of compression.  Excellent fixation was obtained.  The nail was statically locked.  I then used the targeting arm to place a lateral to medial distal interlocking screw.  Final fluoroscopic imaging was obtained.  The incisions were copiously irrigated.  A layer closure of 2-0 Vicryl and 3-0 Monocryl with Dermabond was used to close the skin.  Sterile dressings were placed.  The patient was then positioned with her left upper extremity on the arm board.  The left upper extremity was then prepped and draped in usual sterile fashion.  A timeout was performed to verify the patient, the procedure, and the extremity.  Fluoroscopic imaging was obtained to show the unstable nature of her injury.  A standard FCR approach was carried down through skin subcutaneous tissue.  Identified the FCR tendon and incised through the volar sheath and then mobilized the tendon and incised through the dorsal sheath.  I then incised through the pronator quadratus to expose the volar cortex of the distal radius.  I was able to use a freer  elevator to manipulate the distal fragment into reduction and I was able to held it to hold it there provisionally.  I then positioned a narrow [employer] DVR distal radius plate and held provisionally with K wires distally and proximally.  I confirmed adequate positioning of the plate with fluoroscopy.  I then placed a nonlocking screw into the radial shaft.  I then used fluoroscopic imaging to help guide a multidirectional screws distally to remain extra-articular and can purchase into the dorsal cortex.  I returned to the proximal segment and placed 3 locking screws.  Final fluoroscopic imaging was obtained.  I felt that the distal ulna was lined up well after reduction of the distal radius.  I did not feel any fixation was required with the distal ulna.  As result I treated nonoperatively.  The incision was copiously irrigated.  A gram of vancomycin powder was placed into the incision.  A layer closure of 2-0 Vicryl and 3-0 Monocryl with Dermabond was used to close the skin.  A well-padded short arm splint was applied.  The patient was then awoken from anesthesia and taken to the PACU in stable condition.  Post Op Plan/Instructions: Patient will be weightbearing as tolerated to the left lower extremity.  She will be nonweightbearing to the left upper extremity.  Dr. Stann Mainland will plan for reverse total shoulder arthroplasty likely Wednesday of this week.  She will receive postoperative Ancef.  She  will receive Lovenox for DVT prophylaxis.  We will have her mobilize with physical and occupational therapy as she tolerates.  I was present and performed the entire surgery.  Patrecia Pace, PA-C did assist me throughout the case. An assistant was necessary given the difficulty in approach, maintenance of reduction and ability to instrument the fracture.   Katha Hamming, MD [facility]

## 2021-03-27 NOTE — Progress Notes (Signed)
Patient transported back to room via bed by orderly at 1630 on 03/27/21.

## 2021-03-27 NOTE — Anesthesia Preprocedure Evaluation (Signed)
Anesthesia Evaluation  Patient identified by MRN, date of birth, ID band Patient awake    Reviewed: Allergy & Precautions, NPO status , Patient's Chart, lab work & pertinent test results  History of Anesthesia Complications Negative for: history of anesthetic complications  Airway Mallampati: II  TM Distance: >3 FB Neck ROM: Full    Dental  (+) Dental Advisory Given, Teeth Intact   Pulmonary neg shortness of breath, neg sleep apnea, COPD,  COPD inhaler, neg recent URI,  Covid-19 Nucleic Acid Test Results Lab Results      Component                Value               Date                      SARSCOV2NAA              NEGATIVE            03/27/2021              breath sounds clear to auscultation       Cardiovascular hypertension, Pt. on medications and Pt. on home beta blockers  Rhythm:Regular     Neuro/Psych PSYCHIATRIC DISORDERS Anxiety negative neurological ROS     GI/Hepatic Neg liver ROS, GERD  Medicated,  Endo/Other  negative endocrine ROS  Renal/GU negative Renal ROSLab Results      Component                Value               Date                      CREATININE               0.70                03/27/2021                Musculoskeletal Left hip fx, left prox humerus fx, left hip fx   Abdominal   Peds  Hematology negative hematology ROS (+) Lab Results      Component                Value               Date                      WBC                      9.3                 03/27/2021                HGB                      13.6                03/27/2021                HCT                      41.2                03/27/2021                MCV  100.5 (H)           03/27/2021                PLT                      241                 03/27/2021              Anesthesia Other Findings   Reproductive/Obstetrics                             Anesthesia  Physical Anesthesia Plan  ASA: II  Anesthesia Plan: MAC, Regional and Spinal   Post-op Pain Management:    Induction: Intravenous  PONV Risk Score and Plan: 2 and Propofol infusion and Treatment may vary due to age or medical condition  Airway Management Planned: Nasal Cannula  Additional Equipment: None  Intra-op Plan:   Post-operative Plan:   Informed Consent: I have reviewed the patients History and Physical, chart, labs and discussed the procedure including the risks, benefits and alternatives for the proposed anesthesia with the patient or authorized representative who has indicated his/her understanding and acceptance.     Dental advisory given  Plan Discussed with: CRNA and Surgeon  Anesthesia Plan Comments:         Anesthesia Quick Evaluation

## 2021-03-27 NOTE — Anesthesia Procedure Notes (Signed)
Spinal  Patient location during procedure: OR Start time: 03/27/2021 12:17 PM End time: 03/27/2021 12:27 PM Reason for block: surgical anesthesia Staffing Performed: anesthesiologist  Anesthesiologist: Oleta Mouse, MD Preanesthetic Checklist Completed: patient identified, IV checked, risks and benefits discussed, surgical consent, monitors and equipment checked, pre-op evaluation and timeout performed Spinal Block Patient position: right lateral decubitus Prep: DuraPrep Patient monitoring: heart rate, cardiac monitor, continuous pulse ox and blood pressure Approach: midline Location: L3-4 Injection technique: single-shot Needle Needle type: Pencan  Needle gauge: 24 G Needle length: 9 cm Assessment Sensory level: T6 Events: CSF return Additional Notes Attempt x1 at L 4/5, Attempt x 2 at L5/S1 despite feeling dural pop no csf obtained. Scant csf at L3/4 with adequate block achieved after injection of local

## 2021-03-27 NOTE — H&P (Addendum)
[facility] Admission History and Physical Service Pager: [phone]  Patient name: Savannah Coleman Medical record number: [id number] Date of birth: 10/18/1931 Age: 87 y.o. Gender: female  Primary Care Provider: No primary care provider on file. Consultants: ortho Code Status: full Preferred Emergency Contact: daughter  Chief Complaint: fxs of L proximal humerus, impacted L basicervical femur, L distal radius and ulna after fall  Assessment and Plan: Savannah Coleman is a 87 y.o. female presenting with fall resulting in multiple fractures. PMH is significant for COPD, HLD, HTN, R hip arthroplasty, lumbar fusion, bilateral wrist surgery  Fall with multiple fx- new fxs of L proximal humerus, impacted L basicervical femur, L distal radius and ulna seen on x-ray after fall in bathroom at SNF. Head and neck CT showed no acute intracranial or c-spine injury.  Patient is alert and oriented and denies loss of consciousness throughout accident.  Describes pain 10/10. Risk factors: H/o multiple falls and fractures- 04/2020 had fall in shower resulting in R hip and wrist fxs, PDMP review reveals Overdose risk score 460 but last known prescription of controlled substance in database is from 08/2020. Ortho and hand surgery are following and planning surgical interventions.  Already typed and screened and n.p.o in preparation for surgery.  PATIENT DENIES ANY BLOOD THINNERS INCLUDING ASA. MED REC FROM SNF CONFIRMS NO BLOOD THINNERS. No previous dexa scan on chart review.  Appears to be taking calcium supplement daily. - admit to medsurg, attending Dr. McDiarmid - appreciate ortho recs - early mobilization  -PT/OT ordered to start after surgery. - IV maintenance fluids while NPO -Follow-up asymptomatic Covid screening -Tylenol 650 mg as needed -Dilaudid x1 -Hydrocodone 5/325 1 to 2 tablets every 4 hours as needed - scheduled miralax and PRN dulcolax/senna -Continue  splint  Hypokalemia- K+ 2.8 on admission. 14mEq PO + 40MeQ IV ordered in ED. Looks like she has been on K+ replacement in the past - BMP am - f/u magnesium -Potassium replacement p.o. and in IV maintenance fluids  HLD- home meds: Simvastatin 20mg . No recent lipid panel in chart. Appears to be given as primary prevention. Would consider discontinuing.  -Holding simvastatin for now  HTN- home meds: Amlodipine 5mg , ? metoprolol. Elevated BP on admission to 189/110 most recently. Patient denies heart disease. - continue home amlodipine  GERD-Home meds: Pantoprazole.  -Pepcid as needed  FEN/GI: NPO until surgery schedule confirmed and then regular diet Prophylaxis: SCDs, heparin  Disposition: medsurg  History of Present Illness:  Savannah Coleman is a 87 y.o. female presenting with fall resulting in left-sided humeral, femur, wrist fractures.  Patient is a resident at [facility] and was her otherwise normal self yesterday.  She went to the bathroom and as she was standing up and pulling her briefs up, she saw a spider on the floor.  She used some tissue paper to pick up the spider on her right hand side but fell over onto the floor.  She immediately had severe pain in her arm and leg and was unable to stand up again.  She denies hitting her head or having any loss of consciousness.  She called out for help but nobody could hear her.  So, she was able to move herself over to the door and open it so that her roommate was able to notice her and get help.  Patient has had several fractures in the past including right hip and wrist fracture last year.  She denies being on any blood thinners.  She does  not currently have any headache or visual disturbances.  She endorses 10 out of 10 significant pain in her left arm and hip.  She states that she just cannot find a comfortable position to lay her arm in and cannot move her left leg when she tries.  Review Of Systems: Per HPI with the following additions:  Review  of Systems  Constitutional: Negative for activity change.  HENT: Negative.   Eyes: Negative.   Respiratory: Negative.   Cardiovascular: Negative.   Gastrointestinal: Negative.   Genitourinary: Positive for decreased urine volume.  Neurological: Negative.   Hematological: Bruises/bleeds easily.     Patient Active Problem List   Diagnosis Date Noted  . Multiple fractures 03/27/2021    Past Medical History: Past Medical History:  Diagnosis Date  . Anemia   . Anxiety   . Constipation   . COPD (chronic obstructive pulmonary disease) ([place])   . Heartburn   . High cholesterol   . Hypertension   . Insomnia     Past Surgical History: Past Surgical History:  Procedure Laterality Date  . HIP ARTHROPLASTY Right   . LUMBAR FUSION    . WRIST SURGERY Bilateral     Social History: Social History   Tobacco Use  . Smoking status: Never Smoker  . Smokeless tobacco: Never Used  Vaping Use  . Vaping Use: Never used  Substance Use Topics  . Alcohol use: Not Currently  . Drug use: Not Currently   Additional social history: lives at [place]  Please also refer to relevant sections of EMR.  Family History: History reviewed. No pertinent family history. Non-contributory   Allergies and Medications: No Known Allergies No current facility-administered medications on file prior to encounter.   Current Outpatient Medications on File Prior to Encounter  Medication Sig Dispense Refill  . amLODipine (NORVASC) 5 MG tablet Take 1 tablet by mouth daily.    Marland Kitchen HYDROcodone-acetaminophen (NORCO) 10-325 MG tablet Take 1 tablet by mouth 2 (two) times daily as needed.    . meloxicam (MOBIC) 7.5 MG tablet Take 7.5 mg by mouth daily.    . metoprolol succinate (TOPROL-XL) 25 MG 24 hr tablet Take 0.5 tablets by mouth daily.    . pantoprazole (PROTONIX) 40 MG tablet Take 1 tablet by mouth daily.    Marland Kitchen tiZANidine (ZANAFLEX) 2 MG tablet Take 2 mg by mouth 3 (three) times daily.       Objective: BP (!) 189/110   Pulse 97   Temp (!) 97.2 F (36.2 C) (Oral)   Resp 13   Ht 5\' 2"  (1.575 m)   Wt 52.6 kg   SpO2 99%   BMI 21.22 kg/m  Exam: Physical Exam Vitals and nursing note reviewed.  Constitutional:      General: She is not in acute distress.    Appearance: She is not diaphoretic.  HENT:     Head: Normocephalic.     Nose: Nose normal.     Mouth/Throat:     Mouth: Mucous membranes are dry.     Pharynx: No posterior oropharyngeal erythema.  Eyes:     Conjunctiva/sclera: Conjunctivae normal.  Cardiovascular:     Rate and Rhythm: Normal rate and regular rhythm.     Pulses: Normal pulses.     Heart sounds: Normal heart sounds.  Pulmonary:     Effort: Pulmonary effort is normal.     Breath sounds: Normal breath sounds.  Abdominal:     General: Abdomen is flat.  Palpations: Abdomen is soft.     Tenderness: There is no abdominal tenderness.  Musculoskeletal:        General: Tenderness, deformity and signs of injury present.     Right shoulder: Normal.     Left shoulder: Tenderness and bony tenderness present. No deformity.     Right upper arm: Normal.     Left upper arm: Deformity and tenderness present. No swelling or edema.     Right elbow: Normal.     Left elbow: No swelling, deformity or effusion.     Right forearm: Normal.     Right wrist: Normal.     Right hand: Normal.     Left hand: Normal.     Cervical back: No rigidity or tenderness.     Right hip: Normal.     Left hip: Deformity present.     Right lower leg: Normal. No edema.     Left lower leg: No edema.  Skin:    General: Skin is warm and dry.     Capillary Refill: Capillary refill takes less than 2 seconds.     Findings: Bruising (LEs) present.  Neurological:     Mental Status: She is alert and oriented to person, place, and time. Mental status is at baseline.  Psychiatric:        Mood and Affect: Mood normal.    Labs and Imaging: CBC BMET  Recent Labs  Lab  03/27/21 0003  WBC 9.3  HGB 13.6  HCT 41.2  PLT 241   Recent Labs  Lab 03/27/21 0003  NA 137  K 2.8*  CL 102  CO2 28  BUN 9  CREATININE 0.70  GLUCOSE 150*  CALCIUM 9.4     EKG: NSR  DG Wrist 2 Views Left  Result Date: 03/27/2021 CLINICAL DATA:  Fall with wrist deformity EXAM: LEFT WRIST - 2 VIEW COMPARISON:  04/30/2020 FINDINGS: Acute comminuted fracture involving the distal ulna with 1 shaft diameter dorsal displacement and moderate dorsal and radial angulation of distal fracture fragment. Fracture lucency also involves the ulnar styloid. Acute comminuted distal radius fracture with 1/2 shaft diameter radial and 1 shaft diameter dorsal displacement. There is moderate radial and dorsal angulation of distal fracture fragments. IMPRESSION: Acute comminuted displaced and angulated distal radius and ulna fractures. Electronically Signed   By: Donavan Foil M.D.   On: 03/27/2021 00:25   CT Head Wo Contrast  Result Date: 03/27/2021 CLINICAL DATA:  Fall EXAM: CT HEAD WITHOUT CONTRAST CT CERVICAL SPINE WITHOUT CONTRAST TECHNIQUE: Multidetector CT imaging of the head and cervical spine was performed following the standard protocol without intravenous contrast. Multiplanar CT image reconstructions of the cervical spine were also generated. COMPARISON:  None. FINDINGS: CT HEAD FINDINGS Brain: There is no mass, hemorrhage or extra-axial collection. There is generalized atrophy without lobar predilection. There is hypoattenuation of the periventricular white matter, most commonly indicating chronic ischemic microangiopathy. Vascular: No abnormal hyperdensity of the major intracranial arteries or dural venous sinuses. No intracranial atherosclerosis. Skull: The visualized skull base, calvarium and extracranial soft tissues are normal. Sinuses/Orbits: No fluid levels or advanced mucosal thickening of the visualized paranasal sinuses. No mastoid or middle ear effusion. The orbits are normal. CT CERVICAL  SPINE FINDINGS Alignment: No static subluxation. Facets are aligned. Occipital condyles are normally positioned. Skull base and vertebrae: No acute fracture. Soft tissues and spinal canal: No prevertebral fluid or swelling. No visible canal hematoma. Disc levels: No advanced spinal canal or neural foraminal stenosis. Upper chest:  No pneumothorax, pulmonary nodule or pleural effusion. Other: Normal visualized paraspinal cervical soft tissues. IMPRESSION: 1. Chronic ischemic microangiopathy and generalized atrophy without acute intracranial abnormality. 2. No acute fracture or static subluxation of the cervical spine. Electronically Signed   By: Ulyses Jarred M.D.   On: 03/27/2021 00:53   CT Cervical Spine Wo Contrast  Result Date: 03/27/2021 CLINICAL DATA:  Fall EXAM: CT HEAD WITHOUT CONTRAST CT CERVICAL SPINE WITHOUT CONTRAST TECHNIQUE: Multidetector CT imaging of the head and cervical spine was performed following the standard protocol without intravenous contrast. Multiplanar CT image reconstructions of the cervical spine were also generated. COMPARISON:  None. FINDINGS: CT HEAD FINDINGS Brain: There is no mass, hemorrhage or extra-axial collection. There is generalized atrophy without lobar predilection. There is hypoattenuation of the periventricular white matter, most commonly indicating chronic ischemic microangiopathy. Vascular: No abnormal hyperdensity of the major intracranial arteries or dural venous sinuses. No intracranial atherosclerosis. Skull: The visualized skull base, calvarium and extracranial soft tissues are normal. Sinuses/Orbits: No fluid levels or advanced mucosal thickening of the visualized paranasal sinuses. No mastoid or middle ear effusion. The orbits are normal. CT CERVICAL SPINE FINDINGS Alignment: No static subluxation. Facets are aligned. Occipital condyles are normally positioned. Skull base and vertebrae: No acute fracture. Soft tissues and spinal canal: No prevertebral fluid or  swelling. No visible canal hematoma. Disc levels: No advanced spinal canal or neural foraminal stenosis. Upper chest: No pneumothorax, pulmonary nodule or pleural effusion. Other: Normal visualized paraspinal cervical soft tissues. IMPRESSION: 1. Chronic ischemic microangiopathy and generalized atrophy without acute intracranial abnormality. 2. No acute fracture or static subluxation of the cervical spine. Electronically Signed   By: Ulyses Jarred M.D.   On: 03/27/2021 00:53   DG Pelvis Portable  Result Date: 03/27/2021 CLINICAL DATA:  Fall EXAM: PORTABLE PELVIS 1-2 VIEWS COMPARISON:  04/17/2020 FINDINGS: Partially visualized intramedullary rod in the right femur. Treated compression deformities of the lumbar spine. SI joints are non widened. The pubic symphysis and rami appear intact. Acute impacted appearing basicervical fracture on the left. IMPRESSION: Acute impacted appearing left basicervical femoral fracture. Electronically Signed   By: Donavan Foil M.D.   On: 03/27/2021 00:23   DG Chest Portable 1 View  Result Date: 03/27/2021 CLINICAL DATA:  Level 2 trauma, fall EXAM: PORTABLE CHEST 1 VIEW COMPARISON:  04/17/2020 12/12/2015 FINDINGS: Linear scarring or atelectasis at the left base. Mild chronic bronchitic changes. No acute airspace disease. Stable cardiac size. Probable skin fold artifact at left CP angle. Acute displaced proximal left humerus fracture. IMPRESSION: 1. Linear scarring or atelectasis at the left base. 2. Acute displaced proximal left humerus fracture. Electronically Signed   By: Donavan Foil M.D.   On: 03/27/2021 00:19   DG Shoulder Left Port  Result Date: 03/27/2021 CLINICAL DATA:  Fall EXAM: LEFT SHOULDER COMPARISON:  None. FINDINGS: AC joint is intact. Left lung apex is clear. Acute mildly comminuted fracture involving the left humeral neck with moderate posterior angulation of distal fracture fragment and close to 1 shaft diameter anterior displacement of distal fracture  fragment. The humeral head appears to project over glenoid IMPRESSION: Acute mildly comminuted, displaced and angulated left humeral neck fracture. Electronically Signed   By: Donavan Foil M.D.   On: 03/27/2021 00:20    Richarda Osmond, DO 03/27/2021, 2:41 AM PGY-3, [place] [facility] pager: [phone], text pages welcome

## 2021-03-27 NOTE — Interval H&P Note (Signed)
History and Physical Interval Note:  03/27/2021 11:34 AM  Savannah Coleman  has presented today for surgery, with the diagnosis of LEFT HIP FRACTURE.  The various methods of treatment have been discussed with the patient and family. After consideration of risks, benefits and other options for treatment, the patient has consented to  Procedure(s): INTRAMEDULLARY (IM) NAIL INTERTROCHANTRIC (Left) OPEN REDUCTION INTERNAL FIXATION (ORIF) WRIST FRACTURE (Left) as a surgical intervention.  The patient's history has been reviewed, patient examined, no change in status, stable for surgery.  I have reviewed the patient's chart and labs.  Questions were answered to the patient's satisfaction.     Lennette Bihari P Nell Gales

## 2021-03-27 NOTE — Anesthesia Postprocedure Evaluation (Signed)
Anesthesia Post Note  Patient: Savannah Coleman  Procedure(s) Performed: INTRAMEDULLARY (IM) NAIL INTERTROCHANTRIC (Left Hip) OPEN REDUCTION INTERNAL FIXATION (ORIF) WRIST FRACTURE (Left Wrist)     Patient location during evaluation: PACU Anesthesia Type: Regional, Spinal and MAC Level of consciousness: awake and alert Pain management: pain level controlled Vital Signs Assessment: post-procedure vital signs reviewed and stable Respiratory status: spontaneous breathing, nonlabored ventilation, respiratory function stable and patient connected to nasal cannula oxygen Cardiovascular status: stable and blood pressure returned to baseline Postop Assessment: no apparent nausea or vomiting and spinal receding Anesthetic complications: no   No complications documented.  Last Vitals:  Vitals:   03/27/21 1630 03/27/21 1729  BP: 139/86 138/88  Pulse: 78 84  Resp: 17 18  Temp: 36.6 C 36.6 C  SpO2: 96% 96%    Last Pain:  Vitals:   03/27/21 1729  TempSrc: Oral  PainSc:                  Genie Mirabal

## 2021-03-27 NOTE — Plan of Care (Signed)

## 2021-03-27 NOTE — H&P (View-Only) (Signed)
Orthopaedic Trauma Service (OTS) Consult   Patient ID: Savannah Coleman MRN: [id number] DOB/AGE: 85-Feb-1932 85 y.o.  Reason for Consult:Multiple Fracture Referring Physician: Dr. Victorino December, MD [facility]  HPI: Savannah Coleman is an 87 y.o. female who is being seen in consultation at the request of Dr. Stann Mainland for evaluation of left hip fracture, left distal radius fracture and left proximal humerus fracture.  Patient resides at [facility].  She saw a spider in her bathroom was trying to get it and she unfortunately fell and landed on her left side.  She had immediate pain and deformity inability bear weight.  She was brought to the emergency room and x-ray showed a left hip fracture left distal radius fracture and a left proximal humerus fracture.  Due to the OR availability Dr. Stann Mainland asked that I assist with care of the patient.  Patient was seen and evaluated in the preoperative holding area.  She is currently comfortable but having a lot of pain in her wrist.  She had a right hip fracture that was performed by my partner, Dr. Marcelino Scot, in May of last year.  She was ambulating with the use of a assist device.  Patient does take narcotic medications at baseline.  No other issues of note no pain in her right arm or right leg.  Past Medical History:  Diagnosis Date  . Anemia   . Anxiety   . Constipation   . COPD (chronic obstructive pulmonary disease) ([facility])   . Heartburn   . High cholesterol   . History of vertebral fracture   . Hypertension   . Insomnia   . Recurrent falls 03/27/2021  . Resides in long term care facility 03/27/2021    Past Surgical History:  Procedure Laterality Date  . HIP ARTHROPLASTY Right   . LUMBAR FUSION    . VERTEBROPLASTY     Lumbar vertebra  . WRIST SURGERY Bilateral     History reviewed. No pertinent family history.  Social History:  reports that she has never smoked. She has never used smokeless tobacco. She reports previous alcohol use. She  reports previous drug use.  Allergies:  Allergies  Allergen Reactions  . Morphine And Related Nausea Only    Medications:  No current facility-administered medications on file prior to encounter.   Current Outpatient Medications on File Prior to Encounter  Medication Sig Dispense Refill  . acetaminophen (TYLENOL) 325 MG tablet Take 325 mg by mouth every 4 (four) hours as needed for moderate pain.    Marland Kitchen albuterol (PROVENTIL) (2.5 MG/3ML) 0.083% nebulizer solution Take 2.5 mg by nebulization every 4 (four) hours as needed for wheezing or shortness of breath.    Marland Kitchen alum & mag hydroxide-simeth (MAALOX PLUS) 400-400-40 MG/5ML suspension Take 30 mLs by mouth as directed. Give 30 ml once a week on Mondays & Give 30 mls by mouth every 6 hours PRN for Indigestion, Heartburn, Gas, Nausea    . amLODipine (NORVASC) 5 MG tablet Take 1 tablet by mouth daily.    . bisacodyl (DULCOLAX) 5 MG EC tablet Take 5 mg by mouth daily as needed for moderate constipation.    . calcitonin, salmon, (MIACALCIN/FORTICAL) 200 UNIT/ACT nasal spray Place 1 spray into alternate nostrils daily.    . calcium carbonate (TUMS - DOSED IN MG ELEMENTAL CALCIUM) 500 MG chewable tablet Chew 1 tablet by mouth every 8 (eight) hours as needed for indigestion.    . calcium-vitamin D (OSCAL WITH D) 500-200 MG-UNIT tablet Take 1 tablet  by mouth.    . docusate sodium (COLACE) 100 MG capsule Take 100 mg by mouth 2 (two) times daily.    . ferrous sulfate 325 (65 FE) MG EC tablet Take 325 mg by mouth daily with breakfast.    . HYDROcodone-acetaminophen (NORCO) 10-325 MG tablet Take 1 tablet by mouth at bedtime.    Marland Kitchen HYDROcodone-acetaminophen (NORCO/VICODIN) 5-325 MG tablet Take 1 tablet by mouth in the morning.    . Lido-PE-Glycerin-Petrolatum (PREPARATION H RAPID RELIEF) 5-0.25-14.4-15 % CREA Apply 1 application topically every 8 (eight) hours as needed (hemorrhoids).    . melatonin 3 MG TABS tablet Take 3 mg by mouth at bedtime as needed  (sleep).    . meloxicam (MOBIC) 7.5 MG tablet Take 7.5 mg by mouth daily.    . metoprolol succinate (TOPROL-XL) 25 MG 24 hr tablet Take 12.5 mg by mouth daily.    . Multiple Vitamin (MULTIVITAMIN ADULT) TABS Take 1 tablet by mouth daily.    . nitroGLYCERIN (NITROSTAT) 0.4 MG SL tablet Place 0.4 mg under the tongue every 5 (five) minutes as needed for chest pain.    Marland Kitchen ondansetron (ZOFRAN) 4 MG tablet Take 4 mg by mouth daily as needed for nausea or vomiting.    . pantoprazole (PROTONIX) 40 MG tablet Take 1 tablet by mouth daily.    . polyethylene glycol (MIRALAX / GLYCOLAX) 17 g packet Take 17 g by mouth daily.    . simvastatin (ZOCOR) 20 MG tablet Take 20 mg by mouth at bedtime.    Marland Kitchen tiZANidine (ZANAFLEX) 2 MG tablet Take 2 mg by mouth every 8 (eight) hours as needed for muscle spasms.    . vitamin C (ASCORBIC ACID) 500 MG tablet Take 500 mg by mouth daily.      ROS: Constitutional: No fever or chills Vision: No changes in vision ENT: No difficulty swallowing CV: No chest pain Pulm: No SOB or wheezing GI: No nausea or vomiting GU: No urgency or inability to hold urine Skin: No poor wound healing Neurologic: No numbness or tingling Psychiatric: No depression or anxiety Heme: No bruising Allergic: No reaction to medications or food   Exam: Blood pressure (!) 166/88, pulse 97, temperature 98.4 F (36.9 C), temperature source Oral, resp. rate 18, height 5\' 2"  (1.575 m), weight 52.6 kg, SpO2 98 %. General: No acute distress Orientation: Awake alert and oriented x3 Mood and Affect: Cooperative and pleasant Gait: Unable to assess due to her fracture Coordination and balance: Within normal limits  Left lower extremity: Leg is shortened and externally rotated.  Obvious deformity at the thigh with notable swelling.  No skin lesions.  No tenderness about the distal femur.  No knee effusion.  Compartments are soft compressible.  Active dorsiflexion plantarflexion of the toes and foot.  She  has sensation intact to light touch to the dorsum and plantar aspect of her foot and warm well-perfused foot as well.  No lymphadenopathy.  Reflexes within normal limits.  Left upper extremity: Shoulder has significant swelling and ecchymosis around it.  Pain with any attempted range of motion.  Compartments are soft compressible.  Sugar tong splint is in place.  Is clean dry and intact.  She is able to wiggle her fingers but is limited secondary to pain.  She endorses sensation to the median, radial and ulnar nerve distribution.  She has brisk cap refill of less than 2 seconds.  Right upper extremity and right lower extremity: Skin without lesions. No tenderness to palpation. Full painless  ROM, full strength in each muscle groups without evidence of instability.   Medical Decision Making: Data: Imaging: X-rays of the left hip show a basicervical femoral neck fracture with significant displacement and varus angulation.  X-rays of the left shoulder show a 2 part proximal humerus fracture through the surgical neck.  Significant varus angulation.  X-rays of the left wrist show comminuted and displaced distal radius and distal ulna fracture with significant displacement and dorsal angulation and translation of the carpus.  Labs:  Results for orders placed or performed during the hospital encounter of 03/26/21 (from the past 24 hour(s))  Type and screen [facility]     Status: None   Collection Time: 03/26/21 11:57 PM  Result Value Ref Range   ABO/RH(D) O POS    Antibody Screen NEG    Sample Expiration      03/29/2021,2359 Performed at [facility], [place] [address] [place], [place] [address]   Comprehensive metabolic panel     Status: Abnormal   Collection Time: 03/27/21 12:03 AM  Result Value Ref Range   Sodium 137 135 - 145 mmol/L   Potassium 2.8 (L) 3.5 - 5.1 mmol/L   Chloride 102 98 - 111 mmol/L   CO2 28 22 - 32 mmol/L   Glucose, Bld 150 (H) 70 - 99 mg/dL   BUN  9 8 - 23 mg/dL   Creatinine, Ser 0.70 0.44 - 1.00 mg/dL   Calcium 9.4 8.9 - 10.3 mg/dL   Total Protein 6.5 6.5 - 8.1 g/dL   Albumin 3.9 3.5 - 5.0 g/dL   AST 28 15 - 41 U/L   ALT 22 0 - 44 U/L   Alkaline Phosphatase 66 38 - 126 U/L   Total Bilirubin 0.6 0.3 - 1.2 mg/dL   GFR, Estimated >60 >60 mL/min   Anion gap 7 5 - 15  Ethanol     Status: None   Collection Time: 03/27/21 12:03 AM  Result Value Ref Range   Alcohol, Ethyl (B) <10 <10 mg/dL  Protime-INR     Status: None   Collection Time: 03/27/21 12:03 AM  Result Value Ref Range   Prothrombin Time 13.3 11.4 - 15.2 seconds   INR 1.1 0.8 - 1.2  CBC with Differential     Status: Abnormal   Collection Time: 03/27/21 12:03 AM  Result Value Ref Range   WBC 9.3 4.0 - 10.5 K/uL   RBC 4.10 3.87 - 5.11 MIL/uL   Hemoglobin 13.6 12.0 - 15.0 g/dL   HCT 41.2 36.0 - 46.0 %   MCV 100.5 (H) 80.0 - 100.0 fL   MCH 33.2 26.0 - 34.0 pg   MCHC 33.0 30.0 - 36.0 g/dL   RDW 12.5 11.5 - 15.5 %   Platelets 241 150 - 400 K/uL   nRBC 0.0 0.0 - 0.2 %   Neutrophils Relative % 66 %   Neutro Abs 6.1 1.7 - 7.7 K/uL   Lymphocytes Relative 24 %   Lymphs Abs 2.2 0.7 - 4.0 K/uL   Monocytes Relative 7 %   Monocytes Absolute 0.7 0.1 - 1.0 K/uL   Eosinophils Relative 2 %   Eosinophils Absolute 0.2 0.0 - 0.5 K/uL   Basophils Relative 0 %   Basophils Absolute 0.0 0.0 - 0.1 K/uL   Immature Granulocytes 1 %   Abs Immature Granulocytes 0.09 (H) 0.00 - 0.07 K/uL  Magnesium     Status: None   Collection Time: 03/27/21 12:03 AM  Result Value  Ref Range   Magnesium 1.8 1.7 - 2.4 mg/dL  ABO/Rh     Status: None   Collection Time: 03/27/21 12:09 AM  Result Value Ref Range   ABO/RH(D)      O POS Performed at [facility] [address], [place], [place] [address]   SARS CORONAVIRUS 2 (TAT 6-24 HRS) Nasopharyngeal Nasopharyngeal Swab     Status: None   Collection Time: 03/27/21 12:55 AM   Specimen: Nasopharyngeal Swab  Result Value Ref Range   SARS  Coronavirus 2 NEGATIVE NEGATIVE  MRSA PCR Screening     Status: None   Collection Time: 03/27/21  7:41 AM   Specimen: Nasopharyngeal  Result Value Ref Range   MRSA by PCR NEGATIVE NEGATIVE  Vitamin B12     Status: None   Collection Time: 03/27/21  9:03 AM  Result Value Ref Range   Vitamin B-12 421 180 - 914 pg/mL  Folate     Status: None   Collection Time: 03/27/21  9:03 AM  Result Value Ref Range   Folate 19.8 >5.9 ng/mL    Imaging or Labs ordered: None  Medical history and chart was reviewed and case discussed with medical provider.  Assessment/Plan: 87 year old female status post ground-level fall with a multiple orthopedic injuries including:  1.  Left basicervical femoral neck/intertrochanteric femur fracture 2.  Left surgical neck proximal humerus fracture 3.  Left significantly displaced distal radius/distal ulna fracture  Due to the unstable nature of her injury I recommend proceeding with cephalomedullary nailing of the left hip.  I also recommend open reduction internal fixation of the left distal radius.  Due to the amount of displacement of her proximal humerus and the varus angulation and her poor bone quality I will recommend likely reverse total shoulder arthroplasty.  I did discuss with the patient and her daughter that we could fix this however it would be high risk for failure and would be high risk for stiffness as well as limited weightbearing.  I discussed risk and benefits of the hip and the wrist.  Risks included but not limited to bleeding, infection, malunion, nonunion, hardware failure, hardware irritation, nerve or blood vessel injury, DVT, even the possibility anesthetic complications.  The patient agreed to proceed with surgery and consent was obtained.  The patient will likely undergo reverse total shoulder arthroplasty with Dr. Stann Mainland later in the week.  70 minutes was spent in face-to-face evaluation and care including discussion of risks and benefits,  examining the patient, reviewing the imaging, discussion with Dr. Stann Mainland for plan of care, discussion with [facility] regarding plan of care, and with Dr. Ermalene Postin for anesthesia plan.  Shona Needles, MD [facility] [phone] (office) [email or web address]

## 2021-03-27 NOTE — ED Notes (Signed)
Ortho tech at bedside waiting for pt to return from CT.

## 2021-03-27 NOTE — Progress Notes (Signed)
Consulted yesterday evening regarding low energy multitrauma to this geriatric patient.  I have requested that Dr. Lennette Bihari Haddix take a look at her left hip and left distal radius today.  He is on call and able to help with his area of orthopedic trauma expertise.  I will reengage on Monday or Tuesday to discuss the left proximal humerus fracture.  This will likely need to be treated with a reverse arthroplasty for early motion and early weightbearing to allow her to mobilize after having the hip and wrist fixed.  At this time I have placed her on my operative schedule for Wednesday afternoon.  Again, we will reengage the patient with formal discussion about surgery during the early week.

## 2021-03-27 NOTE — Transfer of Care (Signed)
Immediate Anesthesia Transfer of Care Note  Patient: Savannah Coleman  Procedure(s) Performed: INTRAMEDULLARY (IM) NAIL INTERTROCHANTRIC (Left Hip) OPEN REDUCTION INTERNAL FIXATION (ORIF) WRIST FRACTURE (Left Wrist)  Patient Location: PACU  Anesthesia Type:MAC and Regional  Level of Consciousness: awake, alert  and oriented  Airway & Oxygen Therapy: Patient Spontanous Breathing and Patient connected to nasal cannula oxygen  Post-op Assessment: Report given to RN and Post -op Vital signs reviewed and stable  Post vital signs: Reviewed and stable  Last Vitals:  Vitals Value Taken Time  BP    Temp    Pulse 85 03/27/21 1443  Resp 11 03/27/21 1443  SpO2 90 % 03/27/21 1443  Vitals shown include unvalidated device data.  Last Pain:  Vitals:   03/27/21 1135  TempSrc:   PainSc: 6       Patients Stated Pain Goal: 4 (29/93/71 6967)  Complications: No complications documented.

## 2021-03-27 NOTE — Progress Notes (Signed)
Orthopedic Tech Progress Note Patient Details:  Savannah Coleman 1931-12-07 [id number]  Ortho Devices Type of Ortho Device: Sugartong splint Ortho Device/Splint Location: lle Ortho Device/Splint Interventions: Ordered,Application,Adjustment   Post Interventions Patient Tolerated: Well Instructions Provided: Care of device,Adjustment of device   Karolee Stamps 03/27/2021, 5:17 AM

## 2021-03-27 NOTE — H&P (Signed)
Reason for Consult:L wrist fx Referring Physician: ER  CC:I fell   HPI:  Savannah Coleman is an 87 y.o. right handed female who presents with multiple L sided fractures ( hip, upper arm, distal radius/ulna) after a fall last pm.    Pt was bending over trying to kill a spider when she fell.  Presented to ER. I was consulted for L wrist.  Currently in L wrist splint.  General Ortho to address upper arm/hip.   Pain is rated at   7 /10 and is described as sharp.  Pain is constant.  Pain is made better by rest/immobilization, worse with motion.   Associated signs/symptoms: some nausea Previous treatment:    Past Medical History:  Diagnosis Date  . Anemia   . Anxiety   . Constipation   . COPD (chronic obstructive pulmonary disease) ([place])   . Heartburn   . High cholesterol   . Hypertension   . Insomnia     Past Surgical History:  Procedure Laterality Date  . HIP ARTHROPLASTY Right   . LUMBAR FUSION    . WRIST SURGERY Bilateral     History reviewed. No pertinent family history.  Social History:  reports that she has never smoked. She has never used smokeless tobacco. She reports previous alcohol use. She reports previous drug use.  Allergies:  Allergies  Allergen Reactions  . Morphine And Related     Medications: I have reviewed the patient's current medications.  Results for orders placed or performed during the hospital encounter of 03/26/21 (from the past 48 hour(s))  Type and screen J[place]     Status: None   Collection Time: 03/26/21 11:57 PM  Result Value Ref Range   ABO/RH(D) O POS    Antibody Screen NEG    Sample Expiration      03/29/2021,2359 Performed at [facility], [place] [address] [place], [place] [address]   Comprehensive metabolic panel     Status: Abnormal   Collection Time: 03/27/21 12:03 AM  Result Value Ref Range   Sodium 137 135 - 145 mmol/L   Potassium 2.8 (L) 3.5 - 5.1 mmol/L   Chloride 102 98 - 111 mmol/L   CO2 28 22 -  32 mmol/L   Glucose, Bld 150 (H) 70 - 99 mg/dL    Comment: Glucose reference range applies only to samples taken after fasting for at least 8 hours.   BUN 9 8 - 23 mg/dL   Creatinine, Ser 0.70 0.44 - 1.00 mg/dL   Calcium 9.4 8.9 - 10.3 mg/dL   Total Protein 6.5 6.5 - 8.1 g/dL   Albumin 3.9 3.5 - 5.0 g/dL   AST 28 15 - 41 U/L   ALT 22 0 - 44 U/L   Alkaline Phosphatase 66 38 - 126 U/L   Total Bilirubin 0.6 0.3 - 1.2 mg/dL   GFR, Estimated >60 >60 mL/min    Comment: (NOTE) Calculated using the CKD-EPI Creatinine Equation (2021)    Anion gap 7 5 - 15    Comment: Performed at [facility] [address] [place], [place] [address]  Ethanol     Status: None   Collection Time: 03/27/21 12:03 AM  Result Value Ref Range   Alcohol, Ethyl (B) <10 <10 mg/dL    Comment: (NOTE) Lowest detectable limit for serum alcohol is 10 mg/dL.  For medical purposes only. Performed at [facility], [place] [address] [place], [place] [address]   Protime-INR  Status: None   Collection Time: 03/27/21 12:03 AM  Result Value Ref Range   Prothrombin Time 13.3 11.4 - 15.2 seconds   INR 1.1 0.8 - 1.2    Comment: (NOTE) INR goal varies based on device and disease states. Performed at [facility], [place] [address], [place], [place] [address]   CBC with Differential     Status: Abnormal   Collection Time: 03/27/21 12:03 AM  Result Value Ref Range   WBC 9.3 4.0 - 10.5 K/uL   RBC 4.10 3.87 - 5.11 MIL/uL   Hemoglobin 13.6 12.0 - 15.0 g/dL   HCT 41.2 36.0 - 46.0 %   MCV 100.5 (H) 80.0 - 100.0 fL   MCH 33.2 26.0 - 34.0 pg   MCHC 33.0 30.0 - 36.0 g/dL   RDW 12.5 11.5 - 15.5 %   Platelets 241 150 - 400 K/uL   nRBC 0.0 0.0 - 0.2 %   Neutrophils Relative % 66 %   Neutro Abs 6.1 1.7 - 7.7 K/uL   Lymphocytes Relative 24 %   Lymphs Abs 2.2 0.7 - 4.0 K/uL   Monocytes Relative 7 %   Monocytes Absolute 0.7 0.1 - 1.0 K/uL   Eosinophils Relative 2 %   Eosinophils Absolute 0.2 0.0 - 0.5 K/uL    Basophils Relative 0 %   Basophils Absolute 0.0 0.0 - 0.1 K/uL   Immature Granulocytes 1 %   Abs Immature Granulocytes 0.09 (H) 0.00 - 0.07 K/uL    Comment: Performed at [facility] [address] [place], [place] [address]  Magnesium     Status: None   Collection Time: 03/27/21 12:03 AM  Result Value Ref Range   Magnesium 1.8 1.7 - 2.4 mg/dL    Comment: Performed at  [facility], [place] [address], [place], [place] [address]  ABO/Rh     Status: None   Collection Time: 03/27/21 12:09 AM  Result Value Ref Range   ABO/RH(D)      O POS Performed at [facility] [address], [place], [place] [address]   SARS CORONAVIRUS 2 (TAT 6-24 HRS) Nasopharyngeal Nasopharyngeal Swab     Status: None   Collection Time: 03/27/21 12:55 AM   Specimen: Nasopharyngeal Swab  Result Value Ref Range   SARS Coronavirus 2 NEGATIVE NEGATIVE    Comment: (NOTE) SARS-CoV-2 target nucleic acids are NOT DETECTED.  The SARS-CoV-2 RNA is generally detectable in upper and lower respiratory specimens during the acute phase of infection. Negative results do not preclude SARS-CoV-2 infection, do not rule out co-infections with other pathogens, and should not be used as the sole basis for treatment or other patient management decisions. Negative results must be combined with clinical observations, patient history, and epidemiological information. The expected result is Negative.  Fact Sheet for Patients: SugarRoll.be  Fact Sheet for Healthcare Providers: https://www.woods-mathews.com/  This test is not yet approved or cleared by the [place] FDA and  has been authorized for detection and/or diagnosis of SARS-CoV-2 by FDA under an Emergency Use Authorization (EUA). This EUA will remain  in effect (meaning this test can be used) for the duration of the COVID-19 declaration under Se ction 564(b)(1) of the Act, 21 U.S.C. section 360bbb-3(b)(1),  unless the authorization is terminated or revoked sooner.  Performed at [facility], [place] [address], [place], [place] [address]     DG Wrist 2 Views Left  Result Date: 03/27/2021 CLINICAL DATA:  Fall with wrist  deformity EXAM: LEFT WRIST - 2 VIEW COMPARISON:  04/30/2020 FINDINGS: Acute comminuted fracture involving the distal ulna with 1 shaft diameter dorsal displacement and moderate dorsal and radial angulation of distal fracture fragment. Fracture lucency also involves the ulnar styloid. Acute comminuted distal radius fracture with 1/2 shaft diameter radial and 1 shaft diameter dorsal displacement. There is moderate radial and dorsal angulation of distal fracture fragments. IMPRESSION: Acute comminuted displaced and angulated distal radius and ulna fractures. Electronically Signed   By: Donavan Foil M.D.   On: 03/27/2021 00:25   CT Head Wo Contrast  Result Date: 03/27/2021 CLINICAL DATA:  Fall EXAM: CT HEAD WITHOUT CONTRAST CT CERVICAL SPINE WITHOUT CONTRAST TECHNIQUE: Multidetector CT imaging of the head and cervical spine was performed following the standard protocol without intravenous contrast. Multiplanar CT image reconstructions of the cervical spine were also generated. COMPARISON:  None. FINDINGS: CT HEAD FINDINGS Brain: There is no mass, hemorrhage or extra-axial collection. There is generalized atrophy without lobar predilection. There is hypoattenuation of the periventricular white matter, most commonly indicating chronic ischemic microangiopathy. Vascular: No abnormal hyperdensity of the major intracranial arteries or dural venous sinuses. No intracranial atherosclerosis. Skull: The visualized skull base, calvarium and extracranial soft tissues are normal. Sinuses/Orbits: No fluid levels or advanced mucosal thickening of the visualized paranasal sinuses. No mastoid or middle ear effusion. The orbits are normal. CT CERVICAL SPINE FINDINGS Alignment: No static subluxation. Facets  are aligned. Occipital condyles are normally positioned. Skull base and vertebrae: No acute fracture. Soft tissues and spinal canal: No prevertebral fluid or swelling. No visible canal hematoma. Disc levels: No advanced spinal canal or neural foraminal stenosis. Upper chest: No pneumothorax, pulmonary nodule or pleural effusion. Other: Normal visualized paraspinal cervical soft tissues. IMPRESSION: 1. Chronic ischemic microangiopathy and generalized atrophy without acute intracranial abnormality. 2. No acute fracture or static subluxation of the cervical spine. Electronically Signed   By: Ulyses Jarred M.D.   On: 03/27/2021 00:53   CT Cervical Spine Wo Contrast  Result Date: 03/27/2021 CLINICAL DATA:  Fall EXAM: CT HEAD WITHOUT CONTRAST CT CERVICAL SPINE WITHOUT CONTRAST TECHNIQUE: Multidetector CT imaging of the head and cervical spine was performed following the standard protocol without intravenous contrast. Multiplanar CT image reconstructions of the cervical spine were also generated. COMPARISON:  None. FINDINGS: CT HEAD FINDINGS Brain: There is no mass, hemorrhage or extra-axial collection. There is generalized atrophy without lobar predilection. There is hypoattenuation of the periventricular white matter, most commonly indicating chronic ischemic microangiopathy. Vascular: No abnormal hyperdensity of the major intracranial arteries or dural venous sinuses. No intracranial atherosclerosis. Skull: The visualized skull base, calvarium and extracranial soft tissues are normal. Sinuses/Orbits: No fluid levels or advanced mucosal thickening of the visualized paranasal sinuses. No mastoid or middle ear effusion. The orbits are normal. CT CERVICAL SPINE FINDINGS Alignment: No static subluxation. Facets are aligned. Occipital condyles are normally positioned. Skull base and vertebrae: No acute fracture. Soft tissues and spinal canal: No prevertebral fluid or swelling. No visible canal hematoma. Disc levels: No  advanced spinal canal or neural foraminal stenosis. Upper chest: No pneumothorax, pulmonary nodule or pleural effusion. Other: Normal visualized paraspinal cervical soft tissues. IMPRESSION: 1. Chronic ischemic microangiopathy and generalized atrophy without acute intracranial abnormality. 2. No acute fracture or static subluxation of the cervical spine. Electronically Signed   By: Ulyses Jarred M.D.   On: 03/27/2021 00:53   DG Pelvis Portable  Result Date: 03/27/2021 CLINICAL DATA:  Fall EXAM: PORTABLE PELVIS 1-2 VIEWS COMPARISON:  04/17/2020 FINDINGS: Partially visualized intramedullary rod in the right femur. Treated compression deformities of the lumbar spine. SI joints are non widened. The pubic symphysis and rami appear intact. Acute impacted appearing basicervical fracture on the left. IMPRESSION: Acute impacted appearing left basicervical femoral fracture. Electronically Signed   By: Donavan Foil M.D.   On: 03/27/2021 00:23   CT HIP LEFT WO CONTRAST  Result Date: 03/27/2021 CLINICAL DATA:  Left hip pain. EXAM: CT OF THE LEFT HIP WITHOUT CONTRAST TECHNIQUE: Multidetector CT imaging of the left hip was performed according to the standard protocol. Multiplanar CT image reconstructions were also generated. COMPARISON:  None. FINDINGS: Bones/Joint/Cartilage An acute, comminuted fracture deformity is seen extending through the inter trochanteric region of the proximal left femur. There is no evidence of associated dislocation. Multilevel degenerative changes are seen throughout the visualized portion of the lumbar spine with evidence of prior vertebroplasty noted at the levels of L2, L3 and L4. Ligaments Suboptimally assessed by CT. Muscles and Tendons Muscles and tendons are intact. Soft tissues Mild subcutaneous inflammatory fat stranding is seen along the lateral aspect of the left hip. Other Of incidental note is the presence of numerous noninflamed diverticula seen throughout the sigmoid colon.  IMPRESSION: Acute, comminuted fracture of the proximal left femur. Electronically Signed   By: Virgina Norfolk M.D.   On: 03/27/2021 03:14   DG Chest Portable 1 View  Result Date: 03/27/2021 CLINICAL DATA:  Level 2 trauma, fall EXAM: PORTABLE CHEST 1 VIEW COMPARISON:  04/17/2020 12/12/2015 FINDINGS: Linear scarring or atelectasis at the left base. Mild chronic bronchitic changes. No acute airspace disease. Stable cardiac size. Probable skin fold artifact at left CP angle. Acute displaced proximal left humerus fracture. IMPRESSION: 1. Linear scarring or atelectasis at the left base. 2. Acute displaced proximal left humerus fracture. Electronically Signed   By: Donavan Foil M.D.   On: 03/27/2021 00:19   DG Shoulder Left Port  Result Date: 03/27/2021 CLINICAL DATA:  Fall EXAM: LEFT SHOULDER COMPARISON:  None. FINDINGS: AC joint is intact. Left lung apex is clear. Acute mildly comminuted fracture involving the left humeral neck with moderate posterior angulation of distal fracture fragment and close to 1 shaft diameter anterior displacement of distal fracture fragment. The humeral head appears to project over glenoid IMPRESSION: Acute mildly comminuted, displaced and angulated left humeral neck fracture. Electronically Signed   By: Donavan Foil M.D.   On: 03/27/2021 00:20    Pertinent items are noted in HPI. Temp:  [97.2 F (36.2 C)-98.6 F (37 C)] 98.6 F (37 C) (04/10 0747) Pulse Rate:  [81-97] 89 (04/10 0747) Resp:  [12-16] 15 (04/10 0747) BP: (142-189)/(85-129) 146/86 (04/10 0747) SpO2:  [95 %-100 %] 96 % (04/10 0747) Weight:  [52.6 kg] 52.6 kg (04/10 0044) General appearance: alert and cooperative Resp: clear to auscultation bilaterally Cardio: regular rate and rhythm GI: soft, non-tender; bowel sounds normal; no masses,  no organomegaly Extremities: L wrist in splint, min swelling to fingers, deformity obvious, gross sensation intact, able to move fingers, fingers well  perfused.   Assessment: Fall with multiple fractures L hip, L humerus, L radius/ulna Plan: OR today for fixation.  Discussed with Dr Doreatha Martin, he will fix hip today and has accepted care to fix wrist as well.  Thank you.   Hunner Garcon C Kayleen Alig 03/27/2021, 8:11 AM

## 2021-03-27 NOTE — Progress Notes (Signed)
OT Cancellation Note  Patient Details Name: Savannah Coleman MRN: [id number] DOB: April 02, 1931   Cancelled Treatment:    Reason Eval/Treat Not Completed: Other (comment). Will follow up after surgery.  Ramond Dial, OT/L   Acute OT Clinical Specialist Acute Rehabilitation Services Pager [phone] Office [phone]  03/27/2021, 8:14 AM

## 2021-03-27 NOTE — Hospital Course (Addendum)
Savannah Coleman is a 87 y.o. female presenting with fall resulting in multiple fractures. PMH is significant for COPD, HLD, HTN, R hip arthroplasty, lumbar fusion, bilateral wrist surgery.   Fall with multiple fx [name] presented to the ED via EMS after sustaining several injuries after a fall in the bathroom of her SNF. Patient reports a mechanical fall, tried to step on a spider and tripped. No LOC, no head trauma. Vital signs stable. CT head demonstrated no acute intracranial abnormality. CT C-spine negative for acute fracture or static subluxation. CT left hip demonstrated acute comminuted fracture of proximal left femur. XR left shoulder showed acute mildly comminuted displaced angulated fracture of left humeral neck. XR left wrist demonstrated acute comminuted displaced angulated distal radius and ulnar fractures.   Hypokalemia Potassium 2.8 on admission, improved after PO supplementation. Patient required repeated *** potassium supplementation during hospitalization. Potassium on day of discharge ***. Recommend recheck with PCP follow up in one week.   Other chronic conditions stable throughout admission: HLD HTN GERD  DC recommendations:  Recheck BMP, specifically potassium as patient required aggressive K repletion.  Recommend patient be started on bisphosphonates as outpatient.  Recommend discussing if patient needs to continue Metoprolol as outpatient *** Discharged on ***O2 L, wean as tolerated Ensure patient follows up with orthopedics outpatient in 2 weeks for repeat imaging and wound check of left hip and left wrist with Dr. Stann Mainland.

## 2021-03-27 NOTE — ED Notes (Signed)
Pt is in CT at this time.

## 2021-03-27 NOTE — Plan of Care (Signed)
  Problem: Education: Goal: Knowledge of General Education information will improve Description Including pain rating scale, medication(s)/side effects and non-pharmacologic comfort measures Outcome: Progressing   Problem: Health Behavior/Discharge Planning: Goal: Ability to manage health-related needs will improve Outcome: Progressing   

## 2021-03-28 DIAGNOSIS — S42292D Other displaced fracture of upper end of left humerus, subsequent encounter for fracture with routine healing: Secondary | ICD-10-CM

## 2021-03-28 DIAGNOSIS — S72002D Fracture of unspecified part of neck of left femur, subsequent encounter for closed fracture with routine healing: Secondary | ICD-10-CM | POA: Diagnosis not present

## 2021-03-28 DIAGNOSIS — T07XXXA Unspecified multiple injuries, initial encounter: Secondary | ICD-10-CM | POA: Diagnosis not present

## 2021-03-28 DIAGNOSIS — S52502A Unspecified fracture of the lower end of left radius, initial encounter for closed fracture: Secondary | ICD-10-CM | POA: Diagnosis not present

## 2021-03-28 DIAGNOSIS — W19XXXD Unspecified fall, subsequent encounter: Secondary | ICD-10-CM

## 2021-03-28 LAB — BASIC METABOLIC PANEL
Anion gap: 3 — ABNORMAL LOW (ref 5–15)
BUN: 14 mg/dL (ref 8–23)
CO2: 24 mmol/L (ref 22–32)
Calcium: 8.6 mg/dL — ABNORMAL LOW (ref 8.9–10.3)
Chloride: 109 mmol/L (ref 98–111)
Creatinine, Ser: 0.72 mg/dL (ref 0.44–1.00)
GFR, Estimated: >60 mL/min (ref >60)
Glucose, Bld: 134 mg/dL — ABNORMAL HIGH (ref 70–99)
Potassium: 4.1 mmol/L (ref 3.5–5.1)
Sodium: 136 mmol/L (ref 135–145)

## 2021-03-28 LAB — CBC
HCT: 26.7 % — ABNORMAL LOW (ref 36.0–46.0)
Hemoglobin: 8.9 g/dL — ABNORMAL LOW (ref 12.0–15.0)
MCH: 33.2 pg (ref 26.0–34.0)
MCHC: 33.3 g/dL (ref 30.0–36.0)
MCV: 99.6 fL (ref 80.0–100.0)
Platelets: 170 10*3/uL (ref 150–400)
RBC: 2.68 MIL/uL — ABNORMAL LOW (ref 3.87–5.11)
RDW: 12.9 % (ref 11.5–15.5)
WBC: 15.3 10*3/uL — ABNORMAL HIGH (ref 4.0–10.5)
nRBC: 0 % (ref 0.0–0.2)

## 2021-03-28 LAB — GLUCOSE, CAPILLARY: Glucose-Capillary: 110 mg/dL — ABNORMAL HIGH (ref 70–99)

## 2021-03-28 MED ORDER — OXYCODONE HCL 5 MG PO TABS: 2.5000 mg | ORAL_TABLET | Freq: Four times a day (QID) | ORAL | Status: DC | PRN

## 2021-03-28 MED ORDER — HYDROMORPHONE HCL 1 MG/ML IJ SOLN
0.2500 mg | Freq: Four times a day (QID) | INTRAMUSCULAR | Status: DC | PRN
  Administered 2021-03-31: 0.25 mg via INTRAVENOUS
  Filled 2021-03-28: qty 0.5

## 2021-03-28 MED ORDER — OXYCODONE HCL 5 MG PO TABS
2.5000 mg | ORAL_TABLET | Freq: Four times a day (QID) | ORAL | Status: AC
  Administered 2021-03-28 – 2021-03-29 (×4): 2.5 mg via ORAL
  Filled 2021-03-28 (×4): qty 1

## 2021-03-28 NOTE — Evaluation (Signed)
Physical Therapy Evaluation Patient Details Name: Savannah Coleman MRN: [id number] DOB: 1931/11/03 Today's Date: 03/28/2021   History of Present Illness  Savannah Coleman is an 87 y.o. female who was sustained a  left hip fracture, left distal radius fracture and left proximal humerus fracture after a mechanical fall in the bathroom trying to kill a spider. Pt underwent ORIF to L hip and L wrist however is planned for L reverse shld surgery on wednesday 4/13. PSH: bilat THA, bilat TSA, R IM nail to femur fx 04/2020 PMH: COPD, HTN, anemia   Clinical Impression   Pt admitted with above. Pt in good spirits despite pain. Pt was amb with rollator and indep with ADLs except bathing PTA. Pt now requiring maxA for transfers and ADLs. Pt did tolerate standing today. Aware pt is planned to return to the OR on Wednesday 4/13. Acute PT to cont to follow to progress mobility as able.    Follow Up Recommendations SNF;Supervision/Assistance - 24 hour (return to [facility] if they have SNF services)    Equipment Recommendations   (TBD at next venue)    Recommendations for Other Services       Precautions / Restrictions Precautions Precautions: Fall Precaution Comments: pt going back to OR for L reverse shld surgery on wednesday Required Braces or Orthoses: Sling (L UE) Restrictions Weight Bearing Restrictions: Yes LUE Weight Bearing: Non weight bearing LLE Weight Bearing: Weight bearing as tolerated      Mobility  Bed Mobility Overal bed mobility: Needs Assistance Bed Mobility: Supine to Sit;Sit to Supine     Supine to sit: Max assist;+2 for physical assistance;HOB elevated Sit to supine: +2 for physical assistance;Max assist;HOB elevated   General bed mobility comments: pt with increased pain in L LE with movement to EOB requiring maxA for LE management and trunk elevation, pt requiring maxA for LE management back into bed and then maxAX2 to pull up to [facility]    Transfers Overall transfer level:  Needs assistance Equipment used: 2 person hand held assist (gait belt) Transfers: Sit to/from Stand Sit to Stand: Mod assist;+2 physical assistance         General transfer comment: attempted to side step to EOB, completed 4 steps, maxA to advance L LE due to "my socks are gripping"  Ambulation/Gait             General Gait Details: deferred today due to fatigue and pain  Stairs            Wheelchair Mobility    Modified Rankin (Stroke Patients Only)       Balance Overall balance assessment: Needs assistance Sitting-balance support: Feet supported;Single extremity supported Sitting balance-Leahy Scale: Fair Sitting balance - Comments: poor sitting tolerance, s/p 5 min pt leaned back against OT for rest   Standing balance support: Single extremity supported;During functional activity Standing balance-Leahy Scale: Poor Standing balance comment: dependent on physical assist/support                             Pertinent Vitals/Pain Pain Assessment: Faces Faces Pain Scale: Hurts even more Pain Location: LUE and LLE Pain Descriptors / Indicators: Discomfort;Grimacing;Guarding Pain Intervention(s): Premedicated before session;Monitored during session    Home Living Family/patient expects to be discharged to:: Skilled nursing facility                 Additional Comments: [facility]    Prior Function Level of Independence: Needs assistance  Gait / Transfers Assistance Needed: Ambulated with rollator  ADL's / Homemaking Assistance Needed: Required assistance for bathing  Comments: Pt reported being independent with all ADLs wtih exception of bathing     Hand Dominance   Dominant Hand: Right    Extremity/Trunk Assessment   Upper Extremity Assessment Upper Extremity Assessment: Defer to OT evaluation (in sling, awaiting surgery on shld) RUE Deficits / Details: pt reports decreased fucntion of R hand at baseline, with complaints of  decreased strength and thumb ROM. LUE Deficits / Details: pt with ORIF of distal LUE, with plan to have surgery on L proximal UE. Pt with normal sensation in L hand, and able to move fingers. Slight increased swelling at this time. Pt is NWB through LUE.    Lower Extremity Assessment Lower Extremity Assessment: RLE deficits/detail;LLE deficits/detail RLE Deficits / Details: generalized weakness LLE Deficits / Details: limited active movement due to onset of pain, able to initiate LAQ sitting EOB and could complete quad set and ankle pump    Cervical / Trunk Assessment Cervical / Trunk Assessment: Kyphotic  Communication   Communication: No difficulties  Cognition Arousal/Alertness: Awake/alert Behavior During Therapy: WFL for tasks assessed/performed Overall Cognitive Status: Within Functional Limits for tasks assessed                                        General Comments General comments (skin integrity, edema, etc.): Pt with bruising on L UE and LE, VSS on 2Lo2 via [place]    Exercises General Exercises - Lower Extremity Ankle Circles/Pumps: AROM;Both;5 reps Quad Sets: AROM;Left;5 reps;Supine Long Arc Quad: AROM;Left;10 reps;Seated   Assessment/Plan    PT Assessment Patient needs continued PT services  PT Problem List Decreased strength;Decreased range of motion;Decreased activity tolerance;Decreased balance;Decreased mobility;Decreased coordination;Decreased cognition;Decreased knowledge of use of DME;Pain       PT Treatment Interventions DME instruction;Gait training;Stair training;Functional mobility training;Therapeutic activities;Balance training    PT Goals (Current goals can be found in the Care Plan section)  Acute Rehab PT Goals Patient Stated Goal: not stated PT Goal Formulation: With patient Time For Goal Achievement: 04/11/21 Potential to Achieve Goals: Good    Frequency Min 3X/week   Barriers to discharge        Co-evaluation PT/OT/SLP  Co-Evaluation/Treatment: Yes Reason for Co-Treatment: Complexity of the patient's impairments (multi-system involvement) PT goals addressed during session: Mobility/safety with mobility OT goals addressed during session: ADL's and self-care       AM-PAC PT "6 Clicks" Mobility  Outcome Measure Help needed turning from your back to your side while in a flat bed without using bedrails?: A Lot Help needed moving from lying on your back to sitting on the side of a flat bed without using bedrails?: A Lot Help needed moving to and from a bed to a chair (including a wheelchair)?: A Lot Help needed standing up from a chair using your arms (e.g., wheelchair or bedside chair)?: A Little Help needed to walk in hospital room?: Total Help needed climbing 3-5 steps with a railing? : Total 6 Click Score: 11    End of Session Equipment Utilized During Treatment: Gait belt;Oxygen Activity Tolerance: Patient limited by fatigue;Patient limited by pain   Nurse Communication: Mobility status (RN present to assist with transfer, address bleeding IV and place coccyx pad on bottom in standing) PT Visit Diagnosis: Unsteadiness on feet (R26.81);Muscle weakness (generalized) (M62.81);Pain;Difficulty in walking, not  elsewhere classified (R26.2) Pain - Right/Left: Left Pain - part of body: Shoulder;Hip;Leg    Time: 7185-5015 PT Time Calculation (min) (ACUTE ONLY): 36 min   Charges:   PT Evaluation $PT Eval Moderate Complexity: 1 Mod          Kittie Plater, PT, DPT Acute Rehabilitation Services Pager #: [phone] Office #: [phone]   Berline Lopes 03/28/2021, 1:47 PM

## 2021-03-28 NOTE — Progress Notes (Addendum)
Occupational Therapy Evaluation Patient Details Name: Savannah Coleman MRN: [id number] DOB: 25-May-1931 Today's Date: 03/28/2021    History of Present Illness Savannah Coleman is an 87 y.o. female who was sustained a  left hip fracture, left distal radius fracture and left proximal humerus fracture after a mechanical fall in the bathroom trying to kill a spider. Pt underwent ORIF to L hip and L wrist however is planned for L reverse shld surgery on wednesday 4/13. PSH: bilat THA, bilat TSA, R IM nail to femur fx 04/2020 PMH: COPD, HTN, anemia   Clinical Impression   Savannah Coleman is in great spirits and pleasant to work with. Prior to admission she lived at [facility] and completed most ADLs independently, she required assistance with bathing. Savannah Coleman completed bed mobility and supine<>sit transfer with mod/max A +2 for physical movement and safety. Savannah Coleman fatigued quickly in sitting and required trunk support. Savannah Coleman would benefit from OT to follow acutely to progress function in mobility and ADLs. Plan to discharge to SNF level care, pt currently resides at [facility], need to communicate her required level of care to ensure they can provide necessary support. Pt has scheduled sx on RUE 4/13.    Follow Up Recommendations  SNF (pt previously at [facility], if they are able to support her level of care at d/c, recommend to return to the same facility.)    Equipment Recommendations  3 in 1 bedside commode;Tub/shower bench       Precautions / Restrictions Precautions Precautions: Fall Precaution Comments: pt going back to OR for L reverse shld surgery on wednesday Required Braces or Orthoses: Sling (L UE) Restrictions Weight Bearing Restrictions: Yes LUE Weight Bearing: Non weight bearing LLE Weight Bearing: Weight bearing as tolerated      Mobility Bed Mobility Overal bed mobility: Needs Assistance Bed Mobility: Supine to Sit;Sit to Supine     Supine to sit: Max assist;+2 for  physical assistance;HOB elevated Sit to supine: +2 for physical assistance;Max assist;HOB elevated   General bed mobility comments: pt with increased pain in L LE with movement to EOB requiring maxA for LE management and trunk elevation, pt requiring maxA for LE management back into bed and then maxAX2 to pull up to [facility]    Transfers Overall transfer level: Needs assistance Equipment used: 2 person hand held assist (gait belt) Transfers: Sit to/from Stand Sit to Stand: Mod assist;+2 physical assistance         General transfer comment: attempted to side step to EOB, completed 4 steps, maxA to advance L LE due to "my socks are gripping"        ADL either performed or assessed with clinical judgement   ADL Overall ADL's : Needs assistance/impaired Eating/Feeding: Set up;Bed level   Grooming: Wash/dry hands;Wash/dry face;Oral care;Brushing hair;Set up;Bed level   Upper Body Bathing: Moderate assistance;Adhering to UE precautions;Bed level   Lower Body Bathing: Maximal assistance;+2 for physical assistance;Sit to/from stand   Upper Body Dressing : Moderate assistance;Adhering to UE precautions;Sitting   Lower Body Dressing: Maximal assistance;+2 for physical assistance;Sit to/from stand   Toilet Transfer: Maximal assistance;+2 for physical assistance   Toileting- Clothing Manipulation and Hygiene: Maximal assistance;+2 for physical assistance;Sit to/from stand       Functional mobility during ADLs:  (Pt required max A +2 for physical movement to transfer from supine to sit. While sitting pt was min A for balance. Sit to stand trasnfer was mod A +2 for physical lifting and balance.) General ADL Comments: pt required total  A for rear perineal hygiene this session                  Pertinent Vitals/Pain Pain Assessment: Faces Faces Pain Scale: Hurts even more Pain Location: LUE and LLE Pain Descriptors / Indicators: Discomfort;Grimacing;Guarding Pain Intervention(s):  Premedicated before session;Monitored during session     Hand Dominance Right   Extremity/Trunk Assessment Upper Extremity Assessment Upper Extremity Assessment: Defer to OT evaluation (in sling, awaiting surgery on shld) RUE Deficits / Details: pt reports decreased fucntion of R hand at baseline, with complaints of decreased strength and thumb ROM. LUE Deficits / Details: pt with ORIF of distal LUE, with plan to have surgery on L proximal UE. Pt with normal sensation in L hand, and able to move fingers. Slight increased swelling at this time. Pt is NWB through LUE.   Lower Extremity Assessment Lower Extremity Assessment: RLE deficits/detail;LLE deficits/detail RLE Deficits / Details: generalized weakness LLE Deficits / Details: limited active movement due to onset of pain, able to initiate LAQ sitting EOB and could complete quad set and ankle pump   Cervical / Trunk Assessment Cervical / Trunk Assessment: Kyphotic   Communication Communication Communication: No difficulties   Cognition Arousal/Alertness: Awake/alert Behavior During Therapy: WFL for tasks assessed/performed Overall Cognitive Status: Within Functional Limits for tasks assessed                                     General Comments  Pt with bruising on L UE and LE, VSS on 2Lo2 via [place]    Exercises Exercises: General Lower Extremity General Exercises - Lower Extremity Ankle Circles/Pumps: AROM;Both;5 reps Quad Sets: AROM;Left;5 reps;Supine Long Arc Quad: AROM;Left;10 reps;Seated   Shoulder Instructions      Home Living Family/patient expects to be discharged to:: Skilled nursing facility                                 Additional Comments: [facility]      Prior Functioning/Environment Level of Independence: Needs assistance  Gait / Transfers Assistance Needed: Ambulated with rollator ADL's / Homemaking Assistance Needed: Required assistance for bathing   Comments: Pt reported  being independent with all ADLs wtih exception of bathing        OT Problem List: Decreased strength;Decreased range of motion;Decreased activity tolerance;Impaired balance (sitting and/or standing);Decreased safety awareness;Decreased knowledge of use of DME or AE;Pain;Impaired UE functional use      OT Treatment/Interventions: Self-care/ADL training;Therapeutic exercise;Energy conservation;DME and/or AE instruction;Modalities;Splinting;Therapeutic activities;Patient/family education;Balance training    OT Goals(Current goals can be found in the care plan section) Acute Rehab OT Goals Patient Stated Goal: not stated OT Goal Formulation: With patient Time For Goal Achievement: 04/11/21 Potential to Achieve Goals: Good ADL Goals Pt Will Perform Upper Body Bathing: with set-up;sitting Pt Will Perform Lower Body Bathing: with adaptive equipment;sit to/from stand;with min assist Pt Will Perform Upper Body Dressing: with set-up;sitting Pt Will Perform Lower Body Dressing: with min guard assist;with adaptive equipment;sit to/from stand Pt Will Transfer to Toilet: with min assist;bedside commode Pt Will Perform Toileting - Clothing Manipulation and hygiene: with min guard assist;sit to/from stand Pt Will Perform Tub/Shower Transfer: with min assist;tub bench  OT Frequency: Min 2X/week       Co-evaluation PT/OT/SLP Co-Evaluation/Treatment: Yes Reason for Co-Treatment: Complexity of the patient's impairments (multi-system involvement) PT goals addressed during session:  Mobility/safety with mobility OT goals addressed during session: ADL's and self-care      AM-PAC OT "6 Clicks" Daily Activity     Outcome Measure Help from another person eating meals?: A Little Help from another person taking care of personal grooming?: A Little Help from another person toileting, which includes using toliet, bedpan, or urinal?: A Lot Help from another person bathing (including washing, rinsing, drying)?:  A Lot Help from another person to put on and taking off regular upper body clothing?: A Lot Help from another person to put on and taking off regular lower body clothing?: A Lot 6 Click Score: 14   End of Session Equipment Utilized During Treatment: Gait belt Nurse Communication: Mobility status;Weight bearing status  Activity Tolerance: Patient tolerated treatment well Patient left: in bed;with call bell/phone within reach;with bed alarm set;with nursing/sitter in room  OT Visit Diagnosis: Unsteadiness on feet (R26.81);Other abnormalities of gait and mobility (R26.89);Repeated falls (R29.6);Muscle weakness (generalized) (M62.81);History of falling (Z91.81);Pain Pain - Right/Left: Left Pain - part of body: Arm;Leg                Time: 2800-3491 OT Time Calculation (min): 35 min Charges:  OT General Charges $OT Visit: 1 Visit OT Evaluation $OT Eval Moderate Complexity: 1 Mod   Hevin Jeffcoat A Makalah Asberry 03/28/2021, 2:17 PM

## 2021-03-28 NOTE — Progress Notes (Incomplete Revision)
Occupational Therapy Evaluation Patient Details Name: Savannah Coleman MRN: [id number] DOB: December 04, 1931 Today's Date: 03/28/2021    History of Present Illness Savannah Coleman is an 87 y.o. female who was sustained a  left hip fracture, left distal radius fracture and left proximal humerus fracture after a mechanical fall in the bathroom trying to kill a spider. Pt underwent ORIF to L hip and L wrist however is planned for L reverse shld surgery on wednesday 4/13. PSH: bilat THA, bilat TSA, R IM nail to femur fx 04/2020 PMH: COPD, HTN, anemia   Clinical Impression   Savannah Coleman is in great spirits and pleasant to work with. Prior to admission she lived at [facility] and completed most ADLs independently, she required assistance with bathing. Savannah Coleman completed bed mobility and supine<>sit transfer with mod/max A +2 for physical movement and safety. Savannah Coleman fatigued quickly in sitting and required trunk support. Savannah Coleman would benefit from OT to follow acutely to progress function in mobility and ADLs. Plan to discharge to SNF level care, pt currently resides at [facility], need to communicate her required level of care to ensure they can provide necessary support. Pt has scheduled sx on RUE 4/13.    Follow Up Recommendations  SNF (pt previously at [facility] facility, if they are able to support her level of care at d/c, recommend to return to the same facility.)    Equipment Recommendations  3 in 1 bedside commode;Tub/shower bench       Precautions / Restrictions Precautions Precautions: Fall Precaution Comments: pt going back to OR for L reverse shld surgery on wednesday Required Braces or Orthoses: Sling (L UE) Restrictions Weight Bearing Restrictions: Yes LUE Weight Bearing: Non weight bearing LLE Weight Bearing: Weight bearing as tolerated      Mobility Bed Mobility Overal bed mobility: Needs Assistance Bed Mobility: Supine to Sit;Sit to Supine     Supine to sit: Max assist;+2 for  physical assistance;HOB elevated Sit to supine: +2 for physical assistance;Max assist;HOB elevated   General bed mobility comments: pt with increased pain in L LE with movement to EOB requiring maxA for LE management and trunk elevation, pt requiring maxA for LE management back into bed and then maxAX2 to pull up to [facility]    Transfers Overall transfer level: Needs assistance Equipment used: 2 person hand held assist (gait belt) Transfers: Sit to/from Stand Sit to Stand: Mod assist;+2 physical assistance         General transfer comment: attempted to side step to EOB, completed 4 steps, maxA to advance L LE due to "my socks are gripping"        ADL either performed or assessed with clinical judgement   ADL Overall ADL's : Needs assistance/impaired Eating/Feeding: Set up;Bed level   Grooming: Wash/dry hands;Wash/dry face;Oral care;Brushing hair;Set up;Bed level   Upper Body Bathing: Moderate assistance;Adhering to UE precautions;Bed level   Lower Body Bathing: Maximal assistance;+2 for physical assistance;Sit to/from stand   Upper Body Dressing : Moderate assistance;Adhering to UE precautions;Sitting   Lower Body Dressing: Maximal assistance;+2 for physical assistance;Sit to/from stand   Toilet Transfer: Maximal assistance;+2 for physical assistance   Toileting- Clothing Manipulation and Hygiene: Maximal assistance;+2 for physical assistance;Sit to/from stand       Functional mobility during ADLs:  (Pt required max A +2 for physical movement to transfer from supine to sit. While sitting pt was min A for balance. Sit to stand trasnfer was mod A +2 for physical lifting and balance.) General ADL Comments: pt required total  A for rear perineal hygiene this session                  Pertinent Vitals/Pain Pain Assessment: Faces Faces Pain Scale: Hurts even more Pain Location: LUE and LLE Pain Descriptors / Indicators: Discomfort;Grimacing;Guarding Pain Intervention(s):  Premedicated before session;Monitored during session     Hand Dominance Right   Extremity/Trunk Assessment Upper Extremity Assessment Upper Extremity Assessment: Defer to OT evaluation (in sling, awaiting surgery on shld) RUE Deficits / Details: pt reports decreased fucntion of R hand at baseline, with complaints of decreased strength and thumb ROM. LUE Deficits / Details: pt with ORIF of distal LUE, with plan to have surgery on L proximal UE. Pt with normal sensation in L hand, and able to move fingers. Slight increased swelling at this time. Pt is NWB through LUE.   Lower Extremity Assessment Lower Extremity Assessment: RLE deficits/detail;LLE deficits/detail RLE Deficits / Details: generalized weakness LLE Deficits / Details: limited active movement due to onset of pain, able to initiate LAQ sitting EOB and could complete quad set and ankle pump   Cervical / Trunk Assessment Cervical / Trunk Assessment: Kyphotic   Communication Communication Communication: No difficulties   Cognition Arousal/Alertness: Awake/alert Behavior During Therapy: WFL for tasks assessed/performed Overall Cognitive Status: Within Functional Limits for tasks assessed                                     General Comments  Pt with bruising on L UE and LE, VSS on 2Lo2 via [place]    Exercises Exercises: General Lower Extremity General Exercises - Lower Extremity Ankle Circles/Pumps: AROM;Both;5 reps Quad Sets: AROM;Left;5 reps;Supine Long Arc Quad: AROM;Left;10 reps;Seated   Shoulder Instructions      Home Living Family/patient expects to be discharged to:: Skilled nursing facility                                 Additional Comments: [facility]      Prior Functioning/Environment Level of Independence: Needs assistance  Gait / Transfers Assistance Needed: Ambulated with rollator ADL's / Homemaking Assistance Needed: Required assistance for bathing   Comments: Pt reported  being independent with all ADLs wtih exception of bathing        OT Problem List: Decreased strength;Decreased range of motion;Decreased activity tolerance;Impaired balance (sitting and/or standing);Decreased safety awareness;Decreased knowledge of use of DME or AE;Pain;Impaired UE functional use      OT Treatment/Interventions: Self-care/ADL training;Therapeutic exercise;Energy conservation;DME and/or AE instruction;Modalities;Splinting;Therapeutic activities;Patient/family education;Balance training    OT Goals(Current goals can be found in the care plan section) Acute Rehab OT Goals Patient Stated Goal: not stated OT Goal Formulation: With patient Time For Goal Achievement: 04/11/21 Potential to Achieve Goals: Good ADL Goals Pt Will Perform Upper Body Bathing: with set-up;sitting Pt Will Perform Lower Body Bathing: with adaptive equipment;sit to/from stand;with min assist Pt Will Perform Upper Body Dressing: with set-up;sitting Pt Will Perform Lower Body Dressing: with min guard assist;with adaptive equipment;sit to/from stand Pt Will Transfer to Toilet: with min assist;bedside commode Pt Will Perform Toileting - Clothing Manipulation and hygiene: with min guard assist;sit to/from stand Pt Will Perform Tub/Shower Transfer: with min assist;tub bench  OT Frequency: Min 2X/week       Co-evaluation PT/OT/SLP Co-Evaluation/Treatment: Yes Reason for Co-Treatment: Complexity of the patient's impairments (multi-system involvement) PT goals addressed during session:  Mobility/safety with mobility OT goals addressed during session: ADL's and self-care      AM-PAC OT "6 Clicks" Daily Activity     Outcome Measure Help from another person eating meals?: A Little Help from another person taking care of personal grooming?: A Little Help from another person toileting, which includes using toliet, bedpan, or urinal?: A Lot Help from another person bathing (including washing, rinsing, drying)?:  A Lot Help from another person to put on and taking off regular upper body clothing?: A Lot Help from another person to put on and taking off regular lower body clothing?: A Lot 6 Click Score: 14   End of Session Equipment Utilized During Treatment: Gait belt Nurse Communication: Mobility status;Weight bearing status  Activity Tolerance: Patient tolerated treatment well Patient left: in bed;with call bell/phone within reach;with bed alarm set;with nursing/sitter in room  OT Visit Diagnosis: Unsteadiness on feet (R26.81);Other abnormalities of gait and mobility (R26.89);Repeated falls (R29.6);Muscle weakness (generalized) (M62.81);History of falling (Z91.81);Pain Pain - Right/Left: Left Pain - part of body: Arm;Leg                Time: 8280-0349 OT Time Calculation (min): 35 min Charges:  OT General Charges $OT Visit: 1 Visit OT Evaluation $OT Eval Moderate Complexity: 1 Mod   Samantha A Droese 03/28/2021, 2:17 PM

## 2021-03-28 NOTE — Plan of Care (Signed)

## 2021-03-28 NOTE — Progress Notes (Signed)
Orthopaedic Trauma Progress Note  SUBJECTIVE: Fairly uncomfortable this morning. Reports moderate to severe pain in the left hip as well as the left shoulder. Has received minimal pain medications since last night. Does take Hydrocodone on a daily basis at baseline. No chest pain. No SOB. No nausea/vomiting. No other complaints.   OBJECTIVE:  Vitals:   03/28/21 0546 03/28/21 0831  BP: (!) 137/121 (!) 164/89  Pulse: 89 96  Resp: 16 18  Temp: (!) 97.4 F (36.3 C) 98.6 F (37 C)  SpO2: 100% 100%    General: Laying in bed, NAD but appears uncomfortable Respiratory: No increased work of breathing.  Left Lower Extremity: Dressings CDI. Tender throughout thigh as expected. Did not attempt hip/knee motion due to pain at baseline. Tolerated gentle ankle motion but endorses discomfort in the hip with dorsiflexion. Compartments soft and compressible. Endorses sensation to light touch distally. +DP pulse Left Upper Extremity: Well-padded, well-fitting short arm splint in place. Sling in place. Bruising about the shoulder noted. Motion of elbow not attempted secondary to baseline pain. Able to wiggle fingers. Fingers warm and well perfused with brisk cap refill.    IMAGING: Stable post op imaging.   LABS:  Results for orders placed or performed during the hospital encounter of 03/26/21 (from the past 24 hour(s))  Basic metabolic panel     Status: Abnormal   Collection Time: 03/28/21  2:32 AM  Result Value Ref Range   Sodium 136 135 - 145 mmol/L   Potassium 4.1 3.5 - 5.1 mmol/L   Chloride 109 98 - 111 mmol/L   CO2 24 22 - 32 mmol/L   Glucose, Bld 134 (H) 70 - 99 mg/dL   BUN 14 8 - 23 mg/dL   Creatinine, Ser 0.72 0.44 - 1.00 mg/dL   Calcium 8.6 (L) 8.9 - 10.3 mg/dL   GFR, Estimated >60 >60 mL/min   Anion gap 3 (L) 5 - 15  CBC     Status: Abnormal   Collection Time: 03/28/21  2:32 AM  Result Value Ref Range   WBC 15.3 (H) 4.0 - 10.5 K/uL   RBC 2.68 (L) 3.87 - 5.11 MIL/uL   Hemoglobin 8.9  (L) 12.0 - 15.0 g/dL   HCT 26.7 (L) 36.0 - 46.0 %   MCV 99.6 80.0 - 100.0 fL   MCH 33.2 26.0 - 34.0 pg   MCHC 33.3 30.0 - 36.0 g/dL   RDW 12.9 11.5 - 15.5 %   Platelets 170 150 - 400 K/uL   nRBC 0.0 0.0 - 0.2 %    ASSESSMENT: Savannah Coleman is a 87 y.o. female, 1 Day Post-Op  Injuries: 1.  Left intertrochanteric femur fracture s/p intramedullary nailing 2.  Left distal radius/ulna fracture s/p ORIF 3.  Left proximal humerus fracture s/p closed treatment  CV/Blood loss: Acute blood loss anemia, Hgb 8.9 this morning. Hemodynamically stable  PLAN: Weightbearing: WBAT LLE, NWB LUE.  Encourage left elbow motion as tolerated.  Would avoid any aggressive shoulder motion. Incisional and dressing care: Plan to move left hip dressings tomorrow.  Continue splint on left wrist. Showering: Hold off for now Orthopedic device(s): Splint left wrist.  Sling LUE for comfort Pain management:  1. Tylenol 650 mg q 6 hours scheduled 2. Norco 5-325 daily 3. Oxycodone 2.5-5 mg q 6 hours PRN 4. Tizanidine 2 mg q 8 hours PRN 5. Dilaudid 0.25 mg q 4 hours PRN VTE prophylaxis: Lovenox, SCDs ID:  Ancef 2gm post op completed Foley/Lines:  No foley, KVO IVFs  Impediments to Fracture Healing: Vitamin D level pending, will start supplementation as indicated Dispo: PT/OT evaluation today, dispo pending.  Work on pain control. Plan to remove dressing to left hip tomorrow.  Patient scheduled for reverse shoulder arthroplasty with Dr. Stann Mainland on Wednesday, 03/30/2021.   Follow - up plan: We will continue to follow patient while in the hospital and plan for outpatient follow-up 2 weeks after discharge for repeat x-rays and wound check of the left hip and left wrist  Contact information:  Katha Hamming MD, Patrecia Pace PA-C. After hours and holidays please check [email or web address] for group call information for [facility]   Hunt Zajicek A. Ricci Barker, PA-C [phone] (office) [email or web address]

## 2021-03-28 NOTE — Progress Notes (Addendum)
Family Medicine Teaching Service Daily Progress Note Intern Pager: [phone]  Patient name: Savannah Coleman Medical record number: [id number] Date of birth: 1931/11/19 Age: 87 y.o. Gender: female  Primary Care Provider: Pcp, No Consultants: orthopedic surgery Code Status: Full  Pt Overview and Major Events to Date:  4/10: Admitted   Assessment and Plan: Savannah Coleman is a 87 y.o. female presenting with fall resulting in multiple fractures. PMH is significant for COPD, HLD, HTN, R hip arthroplasty, lumbar fusion, bilateral wrist surgery  Fall with multiple left-sided fractures New fractures including L proximal humerus, impacted L basicervical femur, L distal radius and ulna seen on x-ray after fall in bathroom at SNF. Head and neck CT showed no acute intracranial or c-spine injury. XR left wrist noted to have significantly improved position and alignment from previously demonstrated distal radius and ulna fractures with hardware fixation of the distal radius fracture. Risk factors: H/o multiple falls and fractures- 04/2020 had fall in shower resulting in R hip and wrist fxs, PDMP review reveals Overdose risk score 460 but last known prescription of controlled substance in database is from 08/2020. Currently tolerating pain well under existing pain management.  -ortho consulted, appreciate continued involvement and recs  - early mobilization -Tylenol 650 mg as needed -Hydrocodone 5/325 1 to 2 tablets q4h prn -oxycodone IR 2.5 mg q6h prn for breakthrough pain - scheduled miralax and PRN dulcolax/senna -Continue splint -pending Vitamin D, consider starting bisphosphonate therapy  -incentive spirometry  -plan for procedure for left proximal humerus fracture on 4/13 -PT/OT eval and treat   Normocytic Anemia Hgb 8.9, unknown of exact etiology although post surgical procedure. Prior labs demonstrate macrocytic anemia. Folate and B12 workup unremarkable.  -monitor daily CBC -consider ferritin  and iron studies   Leukocytosis Improving, 15.3 this morning. Likely reactive. No signs of active infection at this time. -continue to monitor CBC  Hypokalemia Hypokalemia of 2.8 on admission. Likely resolved at 4.1 this morning.  -monitor BMP -repletion as appropriate   HLD Home meds: Simvastatin 20mg . No recent lipid panel in chart. Appears to be given as primary prevention. Would consider discontinuing.  Holding simvastatin for now -lipid panel outpatient   Hypertension  Home meds include amlodipine 5mg , ? metoprolol. Normotensive this morning 119/86. -continue home amlodipine  GERD Home meds include pantoprazole.  -Pepcid prn  FEN/GI: heart healthy  PPx: Lovenox    Status is: Inpatient  Remains inpatient appropriate because:Inpatient level of care appropriate due to severity of illness   Dispo: The patient is from: SNF              Anticipated d/c is to: SNF              Patient currently is not medically stable to d/c.   Difficult to place patient No        Subjective:  No acute overnight events reported. Endorsing mild left shoulder pain and left leg pain. Denies chest pain, dyspnea and other symptoms.   Objective: Temp:  [97 F (36.1 C)-98.6 F (37 C)] 97.4 F (36.3 C) (04/11 0546) Pulse Rate:  [73-119] 89 (04/11 0546) Resp:  [13-19] 16 (04/11 0546) BP: (107-174)/(77-121) 137/121 (04/11 0546) SpO2:  [95 %-100 %] 100 % (04/11 0546) Physical Exam: General: Patient laying in bed comfortably, in no acute distress. Cardiovascular: RRR, no murmurs or gallops auscultated  Respiratory: CTAB, no rales, rhonchi or wheezing noted, breathing comfortably on 2L O2 supplementation  Abdomen: soft, nontender, BS+ Extremities: SCDs in place, radial and distal  pulses strong and equal bilaterally  Psych: mood appropriate   Laboratory: Recent Labs  Lab 03/27/21 0003 03/27/21 0903 03/28/21 0232  WBC 9.3 19.6* 15.3*  HGB 13.6 12.7 8.9*  HCT 41.2 38.2 26.7*   PLT 241 248 170   Recent Labs  Lab 03/27/21 0003 03/27/21 0903 03/28/21 0232  NA 137  --  136  K 2.8*  --  4.1  CL 102  --  109  CO2 28  --  24  BUN 9  --  14  CREATININE 0.70 0.66 0.72  CALCIUM 9.4  --  8.6*  PROT 6.5  --   --   BILITOT 0.6  --   --   ALKPHOS 66  --   --   ALT 22  --   --   AST 28  --   --   GLUCOSE 150*  --  134*      Imaging/Diagnostic Tests: DG Wrist 2 Views Left  Result Date: 03/27/2021 CLINICAL DATA:  Status post hardware fixation of a previously described distal radius fracture. EXAM: LEFT WRIST - 2 VIEW COMPARISON:  Earlier today. FINDINGS: Screw plate fixation of the previously demonstrated comminuted distal radius fracture with significantly improved position and alignment. There is residual radial displacement of the distal fragment with no angulation. There is also significantly improved position and alignment of the previously described comminuted distal ulna fracture. The major fragments are in essentially anatomic position and alignment now. IMPRESSION: Significantly improved position and alignment of the previously demonstrated distal radius and ulna fractures with hardware fixation of the distal radius fracture. Electronically Signed   By: Claudie Revering M.D.   On: 03/27/2021 18:48   DG Wrist Complete Left  Result Date: 03/27/2021 CLINICAL DATA:  Distal radius and ulna fractures. EXAM: LEFT WRIST - 3 VIEW; DG C-ARM 1-60 MIN COMPARISON:  Earlier today. FINDINGS: Five C-arm views of the left wrist demonstrate screw and plate fixation of the previously described comminuted distal radius fracture significantly improved position and alignment of the major fragments. There is some residual radial displacement of the distal fragment. There is also significantly improved position and alignment of the fragments of the previously described distal ulna fracture without hardware fixation. The alignment for the ulnar fracture fragments appears anatomical on these  images. IMPRESSION: 1. Hardware fixation of the previously described comminuted distal radius fracture with significantly improved position and alignment of the major fragments. 2. Markedly improved position and alignment of the previously described distal ulna fracture without hardware fixation. Electronically Signed   By: Claudie Revering M.D.   On: 03/27/2021 18:46   DG C-Arm 1-60 Min  Result Date: 03/27/2021 CLINICAL DATA:  Distal radius and ulna fractures. EXAM: LEFT WRIST - 3 VIEW; DG C-ARM 1-60 MIN COMPARISON:  Earlier today. FINDINGS: Five C-arm views of the left wrist demonstrate screw and plate fixation of the previously described comminuted distal radius fracture significantly improved position and alignment of the major fragments. There is some residual radial displacement of the distal fragment. There is also significantly improved position and alignment of the fragments of the previously described distal ulna fracture without hardware fixation. The alignment for the ulnar fracture fragments appears anatomical on these images. IMPRESSION: 1. Hardware fixation of the previously described comminuted distal radius fracture with significantly improved position and alignment of the major fragments. 2. Markedly improved position and alignment of the previously described distal ulna fracture without hardware fixation. Electronically Signed   By: Percell Locus.D.  On: 03/27/2021 18:46   DG HIP PORT UNILAT W OR W/O PELVIS 1V LEFT  Result Date: 03/27/2021 CLINICAL DATA:  Status post hardware fixation of a previously demonstrated comminuted left intertrochanteric fracture. EXAM: DG HIP (WITH OR WITHOUT PELVIS) 1V PORT LEFT COMPARISON:  Earlier today. FINDINGS: Interval intramedullary nail and compression screw fixation of the previously demonstrated comminuted left intertrochanteric fracture. Currently, anatomic position and alignment is demonstrated. Fixation hardware is also noted on the right. IMPRESSION:  Hardware fixation of the previously demonstrated comminuted left intertrochanteric fracture with anatomic position and alignment. Electronically Signed   By: Claudie Revering M.D.   On: 03/27/2021 18:49   DG HIP OPERATIVE UNILAT W OR W/O PELVIS LEFT  Result Date: 03/27/2021 CLINICAL DATA:  Hardware fixation of a recently demonstrated left intertrochanteric fracture. EXAM: OPERATIVE LEFT HIP (WITH PELVIS IF PERFORMED) 5 VIEWS TECHNIQUE: Fluoroscopic spot image(s) were submitted for interpretation post-operatively. COMPARISON:  Earlier today. FINDINGS: Five C-arm views of the left hip demonstrate intramedullary nail and compression screw fixation of the previously described left intertrochanteric fracture with anatomic position and alignment. IMPRESSION: Hardware fixation of the previously described left intertrochanteric fracture with anatomic position and alignment. Electronically Signed   By: Claudie Revering M.D.   On: 03/27/2021 19:04    Donney Dice, DO 03/28/2021, 7:42 AM PGY-1, [place] Intern pager: [phone], text pages welcome

## 2021-03-29 ENCOUNTER — Encounter (HOSPITAL_COMMUNITY): Payer: Self-pay | Admitting: Student

## 2021-03-29 DIAGNOSIS — M810 Age-related osteoporosis without current pathological fracture: Secondary | ICD-10-CM | POA: Diagnosis not present

## 2021-03-29 DIAGNOSIS — T07XXXA Unspecified multiple injuries, initial encounter: Secondary | ICD-10-CM | POA: Diagnosis not present

## 2021-03-29 DIAGNOSIS — D62 Acute posthemorrhagic anemia: Secondary | ICD-10-CM | POA: Diagnosis not present

## 2021-03-29 DIAGNOSIS — I10 Essential (primary) hypertension: Secondary | ICD-10-CM | POA: Diagnosis not present

## 2021-03-29 LAB — BASIC METABOLIC PANEL
Anion gap: 3 — ABNORMAL LOW (ref 5–15)
BUN: 13 mg/dL (ref 8–23)
CO2: 26 mmol/L (ref 22–32)
Calcium: 8.7 mg/dL — ABNORMAL LOW (ref 8.9–10.3)
Chloride: 107 mmol/L (ref 98–111)
Creatinine, Ser: 0.68 mg/dL (ref 0.44–1.00)
GFR, Estimated: >60 mL/min (ref >60)
Glucose, Bld: 102 mg/dL — ABNORMAL HIGH (ref 70–99)
Potassium: 4.6 mmol/L (ref 3.5–5.1)
Sodium: 136 mmol/L (ref 135–145)

## 2021-03-29 LAB — CBC
HCT: 23.3 % — ABNORMAL LOW (ref 36.0–46.0)
Hemoglobin: 7.8 g/dL — ABNORMAL LOW (ref 12.0–15.0)
MCH: 33.9 pg (ref 26.0–34.0)
MCHC: 33.5 g/dL (ref 30.0–36.0)
MCV: 101.3 fL — ABNORMAL HIGH (ref 80.0–100.0)
Platelets: 130 10*3/uL — ABNORMAL LOW (ref 150–400)
RBC: 2.3 MIL/uL — ABNORMAL LOW (ref 3.87–5.11)
RDW: 13.1 % (ref 11.5–15.5)
WBC: 11.7 10*3/uL — ABNORMAL HIGH (ref 4.0–10.5)
nRBC: 0 % (ref 0.0–0.2)

## 2021-03-29 LAB — VITAMIN D 25 HYDROXY (VIT D DEFICIENCY, FRACTURES): Vit D, 25-Hydroxy: 49.03 ng/mL (ref 30–100)

## 2021-03-29 MED ORDER — OXYCODONE HCL 5 MG PO TABS
2.5000 mg | ORAL_TABLET | Freq: Four times a day (QID) | ORAL | Status: AC
Start: 2021-03-29 — End: 2021-03-30
  Administered 2021-03-29 – 2021-03-30 (×4): 2.5 mg via ORAL
  Filled 2021-03-29 (×4): qty 1

## 2021-03-29 NOTE — Progress Notes (Signed)
Chaplain received phone call from nurse that patient is having surgery tomorrow and needs spiritual and/or emotional support. Chaplain visited with patient and had prayer. Daughter joined visit and said that patient's pastor will arrive tomorrow. Daughter and patient very appreciative of visit.

## 2021-03-29 NOTE — Progress Notes (Signed)
Orthopaedic Trauma Progress Note  SUBJECTIVE: Pain marginally better from yesterday, shoulder causing most of the issues. Is receiving her [ain medication on a scheduled basis which has been helpful. Is ready to get her surgery tomorrow. Was able to stand at bedside with therapies yesterday but did note pain in the leg. No chest pain. No SOB. No nausea/vomiting. No other complaints.   OBJECTIVE:  Vitals:   03/29/21 0639 03/29/21 0736  BP: (!) 142/89 (!) 145/78  Pulse: 95 95  Resp: 16 17  Temp: 98.6 F (37 C) 99.5 F (37.5 C)  SpO2: 93% 93%    General: Laying in bed, NAD but appears uncomfortable Respiratory: No increased work of breathing.  Left Lower Extremity: Dressings CDI. Tender through thigh as expected. Did not attempt hip/knee motion due to pain at baseline. Ankle dorsiflexion/planatrflexion intact and improved from yesterday. Compartments soft and compressible. Endorses sensation to light touch distally. +DP pulse Left Upper Extremity: Well-padded, well-fitting short arm splint in place. Sling in place. Bruising about the shoulder noted. Motion of elbow not attempted secondary to baseline pain. Able to wiggle fingers. Fingers warm and well perfused with brisk cap refill.    IMAGING: Stable post op imaging.   LABS:  Results for orders placed or performed during the hospital encounter of 03/26/21 (from the past 24 hour(s))  Glucose, capillary     Status: Abnormal   Collection Time: 03/28/21  5:40 PM  Result Value Ref Range   Glucose-Capillary 110 (H) 70 - 99 mg/dL  Basic metabolic panel     Status: Abnormal   Collection Time: 03/29/21  1:42 AM  Result Value Ref Range   Sodium 136 135 - 145 mmol/L   Potassium 4.6 3.5 - 5.1 mmol/L   Chloride 107 98 - 111 mmol/L   CO2 26 22 - 32 mmol/L   Glucose, Bld 102 (H) 70 - 99 mg/dL   BUN 13 8 - 23 mg/dL   Creatinine, Ser 0.68 0.44 - 1.00 mg/dL   Calcium 8.7 (L) 8.9 - 10.3 mg/dL   GFR, Estimated >60 >60 mL/min   Anion gap 3 (L) 5 -  15  CBC     Status: Abnormal   Collection Time: 03/29/21  1:42 AM  Result Value Ref Range   WBC 11.7 (H) 4.0 - 10.5 K/uL   RBC 2.30 (L) 3.87 - 5.11 MIL/uL   Hemoglobin 7.8 (L) 12.0 - 15.0 g/dL   HCT 23.3 (L) 36.0 - 46.0 %   MCV 101.3 (H) 80.0 - 100.0 fL   MCH 33.9 26.0 - 34.0 pg   MCHC 33.5 30.0 - 36.0 g/dL   RDW 13.1 11.5 - 15.5 %   Platelets 130 (L) 150 - 400 K/uL   nRBC 0.0 0.0 - 0.2 %  VITAMIN D 25 Hydroxy (Vit-D Deficiency, Fractures)     Status: None   Collection Time: 03/29/21  1:42 AM  Result Value Ref Range   Vit D, 25-Hydroxy 49.03 30 - 100 ng/mL    ASSESSMENT: Savannah Coleman is a 87 y.o. female, 2 Days Post-Op  Injuries: 1.  Left intertrochanteric femur fracture s/p intramedullary nailing 2.  Left distal radius/ulna fracture s/p ORIF 3.  Left proximal humerus fracture s/p closed treatment  CV/Blood loss: Acute blood loss anemia, Hgb 7.8 this morning. Hemodynamically stable  PLAN: Weightbearing: WBAT LLE, NWB LUE.  Encourage left elbow motion as tolerated.  Would avoid any aggressive shoulder motion. Incisional and dressing care: Ok to leave hip incisions open to air. Continue  splint on left wrist. Showering: Hold off for now Orthopedic device(s): Splint left wrist.  Sling LUE for comfort Pain management:  1. Tylenol 650 mg q 6 hours scheduled 2. Norco 5-325 daily 3. Oxycodone 2.5 mg q QID 4. Tizanidine 2 mg q 8 hours PRN 5. Dilaudid 0.25 mg q 4 hours PRN VTE prophylaxis: Lovenox, SCDs ID:  Ancef 2gm post op completed Foley/Lines:  No foley, KVO IVFs Impediments to Fracture Healing: Vitamin D level pending, will start supplementation as indicated Dispo: Therapies as tolerated.  Patient scheduled for reverse shoulder arthroplasty with Dr. Stann Mainland on Wednesday, 03/30/2021.  Continue to follow CBC, may need blood transfusion prior to surgery.  Follow - up plan: We will continue to follow patient while in the hospital and plan for outpatient follow-up 2 weeks after  discharge for repeat x-rays and wound check of the left hip and left wrist  Contact information:  Katha Hamming MD, Patrecia Pace PA-C. After hours and holidays please check [email or web address] for group call information for [facility]   [name] A. Ricci Barker, PA-C [phone] (office) [email or web address]

## 2021-03-29 NOTE — Progress Notes (Signed)
Physical Therapy Treatment Patient Details Name: Savannah Coleman MRN: [id number] DOB: October 24, 1931 Today's Date: 03/29/2021    History of Present Illness Savannah Coleman is an 87 y.o. female who was sustained a  left hip fracture, L distal radius fx and L proximal humerus fracture after a mechanical fall in the bathroom trying to kill a spider. Pt underwent ORIF to L hip and L wrist however is planned for L reverse shld surgery on wednesday 4/13. PSH: bilat THA, bilat TSA, R IM nail to femur fx 04/2020 PMH: COPD, HTN, anemia    PT Comments    Pt received in supine, agreeable to therapy session with significant encouragement and with fair participation and tolerance for mobility. Pt very pain limited and needing increased assist (+3 assist for pt comfort/safety) for supine<>seated posture and needs min guard to minA for seated balance. Pt unable to tolerate sitting EOB >5 minutes due to severe LUE/LLE pain and also having difficulty with AROM (weak quad set on LLE). Significant time spent reviewing HEP for supine posture and importance of participating in therapy session within tolerance. Per chart review, pt to OR tomorrow for reverse L TSA and may need PT re-eval next session if WB orders change. Pt continues to benefit from PT services to progress toward functional mobility goals. Continue to recommend SNF.   Follow Up Recommendations  SNF;Supervision/Assistance - 24 hour (return to [facility] if they have SNF services)     Equipment Recommendations  Other (comment) (TBD at next venue)    Recommendations for Other Services       Precautions / Restrictions Precautions Precautions: Fall Precaution Comments: pt going back to OR for L reverse shld surgery on wednesday Required Braces or Orthoses: Sling;Splint/Cast (L UE sling and L wrist splint) Restrictions Weight Bearing Restrictions: Yes LUE Weight Bearing: Non weight bearing LLE Weight Bearing: Weight bearing as tolerated    Mobility  Bed  Mobility Overal bed mobility: Needs Assistance Bed Mobility: Supine to Sit;Sit to Supine     Supine to sit: Max assist;+2 for physical assistance;HOB elevated (+3 (RN assisting at feet/legs, one person behind her for back support and one supporting RUE/trunk)) Sit to supine: +2 for physical assistance;HOB elevated;Total assist (+3)   General bed mobility comments: pt with increased pain in L LE with movement to EOB requiring +2 maxA for LE management and trunk elevation, pt requiring +2-3 maxA for LE management back into bed and then totalA +2-3 for posterior supine scooting toward [facility] with bed pads    Transfers                 General transfer comment: pt too tearful/pain limited to attempt this date  Ambulation/Gait             General Gait Details: pt daughter requested PTA not attempt transfers/gait with her   Stairs             Wheelchair Mobility    Modified Rankin (Stroke Patients Only)       Balance Overall balance assessment: Needs assistance Sitting-balance support: Feet supported;Single extremity supported Sitting balance-Leahy Scale: Poor Sitting balance - Comments: poor sitting tolerance, pain limited and pt/daughter requesting staff return her to supine after <5 minutes seated EOB; reliant on RUE support and too painful to sit fully upright needs to lean to R to offload L hip       Standing balance comment: pt refusing/too painful  Cognition Arousal/Alertness: Awake/alert Behavior During Therapy: Anxious (tearful at times) Overall Cognitive Status: Within Functional Limits for tasks assessed                                 General Comments: pt very anxious regarding mobility and hesitant to initiate; pt family also not wanting her to be in pain; reviewed importance of early mobilization after surgery within tolerance for improved outcomes/prevention of stroke/blood clots, pt/family will  likely need reinforcement.      Exercises General Exercises - Lower Extremity Ankle Circles/Pumps: AROM;Both;20 reps Quad Sets: AROM;Left;Supine;10 reps Gluteal Sets: AROM;Both;5 reps;Supine Long Arc Quad:  (pt unwilling to attempt) Hip ABduction/ADduction: AROM;Supine;Right (unable to attempt on LLE) Hip Flexion/Marching: AROM;Left;5 reps;Supine;Limitations Hip Flexion/Marching Limitations: very painful, pt self-limiting to ~1-2" heel slide, unable to bend actively >10 deg and not allowing staff to assist her with this    General Comments General comments (skin integrity, edema, etc.): VSS on 0.5L O2 [place], HR 90's bpm with mobility; needs cues for pursed-lip breathing      Pertinent Vitals/Pain Pain Assessment: 0-10 Pain Score: 8  Faces Pain Scale: Hurts whole lot Pain Location: LUE and LLE Pain Descriptors / Indicators: Discomfort;Grimacing;Guarding;Moaning;Crying Pain Intervention(s): Limited activity within patient's tolerance;Monitored during session;Repositioned;RN gave pain meds during session;Ice applied (pt daughter asking staff to lay her back down once she is seated EOB due to pt discomfort)    Home Living                      Prior Function            PT Goals (current goals can now be found in the care plan section) Acute Rehab PT Goals Patient Stated Goal: not stated PT Goal Formulation: With patient Time For Goal Achievement: 04/11/21 Potential to Achieve Goals: Good Progress towards PT goals: Progressing toward goals (slow progress, pain limiting)    Frequency    Min 3X/week      PT Plan Current plan remains appropriate    Co-evaluation              AM-PAC PT "6 Clicks" Mobility   Outcome Measure  Help needed turning from your back to your side while in a flat bed without using bedrails?: Total Help needed moving from lying on your back to sitting on the side of a flat bed without using bedrails?: A Lot Help needed moving to and  from a bed to a chair (including a wheelchair)?: Total Help needed standing up from a chair using your arms (e.g., wheelchair or bedside chair)?: Total Help needed to walk in hospital room?: Total Help needed climbing 3-5 steps with a railing? : Total 6 Click Score: 7    End of Session Equipment Utilized During Treatment: Gait belt;Oxygen Activity Tolerance: Patient limited by fatigue;Patient limited by pain Patient left: in bed;with call bell/phone within reach;with bed alarm set;with nursing/sitter in room;with family/visitor present;with SCD's reapplied (RN notified pt AC may be broken in her room, room very hot) Nurse Communication: Mobility status;Patient requests pain meds PT Visit Diagnosis: Unsteadiness on feet (R26.81);Muscle weakness (generalized) (M62.81);Pain;Difficulty in walking, not elsewhere classified (R26.2) Pain - Right/Left: Left Pain - part of body: Shoulder;Hip;Leg     Time: 1432-1500 PT Time Calculation (min) (ACUTE ONLY): 28 min  Charges:  $Therapeutic Exercise: 8-22 mins $Therapeutic Activity: 8-22 mins  Houston Siren., PTA Acute Rehabilitation Services Pager: [phone] Office: [place] 03/29/2021, 5:03 PM

## 2021-03-29 NOTE — Plan of Care (Signed)

## 2021-03-29 NOTE — TOC Initial Note (Addendum)
Transition of Care [facility]) - Initial/Assessment Note    Patient Details  Name: Savannah Coleman MRN: [id number] Date of Birth: 03/14/1931  Transition of Care [facility]) CM/SW Contact:    Sharin Mons, RN Phone Number: 03/29/2021, 8:09 AM  Clinical Narrative:                 Admitted 4/9 s/p a fall, suffered  left hip fracture, left distal radius fracture and left proximal humerus fracture.  Hx of bilat THA, bilat TSA, R IM nail to femur fx 04/2020 PMH: COPD, HTN, anemia. Pt is a LT resident at [facility].   - s/p ORIF to L hip and L wrist, 4/10   Plan: reverse shoulder surgery on wednesday 4/13.    TOC team following and will assist with TOC needs.     03/31/2021 Pt /family agreeable to pt returning to [facility] when medically ready.  Expected Discharge Plan: Skilled Nursing Facility Barriers to Discharge: Continued Medical Work up   Patient Goals and CMS Choice        Expected Discharge Plan and Services Expected Discharge Plan: [place]   Prior Living Arrangements/Services   Activities of Daily Living Home Assistive Devices/Equipment: Gilford Rile (specify type) ADL Screening (condition at time of admission) Patient's cognitive ability adequate to safely complete daily activities?: Yes Is the patient deaf or have difficulty hearing?: Yes Does the patient have difficulty seeing, even when wearing glasses/contacts?: No Does the patient have difficulty concentrating, remembering, or making decisions?: No Patient able to express need for assistance with ADLs?: Yes Does the patient have difficulty dressing or bathing?: Yes Independently performs ADLs?: Yes (appropriate for developmental age) Does the patient have difficulty walking or climbing stairs?: Yes Weakness of Legs: Left Weakness of Arms/Hands: Left  Permission Sought/Granted                  Emotional Assessment              Admission diagnosis:  Hypokalemia [E87.6] Multiple  fractures [T07.XXXA] Fall [W19.XXXA] Closed fracture of left distal radius and ulna, initial encounter [S52.502A, S52.602A] Closed fracture of neck of left femur, initial encounter ([place]) [S72.002A] Fall at nursing home, initial encounter [W19.XXXA, Y92.129] Closed fracture of anatomical neck of left humerus, initial encounter [S42.292A] Patient Active Problem List   Diagnosis Date Noted  . Multiple fractures 03/27/2021  . Hypokalemia 03/27/2021  . Recurrent falls 03/27/2021  . Closed fracture of neck of left femur ([place]) 03/27/2021  . Closed fracture of left distal radius and ulna, initial encounter 03/27/2021  . Left humeral neck fracture 03/27/2021  . Essential hypertension 03/27/2021  . Hyperlipidemia 03/27/2021  . Resides in long term care facility 03/27/2021  . Osteoporosis 03/27/2021  . Closed fracture of anatomical neck of left humerus   . Fall at nursing home    PCP:  Pcp, No Pharmacy:  No Pharmacies Listed    Social Determinants of Health (SDOH) Interventions    Readmission Risk Interventions No flowsheet data found.

## 2021-03-29 NOTE — Progress Notes (Addendum)
Family Medicine Teaching Service Daily Progress Note Intern Pager: [phone]  Patient name: Savannah Coleman Medical record number: [id number] Date of birth: [birth year] Age: 87 y.o. Gender: female  Primary Care Provider: Pcp, No Consultants: orthopedic surgery Code Status: Full  Pt Overview and Major Events to Date:  4/10: Admitted   Assessment and Plan: Savannah Coleman a 87 y.o.femalepresenting with fall resulting in multiple fractures. PMH is significant forCOPD, HLD, HTN, R hip arthroplasty, lumbar fusion, bilateral wrist surgery  Fall with multiple left-sided fractures New fractures including L proximal humerus, impacted L basicervical femur, L distal radius and ulnaseen on x-rayafter fallin bathroom at SNF. Head and neck CT showed no acute intracranial or c-spine injury. XR left wrist noted to have significantly improved position and alignment from previously demonstrated distal radius and ulna fractures with hardware fixation of the distal radius fracture.Risk factors: H/omultiple falls and fractures-04/2020 had fall in shower resulting in R hip and wrist fractures. Currently tolerating pain well under existing pain management. Vitamin D 49.03 wnl.  -ortho consulted, appreciate continued involvement and recs  - early mobilization -Tylenol 650 mg as needed -oxycodone IR 2.5 mg q6h prn  -dilautid 0.25 mg q6h prn for breakthrough pain  - scheduled miralax and PRN dulcolax/senna -Continue splint -consider starting bisphosphonate therapy -incentive spirometry  -plan for procedure for left proximal humerus fracture on 4/13 -PT/OT eval and treat   Normocytic Anemia Hgb 7.8, unknown of exact etiology although likely post-operative or dilutional as she is on fluids. Prior labs demonstrate macrocytic anemia. Folate and B12 workup unremarkable. No known source of bleeding noted, transfusion threshold 7. -monitor daily CBC -consider ferritin and iron  studies  Leukocytosis Improving, 11.7 this morning. Likely reactive. No signs of active infection at this time. -continue to monitor CBC  Hypokalemia Resolved, K 4.6 this morning. -monitor BMP with repletion as appropriate   HLD Home meds: Simvastatin 20mg . No recent lipid panel in chart. Appears to be given as primary prevention. Would consider discontinuing.  Holding simvastatin for now -lipid panel outpatient   Hypertension  Home meds include amlodipine 5mg . Mildly hypertensive this morning at 142/89. -continue home amlodipine -monitor BP  GERD Home meds include pantoprazole.  -Pepcid prn  FEN/GI: heart healthy PPx: lovenox   Status is: Inpatient  Remains inpatient appropriate because:Inpatient level of care appropriate due to severity of illness   Dispo: The patient is from: SNF              Anticipated d/c is to: SNF              Patient currently is not medically stable to d/c.   Difficult to place patient No        Subjective:  No acute overnight events reported. Patient states that she is in pain with movement but otherwise denies any concerns at this time.   Objective: Temp:  [97.5 F (36.4 C)-98.9 F (37.2 C)] 98.6 F (37 C) (04/12 0639) Pulse Rate:  [73-96] 95 (04/12 0639) Resp:  [16-18] 16 (04/12 0639) BP: (90-164)/(58-89) 142/89 (04/12 0639) SpO2:  [89 %-100 %] 93 % (04/12 8676) Physical Exam: General: Patient laying comfortably in bed, in no acute distress. Cardiovascular: RRR, no murmurs or gallops auscultated  Respiratory: CTAB, no wheezing or rales noted Abdomen: soft, nontender, BS+ Extremities: left shoulder placed in sling, SCDs in place, radial and distal pulses strong and equal bilaterally Derm: skin warm and dry to touch, no new rashes or lesions noted Psych: mood appropriate   Laboratory: Recent  Labs  Lab 03/27/21 0903 03/28/21 0232 03/29/21 0142  WBC 19.6* 15.3* 11.7*  HGB 12.7 8.9* 7.8*  HCT 38.2 26.7* 23.3*  PLT  248 170 130*   Recent Labs  Lab 03/27/21 0003 03/27/21 0903 03/28/21 0232 03/29/21 0142  NA 137  --  136 136  K 2.8*  --  4.1 4.6  CL 102  --  109 107  CO2 28  --  24 26  BUN 9  --  14 13  CREATININE 0.70 0.66 0.72 0.68  CALCIUM 9.4  --  8.6* 8.7*  PROT 6.5  --   --   --   BILITOT 0.6  --   --   --   ALKPHOS 66  --   --   --   ALT 22  --   --   --   AST 28  --   --   --   GLUCOSE 150*  --  134* 102*      Imaging/Diagnostic Tests: No results found.  Savannah Dice, DO 03/29/2021, 7:01 AM PGY-1, [place] Intern pager: [phone], text pages welcome

## 2021-03-29 NOTE — TOC CAGE-AID Note (Signed)
Transition of Care [facility]) - CAGE-AID Screening   Patient Details  Name: Savannah Coleman MRN: [id number] Date of Birth: 1931-07-24  Transition of Care [facility] CM/SW Contact:    Clovis Cao, RN Phone Number: [phone] 03/29/2021, 4:16 PM   Clinical Narrative: Pt noted spider on bathroom floor, attempted to remove spider, fell over and hit head.  Pt denies alcohol use.   CAGE-AID Screening:    Have You Ever Felt You Ought to Cut Down on Your Drinking or Drug Use?: No Have People Annoyed You By Critizing Your Drinking Or Drug Use?: No Have You Felt Bad Or Guilty About Your Drinking Or Drug Use?: No Have You Ever Had a Drink or Used Drugs First Thing In The Morning to Steady Your Nerves or to Get Rid of a Hangover?: No CAGE-AID Score: 0  Substance Abuse Education Offered: No

## 2021-03-30 ENCOUNTER — Inpatient Hospital Stay (HOSPITAL_COMMUNITY): Payer: Medicare Other

## 2021-03-30 ENCOUNTER — Inpatient Hospital Stay (HOSPITAL_COMMUNITY): Payer: Medicare Other | Admitting: Anesthesiology

## 2021-03-30 ENCOUNTER — Encounter (HOSPITAL_COMMUNITY): Payer: Self-pay | Admitting: Student in an Organized Health Care Education/Training Program

## 2021-03-30 ENCOUNTER — Encounter (HOSPITAL_COMMUNITY)
Admission: EM | Disposition: A | Discharge status: Skilled Nursing Facility | Payer: Self-pay | Source: Skilled Nursing Facility | Attending: Family Medicine

## 2021-03-30 DIAGNOSIS — M810 Age-related osteoporosis without current pathological fracture: Secondary | ICD-10-CM | POA: Diagnosis not present

## 2021-03-30 DIAGNOSIS — I10 Essential (primary) hypertension: Secondary | ICD-10-CM | POA: Diagnosis not present

## 2021-03-30 DIAGNOSIS — T07XXXA Unspecified multiple injuries, initial encounter: Secondary | ICD-10-CM | POA: Diagnosis not present

## 2021-03-30 DIAGNOSIS — S42292D Other displaced fracture of upper end of left humerus, subsequent encounter for fracture with routine healing: Secondary | ICD-10-CM | POA: Diagnosis not present

## 2021-03-30 HISTORY — PX: REVERSE SHOULDER ARTHROPLASTY: SHX5054

## 2021-03-30 LAB — POCT I-STAT, CHEM 8
BUN: 10 mg/dL (ref 8–23)
Calcium, Ion: 1.17 mmol/L (ref 1.15–1.40)
Chloride: 101 mmol/L (ref 98–111)
Creatinine, Ser: 0.5 mg/dL (ref 0.44–1.00)
Glucose, Bld: 109 mg/dL — ABNORMAL HIGH (ref 70–99)
HCT: 30 % — ABNORMAL LOW (ref 36.0–46.0)
Hemoglobin: 10.2 g/dL — ABNORMAL LOW (ref 12.0–15.0)
Potassium: 4 mmol/L (ref 3.5–5.1)
Sodium: 137 mmol/L (ref 135–145)
TCO2: 27 mmol/L (ref 22–32)

## 2021-03-30 LAB — RETICULOCYTES
Immature Retic Fract: 24.5 % — ABNORMAL HIGH (ref 2.3–15.9)
RBC.: 2.25 MIL/uL — ABNORMAL LOW (ref 3.87–5.11)
Retic Count, Absolute: 63.9 10*3/uL (ref 19.0–186.0)
Retic Ct Pct: 2.8 % (ref 0.4–3.1)

## 2021-03-30 LAB — CBC
HCT: 23.5 % — ABNORMAL LOW (ref 36.0–46.0)
Hemoglobin: 7.6 g/dL — ABNORMAL LOW (ref 12.0–15.0)
MCH: 33.5 pg (ref 26.0–34.0)
MCHC: 32.3 g/dL (ref 30.0–36.0)
MCV: 103.5 fL — ABNORMAL HIGH (ref 80.0–100.0)
Platelets: 149 10*3/uL — ABNORMAL LOW (ref 150–400)
RBC: 2.27 MIL/uL — ABNORMAL LOW (ref 3.87–5.11)
RDW: 13 % (ref 11.5–15.5)
WBC: 9 10*3/uL (ref 4.0–10.5)
nRBC: 0 % (ref 0.0–0.2)

## 2021-03-30 LAB — IRON AND TIBC
Iron: 34 ug/dL (ref 28–170)
Saturation Ratios: 18 % (ref 10.4–31.8)
TIBC: 188 ug/dL — ABNORMAL LOW (ref 250–450)
UIBC: 154 ug/dL

## 2021-03-30 LAB — PREPARE RBC (CROSSMATCH)

## 2021-03-30 LAB — VITAMIN B12: Vitamin B-12: 320 pg/mL (ref 180–914)

## 2021-03-30 LAB — FERRITIN: Ferritin: 304 ng/mL (ref 11–307)

## 2021-03-30 LAB — BASIC METABOLIC PANEL
Anion gap: 6 (ref 5–15)
BUN: 9 mg/dL (ref 8–23)
CO2: 26 mmol/L (ref 22–32)
Calcium: 8.8 mg/dL — ABNORMAL LOW (ref 8.9–10.3)
Chloride: 104 mmol/L (ref 98–111)
Creatinine, Ser: 0.54 mg/dL (ref 0.44–1.00)
GFR, Estimated: >60 mL/min (ref >60)
Glucose, Bld: 94 mg/dL (ref 70–99)
Potassium: 4.3 mmol/L (ref 3.5–5.1)
Sodium: 136 mmol/L (ref 135–145)

## 2021-03-30 LAB — FOLATE: Folate: 9.3 ng/mL (ref >5.9)

## 2021-03-30 SURGERY — ARTHROPLASTY, SHOULDER, TOTAL, REVERSE
Anesthesia: Regional | Site: Shoulder | Laterality: Left

## 2021-03-30 MED ORDER — BUPIVACAINE HCL (PF) 0.5 % IJ SOLN
INTRAMUSCULAR | Status: DC | PRN
  Administered 2021-03-30: 15 mL via PERINEURAL

## 2021-03-30 MED ORDER — FENTANYL CITRATE (PF) 100 MCG/2ML IJ SOLN: 25.0000 ug | INTRAMUSCULAR | Status: DC | PRN

## 2021-03-30 MED ORDER — BUPIVACAINE LIPOSOME 1.3 % IJ SUSP
INTRAMUSCULAR | Status: DC | PRN
  Administered 2021-03-30: 10 mL via PERINEURAL

## 2021-03-30 MED ORDER — FENTANYL CITRATE (PF) 100 MCG/2ML IJ SOLN
INTRAMUSCULAR | Status: AC
  Filled 2021-03-30: qty 2

## 2021-03-30 MED ORDER — MIDAZOLAM HCL 2 MG/2ML IJ SOLN
INTRAMUSCULAR | Status: AC
  Filled 2021-03-30: qty 2

## 2021-03-30 MED ORDER — ROCURONIUM BROMIDE 10 MG/ML (PF) SYRINGE
PREFILLED_SYRINGE | INTRAVENOUS | Status: AC
  Filled 2021-03-30: qty 10

## 2021-03-30 MED ORDER — ONDANSETRON HCL 4 MG/2ML IJ SOLN: 4.0000 mg | Freq: Four times a day (QID) | INTRAMUSCULAR | Status: DC | PRN

## 2021-03-30 MED ORDER — VANCOMYCIN HCL 1000 MG IV SOLR
INTRAVENOUS | Status: AC
  Filled 2021-03-30: qty 1000

## 2021-03-30 MED ORDER — PHENOL 1.4 % MT LIQD: 1.0000 | OROMUCOSAL | Status: DC | PRN

## 2021-03-30 MED ORDER — METOCLOPRAMIDE HCL 5 MG/ML IJ SOLN: 5.0000 mg | Freq: Three times a day (TID) | INTRAMUSCULAR | Status: DC | PRN

## 2021-03-30 MED ORDER — TRANEXAMIC ACID-NACL 1000-0.7 MG/100ML-% IV SOLN
1000.0000 mg | Freq: Once | INTRAVENOUS | Status: AC
  Administered 2021-03-30: 1000 mg via INTRAVENOUS
  Filled 2021-03-30: qty 100

## 2021-03-30 MED ORDER — LACTATED RINGERS IV SOLN: INTRAVENOUS | Status: DC

## 2021-03-30 MED ORDER — ONDANSETRON HCL 4 MG/2ML IJ SOLN
INTRAMUSCULAR | Status: AC
  Filled 2021-03-30: qty 2

## 2021-03-30 MED ORDER — DOCUSATE SODIUM 100 MG PO CAPS
100.0000 mg | ORAL_CAPSULE | Freq: Two times a day (BID) | ORAL | Status: DC
  Administered 2021-03-31 – 2021-04-02 (×5): 100 mg via ORAL
  Filled 2021-03-30 (×5): qty 1

## 2021-03-30 MED ORDER — CHLORHEXIDINE GLUCONATE 0.12 % MT SOLN
15.0000 mL | OROMUCOSAL | Status: AC
  Filled 2021-03-30: qty 15

## 2021-03-30 MED ORDER — ONDANSETRON HCL 4 MG/2ML IJ SOLN
INTRAMUSCULAR | Status: DC | PRN
  Administered 2021-03-30: 4 mg via INTRAVENOUS

## 2021-03-30 MED ORDER — ALBUMIN HUMAN 5 % IV SOLN: INTRAVENOUS | Status: DC | PRN

## 2021-03-30 MED ORDER — PROPOFOL 10 MG/ML IV BOLUS
INTRAVENOUS | Status: DC | PRN
  Administered 2021-03-30: 110 mg via INTRAVENOUS

## 2021-03-30 MED ORDER — ARTIFICIAL TEARS OPHTHALMIC OINT
TOPICAL_OINTMENT | OPHTHALMIC | Status: AC
  Filled 2021-03-30: qty 3.5

## 2021-03-30 MED ORDER — TRANEXAMIC ACID-NACL 1000-0.7 MG/100ML-% IV SOLN
1000.0000 mg | INTRAVENOUS | Status: AC
  Administered 2021-03-30: 1000 mg via INTRAVENOUS
  Filled 2021-03-30: qty 100

## 2021-03-30 MED ORDER — CHLORHEXIDINE GLUCONATE 0.12 % MT SOLN
OROMUCOSAL | Status: AC
  Administered 2021-03-30: 15 mL via OROMUCOSAL
  Filled 2021-03-30: qty 15

## 2021-03-30 MED ORDER — PHENYLEPHRINE HCL-NACL 10-0.9 MG/250ML-% IV SOLN
INTRAVENOUS | Status: DC | PRN
  Administered 2021-03-30: 25 ug/min via INTRAVENOUS

## 2021-03-30 MED ORDER — ESMOLOL HCL 100 MG/10ML IV SOLN
INTRAVENOUS | Status: DC | PRN
  Administered 2021-03-30: 20 mg via INTRAVENOUS
  Administered 2021-03-30: 30 mg via INTRAVENOUS

## 2021-03-30 MED ORDER — BUPIVACAINE-EPINEPHRINE (PF) 0.25% -1:200000 IJ SOLN
INTRAMUSCULAR | Status: AC
  Filled 2021-03-30: qty 30

## 2021-03-30 MED ORDER — SODIUM CHLORIDE 0.9% IV SOLUTION: Freq: Once | INTRAVENOUS | Status: AC

## 2021-03-30 MED ORDER — CHLORHEXIDINE GLUCONATE 4 % EX LIQD
60.0000 mL | Freq: Once | CUTANEOUS | Status: AC
  Administered 2021-03-30: 4 via TOPICAL
  Filled 2021-03-30: qty 60

## 2021-03-30 MED ORDER — LIDOCAINE 2% (20 MG/ML) 5 ML SYRINGE
INTRAMUSCULAR | Status: DC | PRN
  Administered 2021-03-30: 40 mg via INTRAVENOUS

## 2021-03-30 MED ORDER — LIDOCAINE 2% (20 MG/ML) 5 ML SYRINGE
INTRAMUSCULAR | Status: AC
  Filled 2021-03-30: qty 5

## 2021-03-30 MED ORDER — CEFAZOLIN SODIUM-DEXTROSE 2-4 GM/100ML-% IV SOLN
2.0000 g | INTRAVENOUS | Status: AC
  Administered 2021-03-30: 2 g via INTRAVENOUS
  Filled 2021-03-30: qty 100

## 2021-03-30 MED ORDER — ROCURONIUM BROMIDE 10 MG/ML (PF) SYRINGE
PREFILLED_SYRINGE | INTRAVENOUS | Status: DC | PRN
  Administered 2021-03-30: 50 mg via INTRAVENOUS

## 2021-03-30 MED ORDER — CEFAZOLIN SODIUM-DEXTROSE 1-4 GM/50ML-% IV SOLN
1.0000 g | Freq: Three times a day (TID) | INTRAVENOUS | Status: AC
  Administered 2021-03-30 – 2021-03-31 (×3): 1 g via INTRAVENOUS
  Filled 2021-03-30 (×3): qty 50

## 2021-03-30 MED ORDER — OXYCODONE HCL 5 MG PO TABS
2.5000 mg | ORAL_TABLET | Freq: Four times a day (QID) | ORAL | Status: AC
  Administered 2021-03-30 – 2021-03-31 (×3): 2.5 mg via ORAL
  Filled 2021-03-30 (×3): qty 1

## 2021-03-30 MED ORDER — POVIDONE-IODINE 10 % EX SWAB: 2.0000 "application " | Freq: Once | CUTANEOUS | Status: DC

## 2021-03-30 MED ORDER — SUGAMMADEX SODIUM 200 MG/2ML IV SOLN
INTRAVENOUS | Status: DC | PRN
  Administered 2021-03-30: 200 mg via INTRAVENOUS

## 2021-03-30 MED ORDER — FENTANYL CITRATE (PF) 250 MCG/5ML IJ SOLN
INTRAMUSCULAR | Status: AC
  Filled 2021-03-30: qty 5

## 2021-03-30 MED ORDER — ONDANSETRON HCL 4 MG/2ML IJ SOLN: 4.0000 mg | Freq: Once | INTRAMUSCULAR | Status: DC | PRN

## 2021-03-30 MED ORDER — ONDANSETRON HCL 4 MG PO TABS: 4.0000 mg | ORAL_TABLET | Freq: Four times a day (QID) | ORAL | Status: DC | PRN

## 2021-03-30 MED ORDER — ESMOLOL HCL 100 MG/10ML IV SOLN
INTRAVENOUS | Status: AC
  Filled 2021-03-30: qty 10

## 2021-03-30 MED ORDER — FENTANYL CITRATE (PF) 250 MCG/5ML IJ SOLN
INTRAMUSCULAR | Status: DC | PRN
  Administered 2021-03-30: 100 ug via INTRAVENOUS

## 2021-03-30 MED ORDER — METOCLOPRAMIDE HCL 5 MG PO TABS: 5.0000 mg | ORAL_TABLET | Freq: Three times a day (TID) | ORAL | Status: DC | PRN

## 2021-03-30 MED ORDER — DEXAMETHASONE SODIUM PHOSPHATE 10 MG/ML IJ SOLN
INTRAMUSCULAR | Status: AC
  Filled 2021-03-30: qty 1

## 2021-03-30 MED ORDER — FENTANYL CITRATE (PF) 100 MCG/2ML IJ SOLN
INTRAMUSCULAR | Status: AC
  Administered 2021-03-30: 100 ug via INTRAVENOUS
  Filled 2021-03-30: qty 2

## 2021-03-30 MED ORDER — DEXAMETHASONE SODIUM PHOSPHATE 10 MG/ML IJ SOLN
INTRAMUSCULAR | Status: DC | PRN
  Administered 2021-03-30: 5 mg via INTRAVENOUS

## 2021-03-30 MED ORDER — MENTHOL 3 MG MT LOZG: 1.0000 | LOZENGE | OROMUCOSAL | Status: DC | PRN

## 2021-03-30 MED ORDER — 0.9 % SODIUM CHLORIDE (POUR BTL) OPTIME
TOPICAL | Status: DC | PRN
  Administered 2021-03-30: 1000 mL

## 2021-03-30 MED ORDER — VANCOMYCIN HCL 1 G IV SOLR
INTRAVENOUS | Status: DC | PRN
  Administered 2021-03-30: 1000 mg

## 2021-03-30 MED ORDER — FENTANYL CITRATE (PF) 100 MCG/2ML IJ SOLN: 100.0000 ug | Freq: Once | INTRAMUSCULAR | Status: AC

## 2021-03-30 SURGICAL SUPPLY — 76 items
BIT DRILL 5/64X5 DISP (BIT) ×2 IMPLANT
BIT DRILL FLUTED 3.0 STRL (BIT) ×1 IMPLANT
BLADE SAG 18X100X1.27 (BLADE) ×2 IMPLANT
COVER SURGICAL LIGHT HANDLE (MISCELLANEOUS) ×2 IMPLANT
COVER WAND RF STERILE (DRAPES) ×2 IMPLANT
CUP SUT UNIV REVERS 36 NEUTRAL (Cup) ×1 IMPLANT
DRAPE IMP U-DRAPE 54X76 (DRAPES) ×4 IMPLANT
DRAPE INCISE IOBAN 66X45 STRL (DRAPES) ×2 IMPLANT
DRAPE ORTHO SPLIT 77X108 STRL (DRAPES) ×2
DRAPE SURG 17X23 STRL (DRAPES) ×2 IMPLANT
DRAPE SURG ORHT 6 SPLT 77X108 (DRAPES) ×2 IMPLANT
DRAPE U-SHAPE 47X51 STRL (DRAPES) ×2 IMPLANT
DRESSING AQUACEL AG SP 3.5X6 (GAUZE/BANDAGES/DRESSINGS) ×1 IMPLANT
DRSG AQUACEL AG ADV 3.5X10 (GAUZE/BANDAGES/DRESSINGS) ×2 IMPLANT
DRSG AQUACEL AG SP 3.5X6 (GAUZE/BANDAGES/DRESSINGS) ×2
DURAPREP 26ML APPLICATOR (WOUND CARE) ×2 IMPLANT
ELECT BLADE 4.0 EZ CLEAN MEGAD (MISCELLANEOUS) ×2
ELECT REM PT RETURN 9FT ADLT (ELECTROSURGICAL) ×2
ELECTRODE BLDE 4.0 EZ CLN MEGD (MISCELLANEOUS) ×1 IMPLANT
ELECTRODE REM PT RTRN 9FT ADLT (ELECTROSURGICAL) ×1 IMPLANT
FACESHIELD WRAPAROUND (MASK) ×2 IMPLANT
FACESHIELD WRAPAROUND OR TEAM (MASK) ×1 IMPLANT
GLENOID UNI REV MOD 24 +2 LAT (Joint) ×1 IMPLANT
GLENOSPHERE 33+4 LAT/24 UNI RV (Joint) ×1 IMPLANT
GLOVE BIO SURGEON STRL SZ7.5 (GLOVE) ×4 IMPLANT
GLOVE SRG 8 PF TXTR STRL LF DI (GLOVE) ×2 IMPLANT
GLOVE SURG UNDER POLY LF SZ8 (GLOVE) ×2
GOWN STRL REUS W/ TWL LRG LVL3 (GOWN DISPOSABLE) ×1 IMPLANT
GOWN STRL REUS W/ TWL XL LVL3 (GOWN DISPOSABLE) ×2 IMPLANT
GOWN STRL REUS W/TWL LRG LVL3 (GOWN DISPOSABLE) ×1
GOWN STRL REUS W/TWL XL LVL3 (GOWN DISPOSABLE) ×2
INSERT HUMERAL 36 +3/33 COMBO (Joint) ×1 IMPLANT
KIT BASIN OR (CUSTOM PROCEDURE TRAY) ×2 IMPLANT
KIT TURNOVER KIT B (KITS) ×2 IMPLANT
MANIFOLD NEPTUNE II (INSTRUMENTS) ×2 IMPLANT
NDL 1/2 CIR MAYO (NEEDLE) ×1 IMPLANT
NDL HYPO 25GX1X1/2 BEV (NEEDLE) ×1 IMPLANT
NDL TAPERED W/ NITINOL LOOP (MISCELLANEOUS) IMPLANT
NEEDLE 1/2 CIR MAYO (NEEDLE) ×2 IMPLANT
NEEDLE HYPO 25GX1X1/2 BEV (NEEDLE) ×2 IMPLANT
NEEDLE TAPERED W/ NITINOL LOOP (MISCELLANEOUS) ×2 IMPLANT
NS IRRIG 1000ML POUR BTL (IV SOLUTION) ×2 IMPLANT
PACK SHOULDER (CUSTOM PROCEDURE TRAY) ×2 IMPLANT
PAD ARMBOARD 7.5X6 YLW CONV (MISCELLANEOUS) ×4 IMPLANT
PIN NITINOL TARGETER 2.8 (PIN) ×1 IMPLANT
PIN SET MODULAR GLENOID SYSTEM (PIN) ×1 IMPLANT
RESTRAINT HEAD UNIVERSAL NS (MISCELLANEOUS) ×2 IMPLANT
SCREW CENTRAL MOD 30MM (Screw) ×1 IMPLANT
SCREW PERI LOCK 5.5X16 (Screw) ×2 IMPLANT
SCREW PERI LOCK 5.5X24 (Screw) ×1 IMPLANT
SCREW PERI LOCK 5.5X32 (Screw) ×1 IMPLANT
SLING ARM IMMOBILIZER LRG (SOFTGOODS) IMPLANT
SLING ARM IMMOBILIZER MED (SOFTGOODS) IMPLANT
SPACER SHLD UNI REV 36 +6 (Shoulder) ×1 IMPLANT
SPONGE LAP 18X18 RF (DISPOSABLE) IMPLANT
SPONGE LAP 4X18 RFD (DISPOSABLE) ×2 IMPLANT
STEM HUMERAL UNIVERS SZ8 (Stem) ×1 IMPLANT
STRIP CLOSURE SKIN 1/2X4 (GAUZE/BANDAGES/DRESSINGS) ×3 IMPLANT
SUCTION FRAZIER HANDLE 10FR (MISCELLANEOUS) ×1
SUCTION TUBE FRAZIER 10FR DISP (MISCELLANEOUS) ×1 IMPLANT
SUT FIBERWIRE #2 38 T-5 BLUE (SUTURE) ×2
SUT MNCRL AB 3-0 PS2 27 (SUTURE) ×1 IMPLANT
SUT MNCRL AB 4-0 PS2 18 (SUTURE) ×2 IMPLANT
SUT VIC AB 1 CT1 27 (SUTURE)
SUT VIC AB 1 CT1 27XBRD ANBCTR (SUTURE) IMPLANT
SUT VIC AB 2-0 CT1 27 (SUTURE) ×1
SUT VIC AB 2-0 CT1 TAPERPNT 27 (SUTURE) ×1 IMPLANT
SUTURE FIBERWR #2 38 T-5 BLUE (SUTURE) ×1 IMPLANT
SUTURE TAPE 1.3 40 TPR END (SUTURE) IMPLANT
SUTURETAPE 1.3 40 TPR END (SUTURE) ×8
SYR CONTROL 10ML LL (SYRINGE) ×2 IMPLANT
TOWEL GREEN STERILE (TOWEL DISPOSABLE) ×2 IMPLANT
TOWEL GREEN STERILE FF (TOWEL DISPOSABLE) ×2 IMPLANT
TOWER CARTRIDGE SMART MIX (DISPOSABLE) IMPLANT
WATER STERILE IRR 1000ML POUR (IV SOLUTION) ×2 IMPLANT
YANKAUER SUCT BULB TIP NO VENT (SUCTIONS) ×2 IMPLANT

## 2021-03-30 NOTE — Progress Notes (Addendum)
Patient doing okay this morning.  No specific complaints.  Hemoglobin 7.6 this AM, scheduled for reverse shoulder arthroplasty later this afternoon with Dr. Stann Mainland.  Hold Lovenox and plan to transfuse 1 unit PRBCs now in preparation for surgery.  We will recheck post-transfusion H&H.    Savannah Coleman A. [facility] [phone] (office) [email or web address]

## 2021-03-30 NOTE — Progress Notes (Signed)
Family Medicine Teaching Service Daily Progress Note Intern Pager: [phone]  Patient name: Savannah Coleman Medical record number: [id number] Date of birth: 22-May-1931 Age: 87 y.o. Gender: female  Primary Care Provider: Pcp, No Consultants: orthopedic surgery Code Status: Full   Pt Overview and Major Events to Date:  4/10: Admitted   Assessment and Plan: Verdia Bolt a 87 y.o.femalepresenting with fall resulting in multiple fractures. PMH is significant forCOPD, HLD, HTN, R hip arthroplasty, lumbar fusion, bilateral wrist surgery  Fall with multipleleft-sided fractures New fractures includingL proximal humerus, impacted L basicervical femur, L distal radius and ulnaseen on x-rayafter fallin bathroom at SNF. Head and neck CT showed no acute intracranial or c-spine injury.XR left wrist noted to have significantly improved position and alignment from previously demonstrated distal radius and ulna fractures with hardware fixation of the distal radius fracture.Risk factors: H/omultiple falls and fractures-04/2020 had fall in shower resulting in R hip and wrist fractures.Currently tolerating pain well under existing pain management.Vitamin D 49.03 wnl.  Scheduled for procedure of left proximal humerus fracture today.  -ortho consulted, appreciate continued involvement and recs -encourage early mobilization -Tylenol 650 mg as needed -oxycodone IR 2.5 mg q6h prn  -dilautid 0.25 mg q6h prn for breakthrough pain  - scheduled miralax and PRN dulcolax/senna -Continue splint as advised by ortho -incentive spirometry  -PT/OT eval and treat  Normocytic Anemia Hgb 7.6,unknown of exact etiology although likely post-operative or dilutional as she is on fluids.Prior labs demonstrate macrocytic anemia. Folate and B12 workup unremarkable. No known source of bleeding noted, transfusion threshold 7. Ferritin 304.  -monitor daily CBC -transfusion threshold 7  Leukocytosis Resolved,  likely reactive in nature. -monitor CBC  HLD Home meds include simvastatin 20mg . No recent lipid panel in chart. Appears to be given as primary prevention.  Holding simvastatin for now -lipid panel outpatient  Hypertension  Mildly hypertensive 149/83. Home medsinclude amlodipine 5mg . -continue home amlodipine -monitor BP  GERD Home medsinclude pantoprazole.  -Pepcidprn  FEN/GI: NPO, then regular diet post-op PPx: lovenox    Status is: Inpatient  Remains inpatient appropriate because:Inpatient level of care appropriate due to severity of illness   Dispo: The patient is from: SNF              Anticipated d/c is to: SNF              Patient currently is not medically stable to d/c.   Difficult to place patient No        Subjective:  No acute overnight events reported. Patient denies any pain currently since she just got up. Denies chest pain and dyspnea. Awaiting procedure for humeral fracture today.  Objective: Temp:  [98.2 F (36.8 C)-99.5 F (37.5 C)] 98.5 F (36.9 C) (04/13 0505) Pulse Rate:  [82-95] 82 (04/13 0505) Resp:  [17-18] 17 (04/13 0505) BP: (128-149)/(68-83) 149/83 (04/13 0505) SpO2:  [93 %-96 %] 95 % (04/13 0505) Physical Exam: General: Patient laying comfortably in bed, in no acute distress. Cardiovascular: RRR, no murmurs or gallops auscultated Respiratory: CTAB, no wheezing, rales or rhonchi noted, breathing comfortably on 0.5L O2 Abdomen: soft, nontender, BS+ Extremities: radial and distal pulses strong and equal bilaterally, no pedal edema noted, SCDs in place bilaterally, left hip noted to be covered in clean and dry bandages  Neuro: AOx4 Psych: mood appropriate   Laboratory: Recent Labs  Lab 03/28/21 0232 03/29/21 0142 03/30/21 0200  WBC 15.3* 11.7* 9.0  HGB 8.9* 7.8* 7.6*  HCT 26.7* 23.3* 23.5*  PLT 170 130*  149*   Recent Labs  Lab 03/27/21 0003 03/27/21 0903 03/28/21 0232 03/29/21 0142 03/30/21 0200  NA 137  --   136 136 136  K 2.8*  --  4.1 4.6 4.3  CL 102  --  109 107 104  CO2 28  --  24 26 26   BUN 9  --  14 13 9   CREATININE 0.70   < > 0.72 0.68 0.54  CALCIUM 9.4  --  8.6* 8.7* 8.8*  PROT 6.5  --   --   --   --   BILITOT 0.6  --   --   --   --   ALKPHOS 66  --   --   --   --   ALT 22  --   --   --   --   AST 28  --   --   --   --   GLUCOSE 150*  --  134* 102* 94   < > = values in this interval not displayed.      Imaging/Diagnostic Tests: No results found.  Donney Dice, DO 03/30/2021, 6:42 AM PGY-1, [place] Intern pager: [phone], text pages welcome

## 2021-03-30 NOTE — Progress Notes (Signed)
FPTS Interim Progress Note  S:Visited patient to check in on her after her operation. She was laying awake in bed watching tv, appeared comfortable. Reports very little pain at this time. Some issues reaching call bell and using phone between left hand in sling and right hand with pulse ox.   O: BP (!) 149/87 (BP Location: Right Arm)   Pulse 92   Temp 98.2 F (36.8 C) (Oral)   Resp 15   Ht 5\' 2"  (1.575 m)   Wt 52.6 kg   SpO2 99%   BMI 21.22 kg/m   Gen: awake, alert, oriented, no acute distress Ext: left shoulder surgical site under ice, dressing intact without obvious blood, moving left fingers well from under wrist wrap  A/P: Doing well post-operatively. No changes to management at this time. Will continue to monitor.   Ezequiel Essex, MD 03/30/2021, 9:06 PM PGY-1, [place] Medicine Service pager [phone]

## 2021-03-30 NOTE — Brief Op Note (Signed)
03/26/2021 - 03/30/2021  5:09 PM  PATIENT:  Savannah Coleman  87 y.o. female  PRE-OPERATIVE DIAGNOSIS:  Left Shoulder fx  POST-OPERATIVE DIAGNOSIS:  Left Shoulder fx  PROCEDURE:  Procedure(s): REVERSE SHOULDER ARTHROPLASTY (Left)  SURGEON:  Surgeon(s) and Role:    * Nicholes Stairs, MD - Primary  PHYSICIAN ASSISTANT: Jonelle Sidle, PA-C  ANESTHESIA:   regional and general  EBL:  200 mL   BLOOD ADMINISTERED:none  DRAINS: none   LOCAL MEDICATIONS USED:  NONE  SPECIMEN:  No Specimen  DISPOSITION OF SPECIMEN:  N/A  COUNTS:  YES  TOURNIQUET:  * No tourniquets in log *  DICTATION: .Note written in EPIC  PLAN OF CARE: Admit to inpatient   PATIENT DISPOSITION:  PACU - hemodynamically stable.   Delay start of Pharmacological VTE agent (>24hrs) due to surgical blood loss or risk of bleeding: not applicable

## 2021-03-30 NOTE — Anesthesia Procedure Notes (Signed)
Procedure Name: Intubation Date/Time: 03/30/2021 3:33 PM Performed by: Alain Marion, CRNA Pre-anesthesia Checklist: Patient identified, Emergency Drugs available, Suction available and Patient being monitored Patient Re-evaluated:Patient Re-evaluated prior to induction Oxygen Delivery Method: Circle System Utilized Preoxygenation: Pre-oxygenation with 100% oxygen Induction Type: IV induction Ventilation: Mask ventilation without difficulty Laryngoscope Size: Miller and 2 Tube type: Oral Tube size: 7.0 mm Number of attempts: 1 Airway Equipment and Method: Stylet and Oral airway Placement Confirmation: ETT inserted through vocal cords under direct vision,  positive ETCO2 and breath sounds checked- equal and bilateral Secured at: 21 cm Tube secured with: Tape Dental Injury: Teeth and Oropharynx as per pre-operative assessment

## 2021-03-30 NOTE — Transfer of Care (Signed)
Immediate Anesthesia Transfer of Care Note  Patient: Charlottie Peragine  Procedure(s) Performed: REVERSE SHOULDER ARTHROPLASTY (Left Shoulder)  Patient Location: PACU  Anesthesia Type:General  Level of Consciousness: awake, alert  and oriented  Airway & Oxygen Therapy: Patient Spontanous Breathing and Patient connected to face mask oxygen  Post-op Assessment: Report given to RN and Post -op Vital signs reviewed and stable  Post vital signs: Reviewed and stable  Last Vitals:  Vitals Value Taken Time  BP 155/84 03/30/21 1745  Temp 36.7 C 03/30/21 1730  Pulse 88 03/30/21 1750  Resp 14 03/30/21 1750  SpO2 89 % 03/30/21 1750  Vitals shown include unvalidated device data.  Last Pain:  Vitals:   03/30/21 1730  TempSrc:   PainSc: 0-No pain      Patients Stated Pain Goal: 3 (90/22/84 0698)  Complications: No complications documented.

## 2021-03-30 NOTE — Anesthesia Preprocedure Evaluation (Signed)
Anesthesia Evaluation  Patient identified by MRN, date of birth, ID band Patient awake    Reviewed: Allergy & Precautions, NPO status , Patient's Chart, lab work & pertinent test results  Airway Mallampati: II  TM Distance: >3 FB Neck ROM: Full    Dental  (+) Teeth Intact   Pulmonary COPD,    Pulmonary exam normal        Cardiovascular hypertension, Pt. on medications and Pt. on home beta blockers  Rhythm:Regular Rate:Normal     Neuro/Psych Anxiety negative neurological ROS     GI/Hepatic negative GI ROS, Neg liver ROS,   Endo/Other  negative endocrine ROS  Renal/GU negative Renal ROS     Musculoskeletal Left shoulder fx   Abdominal (+)  Abdomen: soft. Bowel sounds: normal.  Peds  Hematology  (+) anemia ,   Anesthesia Other Findings   Reproductive/Obstetrics                             Anesthesia Physical Anesthesia Plan  ASA: II  Anesthesia Plan: General and Regional   Post-op Pain Management:  Regional for Post-op pain   Induction: Intravenous  PONV Risk Score and Plan: Ondansetron, Treatment may vary due to age or medical condition and Dexamethasone  Airway Management Planned: Mask and Oral ETT  Additional Equipment: None  Intra-op Plan:   Post-operative Plan: Extubation in OR  Informed Consent: I have reviewed the patients History and Physical, chart, labs and discussed the procedure including the risks, benefits and alternatives for the proposed anesthesia with the patient or authorized representative who has indicated his/her understanding and acceptance.     Dental advisory given  Plan Discussed with: CRNA  Anesthesia Plan Comments: (Lab Results      Component                Value               Date                      WBC                      9.0                 03/30/2021                HGB                      7.6 (L)             03/30/2021                 HCT                      23.5 (L)            03/30/2021                MCV                      103.5 (H)           03/30/2021                PLT                      149 (L)  03/30/2021           Lab Results      Component                Value               Date                      NA                       136                 03/30/2021                K                        4.3                 03/30/2021                CO2                      26                  03/30/2021                GLUCOSE                  94                  03/30/2021                BUN                      9                   03/30/2021                CREATININE               0.54                03/30/2021                CALCIUM                  8.8 (L)             03/30/2021                GFRNONAA                 >60                 03/30/2021          )        Anesthesia Quick Evaluation

## 2021-03-30 NOTE — Anesthesia Procedure Notes (Signed)
Anesthesia Regional Block: Interscalene brachial plexus block   Pre-Anesthetic Checklist: ,, timeout performed, Correct Patient, Correct Site, Correct Laterality, Correct Procedure, Correct Position, site marked, Risks and benefits discussed,  Surgical consent,  Pre-op evaluation,  At surgeon's request and post-op pain management  Laterality: Left  Prep: Dura Prep       Needles:  Injection technique: Single-shot  Needle Type: Echogenic Stimulator Needle     Needle Length: 5cm  Needle Gauge: 20     Additional Needles:   Procedures:,,,, ultrasound used (permanent image in chart),,,,  Narrative:  Start time: 03/30/2021 2:52 PM End time: 03/30/2021 2:57 PM Injection made incrementally with aspirations every 5 mL.  Performed by: Personally  Anesthesiologist: Darral Dash, DO  Additional Notes: Patient identified. Risks/Benefits/Options discussed with patient including but not limited to bleeding, infection, nerve damage, failed block, incomplete pain control. Patient expressed understanding and wished to proceed. All questions were answered. Sterile technique was used throughout the entire procedure. Please see nursing notes for vital signs. Aspirated in 5cc intervals with injection for negative confirmation. Patient was given instructions on fall risk and not to get out of bed. All questions and concerns addressed with instructions to call with any issues or inadequate analgesia.

## 2021-03-30 NOTE — Op Note (Signed)
Date: 03/30/2021   PRE-OPERATIVE DIAGNOSIS:  Left comminuted, 3 part proximal humerus fracture   POST-OPERATIVE DIAGNOSIS:  Same   PROCEDURE:  1. REVERSE SHOULDER ARTHROPLASTY   SURGEON:  Nicholes Stairs, MD   ASSISTANT: Jonelle Sidle, PA-C  Attestation: PA Mcclung was scrubbed and present for the entire procedure.  Utilized for prepping and positioning of patient.  Utilized for approach to the shoulder and deep retractor placement as well as trialing and implanting final implants.  Also utilized for complex layered closure.   ANESTHESIA:   General with a block   ESTIMATED BLOOD LOSS: See anesthesia record   PREOPERATIVE INDICATIONS: Savannah Coleman is a right-hand-dominant 87 year old female who sustained a left proximal humerus fracture following a fall.  Due to the comminution and displaced nature of the fracture and head splitting components we discussed operative management.  We discussed moving forward with reverse shoulder arthroplasty for his injury to allow earlier weight bearing on a walker and/or cane as needed and lower risk of AVN, non union or progression of shoulder arthritis following the fracture. Thus we elected to proceed with reverse shoulder arthroplasty for the plan.  The risks benefits and alternatives were discussed with the patient preoperatively including but not limited to the risks of infection, bleeding, nerve injury, cardiopulmonary complications, the need for revision surgery, dislocation, brachial plexus palsy, incomplete relief of pain, among others, and the patient was willing to proceed. The patient did provided informed consent.   OPERATIVE IMPLANTS:  Arthrex size 8 longstem. 24+2 mm lateralized baseplate 33+4 mm lateralized glenosphere +6 mm proximal body build up on the humerus 36+3 polyethylene liner   OPERATIVE FINDINGS:   Comminuted proximal humerus fracture of the surgical neck with involvement of the greater tuberosity as well as lesser  tuberosity through the biceps groove.  Rotator cuff tendons were in fairly good condition.  There was no pre-existing arthritis noted.  The long head of the biceps tendon was incarcerated in the tubercular groove fracture.  Bone quality was poor as expected.  There was some comminution noted along the medial calcar.  However, no propagation of those distal.   OPERATIVE PROCEDURE: The patient was brought to the operating room and placed in the supine position. General anesthesia was administered. IV antibiotics were given. Time out was performed. The upper extremity was prepped and draped in usual sterile fashion. The patient was in a beachchair position. Deltopectoral approach was carried out. After dissection through skin and subcutaneous fat, the cephalic vein was identified with the deltopectoral interval.  This was mobilized and taken laterally.   The fracture was identified and working through the fracture in the biceps groove the lesser and greater tuberosities were freed up and tagged with #2 Fiber wire sutures.  The humeral head was fragmented and removed from the wound.       I then performed circumferential releases of the humerus.  I then moved to sizing the humerus.  There was minimal calcar bone loss and this was used to reference the height of the stem, as was the upper border of the pectoralis major tendon.  The canal was reamed and found to fit best with a 8 mm fracture stem.   We next turned to the glenoid.  Deep retractors were placed, and I resected the labrum as well as the residual long head of biceps, and then placed a guidepin into the center position on the glenoid, with slight inferior declination. I then reamed over the guidepin, and this created a  small metaphyseal cancellus blush inferiorly, removing just the cartilage to the subchondral bone superiorly. The base plate was selected and impacted place, and then I secured it centrally with a nonlocking screw, and I had excellent  purchase both inferiorly and superiorly. I placed a short locking screws on anterior aspect,  And posterior aspect.   I then turned my attention to the glenosphere, and impacted this into place and applying the locking set screw.   The glenosphere was completely seated, and had engagement of the [facility] taper. I then turned my attention back to the humerus.    The 8 mm stem was seated to the appropriate height to allow approximate 5.6 cm from the top of the Pectoralis major tendon to the top of the glenosphere.  The stem was placed in 30 degrees of retroversion.    We did also place some humeral head autograft into the metaphysis of the humerus prior to impacting the stem.  Once the stem was impacted into place we trialed poly liners.  Using the standard humeral metaglen,  The shoulder had excellent motion, and was stable.  The final poly was impacted and again showed good motion and stability.  Next, I irrigated the wounds copiously.    The greater tuberosity was brought back to the humeral stem suture holes and secured with bone graft from the humeral head as augment.  The lesser tuberosity and subscapularis tendon were also brought back to the anterior aspect of the proximal humerus with permanent sutures. The axillary nerve was palpated at the end of implanting, and found to be in continuity and not under undo tension.   I then irrigated the shoulder copiously once more, repaired the deltopectoral interval with Vicryl followed by subcutaneous monocryl and then subcuticular monocryl with Steri-Strips and sterile gauze for the skin.  1 g of vancomycin powder placed into the deltopectoral interval prior to closure.  The patient was awakened and returned back in stable and satisfactory condition. There no complications and he tolerated the procedure well.  All counts were correct.  The patient awakened from general anesthesia with no complications and transferred to PACU in stable condition.    Postoperative Plan: Savannah Coleman will remain in his sling until her regional block has worn off, but is ok to remove it for use with her walker.  She should not lift over 5 pounds.  The sling will be worn for sleep for 6 weeks.we will allow active motion only with the arm at the side in order to use the walker.  Otherwise no active motion through the shoulder for the first 6 weeks.  She may move the elbow as able.  Okay for platform weightbearing.  She may resume chemical DVT prophylaxis immediately.  We will follow up on her postoperative hemoglobin given perioperative anemia and acute loss blood loss during today's procedure.

## 2021-03-30 NOTE — Consult Note (Signed)
ORTHOPAEDIC CONSULTATION  REQUESTING PHYSICIAN: McDiarmid, Blane Ohara, MD  PCP:  Pcp, No  Chief Complaint: Left proximal humerus fracture  HPI: Savannah Coleman is a 87 y.o. female who complains of left shoulder pain.  She was admitted to the hospital on April 10 following a fall late that previous evening in the bathroom.  She was attempting to squash a spider.  She landed on her left side.  She unfortunately sustained left hip fracture, left distal radius fracture, and left proximal humerus fracture.  She was treated initially with fixation of the hip and wrist by Dr. Lennette Bihari Haddix in the orthopedic trauma service.  However, given the nature of the displaced proximal humerus fracture and the need for early weightbearing for walker usage and platform weightbearing, I have been consulted back to perform a reverse shoulder arthroplasty for management of the shoulder.  At this time she describes moderate to severe shoulder pain that is worse with movement.  She is right-hand dominant.  [facility] due to recovery from other compression fractures and right hip fracture.  She was independent with ADLs prior to this fall.  Denies smoking.  Denies diabetes.  Does have osteoporosis.  Past Medical History:  Diagnosis Date  . Anemia   . Anxiety   . Constipation   . COPD (chronic obstructive pulmonary disease) ([place])   . Femur fracture, right ([place]) 2015  . Heartburn   . High cholesterol   . History of right radius fracture 04/2020  . History of vertebral fracture   . Hypertension   . Insomnia   . Left Radius fracture 04/2014  . Recurrent falls 03/27/2021  . Resides in long term care facility 03/27/2021   Past Surgical History:  Procedure Laterality Date  . HIP ARTHROPLASTY Right   . INTRAMEDULLARY (IM) NAIL INTERTROCHANTERIC Left 03/27/2021   Procedure: INTRAMEDULLARY (IM) NAIL INTERTROCHANTRIC;  Surgeon: Shona Needles, MD;  Location: [facility];  Service: Orthopedics;   Laterality: Left;  . LUMBAR FUSION    . ORIF WRIST FRACTURE Left 03/27/2021   Procedure: OPEN REDUCTION INTERNAL FIXATION (ORIF) WRIST FRACTURE;  Surgeon: Shona Needles, MD;  Location: [facility];  Service: Orthopedics;  Laterality: Left;  [facility] VERTEBROPLASTY     Lumbar vertebra  . WRIST SURGERY Bilateral    Social History   Socioeconomic History  . Marital status: Divorced    Spouse name: Not on file  . Number of children: Not on file  . Years of education: Not on file  . Highest education level: Not on file  Occupational History  . Not on file  Tobacco Use  . Smoking status: Never Smoker  . Smokeless tobacco: Never Used  Vaping Use  . Vaping Use: Never used  Substance and Sexual Activity  . Alcohol use: Not Currently  . Drug use: Not Currently  . Sexual activity: Not on file  Other Topics Concern  . Not on file  [place] resident for long term care   Social Determinants of Health   Financial Resource Strain: Not on file  Food Insecurity: Not on file  Transportation Needs: Not on file  Physical Activity: Not on file  Stress: Not on file  Social Connections: Not on file   History reviewed. No pertinent family history. Allergies  Allergen Reactions  . Morphine And Related Nausea Only   Prior to Admission medications   Medication Sig Start Date End Date Taking? Authorizing Provider  acetaminophen (TYLENOL) 325 MG tablet Take 325 mg by mouth every 4 (four) hours as needed for moderate pain.   Yes [provider]  albuterol (PROVENTIL) (2.5 MG/3ML) 0.083% nebulizer solution Take 2.5 mg by nebulization every 4 (four) hours as needed for wheezing or shortness of breath.   Yes [provider]  alum & mag hydroxide-simeth (MAALOX PLUS) 400-400-40 MG/5ML suspension Take 30 mLs by mouth as directed. Give 30 ml once a week on Mondays & Give 30 mls by mouth every 6 hours PRN for Indigestion, Heartburn, Gas, Nausea   Yes [provider]  amLODipine (NORVASC) 5 MG tablet Take 1 tablet by mouth daily. 03/24/21  Yes [provider]  bisacodyl (DULCOLAX) 5 MG EC tablet Take 5 mg by mouth daily as needed for moderate constipation.   Yes [provider]  calcitonin, salmon, (MIACALCIN/FORTICAL) 200 UNIT/ACT nasal spray Place 1 spray into alternate nostrils daily.   Yes [provider]  calcium carbonate (TUMS - DOSED IN MG ELEMENTAL CALCIUM) 500 MG chewable tablet Chew 1 tablet by mouth every 8 (eight) hours as needed for indigestion.   Yes [provider]  calcium-vitamin D (OSCAL WITH D) 500-200 MG-UNIT tablet Take 1 tablet by mouth.   Yes [provider]  docusate sodium (COLACE) 100 MG capsule Take 100 mg by mouth 2 (two) times daily.   Yes [provider]  ferrous sulfate 325 (65 FE) MG EC tablet Take 325 mg by mouth daily with breakfast.   Yes [provider]  HYDROcodone-acetaminophen (NORCO) 10-325 MG tablet Take 1 tablet by mouth at bedtime. 02/18/21  Yes [provider]  HYDROcodone-acetaminophen (NORCO/VICODIN) 5-325 MG tablet Take 1 tablet by mouth in the morning.   Yes [provider]  Lido-PE-Glycerin-Petrolatum (PREPARATION H RAPID RELIEF) 5-0.25-14.4-15 % CREA Apply 1 application topically every 8 (eight) hours as needed (hemorrhoids).   Yes [provider]  melatonin 3 MG TABS tablet Take 3 mg by mouth at bedtime as needed (sleep).   Yes [provider]  meloxicam (MOBIC) 7.5 MG tablet Take 7.5 mg by mouth daily. 03/15/21  Yes [provider]  metoprolol succinate (TOPROL-XL) 25 MG 24 hr tablet Take 12.5 mg by mouth daily. 03/14/21  Yes [provider]  Multiple Vitamin (MULTIVITAMIN ADULT) TABS Take 1 tablet by mouth daily.   Yes [provider]  nitroGLYCERIN (NITROSTAT) 0.4 MG SL tablet Place 0.4 mg under the tongue every 5 (five) minutes as needed for chest pain.   Yes [provider]  ondansetron (ZOFRAN) 4 MG tablet Take 4 mg by mouth daily as needed for nausea or vomiting.   Yes [provider]  pantoprazole (PROTONIX) 40 MG tablet Take 1 tablet by mouth daily. 03/25/21  Yes [provider]  polyethylene glycol (MIRALAX / GLYCOLAX) 17 g packet Take 17 g by mouth daily.   Yes [provider]  simvastatin (ZOCOR) 20 MG tablet Take 20 mg by mouth at bedtime.   Yes [provider]  tiZANidine (ZANAFLEX) 2 MG tablet Take 2 mg by mouth every 8 (eight) hours as needed for muscle spasms. 02/19/21  Yes [provider]  vitamin C (ASCORBIC ACID) 500 MG tablet Take 500 mg by mouth daily.   Yes [provider]   No results found.  Positive ROS: All other systems have been reviewed and were otherwise negative with the exception of those mentioned in the HPI and as above.  Physical Exam: General: Alert,  no acute distress Cardiovascular: No pedal edema Respiratory: No cyanosis, no use of accessory musculature GI: No organomegaly, abdomen is soft and non-tender Skin: No lesions in the area of chief complaint Neurologic: Sensation intact distally Psychiatric: Patient is competent for consent with normal mood and affect Lymphatic: No axillary or cervical lymphadenopathy  MUSCULOSKELETAL:  Left upper extremity demonstrates moderate swelling along the shoulder and axilla.  Some bruising in the axilla as well consistent with injury.  Tender to palpation there.  Distally along the wrist she has a volar splint.  Fingers are mild to moderately swollen.  Motor is intact and sensory is intact throughout.  This includes the axillary nerve.  Assessment: Left comminuted proximal humerus fracture, closed.  Plan: -After conversation with the patient and her daughters regarding the nature of this injury I recommendation is to move ahead with reverse arthroplasty.  This will allow immediate weightbearing on a walker and earlier  motion and immediate reduction in pain.  Due to the polytrauma nature this is in her best interest.  -We discussed the risk of bleeding, infection, damage to surrounding nerves and vessels, dislocation, periprosthetic fracture, stiffness, and the need for further surgery.  We also reviewed and discussed the risk of anesthesia and peripheral nerve block.  -All questions were solicited and answered to her and her daughter satisfaction.  Informed consent was obtained.  -We will monitor postoperative hemoglobin very closely as she is starting out a little bit low given the multitrauma and recent surgeries.  She did receive 1 unit of packed red blood cells this a.m.    Nicholes Stairs, MD Cell [phone]    03/30/2021 2:49 PM

## 2021-03-31 ENCOUNTER — Encounter (HOSPITAL_COMMUNITY): Payer: Self-pay | Admitting: Orthopedic Surgery

## 2021-03-31 LAB — CBC
HCT: 25.5 % — ABNORMAL LOW (ref 36.0–46.0)
Hemoglobin: 8.7 g/dL — ABNORMAL LOW (ref 12.0–15.0)
MCH: 34.1 pg — ABNORMAL HIGH (ref 26.0–34.0)
MCHC: 34.1 g/dL (ref 30.0–36.0)
MCV: 100 fL (ref 80.0–100.0)
Platelets: 183 10*3/uL (ref 150–400)
RBC: 2.55 MIL/uL — ABNORMAL LOW (ref 3.87–5.11)
RDW: 13 % (ref 11.5–15.5)
WBC: 11.8 10*3/uL — ABNORMAL HIGH (ref 4.0–10.5)
nRBC: 0 % (ref 0.0–0.2)

## 2021-03-31 LAB — COMPREHENSIVE METABOLIC PANEL
ALT: 19 U/L (ref 0–44)
AST: 32 U/L (ref 15–41)
Albumin: 2.8 g/dL — ABNORMAL LOW (ref 3.5–5.0)
Alkaline Phosphatase: 42 U/L (ref 38–126)
Anion gap: 7 (ref 5–15)
BUN: 17 mg/dL (ref 8–23)
CO2: 27 mmol/L (ref 22–32)
Calcium: 8.8 mg/dL — ABNORMAL LOW (ref 8.9–10.3)
Chloride: 101 mmol/L (ref 98–111)
Creatinine, Ser: 0.8 mg/dL (ref 0.44–1.00)
GFR, Estimated: >60 mL/min (ref >60)
Glucose, Bld: 153 mg/dL — ABNORMAL HIGH (ref 70–99)
Potassium: 4.1 mmol/L (ref 3.5–5.1)
Sodium: 135 mmol/L (ref 135–145)
Total Bilirubin: 0.9 mg/dL (ref 0.3–1.2)
Total Protein: 5 g/dL — ABNORMAL LOW (ref 6.5–8.1)

## 2021-03-31 LAB — TYPE AND SCREEN
ABO/RH(D): O POS
Antibody Screen: NEGATIVE
Unit division: 0

## 2021-03-31 LAB — BPAM RBC
Blood Product Expiration Date: 202205102359
ISSUE DATE / TIME: 202204131153
Unit Type and Rh: 5100

## 2021-03-31 MED ORDER — KETOROLAC TROMETHAMINE 15 MG/ML IJ SOLN
15.0000 mg | Freq: Four times a day (QID) | INTRAMUSCULAR | Status: AC
  Administered 2021-03-31 – 2021-04-01 (×4): 15 mg via INTRAVENOUS
  Filled 2021-03-31 (×4): qty 1

## 2021-03-31 MED ORDER — KETOROLAC TROMETHAMINE 15 MG/ML IJ SOLN: 15.0000 mg | Freq: Four times a day (QID) | INTRAMUSCULAR | Status: DC

## 2021-03-31 MED ORDER — HYDROMORPHONE HCL 1 MG/ML IJ SOLN
0.5000 mg | INTRAMUSCULAR | Status: DC | PRN
  Administered 2021-03-31 – 2021-04-02 (×5): 0.5 mg via INTRAVENOUS
  Filled 2021-03-31 (×5): qty 0.5

## 2021-03-31 MED ORDER — OXYCODONE HCL 5 MG PO TABS
2.5000 mg | ORAL_TABLET | Freq: Four times a day (QID) | ORAL | Status: AC
  Administered 2021-03-31 – 2021-04-01 (×3): 2.5 mg via ORAL
  Filled 2021-03-31 (×3): qty 1

## 2021-03-31 MED ORDER — ENOXAPARIN SODIUM 40 MG/0.4ML ~~LOC~~ SOLN
40.0000 mg | SUBCUTANEOUS | Status: DC
  Administered 2021-03-31 – 2021-04-02 (×3): 40 mg via SUBCUTANEOUS
  Filled 2021-03-31 (×3): qty 0.4

## 2021-03-31 MED ORDER — OXYCODONE HCL 5 MG PO TABS: 2.5000 mg | ORAL_TABLET | Freq: Four times a day (QID) | ORAL | Status: DC

## 2021-03-31 NOTE — Plan of Care (Signed)

## 2021-03-31 NOTE — Progress Notes (Signed)
Bedside shift report received. Received patient awake, alert/orientedx4 and able to verbalize needs. NAD noted; respirations easy/even on 2L O2 via nasal cannula. Dressing to LUE and LLE c/d/i; sensation intact. Sling noted to L shoulder; patient able to wiggle fingers. SCDs on to bilateral lower extremities. Whiteboard updated. All safety measures in place and personal belongings within reach.

## 2021-03-31 NOTE — Evaluation (Addendum)
Physical Therapy Evaluation Patient Details Name: Savannah Coleman MRN: [id number] DOB: 1931-06-11 Today's Date: 03/31/2021   History of Present Illness  Collins Dimaria is an 87 y.o. female who was sustained a  left hip fracture, L distal radius fx and L proximal humerus fracture after a mechanical fall in the bathroom trying to kill a spider. Pt underwent ORIF to L hip and L wrist however is planned for L reverse shld surgery on wednesday 4/13. PSH: bilat THA, bilat TSA, R IM nail to femur fx 04/2020 PMH: COPD, HTN, anemia  Clinical Impression  Pt participated in re evaluation following L TSA. Pt with increased anxiety regarding pain increase with movement. Pt continues to be limited during evaluation by pain, weakness, coordination and balance. Pt will benefit form skilled PT ot address deficits to maximize independence with functional mobility prior ot discharge.  Continue with plans from previous evaluation  Follow Up Recommendations SNF;Supervision/Assistance - 24 hour    Equipment Recommendations  Other (comment) (defer ot next venue)    Recommendations for Other Services       Precautions / Restrictions Precautions Precautions: Fall Required Braces or Orthoses: Sling;Splint/Cast Splint/Cast: LUE in sling Restrictions Weight Bearing Restrictions: Yes LUE Weight Bearing: Non weight bearing LLE Weight Bearing: Weight bearing as tolerated Other Position/Activity Restrictions: can weight bear through elbow with platform RW per OP note      Mobility  Bed Mobility Overal bed mobility: Needs Assistance Bed Mobility: Supine to Sit;Sit to Supine Rolling: Max assist   Supine to sit: Mod assist;+2 for physical assistance Sit to supine: Mod assist;+2 for physical assistance   General bed mobility comments: supine>sit pt able to manage LEs requiring assist for trunk due to L UE pain. pt able toinitiate movement of B LEs and assist with use of bedrail for sit>supine     Transfers Overall transfer level: Needs assistance Equipment used: 2 person hand held assist Transfers: Sit to/from [facility] Sit to Stand: Mod assist;+2 physical assistance Stand pivot transfers: Mod assist;+2 physical assistance          Ambulation/Gait                Stairs            Wheelchair Mobility    Modified Rankin (Stroke Patients Only)       Balance Overall balance assessment: Needs assistance Sitting-balance support: Feet supported;Single extremity supported Sitting balance-Leahy Scale: Fair     Standing balance support: Single extremity supported;During functional activity Standing balance-Leahy Scale: Poor Standing balance comment: mod x 2 to stand                             Pertinent Vitals/Pain Pain Assessment: 0-10 Pain Score: 8  Faces Pain Scale: Hurts whole lot Pain Location: LUE Pain Descriptors / Indicators: Crying;Discomfort;Grimacing;Guarding Pain Intervention(s): Limited activity within patient's tolerance;Monitored during session    [place] expects to be discharged to:: Skilled nursing facility                 Additional Comments: [facility]    Prior Function Level of Independence: Needs assistance   Gait / Transfers Assistance Needed: Ambulated with rollator  ADL's / Homemaking Assistance Needed: Required assistance for bathing  Comments: Pt reported being independent with all ADLs wtih exception of bathing     Hand Dominance   Dominant Hand: Right    Extremity/Trunk Assessment   Upper Extremity Assessment Upper  Extremity Assessment: Defer to OT evaluation RUE Deficits / Details: pt reports decreased fucntion of R hand at baseline, with complaints of decreased strength and thumb ROM. LUE Deficits / Details: Pt extremely limited bu L shoulder pain this session LUE Sensation:  (Pt reports thingling in L hand)    Lower Extremity Assessment Lower  Extremity Assessment: Generalized weakness RLE Deficits / Details: generalized weakness LLE Deficits / Details: decreased active movement due to pain    Cervical / Trunk Assessment Cervical / Trunk Assessment: Kyphotic  Communication   Communication: No difficulties  Cognition Arousal/Alertness: Awake/alert Behavior During Therapy: Anxious Overall Cognitive Status: Within Functional Limits for tasks assessed                                 General Comments: anxious regarding pain increase with movement      General Comments General comments (skin integrity, edema, etc.): Pt with bruising on LUE, L distal arm in ace wrap, LUE supported in sling, bandages on L shoulder.    Exercises General Exercises - Upper Extremity Elbow Flexion: AROM;Self ROM Elbow Extension: AROM;Self ROM Digit Composite Flexion: AROM;Self ROM Composite Extension: AROM;Self ROM   Assessment/Plan    PT Assessment Patient needs continued PT services  PT Problem List Decreased strength;Decreased range of motion;Decreased activity tolerance;Decreased balance;Decreased mobility;Decreased coordination;Decreased cognition;Decreased knowledge of use of DME;Pain       PT Treatment Interventions DME instruction;Gait training;Stair training;Functional mobility training;Therapeutic activities;Balance training    PT Goals (Current goals can be found in the Care Plan section)  Acute Rehab PT Goals Patient Stated Goal: to gain indep PT Goal Formulation: With patient Time For Goal Achievement: 04/14/21 Potential to Achieve Goals: Good    Frequency Min 3X/week   Barriers to discharge        Co-evaluation               AM-PAC PT "6 Clicks" Mobility  Outcome Measure Help needed turning from your back to your side while in a flat bed without using bedrails?: A Lot Help needed moving from lying on your back to sitting on the side of a flat bed without using bedrails?: A Lot Help needed moving  to and from a bed to a chair (including a wheelchair)?: A Lot Help needed standing up from a chair using your arms (e.g., wheelchair or bedside chair)?: A Lot Help needed to walk in hospital room?: Total Help needed climbing 3-5 steps with a railing? : Total 6 Click Score: 10    End of Session Equipment Utilized During Treatment: Gait belt Activity Tolerance: Patient tolerated treatment well;Patient limited by pain Patient left: in bed;with call bell/phone within reach;with bed alarm set;with nursing/sitter in room;with family/visitor present;with SCD's reapplied Nurse Communication: Mobility status PT Visit Diagnosis: Unsteadiness on feet (R26.81);Muscle weakness (generalized) (M62.81);Pain;Difficulty in walking, not elsewhere classified (R26.2) Pain - Right/Left: Left Pain - part of body: Shoulder    Time: 7342-8768 PT Time Calculation (min) (ACUTE ONLY): 26 min   Charges:   PT Evaluation $PT Re-evaluation: 1 Re-eval PT Treatments $Therapeutic Activity: 8-22 mins        Lyanne Co, DPT Acute Rehabilitation Services [phone]  [place] 03/31/2021, 2:26 PM

## 2021-03-31 NOTE — NC FL2 (Signed)
[place] LEVEL OF CARE SCREENING TOOL     IDENTIFICATION  Patient Name: Savannah Coleman Birthdate: 1930/12/19 Sex: female Admission Date (Current Location): 03/26/2021  [facility] and [place] Number:  Herbalist and Address:  [facility]. [facility], [place] [address] [place], [place] [address]      Provider Number: [id number]  Attending Physician Name and Address:  McDiarmid, Blane Ohara, MD  Relative Name and Phone Number:       Current Level of Care: Hospital Recommended Level of Care: [place] Prior Approval Number:    Date Approved/Denied:   PASRR Number: [id number]  Discharge Plan: SNF    Current Diagnoses: Patient Active Problem List   Diagnosis Date Noted  . Postoperative anemia due to acute blood loss 03/29/2021  . Multiple fractures 03/27/2021  . Hypokalemia 03/27/2021  . Recurrent falls 03/27/2021  . Closed fracture of neck of left femur ([place]) 03/27/2021  . Closed fracture of left distal radius and ulna, initial encounter 03/27/2021  . Left humeral neck fracture 03/27/2021  . Essential hypertension 03/27/2021  . Hyperlipidemia 03/27/2021  . Resides in long term care facility 03/27/2021  . Osteoporosis 03/27/2021  . Closed fracture of anatomical neck of left humerus   . Fall at nursing home     Orientation RESPIRATION BLADDER Height & Weight     Self,Time,Situation,Place  O2 Continent Weight: 52.6 kg Height:  5\' 2"  (157.5 cm)  BEHAVIORAL SYMPTOMS/MOOD NEUROLOGICAL BOWEL NUTRITION STATUS      Continent Diet (refer to d/c summary)  AMBULATORY STATUS COMMUNICATION OF NEEDS Skin   Extensive Assist Verbally Surgical wounds (4/10 s/p L  femur fracture , and -ORIF Ll intra-articular distal radius fracture,REVERSE SHOULDER ARTHROPLASTY, 4/13)                       Personal Care Assistance Level of Assistance  Bathing,Feeding,Dressing Bathing Assistance: Maximum assistance Feeding assistance: Limited  assistance Dressing Assistance: Maximum assistance     Functional Limitations Info  Sight,Hearing,Speech Sight Info: Adequate Hearing Info: Adequate Speech Info: Adequate    SPECIAL CARE FACTORS FREQUENCY  PT (By licensed PT),OT (By licensed OT)     PT Frequency: 5x/ week, evaluate and treat OT Frequency: 5x/week, evaluate and treat            Contractures Contractures Info: Not present    Additional Factors Info  Code Status,Allergies Code Status Info: full code Allergies Info: Morphine and related           Current Medications (03/31/2021):  This is the current hospital active medication list Current Facility-Administered Medications  Medication Dose Route Frequency Provider Last Rate Last Admin  . acetaminophen (TYLENOL) tablet 650 mg  650 mg Oral Q6H Nicholes Stairs, MD   650 mg at 03/31/21 0900  . amLODipine (NORVASC) tablet 5 mg  5 mg Oral Daily Nicholes Stairs, MD   5 mg at 03/31/21 0900  . bisacodyl (DULCOLAX) EC tablet 5 mg  5 mg Oral Daily PRN Nicholes Stairs, MD      . ceFAZolin (ANCEF) IVPB 1 g/50 mL premix  1 g Intravenous Q8H Nicholes Stairs, MD 100 mL/hr at 03/31/21 0536 1 g at 03/31/21 0536  . docusate sodium (COLACE) capsule 100 mg  100 mg Oral BID Nicholes Stairs, MD   100 mg at 03/31/21 0900  . enoxaparin (LOVENOX) injection 40 mg  40 mg Subcutaneous Q24H Ganta, Anupa, DO   40 mg  at 03/31/21 0900  . HYDROmorphone (DILAUDID) injection 0.25 mg  0.25 mg Intravenous Q6H PRN Nicholes Stairs, MD   0.25 mg at 03/31/21 1059  . lactated ringers infusion   Intravenous Continuous Nicholes Stairs, MD 10 mL/hr at 03/30/21 1509 Restarted at 03/30/21 1523  . oxyCODONE (Oxy IR/ROXICODONE) immediate release tablet 2.5 mg  2.5 mg Oral Q6H Nicholes Stairs, MD   2.5 mg at 03/31/21 0900  . oxyCODONE (Oxy IR/ROXICODONE) immediate release tablet 2.5 mg  2.5 mg Oral Q6H McDiarmid, Blane Ohara, MD      . polyethylene glycol (MIRALAX /  GLYCOLAX) packet 17 g  17 g Oral BID Nicholes Stairs, MD   17 g at 03/31/21 0900  . senna (SENOKOT) tablet 8.6 mg  1 tablet Oral Daily Nicholes Stairs, MD   8.6 mg at 03/31/21 0900  . tiZANidine (ZANAFLEX) tablet 2 mg  2 mg Oral Q8H PRN Nicholes Stairs, MD   2 mg at 03/30/21 1946     Discharge Medications: Please see discharge summary for a list of discharge medications.  Relevant Imaging Results:  Relevant Lab Results:   Additional Information SS# [id number]  Sharin Mons, RN

## 2021-03-31 NOTE — Anesthesia Postprocedure Evaluation (Signed)
Anesthesia Post Note  Patient: Savannah Coleman  Procedure(s) Performed: REVERSE SHOULDER ARTHROPLASTY (Left Shoulder)     Patient location during evaluation: PACU Anesthesia Type: Regional and General Level of consciousness: awake and alert Pain management: pain level controlled Vital Signs Assessment: post-procedure vital signs reviewed and stable Respiratory status: spontaneous breathing, nonlabored ventilation, respiratory function stable and patient connected to nasal cannula oxygen Cardiovascular status: blood pressure returned to baseline and stable Postop Assessment: no apparent nausea or vomiting Anesthetic complications: no   No complications documented.  Last Vitals:  Vitals:   03/31/21 0001 03/31/21 0351  BP: 101/61 (!) 97/55  Pulse: 74 71  Resp: 16 16  Temp: (!) 36.4 C 36.8 C  SpO2: 98% 100%    Last Pain:  Vitals:   03/31/21 0351  TempSrc: Oral  PainSc:                  March Rummage Braelynne Garinger

## 2021-03-31 NOTE — Progress Notes (Addendum)
Family Medicine Teaching Service Daily Progress Note Intern Pager: [phone]  Patient name: Savannah Coleman Medical record number: [id number] Date of birth: 1931/10/19 Age: 87 y.o. Gender: female  Primary Care Provider: Pcp, No Consultants: orthopedic surgery  Code Status: Full   Pt Overview and Major Events to Date:  4/10: Admitted   Assessment and Plan: Savannah Coleman a 87 y.o.femalepresenting with fall resulting in multiple fractures. PMH is significant forCOPD, HLD, HTN, R hip arthroplasty, lumbar fusion, bilateral wrist surgery  Fall with multipleleft-sided fractures New fractures includingL proximal humerus, impacted L basicervical femur, L distal radius and ulnaseen on x-rayafter fallin bathroom at SNF. Head and neck CT showed no acute intracranial or c-spine injury.XR left wrist noted to have significantly improved position and alignment from previously demonstrated distal radius and ulna fractures with hardware fixation of the distal radius fracture.Risk factors: H/omultiple falls and fractures-04/2020 had fall in shower resulting in R hip and wrist fractures.Currently tolerating pain well under existing pain management.Vitamin D 49.03 wnl. Reverse left shoulder arthroplasty performed 4/13. Endorsing left shoulder pain this morning, but did not have any pain overnight following surgery.  -ortho consulted, appreciate continued involvement and recommendations include remaining in sling until regional block no longer takes effect, no lifting more than 5 pounds, continue wearing sling for 6 weeks, platform weightbearing okay.  -encourage early mobilization -Tylenol 650 mg as needed -oxycodone IR 2.5 mg q8h prn -dilautid 0.25 mg q6h prn for breakthrough pain - scheduled miralax and PRN dulcolax/senna -Continue splint as advised by ortho -incentive spirometry  -PT/OT eval and treat: Recommendation includes SNF   Normocytic Anemia Hgb8.7,unknown of exact etiology  althoughlikely post-operative or dilutional as she is on fluids.Prior labs demonstrate macrocytic anemia. Folate and B12 workup unremarkable.No known source of bleeding noted, transfusion threshold 7. Ferritin 304. Patient received 1 unit pRBC prior to surgery yesterday. -monitor daily CBC -transfusion threshold 7  Leukocytosis WBC 11.8, likely reactive in nature. -monitor CBC  HLD Home meds include simvastatin 20mg . No recent lipid panel in chart. Appears to be given as primary prevention.  Holding simvastatin for now -lipid panel outpatient  Hypertension  Mildly hypotensive this morninig, BP 97/55. Home medsinclude amlodipine 5mg . -continue home amlodipine -monitor BP  GERD Home medsinclude pantoprazole.  -Pepcidprn  FEN/GI: regular  PPx: lovenox    Status is: Inpatient    Dispo: The patient is from: SNF              Anticipated d/c is to: SNF              Patient currently is not medically stable to d/c.   Difficult to place patient No        Subjective:  No acute overnight events reported. Patient states that she was not in pain since after the surgery until this morning. She is due for her oxycodone. Denies any other concerns. Reports that she just had breakfast and is looking forward to lunch.   Objective: Temp:  [97.5 F (36.4 C)-98.7 F (37.1 C)] 98.2 F (36.8 C) (04/14 0351) Pulse Rate:  [71-102] 71 (04/14 0351) Resp:  [14-22] 16 (04/14 0351) BP: (97-179)/(55-95) 97/55 (04/14 0351) SpO2:  [92 %-100 %] 100 % (04/14 0351) Weight:  [52.6 kg] 52.6 kg (04/13 1439) Physical Exam: General: Patient sitting upright in bed, in no acute distress. Cardiovascular: RRR, no murmurs auscultated  Respiratory: CTAB, no wheezing noted, breathing comfortably on 3L O2 Abdomen: soft, nontender, BS+ Extremities: sling in place for left humeral fracture, SCDs in place,  radial and distal pulses strong and equal bilaterally, clean and dry bandages noted along  left hip Psych: mood appropriate, very pleasant, smiling during the entire encounter   Laboratory: Recent Labs  Lab 03/29/21 0142 03/30/21 0200 03/30/21 1519 03/31/21 0342  WBC 11.7* 9.0  --  11.8*  HGB 7.8* 7.6* 10.2* 8.7*  HCT 23.3* 23.5* 30.0* 25.5*  PLT 130* 149*  --  183   Recent Labs  Lab 03/27/21 0003 03/27/21 0903 03/29/21 0142 03/30/21 0200 03/30/21 1519 03/31/21 0342  NA 137   < > 136 136 137 135  K 2.8*   < > 4.6 4.3 4.0 4.1  CL 102   < > 107 104 101 101  CO2 28   < > 26 26  --  27  BUN 9   < > 13 9 10 17   CREATININE 0.70   < > 0.68 0.54 0.50 0.80  CALCIUM 9.4   < > 8.7* 8.8*  --  8.8*  PROT 6.5  --   --   --   --  5.0*  BILITOT 0.6  --   --   --   --  0.9  ALKPHOS 66  --   --   --   --  42  ALT 22  --   --   --   --  19  AST 28  --   --   --   --  32  GLUCOSE 150*   < > 102* 94 109* 153*   < > = values in this interval not displayed.      Imaging/Diagnostic Tests: DG Shoulder Left Port  Result Date: 03/30/2021 CLINICAL DATA:  Status post left shoulder arthroplasty. EXAM: LEFT SHOULDER COMPARISON:  None. FINDINGS: The left glenoid and humeral components appear to be well situated. Expected postoperative changes are seen in the surrounding soft tissues. IMPRESSION: Status post left total shoulder arthroplasty. Electronically Signed   By: Marijo Conception M.D.   On: 03/30/2021 17:44    Donney Dice, DO 03/31/2021, 6:35 AM PGY-1, [place] Intern pager: [phone], text pages welcome

## 2021-03-31 NOTE — Progress Notes (Signed)
Subjective: Patient is an 87 year old female 1 day s/p right reverse total shoulder arthroplasty for fracture by Dr. Stann Mainland, 4 days s/p left IM femur nail and ORIF of left distal radius by Dr. Doreatha Martin. Patient was evaluated by PT and OT noting severe pain of the left shoulder , decreased function, and SNF recommendation upon discharge.  Patient reports pain as moderate to severe.  She rates her pain as an 8/10 today. Denies numbness or tingling, reports this has resolved as her block wore off. She denies fever or chills. Her primary concern is her left shoulder pain.   Objective:   VITALS:   Vitals:   03/30/21 1932 03/31/21 0001 03/31/21 0351 03/31/21 0811  BP: (!) 149/87 101/61 (!) 97/55 128/69  Pulse: 92 74 71 83  Resp: 15 16 16 18   Temp: 98.2 F (36.8 C) (!) 97.5 F (36.4 C) 98.2 F (36.8 C) 98.6 F (37 C)  TempSrc: Oral Oral Oral Oral  SpO2: 99% 98% 100% 94%  Weight:      Height:        Left Upper Extremity:   INSPECTION & PALPATION: Aquacell bandage present on the left anterior shoulder, clean dry, intact. Resolving ecchymosis of the upper arm. Volar splint present on the left wrist- intact.    SENSORY: sensation is intact to light touch in:  superficial radial nerve distribution (dorsal first web space) median nerve distribution (tip of index finger)   ulnar nerve distribution (tip of small finger)    Axillary nerve distribution (lateral shoulder)   ROM: painless passive range of motion of the elbow and wrist.   MOTOR:  + motor posterior interosseous nerve (thumb IP extension) + anterior interosseous nerve (thumb IP flexion, index finger DIP flexion) + median nerve (palpable firing thenar mass) + ulnar nerve (palpable firing of first dorsal interosseous muscle)  Able to make a composite fist.    VASCULAR: brisk capillary refill < 2 sec, fingers warm and well-perfused     Lab Results  Component Value Date   WBC 11.8 (H) 03/31/2021   HGB 8.7 (L) 03/31/2021    HCT 25.5 (L) 03/31/2021   MCV 100.0 03/31/2021   PLT 183 03/31/2021   BMET    Component Value Date/Time   NA 135 03/31/2021 0342   K 4.1 03/31/2021 0342   CL 101 03/31/2021 0342   CO2 27 03/31/2021 0342   GLUCOSE 153 (H) 03/31/2021 0342   BUN 17 03/31/2021 0342   CREATININE 0.80 03/31/2021 0342   CALCIUM 8.8 (L) 03/31/2021 0342   GFRNONAA >60 03/31/2021 0342     Assessment/Plan: 1 Day Post-Op   Principal Problem:   Multiple fractures Active Problems:   Hypokalemia   Recurrent falls   Closed fracture of neck of left femur (HCC)   Closed fracture of left distal radius and ulna, initial encounter   Left humeral neck fracture   Essential hypertension   Hyperlipidemia   Resides in long term care facility   Osteoporosis   Closed fracture of anatomical neck of left humerus   Fall at nursing home   Postoperative anemia due to acute blood loss   Advance diet Up with therapy Discharge to SNF per primary Hemoglobin currently stable at 8.7 Weightbearing Status: She should not lift over 5 pounds.  The sling will be worn for sleep for 6 weeks.we will allow active motion only with the arm at the side in order to use the walker.  Otherwise no active motion through the  shoulder for the first 6 weeks.  She may move the elbow as able.  Okay for platform weightbearing.  Pain control : Orders placed for 4 doses of toradol and increase in her dilaudid per epic orders to help with breakthrough pain as her block wore off.   Will plan to print pain medication Rx tomorrow after re-evaluation of symptoms.   DVT Prophylaxis: per primary vs. Orthotrauma   She will [facility] in 2 weeks with Jonelle Sidle PA-C or Dr. Stann Mainland for wound check and xrays.   Faythe Casa 03/31/2021, 3:04 PM  Jonelle Sidle PA-C  Physician Assistant with Dr. Lillia Abed [place]

## 2021-03-31 NOTE — Progress Notes (Signed)
Occupational Therapy Treatment Patient Details Name: Savannah Coleman MRN: [id number] DOB: 10-Dec-1931 Today's Date: 03/31/2021    History of present illness Savannah Coleman is an 87 y.o. female who was sustained a  left hip fracture, L distal radius fx and L proximal humerus fracture after a mechanical fall in the bathroom trying to kill a spider. Pt underwent ORIF to L hip and L wrist however is planned for L reverse shld surgery on wednesday 4/13. PSH: bilat THA, bilat TSA, R IM nail to femur fx 04/2020 PMH: COPD, HTN, anemia   OT comments  Pt was resting in bed with daughter present and complained of 10/10 pain in L shoulder with any movement. Pt limited to bed level education and LUE exercises and positioning this session. Pt education handouts were given and pt and daughter were education on LUE restrictions and exercises. Pt tolerated exercises well as long as L shoulder was supported with pillows. Pt also educated on the importance of getting OOB frequently. OT to continue to follow acutely to progress in ADLs and functional mobility. D/c plan remains appropriate.    Follow Up Recommendations  SNF    Equipment Recommendations  3 in 1 bedside commode;Tub/shower bench       Precautions / Restrictions Precautions Precautions: Fall Required Braces or Orthoses: Sling;Splint/Cast Splint/Cast: LUE in sling Restrictions Weight Bearing Restrictions: Yes LUE Weight Bearing: Non weight bearing LLE Weight Bearing: Weight bearing as tolerated Other Position/Activity Restrictions: can weight bear through elbow with platform RW per OP note       Mobility Bed Mobility Overal bed mobility: Needs Assistance Bed Mobility: Supine to Sit;Sit to Supine Rolling: Max assist   Supine to sit: Mod assist;+2 for physical assistance Sit to supine: Mod assist;+2 for physical assistance   General bed mobility comments: supine>sit pt able to manage LEs requiring assist for trunk due to L UE pain. pt able  toinitiate movement of B LEs and assist with use of bedrail for sit>supine    Transfers Overall transfer level: Needs assistance Equipment used: 2 person hand held assist Transfers: Sit to/from [facility] Sit to Stand: Mod assist;+2 physical assistance Stand pivot transfers: Mod assist;+2 physical assistance            Balance Overall balance assessment: Needs assistance Sitting-balance support: Feet supported;Single extremity supported Sitting balance-Leahy Scale: Fair     Standing balance support: Single extremity supported;During functional activity Standing balance-Leahy Scale: Poor Standing balance comment: mod x 2 to stand           ADL either performed or assessed with clinical judgement   ADL Overall ADL's : Needs assistance/impaired Eating/Feeding: Set up;Bed level   Grooming: Wash/dry hands;Wash/dry face;Oral care;Brushing hair;Set up;Bed level   Upper Body Bathing: Moderate assistance;Adhering to UE precautions;Bed level   Lower Body Bathing: Maximal assistance;+2 for physical assistance;Bed level   Upper Body Dressing : Adhering to UE precautions;Bed level;Maximal assistance   Lower Body Dressing: Maximal assistance;+2 for physical assistance;Bed level   Toilet Transfer: Maximal assistance;+2 for physical assistance   Toileting- Clothing Manipulation and Hygiene: Maximal assistance;+2 for physical assistance;Sit to/from stand       Functional mobility during ADLs:  (Pt limited by L shoulder pain this session, refused to comlpeted bed mobility to transfers.) General ADL Comments: ADLs completed at bed level at this time due to pain in L shoulder with any movement               Cognition Arousal/Alertness: Awake/alert Behavior During Therapy:  Anxious Overall Cognitive Status: Within Functional Limits for tasks assessed                                 General Comments: anxious regarding pain increase with  movement        Exercises Exercises: General Upper Extremity ([place]) General Exercises - Upper Extremity Elbow Flexion: AROM;Self ROM Elbow Extension: AROM;Self ROM Digit Composite Flexion: AROM;Self ROM Composite Extension: AROM;Self ROM   Shoulder Instructions       General Comments Pt with bruising on LUE, L distal arm in ace wrap, LUE supported in sling, bandages on L shoulder.    Pertinent Vitals/ Pain       Pain Assessment: 0-10 Pain Score: 8  Faces Pain Scale: Hurts whole lot Pain Location: LUE Pain Descriptors / Indicators: Crying;Discomfort;Grimacing;Guarding Pain Intervention(s): Limited activity within patient's tolerance;Monitored during session  [place] expects to be discharged to:: Skilled nursing facility             Additional Comments: [facility]      Prior Functioning/Environment Level of Independence: Needs assistance  Gait / Transfers Assistance Needed: Ambulated with rollator ADL's / Homemaking Assistance Needed: Required assistance for bathing   Comments: Pt reported being independent with all ADLs wtih exception of bathing   Frequency  Min 2X/week        Progress Toward Goals  OT Goals(current goals can now be found in the care plan section)  Progress towards OT goals: Progressing toward goals  Acute Rehab OT Goals Patient Stated Goal: to gain indep OT Goal Formulation: With patient Time For Goal Achievement: 04/11/21 Potential to Achieve Goals: Good ADL Goals Pt Will Perform Upper Body Bathing: with set-up;sitting Pt Will Perform Lower Body Bathing: with adaptive equipment;sit to/from stand;with min assist Pt Will Perform Upper Body Dressing: with set-up;sitting Pt Will Perform Lower Body Dressing: with min guard assist;with adaptive equipment;sit to/from stand Pt Will Transfer to Toilet: with min assist;bedside commode Pt Will Perform Toileting - Clothing Manipulation and hygiene: with min guard assist;sit  to/from stand Pt Will Perform Tub/Shower Transfer: with min assist;tub bench  Plan Discharge plan remains appropriate       AM-PAC OT "6 Clicks" Daily Activity     Outcome Measure   Help from another person eating meals?: A Little Help from another person taking care of personal grooming?: A Little Help from another person toileting, which includes using toliet, bedpan, or urinal?: A Lot Help from another person bathing (including washing, rinsing, drying)?: A Lot Help from another person to put on and taking off regular upper body clothing?: A Lot Help from another person to put on and taking off regular lower body clothing?: A Lot 6 Click Score: 14    End of Session    OT Visit Diagnosis: Unsteadiness on feet (R26.81);Other abnormalities of gait and mobility (R26.89);Repeated falls (R29.6);Muscle weakness (generalized) (M62.81);History of falling (Z91.81);Pain Pain - Right/Left: Left Pain - part of body: Shoulder   Activity Tolerance Patient limited by pain   Patient Left in bed;with call bell/phone within reach;with bed alarm set;with family/visitor present   Nurse Communication  (Pt requesting enema for BM, pt daughter requested to talk wtih case manager)        Time: 8119-1478 OT Time Calculation (min): 32 min  Charges: OT General Charges $OT Visit: 1 Visit OT Treatments $Therapeutic Exercise: 23-37 mins     Bethzaida Boord A Arnetra Terris 03/31/2021, 2:22 PM

## 2021-04-01 LAB — BASIC METABOLIC PANEL
Anion gap: 7 (ref 5–15)
BUN: 13 mg/dL (ref 8–23)
CO2: 28 mmol/L (ref 22–32)
Calcium: 8.9 mg/dL (ref 8.9–10.3)
Chloride: 101 mmol/L (ref 98–111)
Creatinine, Ser: 0.63 mg/dL (ref 0.44–1.00)
GFR, Estimated: >60 mL/min (ref >60)
Glucose, Bld: 104 mg/dL — ABNORMAL HIGH (ref 70–99)
Potassium: 3.6 mmol/L (ref 3.5–5.1)
Sodium: 136 mmol/L (ref 135–145)

## 2021-04-01 LAB — RESP PANEL BY RT-PCR (FLU A&B, COVID) ARPGX2
Influenza A by PCR: NEGATIVE
Influenza B by PCR: NEGATIVE
SARS Coronavirus 2 by RT PCR: NEGATIVE

## 2021-04-01 LAB — CBC
HCT: 26.1 % — ABNORMAL LOW (ref 36.0–46.0)
Hemoglobin: 8.9 g/dL — ABNORMAL LOW (ref 12.0–15.0)
MCH: 34.2 pg — ABNORMAL HIGH (ref 26.0–34.0)
MCHC: 34.1 g/dL (ref 30.0–36.0)
MCV: 100.4 fL — ABNORMAL HIGH (ref 80.0–100.0)
Platelets: 214 10*3/uL (ref 150–400)
RBC: 2.6 MIL/uL — ABNORMAL LOW (ref 3.87–5.11)
RDW: 13.2 % (ref 11.5–15.5)
WBC: 9.3 10*3/uL (ref 4.0–10.5)
nRBC: 0 % (ref 0.0–0.2)

## 2021-04-01 MED ORDER — ENOXAPARIN SODIUM 40 MG/0.4ML ~~LOC~~ SOLN: 40.0000 mg | SUBCUTANEOUS | 0 refills | Status: DC

## 2021-04-01 MED ORDER — OXYCODONE HCL 5 MG PO TABS: 2.5000 mg | ORAL_TABLET | Freq: Four times a day (QID) | ORAL | 0 refills | Status: DC | PRN

## 2021-04-01 NOTE — Discharge Instructions (Addendum)
Orthopaedic Trauma Service Discharge Instructions   General Discharge Instructions  WEIGHT BEARING STATUS: Left leg -  Weightbearing as tolerated Left arm - weightbearing as tolerated through left elbow with platform walker. Do not weightbear through left wrist  RANGE OF MOTION/ACTIVITY: Left leg - Ok for unrestricted hip and knee motion.  Left arm - Wear sling while sleeping x 6 weeks. Avoid any active range of motion at the shoulder. Ok for active elbow motion with the arm at the side of the body  Wound Care: Left leg: Ok to leave incisions open to air.  You may shower and get his incisions wet.  Do not scrub incisions. Left arm: Leave Aquacel dressing in place over shoulder incision until follow-up.  Do not remove splint from left wrist.  Do not get splint wet.  DVT/PE prophylaxis: Lovenox x28 days  Diet: as you were eating previously.  Can use over the counter stool softeners and bowel preparations, such as Miralax, to help with bowel movements.  Narcotics can be constipating.  Be sure to drink plenty of fluids  PAIN MEDICATION USE AND EXPECTATIONS  You have likely been given narcotic medications to help control your pain.  After a traumatic event that results in an fracture (broken bone) with or without surgery, it is ok to use narcotic pain medications to help control one's pain.  We understand that everyone responds to pain differently and each individual patient will be evaluated on a regular basis for the continued need for narcotic medications. Ideally, narcotic medication use should last no more than 6-8 weeks (coinciding with fracture healing).   As a patient it is your responsibility as well to monitor narcotic medication use and report the amount and frequency you use these medications when you come to your office visit.   We would also advise that if you are using narcotic medications, you should take a dose prior to therapy to maximize you participation.  IF YOU ARE ON  NARCOTIC MEDICATIONS IT IS NOT PERMISSIBLE TO OPERATE A MOTOR VEHICLE (MOTORCYCLE/CAR/TRUCK/MOPED) OR HEAVY MACHINERY DO NOT MIX NARCOTICS WITH OTHER CNS (CENTRAL NERVOUS SYSTEM) DEPRESSANTS SUCH AS ALCOHOL   STOP SMOKING OR USING NICOTINE PRODUCTS!!!!  As discussed nicotine severely impairs your body's ability to heal surgical and traumatic wounds but also impairs bone healing.  Wounds and bone heal by forming microscopic blood vessels (angiogenesis) and nicotine is a vasoconstrictor (essentially, shrinks blood vessels).  Therefore, if vasoconstriction occurs to these microscopic blood vessels they essentially disappear and are unable to deliver necessary nutrients to the healing tissue.  This is one modifiable factor that you can do to dramatically increase your chances of healing your injury.    (This means no smoking, no nicotine gum, patches, etc)  DO NOT USE NONSTEROIDAL ANTI-INFLAMMATORY DRUGS (NSAID'S)  Using products such as Advil (ibuprofen), Aleve (naproxen), Motrin (ibuprofen) for additional pain control during fracture healing can delay and/or prevent the healing response.  If you would like to take over the counter (OTC) medication, Tylenol (acetaminophen) is ok.  However, some narcotic medications that are given for pain control contain acetaminophen as well. Therefore, you should not exceed more than 4000 mg of tylenol in a day if you do not have liver disease.  Also note that there are may OTC medicines, such as cold medicines and allergy medicines that my contain tylenol as well.  If you have any questions about medications and/or interactions please ask your doctor/PA or your pharmacist.  ICE AND ELEVATE INJURED/OPERATIVE EXTREMITY  Using ice and elevating the injured extremity above your heart can help with swelling and pain control.  Icing in a pulsatile fashion, such as 20 minutes on and 20 minutes off, can be followed.    Do not place ice directly on skin. Make sure there is  a barrier between to skin and the ice pack.    Using frozen items such as frozen peas works well as the conform nicely to the are that needs to be iced.  USE AN ACE WRAP OR TED HOSE FOR SWELLING CONTROL  In addition to icing and elevation, Ace wraps or TED hose are used to help limit and resolve swelling.  It is recommended to use Ace wraps or TED hose until you are informed to stop.    When using Ace Wraps start the wrapping distally (farthest away from the body) and wrap proximally (closer to the body)   Example: If you had surgery on your leg or thing and you do not have a splint on, start the ace wrap at the toes and work your way up to the thigh        If you had surgery on your upper extremity and do not have a splint on, start the ace wrap at your fingers and work your way up to the upper arm  IF YOU ARE IN A SPLINT OR CAST DO NOT [place]   If your splint gets wet for any reason please contact the office immediately. You may shower in your splint or cast as long as you keep it dry.  This can be done by wrapping in a cast cover or garbage back (or similar)  Do Not stick any thing down your splint or cast such as pencils, money, or hangers to try and scratch yourself with.  If you feel itchy take benadryl as prescribed on the bottle for itching   CALL THE OFFICE WITH ANY QUESTIONS OR CONCERNS: [phone]   VISIT OUR WEBSITE FOR ADDITIONAL INFORMATION: [email or web address]   LEFT SHOULDER REVERSE REPLACEMENT INSTRUCTIONS:   - Maintain your dressing until your first post operative visit in 2 weeks. This dressing is waterproof and you can shower with it.   - Motion : No lifting over 5 pounds with the left arm.  You should wear the sling at all times except for showering and activities of daily living. She can take the sling off multiple times a day to work on elbow range of motion and activities of daily living. The sling should remain on at night while asleep. She can use  the arm with active range of motion when it is at her side in order to use the walker with a platform on the walker ( platform weightbearing)  . She will do no motion through the shoulder for the first 6 weeks.  - Take pain medication as needed as provided by the Orthopedic Trauma Service.   - Call with any concerns at [facility] [phone]. You will need to make an appointment for 2 weeks from your surgery for a wound check and repeat xrays at [facility] with [facility] PA-C or Dr. Stann Mainland.

## 2021-04-01 NOTE — Progress Notes (Signed)
Bedside shift report completed. Received patient awake,alert/orientedx4 and able to verbalize needs. NAD noted; respirations easy/even on room air. Dressing to L shoulder c/d/i; sling in place; sensation intact and able to wiggle fingers. Dressing to L hip c/d/i. Generalized bruising to LUE noted. SCDs on to bilateral lower extremities. Whiteboard updated. All safety measures in place and personal belongings within reach.

## 2021-04-01 NOTE — Progress Notes (Signed)
Subjective: Patient is an 87 year old female 2 days s/p right reverse total shoulder arthroplasty for fracture by Dr. Stann Mainland, 5 days s/p left IM femur nail and ORIF of left distal radius by Dr. Doreatha Martin.  Today, she reports improved pain of the left shoulder, no complaints. Denies numbness or tingling.  Objective:   VITALS:   Vitals:   03/31/21 1923 04/01/21 0406 04/01/21 0820 04/01/21 1105  BP: (!) 141/89 (!) 152/88 126/82   Pulse: 87 92 86   Resp: 16 16 16    Temp: 98.4 F (36.9 C) 97.9 F (36.6 C) 98.8 F (37.1 C)   TempSrc: Oral Oral Oral   SpO2: 99% 99% 93% 94%  Weight:      Height:        Left Upper Extremity:   INSPECTION & PALPATION: Aquacell bandage present on the left anterior shoulder, clean dry, intact. Resolving ecchymosis of the upper arm. Volar splint present on the left wrist- intact.    SENSORY: sensation is intact to light touch in:  superficial radial nerve distribution (dorsal first web space) median nerve distribution (tip of index finger)   ulnar nerve distribution (tip of small finger)    Axillary nerve distribution (lateral shoulder)   ROM: painless passive and active range of motion of the elbow, wrist in a splint   MOTOR:  + motor posterior interosseous nerve (thumb IP extension) + anterior interosseous nerve (thumb IP flexion, index finger DIP flexion) + median nerve (palpable firing thenar mass) + ulnar nerve (palpable firing of first dorsal interosseous muscle)  Able to make a composite fist.    VASCULAR: brisk capillary refill < 2 sec, fingers warm and well-perfused     Lab Results  Component Value Date   WBC 9.3 04/01/2021   HGB 8.9 (L) 04/01/2021   HCT 26.1 (L) 04/01/2021   MCV 100.4 (H) 04/01/2021   PLT 214 04/01/2021   BMET    Component Value Date/Time   NA 136 04/01/2021 0216   K 3.6 04/01/2021 0216   CL 101 04/01/2021 0216   CO2 28 04/01/2021 0216   GLUCOSE 104 (H) 04/01/2021 0216   BUN 13 04/01/2021 0216    CREATININE 0.63 04/01/2021 0216   CALCIUM 8.9 04/01/2021 0216   GFRNONAA >60 04/01/2021 0216     Assessment/Plan: 2 Days Post-Op   Principal Problem:   Multiple fractures Active Problems:   Hypokalemia   Recurrent falls   Closed fracture of neck of left femur (HCC)   Closed fracture of left distal radius and ulna, initial encounter   Left humeral neck fracture   Essential hypertension   Hyperlipidemia   Resides in long term care facility   Osteoporosis   Closed fracture of anatomical neck of left humerus   Fall at nursing home   Postoperative anemia due to acute blood loss   Advance diet Up with therapy Discharge to SNF per primary  Weightbearing Status: She should not lift over 5 pounds.  The sling will be worn for sleep for 6 weeks.we will allow active motion only with the arm at the side in order to use the walker.  Otherwise no active motion through the shoulder for the first 6 weeks.  She may move the elbow as able.  Okay for platform weightbearing.   Pain medication rx printed via [place] for discharge.   DVT Prophylaxis: lovenox outpatient per Orthotrauma   She will follow up at [facility] in 2 weeks with Jonelle Sidle PA-C or Dr. Stann Mainland  for wound check and xrays.   Faythe Casa 04/01/2021, 12:06 PM  Jonelle Sidle PA-C  Physician Assistant with Dr. Lillia Abed [place]

## 2021-04-01 NOTE — Progress Notes (Signed)
FPTS Interim Progress Note  Notified by nurse that patient's daughter is requesting call regarding not wanting to discharge patient to SNF today as planned. Called and spoke with one of patient's daughters, Santiago Glad, who stated that patient was in a lot of pain last night and that she does not maintain capacity although she seems like she does and appears to be happy. I explained to Santiago Glad that I have been seeing her daily over the past week and her pain has been well managed, she appears to be in well spirits daily. I assured Santiago Glad that we have been monitoring her pain closely and hemoglobin level to ensure she remains stable after her procedures. Santiago Glad responds by stating "you all don't have a concept of her and she does not have capacity although she seems happy." Went to bedside to update patient, she seems uneasy about discharging today but defers to her children to make a decision. She appears sad regarding her decreased functional ability since this surgery but I assured her that it will take time for her to gradually. She agreed. Patient continues to maintain capacity. Son, Remo Lipps, also at bedside. He is in favor of patient staying another day for continued monitoring. Discussed with attending, Dr. Gwendlyn Deutscher. We will hold off on discharge to SNF today, will plan for anticipated discharge tomorrow.     Donney Dice, DO 04/01/2021, 12:44 PM PGY-1, [place] [facility] pager [phone]

## 2021-04-01 NOTE — Discharge Summary (Deleted)
[facility] Discharge Summary  Patient name: Savannah Coleman Medical record number: [id number] Date of birth: 08-03-1931 Age: 87 y.o. Gender: female Date of Admission: 03/26/2021  Date of Discharge: 04/01/2021 Admitting Physician: Richarda Osmond, DO  Primary Care Provider: Pcp, No Consultants: orthopedic surgery  Indication for Hospitalization: fall resulting in multiple fractures   Discharge Diagnoses/Problem List:  Fall with multiple eft-sided fractures Normocytic anemia Hypokalemia  Leukocytosis  HLD HTN GERD  Disposition: SNF  Discharge Condition: medically stable   Discharge Exam:  Temp:  [97.9 F (36.6 C)-98.6 F (37 C)] 97.9 F (36.6 C) (04/15 0406) Pulse Rate:  [83-92] 92 (04/15 0406) Resp:  [16-18] 16 (04/15 0406) BP: (128-152)/(69-89) 152/88 (04/15 0406) SpO2:  [94 %-99 %] 99 % (04/15 0406) Physical Exam: General: Patient sitting upright in bed after finishing breakfast, in no acute distress. Cardiovascular: RRR, no murmurs or gallops auscultated  Respiratory: CTAB, no wheezing, rales or rhonchi  Abdomen: soft, nontender, BS+ Extremities: left sling in place, radial and distal pulses strong and equal bilaterally, SCDs in place Psych: mood appropriate, very pleasant   Brief Hospital Course:  Savannah Coleman is a 87 y.o. female presenting with fall resulting in multiple fractures. PMH is significant for COPD, HLD, HTN, R hip arthroplasty, lumbar fusion, bilateral wrist surgery.   Fall with multiple fx Ms. Heavin presented to the ED via EMS after sustaining several injuries after a fall in the bathroom of her SNF. Patient reports a mechanical fall, tried to step on a spider and tripped. No LOC, no head trauma. Vital signs stable. CT head demonstrated no acute intracranial abnormality. CT C-spine negative for acute fracture or static subluxation. CT left hip demonstrated acute comminuted fracture of proximal left femur. XR left shoulder  showed acute mildly comminuted displaced angulated fracture of left humeral neck. XR left wrist demonstrated acute comminuted displaced angulated distal radius and ulnar fractures. Multiple fractures noted including L proximal humerus, impacted L basicervical femur, L distal radius and ulna. Patient received the following procedures without complications: cephalomedullary nailing of the left hip and left reverse shoulder arthroplasty.  Hypokalemia Potassium 2.8 on admission, improved after PO supplementation. Patient required repeated potassium supplementation during hospitalization. Potassium on day of discharge normalized to 3.6. Recommend recheck with PCP follow up in one week.   Normocytic anemia Noted to have this throughout hospitalization, Hgb 7.6 prior to reverse left shoulder arthroplasty which she received 1 unit pRBC with improvement. On discharge Hgb 8.9.   All other issues chronic and stable.     Issues for Follow Up:  1. Recheck BMP, specifically potassium as patient required aggressive K repletion.  2. Recommend patient be started on bisphosphonates as outpatient.  3. Recommend discussing if patient needs to continue Metoprolol as outpatient 4. Ensure patient follows up with orthopedics outpatient in 2 weeks for repeat imaging and wound check of left hip and left wrist with Dr. Stann Mainland.  5. Repeat CBC, given persistent anemia.     Significant Procedures:  1. Cephalomedullary nailing of the left hip 2. Left shoulder reverse arthoplasty   Significant Labs and Imaging:  Recent Labs  Lab 03/30/21 0200 03/30/21 1519 03/31/21 0342 04/01/21 0216  WBC 9.0  --  11.8* 9.3  HGB 7.6* 10.2* 8.7* 8.9*  HCT 23.5* 30.0* 25.5* 26.1*  PLT 149*  --  183 214   Recent Labs  Lab 03/27/21 0003 03/27/21 0903 03/28/21 0232 03/29/21 0142 03/30/21 0200 03/30/21 1519 03/31/21 0342 04/01/21 0216  NA 137  --  136 136 136 137 135 136  K 2.8*  --  4.1 4.6 4.3 4.0 4.1 3.6  CL 102  --   109 107 104 101 101 101  CO2 28  --  24 26 26   --  27 28  GLUCOSE 150*  --  134* 102* 94 109* 153* 104*  BUN 9  --  14 13 9 10 17 13   CREATININE 0.70   < > 0.72 0.68 0.54 0.50 0.80 0.63  CALCIUM 9.4  --  8.6* 8.7* 8.8*  --  8.8* 8.9  MG 1.8  --   --   --   --   --   --   --   ALKPHOS 66  --   --   --   --   --  42  --   AST 28  --   --   --   --   --  32  --   ALT 22  --   --   --   --   --  19  --   ALBUMIN 3.9  --   --   --   --   --  2.8*  --    < > = values in this interval not displayed.      Results/Tests Pending at Time of Discharge:  none  Discharge Medications:  Allergies as of 04/01/2021      Reactions   Morphine And Related Nausea Only      Medication List    STOP taking these medications   meloxicam 7.5 MG tablet Commonly known as: MOBIC     TAKE these medications   acetaminophen 325 MG tablet Commonly known as: TYLENOL Take 325 mg by mouth every 4 (four) hours as needed for moderate pain.   albuterol (2.5 MG/3ML) 0.083% nebulizer solution Commonly known as: PROVENTIL Take 2.5 mg by nebulization every 4 (four) hours as needed for wheezing or shortness of breath.   alum & mag hydroxide-simeth 195-093-26 MG/5ML suspension Commonly known as: MAALOX PLUS Take 30 mLs by mouth as directed. Give 30 ml once a week on Mondays & Give 30 mls by mouth every 6 hours PRN for Indigestion, Heartburn, Gas, Nausea   amLODipine 5 MG tablet Commonly known as: NORVASC Take 1 tablet by mouth daily.   bisacodyl 5 MG EC tablet Commonly known as: DULCOLAX Take 5 mg by mouth daily as needed for moderate constipation.   calcitonin (salmon) 200 UNIT/ACT nasal spray Commonly known as: MIACALCIN/FORTICAL Place 1 spray into alternate nostrils daily.   calcium carbonate 500 MG chewable tablet Commonly known as: TUMS - dosed in mg elemental calcium Chew 1 tablet by mouth every 8 (eight) hours as needed for indigestion.   calcium-vitamin D 500-200 MG-UNIT tablet Commonly known  as: OSCAL WITH D Take 1 tablet by mouth.   docusate sodium 100 MG capsule Commonly known as: COLACE Take 100 mg by mouth 2 (two) times daily.   enoxaparin 40 MG/0.4ML injection Commonly known as: LOVENOX Inject 0.4 mLs (40 mg total) into the skin daily for 28 days.   ferrous sulfate 325 (65 FE) MG EC tablet Take 325 mg by mouth daily with breakfast.   HYDROcodone-acetaminophen 5-325 MG tablet Commonly known as: NORCO/VICODIN Take 1 tablet by mouth in the morning.   HYDROcodone-acetaminophen 10-325 MG tablet Commonly known as: NORCO Take 1 tablet by mouth at bedtime.   melatonin 3 MG Tabs tablet Take 3 mg by mouth at bedtime as needed (  sleep).   metoprolol succinate 25 MG 24 hr tablet Commonly known as: TOPROL-XL Take 12.5 mg by mouth daily.   Multivitamin Adult Tabs Take 1 tablet by mouth daily.   nitroGLYCERIN 0.4 MG SL tablet Commonly known as: NITROSTAT Place 0.4 mg under the tongue every 5 (five) minutes as needed for chest pain.   ondansetron 4 MG tablet Commonly known as: ZOFRAN Take 4 mg by mouth daily as needed for nausea or vomiting.   oxyCODONE 5 MG immediate release tablet Commonly known as: Roxicodone Take 0.5 tablets (2.5 mg total) by mouth every 6 (six) hours as needed for severe pain.   pantoprazole 40 MG tablet Commonly known as: PROTONIX Take 1 tablet by mouth daily.   polyethylene glycol 17 g packet Commonly known as: MIRALAX / GLYCOLAX Take 17 g by mouth daily.   Preparation H Rapid Relief 5-0.25-14.4-15 % Crea Apply 1 application topically every 8 (eight) hours as needed (hemorrhoids).   simvastatin 20 MG tablet Commonly known as: ZOCOR Take 20 mg by mouth at bedtime.   tiZANidine 2 MG tablet Commonly known as: ZANAFLEX Take 2 mg by mouth every 8 (eight) hours as needed for muscle spasms.   vitamin C 500 MG tablet Commonly known as: ASCORBIC ACID Take 500 mg by mouth daily.       Discharge Instructions: Please refer to Patient  Instructions section of EMR for full details.  Patient was counseled important signs and symptoms that should prompt return to medical care, changes in medications, dietary instructions, activity restrictions, and follow up appointments.   Follow-Up Appointments:  Follow-up Information    Nicholes Stairs, MD In 2 weeks.   Specialty: Orthopedic Surgery Why: For wound re-check Contact information: [address] [place] [address] [phone]        Shona Needles, MD. Schedule an appointment as soon as possible for a visit in 2 week(s).   Specialty: Orthopedic Surgery Why: For splint removal left wrist, repeat x-rays left wrist and left hip, wound check Contact information: [place] [address] [place], [place], DO 04/01/2021, 10:58 AM PGY-1, [place]

## 2021-04-01 NOTE — Plan of Care (Signed)

## 2021-04-01 NOTE — Progress Notes (Signed)
Physical Therapy Treatment Patient Details Name: Savannah Coleman MRN: [id number] DOB: 04/18/1982 Today's Date: 04/01/2021    History of Present Illness Alaiah Lundy is an 87 y.o. female who was sustained a  left hip fracture, L distal radius fx and L proximal humerus fracture after a mechanical fall in the bathroom trying to kill a spider. Pt underwent ORIF to L hip and L wrist however is planned for L reverse shld surgery on wednesday 4/13. PSH: bilat THA, bilat TSA, R IM nail to femur fx 04/2020 PMH: COPD, HTN, anemia    PT Comments    Today's skilled session continued to focus on mobility progression. Pt able to perform stand pivot transfers today with assistance. Acute PT to continue during pt's hospital stay.   Follow Up Recommendations  SNF;Supervision/Assistance - 24 hour     Equipment Recommendations  Other (comment) (defer to next venue)    Recommendations for Other Services       Precautions / Restrictions Precautions Precautions: Fall Precaution Comments: s/p left reverse shoulder surgery on 4/13 (wed) Required Braces or Orthoses: Sling Splint/Cast: LUE in sling Restrictions LUE Weight Bearing: Non weight bearing LLE Weight Bearing: Weight bearing as tolerated Other Position/Activity Restrictions: can weight bear through elbow with platform RW per OP note    Mobility  Bed Mobility           Sit to supine: Min assist;+2 for physical assistance   General bed mobility comments: with HOB flat- one person to assist with trunk control with lying down and second person to manage LE's to clear bed surface.    Transfers Overall transfer level: Needs assistance Equipment used: Left platform walker Transfers: Sit to/from Stand;Stand Pivot Transfers Sit to Stand: Mod assist;+2 safety/equipment;+2 physical assistance Stand pivot transfers: +2 physical assistance;+2 safety/equipment;Min assist       General transfer comment: mod assist to stand chair>left PFRW. Min  for pivot steps chair to bed with step by step instructions needed, assist for balance/posture, then mod assist with cues on technique to sit controlled to EOB.  Ambulation/Gait             General Gait Details: deferred due to pt with reports of pain with stepping toward bed        Cognition Arousal/Alertness: Awake/alert Behavior During Therapy: WFL for tasks assessed/performed Overall Cognitive Status: Within Functional Limits for tasks assessed                                 General Comments: anxious regarding pain increase with movement       Pertinent Vitals/Pain Pain Assessment: Faces Faces Pain Scale: Hurts even more Pain Location: left LE with stepping/weight bearing Pain Descriptors / Indicators: Grimacing;Guarding Pain Intervention(s): Limited activity within patient's tolerance;Monitored during session;Patient requesting pain meds-RN notified;Repositioned     PT Goals (current goals can now be found in the care plan section) Acute Rehab PT Goals Patient Stated Goal: to gain indep PT Goal Formulation: With patient Time For Goal Achievement: 04/14/21 Potential to Achieve Goals: Good Progress towards PT goals: Progressing toward goals    Frequency    Min 3X/week      PT Plan Current plan remains appropriate    AM-PAC PT "6 Clicks" Mobility   Outcome Measure  Help needed turning from your back to your side while in a flat bed without using bedrails?: A Lot Help needed moving from lying on your back to sitting  on the side of a flat bed without using bedrails?: A Lot Help needed moving to and from a bed to a chair (including a wheelchair)?: A Lot Help needed standing up from a chair using your arms (e.g., wheelchair or bedside chair)?: A Lot Help needed to walk in hospital room?: Total Help needed climbing 3-5 steps with a railing? : Total 6 Click Score: 10    End of Session Equipment Utilized During Treatment: Gait belt Activity  Tolerance: Patient tolerated treatment well;Patient limited by pain Patient left: in bed;with call bell/phone within reach;with bed alarm set;with nursing/sitter in room;with family/visitor present;with SCD's reapplied Nurse Communication: Mobility status;Patient requests pain meds PT Visit Diagnosis: Unsteadiness on feet (R26.81);Muscle weakness (generalized) (M62.81);Pain;Difficulty in walking, not elsewhere classified (R26.2) Pain - Right/Left: Left Pain - part of body: Hip     Time: 9147-8295 PT Time Calculation (min) (ACUTE ONLY): 15 min  Charges:  $Therapeutic Activity: 8-22 mins                    Willow Ora, PTA, CLT Acute Rehab Services Office- [phone] 04/01/21, 2:17 PM   Willow Ora 04/01/2021, 2:17 PM

## 2021-04-01 NOTE — Plan of Care (Signed)
  Problem: Health Behavior/Discharge Planning: Goal: Ability to manage health-related needs will improve Outcome: Progressing   Problem: Activity: Goal: Risk for activity intolerance will decrease Outcome: Progressing   

## 2021-04-01 NOTE — Progress Notes (Signed)
Orthopaedic Trauma Progress Note  SUBJECTIVE: Pain better from yesterday, appears much more comfortable. Was able to stand at bedside with therapies yesterday but did note pain in the leg. No chest pain. No SOB. No nausea/vomiting. No other complaints.   OBJECTIVE:  Vitals:   04/01/21 0406 04/01/21 0820  BP: (!) 152/88 126/82  Pulse: 92 86  Resp: 16 16  Temp: 97.9 F (36.6 C) 98.8 F (37.1 C)  SpO2: 99% 93%    General: Laying in bed, NAD but appears uncomfortable Respiratory: No increased work of breathing.  Left Lower Extremity: Dressings CDI. Tender through thigh as expected. Tolerates gentle knee motion. Ankle dorsiflexion/planatrflexion intact and improved from yesterday. Compartments soft and compressible. Endorses sensation to light touch distally. +DP pulse Left Upper Extremity: Well-padded, well-fitting short arm splint in place. Sling in place. Bruising about the shoulder noted. Aquacel dressing CDI. Able to wiggle fingers. Fingers warm and well perfused with brisk cap refill.    IMAGING: Stable post op imaging.   LABS:  Results for orders placed or performed during the hospital encounter of 03/26/21 (from the past 24 hour(s))  CBC     Status: Abnormal   Collection Time: 04/01/21  2:16 AM  Result Value Ref Range   WBC 9.3 4.0 - 10.5 K/uL   RBC 2.60 (L) 3.87 - 5.11 MIL/uL   Hemoglobin 8.9 (L) 12.0 - 15.0 g/dL   HCT 26.1 (L) 36.0 - 46.0 %   MCV 100.4 (H) 80.0 - 100.0 fL   MCH 34.2 (H) 26.0 - 34.0 pg   MCHC 34.1 30.0 - 36.0 g/dL   RDW 13.2 11.5 - 15.5 %   Platelets 214 150 - 400 K/uL   nRBC 0.0 0.0 - 0.2 %  Basic metabolic panel     Status: Abnormal   Collection Time: 04/01/21  2:16 AM  Result Value Ref Range   Sodium 136 135 - 145 mmol/L   Potassium 3.6 3.5 - 5.1 mmol/L   Chloride 101 98 - 111 mmol/L   CO2 28 22 - 32 mmol/L   Glucose, Bld 104 (H) 70 - 99 mg/dL   BUN 13 8 - 23 mg/dL   Creatinine, Ser 0.63 0.44 - 1.00 mg/dL   Calcium 8.9 8.9 - 10.3 mg/dL   GFR,  Estimated >60 >60 mL/min   Anion gap 7 5 - 15    ASSESSMENT: Savannah Coleman is a 87 y.o. female, 2 Days Post-Op  Injuries: 1.  Left intertrochanteric femur fracture s/p intramedullary nailing 2.  Left distal radius/ulna fracture s/p ORIF 3.  Left proximal humerus fracture s/p closed treatment  CV/Blood loss: Acute blood loss anemia, Hgb 8.9 this morning. Hemodynamically stable  PLAN: Weightbearing: WBAT LLE. Ok for [employer] thru elbow for platform walking.  ROM: LLE - unrestricted ROM LUE - Ok for elbow motion as tolerated with arm at side. No active shoulder ROM.  Incisional and dressing care: LLE - Leave open to air LUE - Maintain splint to wrist and Aquacel dressing to shoulder until follow-up Showering: Ok to shower, do not get LUE splint wet Orthopedic device(s): Splint left wrist.  Sling LUE Pain management:  1. Tylenol 650 mg q 6 hours scheduled 2. Norco 5-325 daily 3. Oxycodone 2.5 mg q QID 4. Tizanidine 2 mg q 8 hours PRN 5. Dilaudid 0.25 mg q 4 hours PRN VTE prophylaxis: Lovenox, SCDs ID:  Ancef 2gm post op completed Foley/Lines:  No foley, KVO IVFs Impediments to Fracture Healing: Vitamin D level 49, no supplementation  needed.  Dispo: Therapies as tolerated.  Patient will need SNF. Ok for d/c from ortho trauma standpoint once cleared by medicine team and therapies. I have signed and placed d/c rx for DVT prophylaxis and pain medication in patient's chart Follow - up plan: We will continue to follow patient while in the hospital and plan for outpatient follow-up 2 weeks after discharge for repeat x-rays and wound check of the left hip and left wrist. Patient to follow-up with Dr. Stann Mainland for left shoulder  Contact information:  Katha Hamming MD, Patrecia Pace PA-C. After hours and holidays please check [email or web address] for group call information for [facility]   Laniesha Das A. Ricci Barker, PA-C [phone] (office) [email or web address]

## 2021-04-01 NOTE — TOC Transition Note (Incomplete)
Transition of Care [facility]) - CM/SW Discharge Note   Patient Details  Name: Savannah Coleman MRN: [id number] Date of Birth: 03-Jul-1931  [facility] CM/SW Contact:  Sharin Mons, RN Phone Number: 04/01/2021, 10:12 AM   Clinical Narrative:    Patient will DC to: [facility] Anticipated DC date: 04/01/2021 Family notified: YES Transport by: Corey Harold   Per MD patient ready for DC today . RN, patient, patient's family, and facility notified of DC. Discharge Summary and FL2 sent to facility. RN to call report prior to discharge [phone]). DC packet on chart. Ambulance transport requested for patient.   RNCM will sign off for now as intervention is no longer needed. Please consult [place] again if new needs arise.    Final next level of care: Skilled Nursing Facility Barriers to Discharge: No Barriers Identified   Patient Goals and CMS Choice        Discharge Placement                       Discharge Plan and Services                                     Social Determinants of Health (SDOH) Interventions     Readmission Risk Interventions No flowsheet data found.

## 2021-04-01 NOTE — Progress Notes (Signed)
Family Medicine Teaching Service Daily Progress Note Intern Pager: [phone]  Patient name: Savannah Coleman Medical record number: [id number] Date of birth: [birth year] Age: 87 y.o. Gender: female  Primary Care Provider: Pcp, No Consultants: orthopedic surgery Code Status: Full   Pt Overview and Major Events to Date:  4/10: Admitted   Assessment and Plan: Savannah Coleman a 87 y.o.femalepresenting with fall resulting in multiple fractures. PMH is significant forCOPD, HLD, HTN, R hip arthroplasty, lumbar fusion, bilateral wrist surgery.  Fall with multipleleft-sided fractures Denies any pain this morning. New fractures includingL proximal humerus, impacted L basicervical femur, L distal radius and ulnaseen on x-rayafter fallin bathroom at SNF. Head and neck CT showed no acute intracranial or c-spine injury.XR left wrist noted to have significantly improved position and alignment from previously demonstrated distal radius and ulna fractures with hardware fixation of the distal radius fracture.Risk factors: H/omultiple falls and fractures-04/2020 had fall in shower resulting in R hip and wrist fractures.Currently tolerating pain well under existing pain management.Vitamin D 49.03 wnl.Reverse left shoulder arthroplasty performed 4/13. Endorsing left shoulder pain this morning, but did not have any pain overnight following surgery.  -ortho consulted, appreciate continued involvement and recommendations include remaining in sling until regional block no longer takes effect, no lifting more than 5 pounds, continue wearing sling for 6 weeks, platform weightbearing okay.  -encourageearly mobilization -Tylenol 650 mg as needed -toradol 15 mg q6h for 4 doses -dilautid 0.25 mg q6h prn for breakthrough pain - scheduled miralax and PRN dulcolax/senna -Continue splintas advised by ortho -incentive spirometry  -PT/OT eval and treat: Recommendation includes SNF   Normocytic  Anemia Hgb8.9,unknown of exact etiology althoughlikely post-operative or dilutional as she is on fluids.Prior labs demonstrate macrocytic anemia. Folate and B12 workup unremarkable.No known source of bleeding noted, transfusion threshold 7.Ferritin 304.Patient received 1 unit pRBC prior to surgery. -monitor daily CBC -transfusion threshold 7  Leukocytosis Resolved.  -monitor CBC  HLD Home medsinclude simvastatin 20mg . No recent lipid panel in chart. Appears to be given as primary prevention.  Holding simvastatin for now -lipid panel outpatient  Hypertension Hypertensive this morning, BP 152/88. Home medsinclude amlodipine 5mg . -continue home amlodipine -monitor BP  GERD Home medsinclude pantoprazole.  -Pepcidprn  FEN/GI: regular PPx: lovenox    Status is: Inpatient    Dispo: The patient is from: SNF              Anticipated d/c is to: SNF              Patient currently is not medically stable to d/c.   Difficult to place patient No        Subjective:  No acute overnight events reported. Patient is doing great, denies any pain or dyspnea at this time.   Objective: Temp:  [97.9 F (36.6 C)-98.6 F (37 C)] 97.9 F (36.6 C) (04/15 0406) Pulse Rate:  [83-92] 92 (04/15 0406) Resp:  [16-18] 16 (04/15 0406) BP: (128-152)/(69-89) 152/88 (04/15 0406) SpO2:  [94 %-99 %] 99 % (04/15 0406) Physical Exam: General: Patient sitting upright in bed after finishing breakfast, in no acute distress. Cardiovascular: RRR, no murmurs or gallops auscultated  Respiratory: CTAB, no wheezing, rales or rhonchi  Abdomen: soft, nontender, BS+ Extremities: left sling in place, radial and distal pulses strong and equal bilaterally, SCDs in place Psych: mood appropriate, very pleasant   Laboratory: Recent Labs  Lab 03/30/21 0200 03/30/21 1519 03/31/21 0342 04/01/21 0216  WBC 9.0  --  11.8* 9.3  HGB 7.6* 10.2* 8.7* 8.9*  HCT 23.5* 30.0* 25.5* 26.1*  PLT 149*  --   183 214   Recent Labs  Lab 03/27/21 0003 03/27/21 0903 03/30/21 0200 03/30/21 1519 03/31/21 0342 04/01/21 0216  NA 137   < > 136 137 135 136  K 2.8*   < > 4.3 4.0 4.1 3.6  CL 102   < > 104 101 101 101  CO2 28   < > 26  --  27 28  BUN 9   < > 9 10 17 13   CREATININE 0.70   < > 0.54 0.50 0.80 0.63  CALCIUM 9.4   < > 8.8*  --  8.8* 8.9  PROT 6.5  --   --   --  5.0*  --   BILITOT 0.6  --   --   --  0.9  --   ALKPHOS 66  --   --   --  42  --   ALT 22  --   --   --  19  --   AST 28  --   --   --  32  --   GLUCOSE 150*   < > 94 109* 153* 104*   < > = values in this interval not displayed.      Imaging/Diagnostic Tests: No results found.  Donney Dice, DO 04/01/2021, 6:58 AM PGY-1, [place] Intern pager: [phone], text pages welcome

## 2021-04-02 LAB — CBC
HCT: 26 % — ABNORMAL LOW (ref 36.0–46.0)
Hemoglobin: 8.6 g/dL — ABNORMAL LOW (ref 12.0–15.0)
MCH: 33.5 pg (ref 26.0–34.0)
MCHC: 33.1 g/dL (ref 30.0–36.0)
MCV: 101.2 fL — ABNORMAL HIGH (ref 80.0–100.0)
Platelets: 234 10*3/uL (ref 150–400)
RBC: 2.57 MIL/uL — ABNORMAL LOW (ref 3.87–5.11)
RDW: 13.2 % (ref 11.5–15.5)
WBC: 9.3 10*3/uL (ref 4.0–10.5)
nRBC: 0 % (ref 0.0–0.2)

## 2021-04-02 LAB — BASIC METABOLIC PANEL
Anion gap: 4 — ABNORMAL LOW (ref 5–15)
BUN: 18 mg/dL (ref 8–23)
CO2: 30 mmol/L (ref 22–32)
Calcium: 8.9 mg/dL (ref 8.9–10.3)
Chloride: 101 mmol/L (ref 98–111)
Creatinine, Ser: 0.62 mg/dL (ref 0.44–1.00)
GFR, Estimated: >60 mL/min (ref >60)
Glucose, Bld: 101 mg/dL — ABNORMAL HIGH (ref 70–99)
Potassium: 3.5 mmol/L (ref 3.5–5.1)
Sodium: 135 mmol/L (ref 135–145)

## 2021-04-02 MED ORDER — OXYCODONE HCL 5 MG PO TABS: 5.0000 mg | ORAL_TABLET | Freq: Four times a day (QID) | ORAL | 0 refills | Status: DC | PRN

## 2021-04-02 MED ORDER — OXYCODONE HCL 5 MG PO TABS
5.0000 mg | ORAL_TABLET | Freq: Once | ORAL | Status: AC
Start: 2021-04-02 — End: 2021-04-02
  Administered 2021-04-02: 5 mg via ORAL
  Filled 2021-04-02: qty 1

## 2021-04-02 NOTE — TOC Transition Note (Signed)
Transition of Care [facility]) - CM/SW Discharge Note   Patient Details  Name: Savannah Coleman MRN: [id number] Date of Birth: January 15, 1931  [facility] CM/SW Contact:  Gabrielle Dare Phone Number: 04/02/2021, 9:45 AM   Clinical Narrative:    Patient will Discharge To: [facility] Anticipated DC Date: 04/02/21 Family Notified: yes, Daughter Tillie Fantasia, [phone] Transport [facility]   Per MD patient ready for DC to [facility] . RN, patient, patient's family, and facility notified of DC. Assessment, Fl2/Pasrr, and Discharge Summary sent to facility. RN given number for report [phone], Room 807B). DC packet on chart. Ambulance transport requested for patient for 10:30am  CSW signing off.  Reed Breech LCSWA [phone]     Final next level of care: Skilled Nursing Facility Barriers to Discharge: No Barriers Identified   Patient Goals and CMS Choice        Discharge Placement              Patient chooses bed at: [place], [place] Patient to be transferred to facility by: [facility] Name of family member notified: Tillie Fantasia [phone] Patient and family notified of of transfer: 04/02/21  Discharge Plan and Services                                     Social Determinants of Health (SDOH) Interventions     Readmission Risk Interventions No flowsheet data found.

## 2021-04-02 NOTE — Discharge Summary (Signed)
[facility] Discharge Summary  Patient name: Savannah Coleman Medical record number: [id number] Date of birth: Jan 20, 1931 Age: 87 y.o. Gender: female Date of Admission: 03/26/2021  Date of Discharge: 04/01/2021 Admitting Physician: Richarda Osmond, DO  Primary Care Provider: Pcp, No Consultants: orthopedic surgery  Indication for Hospitalization: fall resulting in multiple fractures   Discharge Diagnoses/Problem List:  Fall with multiple eft-sided fractures Normocytic anemia Hypokalemia  Leukocytosis  HLD HTN GERD  Disposition: SNF  Discharge Condition: medically stable   Discharge Exam:  Temp:  [97.9 F (36.6 C)-98.8 F (37.1 C)] 98.2 F (36.8 C) (04/15 1936) Pulse Rate:  [80-92] 85 (04/15 1936) Resp:  [16-19] 17 (04/15 1936) BP: (113-152)/(59-88) 118/69 (04/15 1936) SpO2:  [93 %-99 %] 95 % (04/15 1936) Physical Exam: General: initially sleeping. Easily awoken.  Alert, oriented.  Cardiovascular: RRR. No murmurs.  Respiratory: LCTAB.  Abdomen: soft, nontender.  NBS Extremities: left arm in sling.  Able to move left fingers.  Sensation in fingers intact.    Brief Hospital Course:  Savannah Coleman is a 87 y.o. female presenting with fall resulting in multiple fractures. PMH is significant for COPD, HLD, HTN, R hip arthroplasty, lumbar fusion, bilateral wrist surgery.   Fall with multiple fx Savannah Coleman presented to the ED via EMS after sustaining several injuries after a fall in the bathroom of her SNF. Patient reports a mechanical fall, tried to step on a spider and tripped. No LOC, no head trauma. Vital signs stable. CT head demonstrated no acute intracranial abnormality. CT C-spine negative for acute fracture or static subluxation. CT left hip demonstrated acute comminuted fracture of proximal left femur. XR left shoulder showed acute mildly comminuted displaced angulated fracture of left humeral neck. XR left wrist demonstrated acute comminuted  displaced angulated distal radius and ulnar fractures. Multiple fractures noted including L proximal humerus, impacted L basicervical femur, L distal radius and ulna. Patient received the following procedures without complications: cephalomedullary nailing of the left hip and left reverse shoulder arthroplasty.  Hypokalemia Potassium 2.8 on admission, improved after PO supplementation. Patient required repeated potassium supplementation during hospitalization. Potassium on day of discharge normalized to 3.6. Recommend recheck with PCP follow up in one week.   Normocytic anemia Noted to have this throughout hospitalization, Hgb 7.6 prior to reverse left shoulder arthroplasty which she received 1 unit pRBC with improvement. On discharge Hgb 8.9.   All other issues chronic and stable.     Issues for Follow Up:  1. Recheck BMP, specifically potassium as patient required aggressive K repletion.  2. Recommend patient be started on bisphosphonates as outpatient.  3. Recommend discussing if patient needs to continue Metoprolol as outpatient 4. Ensure patient follows up with orthopedics outpatient in 2 weeks for repeat imaging and wound check of left hip and left wrist with Savannah Coleman.  5. Repeat CBC, given persistent anemia.     Significant Procedures:  1. Cephalomedullary nailing of the left hip 2. Left shoulder reverse arthoplasty   Significant Labs and Imaging:  Recent Labs  Lab 03/31/21 0342 04/01/21 0216 04/02/21 0102  WBC 11.8* 9.3 9.3  HGB 8.7* 8.9* 8.6*  HCT 25.5* 26.1* 26.0*  PLT 183 214 234   Recent Labs  Lab 03/27/21 0003 03/27/21 0903 03/29/21 0142 03/30/21 0200 03/30/21 1519 03/31/21 0342 04/01/21 0216 04/02/21 0102  NA 137   < > 136 136 137 135 136 135  K 2.8*   < > 4.6 4.3 4.0 4.1 3.6 3.5  CL 102   < >  107 104 101 101 101 101  CO2 28   < > 26 26  --  27 28 30   GLUCOSE 150*   < > 102* 94 109* 153* 104* 101*  BUN 9   < > 13 9 10 17 13 18   CREATININE 0.70   < >  0.68 0.54 0.50 0.80 0.63 0.62  CALCIUM 9.4   < > 8.7* 8.8*  --  8.8* 8.9 8.9  MG 1.8  --   --   --   --   --   --   --   ALKPHOS 66  --   --   --   --  42  --   --   AST 28  --   --   --   --  32  --   --   ALT 22  --   --   --   --  19  --   --   ALBUMIN 3.9  --   --   --   --  2.8*  --   --    < > = values in this interval not displayed.      Results/Tests Pending at Time of Discharge:  none  Discharge Medications:  Allergies as of 04/02/2021      Reactions   Morphine And Related Nausea Only      Medication List    STOP taking these medications   HYDROcodone-acetaminophen 10-325 MG tablet Commonly known as: NORCO   HYDROcodone-acetaminophen 5-325 MG tablet Commonly known as: NORCO/VICODIN   meloxicam 7.5 MG tablet Commonly known as: MOBIC   metoprolol succinate 25 MG 24 hr tablet Commonly known as: TOPROL-XL     TAKE these medications   acetaminophen 325 MG tablet Commonly known as: TYLENOL Take 325 mg by mouth every 4 (four) hours as needed for moderate pain.   albuterol (2.5 MG/3ML) 0.083% nebulizer solution Commonly known as: PROVENTIL Take 2.5 mg by nebulization every 4 (four) hours as needed for wheezing or shortness of breath.   alum & mag hydroxide-simeth 973-532-99 MG/5ML suspension Commonly known as: MAALOX PLUS Take 30 mLs by mouth as directed. Give 30 ml once a week on Mondays & Give 30 mls by mouth every 6 hours PRN for Indigestion, Heartburn, Gas, Nausea   amLODipine 5 MG tablet Commonly known as: NORVASC Take 1 tablet by mouth daily.   bisacodyl 5 MG EC tablet Commonly known as: DULCOLAX Take 5 mg by mouth daily as needed for moderate constipation.   calcitonin (salmon) 200 UNIT/ACT nasal spray Commonly known as: MIACALCIN/FORTICAL Place 1 spray into alternate nostrils daily.   calcium carbonate 500 MG chewable tablet Commonly known as: TUMS - dosed in mg elemental calcium Chew 1 tablet by mouth every 8 (eight) hours as needed for  indigestion.   calcium-vitamin D 500-200 MG-UNIT tablet Commonly known as: OSCAL WITH D Take 1 tablet by mouth.   docusate sodium 100 MG capsule Commonly known as: COLACE Take 100 mg by mouth 2 (two) times daily.   enoxaparin 40 MG/0.4ML injection Commonly known as: LOVENOX Inject 0.4 mLs (40 mg total) into the skin daily for 28 days.   ferrous sulfate 325 (65 FE) MG EC tablet Take 325 mg by mouth daily with breakfast.   melatonin 3 MG Tabs tablet Take 3 mg by mouth at bedtime as needed (sleep).   Multivitamin Adult Tabs Take 1 tablet by mouth daily.   nitroGLYCERIN 0.4 MG SL tablet Commonly known as:  NITROSTAT Place 0.4 mg under the tongue every 5 (five) minutes as needed for chest pain.   ondansetron 4 MG tablet Commonly known as: ZOFRAN Take 4 mg by mouth daily as needed for nausea or vomiting.   oxyCODONE 5 MG immediate release tablet Commonly known as: Roxicodone Take 1 tablet (5 mg total) by mouth every 6 (six) hours as needed for severe pain.   pantoprazole 40 MG tablet Commonly known as: PROTONIX Take 1 tablet by mouth daily.   polyethylene glycol 17 g packet Commonly known as: MIRALAX / GLYCOLAX Take 17 g by mouth daily.   Preparation H Rapid Relief 5-0.25-14.4-15 % Crea Apply 1 application topically every 8 (eight) hours as needed (hemorrhoids).   simvastatin 20 MG tablet Commonly known as: ZOCOR Take 20 mg by mouth at bedtime.   tiZANidine 2 MG tablet Commonly known as: ZANAFLEX Take 2 mg by mouth every 8 (eight) hours as needed for muscle spasms.   vitamin C 500 MG tablet Commonly known as: ASCORBIC ACID Take 500 mg by mouth daily.            Discharge Care Instructions  (From admission, onward)         Start     Ordered   04/02/21 0000  Discharge wound care:        04/02/21 0906          Discharge Instructions: Please refer to Patient Instructions section of EMR for full details.  Patient was counseled important signs and  symptoms that should prompt return to medical care, changes in medications, dietary instructions, activity restrictions, and follow up appointments.   Follow-Up Appointments:  Follow-up Information    Nicholes Stairs, MD In 2 weeks.   Specialty: Orthopedic Surgery Why: For wound re-check Contact information: [address] [place] [address] [phone]        Shona Needles, MD. Schedule an appointment as soon as possible for a visit in 2 week(s).   Specialty: Orthopedic Surgery Why: For splint removal left wrist, repeat x-rays left wrist and left hip, wound check Contact information: [place] [address] [phone]               Benay Pike, MD 04/02/2021, 9:13 AM PGY-3, [place]

## 2021-04-02 NOTE — Progress Notes (Signed)
Family Medicine Teaching Service Daily Progress Note Intern Pager: [phone]  Patient name: Savannah Coleman Medical record number: [id number] Date of birth: 1931-02-28 Age: 87 y.o. Gender: female  Primary Care Provider: Pcp, No Consultants: orthopedic surgery Code Status: full  Pt Overview and Major Events to Date:  4/10 admitted  Assessment and Plan: Savannah Coleman a 87 y.o.femalepresenting with fall resulting in multiple fractures. PMH is significant forCOPD, HLD, HTN, R hip arthroplasty, lumbar fusion, bilateral wrist surgery.  Fall with multipleleft-sided fractures FracturedL proximal humerus, impacted L basicervical femur, L distal radius and ulnaafter fallin bathroom at SNF. Currently tolerating pain well under existing pain management.Vitamin D 49.03 wnl.Reverse left shoulder arthroplasty performed 4/13. -ortho consulted, appreciate continued involvement and recommendations include remaining in sling until regional block no longer takes effect, no lifting more than 5 pounds, continue wearing sling for 6 weeks, platform weightbearing okay. -encourageearly mobilization -Tylenol 650 mg as needed -toradol 15 mg q6h for 4 doses -dilaudid 0.5 mg q4h prn for breakthrough pain - scheduled miralax and PRN dulcolax/senna -Continue splintas advised by ortho -incentive spirometry  -PT/OT eval and treat: Recommendation includes SNF  Normocytic Anemia Hgb8.9,unknown of exact etiology althoughlikely post-operative or dilutional as she is on fluids.Prior labs demonstrate macrocytic anemia. Folate and B12 workup unremarkable.No known source of bleeding noted, transfusion threshold 7.Ferritin 304.Patient received 1 unit pRBC prior to surgery. -monitor daily CBC -transfusion threshold 7  HLD Home medsinclude simvastatin 20mg . No recent lipid panel in chart. Appears to be given as primary prevention.  Holding simvastatin for now given age -lipid panel  outpatient  Hypertension Home medsinclude amlodipine 5mg . -continue home amlodipine -monitor BP  GERD Home medsinclude pantoprazole.  -Pepcidprn  FEN/GI: regular PPx: lovenox    Disposition: SNF today  Subjective:  Pt feels good today.  Has some intermittent left shoulder pain but states her pain medicine is working well.  She feels okay to go to the SNF today.   Objective: Temp:  [97.9 F (36.6 C)-98.8 F (37.1 C)] 98.2 F (36.8 C) (04/15 1936) Pulse Rate:  [80-92] 85 (04/15 1936) Resp:  [16-19] 17 (04/15 1936) BP: (113-152)/(59-88) 118/69 (04/15 1936) SpO2:  [93 %-99 %] 95 % (04/15 1936) Physical Exam: General: initially sleeping. Easily awoken.  Alert, oriented.  Cardiovascular: RRR. No murmurs.  Respiratory: LCTAB.  Abdomen: soft, nontender.  NBS Extremities: left arm in sling.  Able to move left fingers.  Sensation in fingers intact.    Laboratory: Recent Labs  Lab 03/31/21 0342 04/01/21 0216 04/02/21 0102  WBC 11.8* 9.3 9.3  HGB 8.7* 8.9* 8.6*  HCT 25.5* 26.1* 26.0*  PLT 183 214 234   Recent Labs  Lab 03/27/21 0003 03/27/21 0903 03/31/21 0342 04/01/21 0216 04/02/21 0102  NA 137   < > 135 136 135  K 2.8*   < > 4.1 3.6 3.5  CL 102   < > 101 101 101  CO2 28   < > 27 28 30   BUN 9   < > 17 13 18   CREATININE 0.70   < > 0.80 0.63 0.62  CALCIUM 9.4   < > 8.8* 8.9 8.9  PROT 6.5  --  5.0*  --   --   BILITOT 0.6  --  0.9  --   --   ALKPHOS 66  --  42  --   --   ALT 22  --  19  --   --   AST 28  --  32  --   --  GLUCOSE 150*   < > 153* 104* 101*   < > = values in this interval not displayed.     Benay Pike, MD 04/02/2021, 3:52 AM PGY-3, [place] Intern pager: [phone], text pages welcome

## 2021-04-04 ENCOUNTER — Encounter (HOSPITAL_COMMUNITY): Payer: Self-pay | Admitting: Emergency Medicine

## 2021-04-12 ENCOUNTER — Other Ambulatory Visit: Payer: Self-pay

## 2021-04-12 ENCOUNTER — Non-Acute Institutional Stay: Payer: Medicare Other | Admitting: Hospice

## 2021-04-12 DIAGNOSIS — Z515 Encounter for palliative care: Secondary | ICD-10-CM

## 2021-04-12 DIAGNOSIS — S72002D Fracture of unspecified part of neck of left femur, subsequent encounter for closed fracture with routine healing: Secondary | ICD-10-CM

## 2021-04-12 DIAGNOSIS — W19XXXD Unspecified fall, subsequent encounter: Secondary | ICD-10-CM

## 2021-04-12 DIAGNOSIS — D62 Acute posthemorrhagic anemia: Secondary | ICD-10-CM

## 2021-04-12 NOTE — Progress Notes (Signed)
[facility] Consult Note Telephone: [phone]  Fax: [phone]  PATIENT NAME: Savannah Coleman [facility] [address] [place] [address] [phone] (home)  DOB: Jun 05, 1931 MRN: [id number]  PRIMARY CARE PROVIDER:   Dr. Leanna Coleman  REFERRING PROVIDER:   Dr. Leanna Coleman  RESPONSIBLE PARTY:   Daughter - Savannah Coleman [phone] Contact Information    Name Relation Home Work Mobile   Savannah Coleman Daughter [phone]  [phone]   Savannah Coleman Daughter   [phone]   Savannah Coleman Daughter [phone]  [phone]       I met face to face with patient and family at facility. Palliative Care was asked to follow this patient by consultation request of  Dr. Leanna Coleman to address advance care planning and complex medical decision making. This is the initial visit.  NP called Savannah Coleman before and after the visit updating her on palliative services and on patient's status.    ASSESSMENT AND / RECOMMENDATIONS:   Advance Care Planning: Our advance care planning conversation included a discussion about:     The value and importance of advance care planning   Difference between Hospice and Palliative care  Exploration of goals of care in the event of a sudden injury or illness   Identification and preparation of a healthcare agent   Review and updating or creation of an  advance directive document .  Decision not to resuscitate or to de-escalate disease focused treatments due to poor prognosis.  CODE STATUS: Patient affirmed she is a DO NOT RESUSCITATE.  NP signed DNR form for facility records; same document uploaded to epic today.  Goals of Care: Goals include to maximize quality of life and symptom management Patient is a member of [employer] and her spirituality helps her to cope I spent 16 minutes providing this initial consultation. More than 50% of the time in this consultation was spent on  counseling patient and coordinating communication. --------------------------------------------------------------------------------------------------------------------------------------  Symptom Management/Plan: Pain: Continue - Tylenol $RemoveB'325mg'gdAWULsy$  every 4 hours as needed for pain. Oxycodone $RemoveBeforeD'5mg'ryGyGyyrPdZiii$  every 6 hours as needed for severe pain and Tizanidine $RemoveBefore'2mg'McfvXrZVxRZLc$  every 8 hours as needed for muscle spasm Constipation: Biscacodyl EC $RemoveBefor'5mg'XYPWSwahotRU$  daily prn constipation.  17 g MiraLAX mixed in 4 to 6 ounces of fluid daily as needed for constipation Anemia: Continue Ferrous Sulphate. Routine CBC BMP.  Gait disturbance and Fall: PT/OT ongoing.  Falls precautions in place Follow up: Palliative care will continue to follow for complex medical decision making, advance care planning, and clarification of goals. Return 6 weeks or prn.Encouraged to call provider sooner with any concerns.   PPS: 30%  HOSPICE ELIGIBILITY/DIAGNOSIS: TBD  Chief Complaint: Acute pain  HISTORY OF PRESENT ILLNESS:  Savannah Coleman is a 87 y.o. year old female  with multiple medical conditions including acute on chronic pain related to recent left hip and shoulder fracture.  Pain is intermittent and interferes with patient's independence and activities of daily living.  It is worse when patient tries to move and alleviated when she lays still in her bed.  Pain gets up to 10 out of 10 on a pain scale where 0 is no pain and 10 is worst pain ever.  Patient was premedicated with before visit and said pain was at moderate - 5.  Patient was hospitalized 4/9-2/18/2022 for fall with multiple fractures. Epic records indicate patient sustained Multiple fractures noted including L proximal humerus, impacted L basicervical femur, L distal radius and ulna. Patient received the  following procedures without complications: cephalomedullary nailing of the left hip and left reverse shoulder arthroplasty. PMH is significant forCOPD, HLD, HTN, R hip arthroplasty, lumbar fusion,  bilateral wrist surgery. History obtained from review of EMR, discussion with primary team, caregiver, family and/or Savannah Coleman.  Review and summarization of Epic records shows history from other than patient. Rest of 10 point ROS asked and negative.  Palliative Care was asked to follow this patient by consultation request of Dr. Bevelyn Coleman to help address complex decision making in the context of advance care planning and goals of care clarification.    Review of lab tests/diagnostics   Results for Savannah Coleman (MRN [id number]) as of 04/12/2021 13:50 Lab 03/31/21 0342 04/01/21 0216 04/02/21 0102  WBC 11.8* 9.3 9.3  HGB 8.7* 8.9* 8.6*  HCT 25.5* 26.1* 26.0*  PLT 183 214 234     Ref. Range 04/02/2021 01:02  Sodium Latest Ref Range: 135 - 145 mmol/L 135  Potassium Latest Ref Range: 3.5 - 5.1 mmol/L 3.5  Chloride Latest Ref Range: 98 - 111 mmol/L 101  CO2 Latest Ref Range: 22 - 32 mmol/L 30  Glucose Latest Ref Range: 70 - 99 mg/dL 101 (H)  BUN Latest Ref Range: 8 - 23 mg/dL 18  Creatinine Latest Ref Range: 0.44 - 1.00 mg/dL 0.62  Calcium Latest Ref Range: 8.9 - 10.3 mg/dL 8.9  Anion gap Latest Ref Range: 5 - 15  4 (L)  GFR, Estimated Latest Ref Range: >60 mL/min >60  WBC Latest Ref Range: 4.0 - 10.5 K/uL 9.3  RBC Latest Ref Range: 3.87 - 5.11 MIL/uL 2.57 (L)   ROS  General: NAD EYES: denies vision changes ENMT: denies dysphagia Cardiovascular: denies chest pain/discomfort Pulmonary: denies cough, denies SOB Abdomen: endorses good appetite, denies constipation, endorses continence of bowel GU: denies dysuria, urinary frequency MSK: Endorsed weakness Skin: denies rashes or wounds Neurological: endorsed pain, denies insomnia Psych: Endorses positive mood Heme/lymph/immuno: denies bruises, abnormal bleeding  Physical Exam: BP119/64 T 97 FP70 R 19 02 97& RA Height/Weight: 5 feet 5 inches/120.8 Ibs Constitutional: NAD General: Well groomed EYES: anicteric sclera,  lids intact, no discharge  ENMT: Moist mucous membrane CV: S1 S2, RRR, no LE edema Pulmonary: LCTA, no increased work of breathing, no cough, Abdomen: active BS + 4 quadrants, soft and non tender, no ascites GU: no suprapubic tenderness MSK: weakness,  limited ROM, Ace wrap to left Skin: warm and dry, no rashes or wounds on visible skin Neuro:  weakness, otherwise non focal Psych: non-anxious affect Hem/lymph/immuno: no widespread bruising   PAST MEDICAL HISTORY:  Active Ambulatory Problems    Diagnosis Date Noted  . Hypertension   . Hyperlipidemia   . Adenomatous colon polyp   . Blood transfusion 03/19/2011  . History of shingles   . Rosacea   . Positive PPD   . Femur fracture, right ([place]) 04/30/2014  . Postoperative anemia due to acute blood loss 05/02/2014  . Unspecified constipation 05/05/2014  . Intertrochanteric fracture of right hip ([place]) 04/17/2020  . Closed fracture of right distal radius 04/17/2020  . Closed fracture of left distal radius   . Multiple fractures 03/27/2021  . Hypokalemia 03/27/2021  . Recurrent falls 03/27/2021  . Closed fracture of neck of left femur ([place]) 03/27/2021  . Closed fracture of left distal radius and ulna, initial encounter 03/27/2021  . Left humeral neck fracture 03/27/2021  . Essential hypertension 03/27/2021  . Hyperlipidemia 03/27/2021  . Resides in long term care facility 03/27/2021  .  Osteoporosis 03/27/2021  . Closed fracture of anatomical neck of left humerus   . Fall at nursing home   . Postoperative anemia due to acute blood loss 03/29/2021   Resolved Ambulatory Problems    Diagnosis Date Noted  . Colon polyps   . Other and unspecified hyperlipidemia 04/07/2013  . Routine general medical examination at a health care facility 04/07/2013  . Acute blood loss anemia 07/01/2014  . Fracture of right wrist 04/17/2020  . Closed avulsion fracture of lesser trochanter of femur, right, initial encounter ([place]) 04/17/2020  . Closed  avulsion fracture of lesser trochanter of right femur ([place]) 04/17/2020   Past Medical History:  Diagnosis Date  . Anemia   . Anxiety   . Arthritis   . Blood transfusion without reported diagnosis   . Closed intertrochanteric fracture of hip, right, initial encounter ([place]) 04/17/2020  . Constipation   . COPD (chronic obstructive pulmonary disease) ([place])   . Family history of anesthesia complication   . GERD (gastroesophageal reflux disease)   . Heartburn   . High cholesterol   . History of right radius fracture 04/2020  . History of vertebral fracture   . Insomnia   . Left Radius fracture 04/2014    SOCIAL HX:  Social History   Tobacco Use  . Smoking status: Never Smoker  . Smokeless tobacco: Never Used  . Tobacco comment: quit smoking in the early 80's  Substance Use Topics  . Alcohol use: Not Currently     FAMILY HX:  Family History  Problem Relation Age of Onset  . COPD Father   . Asthma Father   . Asthma Sister   . Colon cancer Neg Hx   . Stomach cancer Neg Hx       ALLERGIES:  Allergies  Allergen Reactions  . Morphine And Related Nausea Only      PERTINENT MEDICATIONS:  Outpatient Encounter Medications as of 04/12/2021  Medication Sig  . acetaminophen (TYLENOL) 325 MG tablet Take 325 mg by mouth every 4 (four) hours as needed for moderate pain.  Marland Kitchen acetaminophen (TYLENOL) 500 MG tablet Take 1 tablet (500 mg total) by mouth every 12 (twelve) hours.  Marland Kitchen albuterol (PROVENTIL) (2.5 MG/3ML) 0.083% nebulizer solution Take 2.5 mg by nebulization every 4 (four) hours as needed for wheezing or shortness of breath.  Marland Kitchen alum & mag hydroxide-simeth (MAALOX PLUS) 400-400-40 MG/5ML suspension Take 30 mLs by mouth as directed. Give 30 ml once a week on Mondays & Give 30 mls by mouth every 6 hours PRN for Indigestion, Heartburn, Gas, Nausea  . amLODipine (NORVASC) 5 MG tablet Take 5 mg by mouth daily.   Marland Kitchen amLODipine (NORVASC) 5 MG tablet Take 1 tablet by mouth daily.  Marland Kitchen  ascorbic acid (VITAMIN C) 500 MG tablet Take 1 tablet (500 mg total) by mouth daily.  Marland Kitchen BIOTIN PO Take 1 tablet by mouth daily with lunch.  . bisacodyl (DULCOLAX) 5 MG EC tablet Take 5 mg by mouth daily as needed for moderate constipation.  . calcitonin, salmon, (MIACALCIN/FORTICAL) 200 UNIT/ACT nasal spray Place 1 spray into alternate nostrils daily.  . Calcium Carb-Cholecalciferol (CALCIUM+D3 PO) Take 1 tablet by mouth daily with lunch.  . calcium carbonate (TUMS - DOSED IN MG ELEMENTAL CALCIUM) 500 MG chewable tablet Chew 1 tablet by mouth every 8 (eight) hours as needed for indigestion.  . calcium-vitamin D (OSCAL WITH D) 500-200 MG-UNIT tablet Take 1 tablet by mouth.  . Carboxymethylcellulose Sodium (THERATEARS OP) Place 1 drop into  both eyes daily.  . diphenhydramine-acetaminophen (TYLENOL PM) 25-500 MG TABS tablet Take 1 tablet by mouth at bedtime as needed (sleep).  . docusate sodium (COLACE) 100 MG capsule Take 1 capsule (100 mg total) by mouth 2 (two) times daily.  Marland Kitchen docusate sodium (COLACE) 100 MG capsule Take 100 mg by mouth 2 (two) times daily.  Marland Kitchen enoxaparin (LOVENOX) 40 MG/0.4ML injection Inject 0.4 mLs (40 mg total) into the skin daily for 28 days.  Marland Kitchen enoxaparin (LOVENOX) 40 MG/0.4ML injection Inject 0.4 mLs (40 mg total) into the skin daily for 28 days.  . ferrous sulfate 325 (65 FE) MG EC tablet Take 325 mg by mouth daily with breakfast.  . ferrous sulfate 325 (65 FE) MG tablet Take 1 tablet (325 mg total) by mouth daily with breakfast.  . HYDROcodone-acetaminophen (NORCO/VICODIN) 5-325 MG tablet Take 1 tablet by mouth every 6 (six) hours as needed for severe pain.  . Lido-PE-Glycerin-Petrolatum (PREPARATION H RAPID RELIEF) 5-0.25-14.4-15 % CREA Apply 1 application topically every 8 (eight) hours as needed (hemorrhoids).  . melatonin 3 MG TABS tablet Take 3 mg by mouth at bedtime as needed (sleep).  . Multiple Vitamin (MULTIVITAMIN ADULT) TABS Take 1 tablet by mouth daily.  .  Multiple Vitamin (MULTIVITAMIN WITH MINERALS) TABS tablet Take 1 tablet by mouth daily with lunch.  . nitroGLYCERIN (NITROSTAT) 0.4 MG SL tablet Place 0.4 mg under the tongue every 5 (five) minutes as needed for chest pain.  Marland Kitchen ondansetron (ZOFRAN) 4 MG tablet Take 4 mg by mouth daily as needed for nausea or vomiting.  Marland Kitchen oxyCODONE (ROXICODONE) 5 MG immediate release tablet Take 1 tablet (5 mg total) by mouth every 6 (six) hours as needed for severe pain.  . pantoprazole (PROTONIX) 40 MG tablet Take 40 mg by mouth daily.  . pantoprazole (PROTONIX) 40 MG tablet Take 1 tablet by mouth daily.  . polyethylene glycol (MIRALAX / GLYCOLAX) 17 g packet Take 17 g by mouth daily.  . polyethylene glycol (MIRALAX / GLYCOLAX) 17 g packet Take 17 g by mouth daily.  Marland Kitchen POTASSIUM PO Take 1 tablet by mouth daily with lunch.  . simvastatin (ZOCOR) 20 MG tablet Take 20 mg by mouth at bedtime.  . simvastatin (ZOCOR) 20 MG tablet Take 20 mg by mouth at bedtime.  Marland Kitchen tiZANidine (ZANAFLEX) 2 MG tablet Take 2 mg by mouth every 8 (eight) hours as needed for muscle spasms.  . vitamin C (ASCORBIC ACID) 500 MG tablet Take 500 mg by mouth daily.  Marland Kitchen VITAMIN D PO Take 1 tablet by mouth daily with lunch.   No facility-administered encounter medications on file as of 04/12/2021.     Thank you for the opportunity to participate in the care of Ms. Hilgert.  The palliative care team will continue to follow. Please call our office at [phone] if we can be of additional assistance.   Note: Portions of this note were generated with [employer]. Dictation errors may occur despite best attempts at proofreading.  Teodoro Spray, NP

## 2021-06-10 ENCOUNTER — Non-Acute Institutional Stay: Payer: Medicare Other | Admitting: Hospice

## 2021-06-10 ENCOUNTER — Other Ambulatory Visit: Payer: Self-pay

## 2021-06-10 DIAGNOSIS — R531 Weakness: Secondary | ICD-10-CM

## 2021-06-10 DIAGNOSIS — Z515 Encounter for palliative care: Secondary | ICD-10-CM

## 2021-06-10 DIAGNOSIS — K5901 Slow transit constipation: Secondary | ICD-10-CM

## 2021-06-10 DIAGNOSIS — R52 Pain, unspecified: Secondary | ICD-10-CM

## 2021-06-10 NOTE — Progress Notes (Signed)
[facility] Consult Note Telephone: [phone]  Fax: [phone]  PATIENT NAME: Savannah Coleman DOB: 1931-06-20 MRN: [id number]  PRIMARY CARE PROVIDER:   Dr. Leanna Battles   REFERRING PROVIDER:   Dr. Leanna Battles   RESPONSIBLE PARTY:   Daughter - Santiago Glad [phone] Contact Information     Name Relation Home Work Mobile   Segal,Kimberly Daughter [phone]  [phone]   Kebra, [place] Daughter   [phone]   Segal,Kimberly Daughter [phone]  [phone]       Visit is to build trust and highlight Palliative Medicine as specialized medical care for people living with serious illness, aimed at facilitating better quality of life through symptoms relief, assisting with advance care planning and complex medical decision making. NP called Santiago Glad and updated her on visit and patient's status. She expressed appreciate for the call/update. This is a follow up visit.  RECOMMENDATIONS/PLAN:   Advance Care Planning/Code Status: Patient is a DO NOT RESUSCITATE.  Goals of Care: Goals of care include to maximize quality of life and symptom management.  She wishes to be able to stand up and ambulate to the bathroom without supervision or assistance.  Visit consisted of counseling and education dealing with the complex and emotionally intense issues of symptom management and palliative care in the setting of serious and potentially life-threatening illness.  Patient affirmed she copes with her [name] faith.  Palliative care team will continue to support patient, patient's family, and medical team.  Symptom management/Plan:  Pain to left shoulder and left hip improved.  Continue Tylenol, oxycodone and tizanidine as ordered. Weakness: Improving.  Patient ambulates with rolling walker, supervision only.  Continue PT OT Constipation: Recent constipation resolved.  Continue bisacodyl and MiraLAX as ordered.  Encourage mobility/activity,  adequate hydration and incorporate fruits and vegetables in meals. Follow up: Palliative care will continue to follow for complex medical decision making, advance care planning, and clarification of goals. Return 6 weeks or prn. Encouraged to call provider sooner with any concerns.  CHIEF COMPLAINT: Palliative follow up  HISTORY OF PRESENT ILLNESS:  Savannah Coleman a 87 y.o. female with multiple medical problems including constipation which is resolved at this time, multiple fractures - L proximal humerus, impacted L basicervical femur, L distal radius and ulna with subsequent cephalomedullary nailing of the left hip and left reverse shoulder arthroplasty. PMH is significant for COPD, HLD, HTN, R hip arthroplasty, lumbar fusion, bilateral wrist surgery.  Patient reports pain is well controlled, feels improved sense of wellbeing; no complaints; nursing staff with no concerns at this time.  History obtained from review of EMR, discussion with primary team, family and/or patient. Records reviewed and summarized above. All 10 point systems reviewed and are negative except as documented in history of present illness above  Review and summarization of Epic records shows history from other than patient.   Palliative Care was asked to follow this patient o help address complex decision making in the context of advance care planning and goals of care clarification.   PHYSICAL EXAM General: In no acute distress, appropriately dressed Cardiovascular: regular rate and rhythm Pulmonary: no cough, no increased work of breathing, normal respiratory effort Abdomen: soft, non tender, no guarding, positive bowel sounds in all quadrants GU:  no suprapubic tenderness Eyes: Normal lids, no discharge ENMT: Moist mucous membranes Musculoskeletal:  weakness, ambulatory with rolling walker Skin: no rash to visible skin, warm without cyanosis,  Psych: non-anxious affect Neurological: Weakness but otherwise non  focal Heme/lymph/immuno: no bruises, no bleeding  PERTINENT MEDICATIONS:  Outpatient Encounter Medications as of 06/10/2021  Medication Sig   acetaminophen (TYLENOL) 325 MG tablet Take 325 mg by mouth every 4 (four) hours as needed for moderate pain.   acetaminophen (TYLENOL) 500 MG tablet Take 1 tablet (500 mg total) by mouth every 12 (twelve) hours.   albuterol (PROVENTIL) (2.5 MG/3ML) 0.083% nebulizer solution Take 2.5 mg by nebulization every 4 (four) hours as needed for wheezing or shortness of breath.   alum & mag hydroxide-simeth (MAALOX PLUS) 400-400-40 MG/5ML suspension Take 30 mLs by mouth as directed. Give 30 ml once a week on Mondays & Give 30 mls by mouth every 6 hours PRN for Indigestion, Heartburn, Gas, Nausea   amLODipine (NORVASC) 5 MG tablet Take 5 mg by mouth daily.    amLODipine (NORVASC) 5 MG tablet Take 1 tablet by mouth daily.   ascorbic acid (VITAMIN C) 500 MG tablet Take 1 tablet (500 mg total) by mouth daily.   BIOTIN PO Take 1 tablet by mouth daily with lunch.   bisacodyl (DULCOLAX) 5 MG EC tablet Take 5 mg by mouth daily as needed for moderate constipation.   calcitonin, salmon, (MIACALCIN/FORTICAL) 200 UNIT/ACT nasal spray Place 1 spray into alternate nostrils daily.   Calcium Carb-Cholecalciferol (CALCIUM+D3 PO) Take 1 tablet by mouth daily with lunch.   calcium carbonate (TUMS - DOSED IN MG ELEMENTAL CALCIUM) 500 MG chewable tablet Chew 1 tablet by mouth every 8 (eight) hours as needed for indigestion.   calcium-vitamin D (OSCAL WITH D) 500-200 MG-UNIT tablet Take 1 tablet by mouth.   Carboxymethylcellulose Sodium (THERATEARS OP) Place 1 drop into both eyes daily.   diphenhydramine-acetaminophen (TYLENOL PM) 25-500 MG TABS tablet Take 1 tablet by mouth at bedtime as needed (sleep).   docusate sodium (COLACE) 100 MG capsule Take 1 capsule (100 mg total) by mouth 2 (two) times daily.   docusate sodium (COLACE) 100 MG capsule Take 100 mg by mouth 2 (two) times daily.    enoxaparin (LOVENOX) 40 MG/0.4ML injection Inject 0.4 mLs (40 mg total) into the skin daily for 28 days.   enoxaparin (LOVENOX) 40 MG/0.4ML injection Inject 0.4 mLs (40 mg total) into the skin daily for 28 days.   ferrous sulfate 325 (65 FE) MG EC tablet Take 325 mg by mouth daily with breakfast.   ferrous sulfate 325 (65 FE) MG tablet Take 1 tablet (325 mg total) by mouth daily with breakfast.   HYDROcodone-acetaminophen (NORCO/VICODIN) 5-325 MG tablet Take 1 tablet by mouth every 6 (six) hours as needed for severe pain.   Lido-PE-Glycerin-Petrolatum (PREPARATION H RAPID RELIEF) 5-0.25-14.4-15 % CREA Apply 1 application topically every 8 (eight) hours as needed (hemorrhoids).   melatonin 3 MG TABS tablet Take 3 mg by mouth at bedtime as needed (sleep).   Multiple Vitamin (MULTIVITAMIN ADULT) TABS Take 1 tablet by mouth daily.   Multiple Vitamin (MULTIVITAMIN WITH MINERALS) TABS tablet Take 1 tablet by mouth daily with lunch.   nitroGLYCERIN (NITROSTAT) 0.4 MG SL tablet Place 0.4 mg under the tongue every 5 (five) minutes as needed for chest pain.   ondansetron (ZOFRAN) 4 MG tablet Take 4 mg by mouth daily as needed for nausea or vomiting.   oxyCODONE (ROXICODONE) 5 MG immediate release tablet Take 1 tablet (5 mg total) by mouth every 6 (six) hours as needed for severe pain.   pantoprazole (PROTONIX) 40 MG tablet Take 40 mg by mouth daily.   pantoprazole (PROTONIX) 40 MG tablet  Take 1 tablet by mouth daily.   polyethylene glycol (MIRALAX / GLYCOLAX) 17 g packet Take 17 g by mouth daily.   polyethylene glycol (MIRALAX / GLYCOLAX) 17 g packet Take 17 g by mouth daily.   POTASSIUM PO Take 1 tablet by mouth daily with lunch.   simvastatin (ZOCOR) 20 MG tablet Take 20 mg by mouth at bedtime.   simvastatin (ZOCOR) 20 MG tablet Take 20 mg by mouth at bedtime.   tiZANidine (ZANAFLEX) 2 MG tablet Take 2 mg by mouth every 8 (eight) hours as needed for muscle spasms.   vitamin C (ASCORBIC ACID) 500 MG  tablet Take 500 mg by mouth daily.   VITAMIN D PO Take 1 tablet by mouth daily with lunch.   No facility-administered encounter medications on file as of 06/10/2021.    HOSPICE ELIGIBILITY/DIAGNOSIS: TBD  PAST MEDICAL HISTORY:  Past Medical History:  Diagnosis Date   Adenomatous colon polyp 2008   Anemia    Anxiety    Arthritis    knees   Blood transfusion 03/2011   Blood transfusion without reported diagnosis    Closed fracture of left distal radius    Closed fracture of right distal radius 04/17/2020   Closed intertrochanteric fracture of hip, right, initial encounter ([place]) 04/17/2020   Colon polyps    Constipation    COPD (chronic obstructive pulmonary disease) ([place])    Family history of anesthesia complication    " my son aspirated "   Femur fracture, right ([place]) 2015   GERD (gastroesophageal reflux disease)    Heartburn    High cholesterol    History of right radius fracture 04/2020   History of shingles    History of vertebral fracture    Hyperlipidemia    Hypertension    Insomnia    Left Radius fracture 04/2014   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   Recurrent falls 03/27/2021   Resides in long term care facility 03/27/2021   Rosacea      ALLERGIES:  Allergies  Allergen Reactions   Morphine And Related Nausea Only      I spent 50 minutes providing this consultation; this includes time spent with patient/family, chart review and documentation. More than 50% of the time in this consultation was spent on counseling and coordinating communication   Thank you for the opportunity to participate in the care of ZOEANN MOL Please call our office at [phone] if we can be of additional assistance.  Note: Portions of this note were generated with Lobbyist. Dictation errors may occur despite best attempts at proofreading.  Teodoro Spray, NP

## 2021-07-19 ENCOUNTER — Encounter (HOSPITAL_COMMUNITY)
Admission: EM | Disposition: A | Discharge status: Skilled Nursing Facility | Payer: Self-pay | Source: Skilled Nursing Facility | Attending: Internal Medicine

## 2021-07-19 ENCOUNTER — Inpatient Hospital Stay (HOSPITAL_COMMUNITY)
Admission: EM | Admit: 2021-07-19 | Discharge: 2021-07-22 | DRG: 854 | Disposition: A | Discharge status: Skilled Nursing Facility | Payer: Medicare Other | Source: Skilled Nursing Facility | Attending: Internal Medicine | Admitting: Internal Medicine

## 2021-07-19 ENCOUNTER — Emergency Department (HOSPITAL_COMMUNITY): Payer: Medicare Other

## 2021-07-19 ENCOUNTER — Other Ambulatory Visit: Payer: Self-pay

## 2021-07-19 ENCOUNTER — Encounter (HOSPITAL_COMMUNITY): Payer: Self-pay

## 2021-07-19 DIAGNOSIS — E876 Hypokalemia: Secondary | ICD-10-CM | POA: Diagnosis present

## 2021-07-19 DIAGNOSIS — Z96612 Presence of left artificial shoulder joint: Secondary | ICD-10-CM | POA: Diagnosis present

## 2021-07-19 DIAGNOSIS — Z885 Allergy status to narcotic agent status: Secondary | ICD-10-CM

## 2021-07-19 DIAGNOSIS — G8929 Other chronic pain: Secondary | ICD-10-CM | POA: Diagnosis present

## 2021-07-19 DIAGNOSIS — F419 Anxiety disorder, unspecified: Secondary | ICD-10-CM | POA: Diagnosis present

## 2021-07-19 DIAGNOSIS — E785 Hyperlipidemia, unspecified: Secondary | ICD-10-CM | POA: Diagnosis not present

## 2021-07-19 DIAGNOSIS — R296 Repeated falls: Secondary | ICD-10-CM | POA: Diagnosis present

## 2021-07-19 DIAGNOSIS — A419 Sepsis, unspecified organism: Secondary | ICD-10-CM | POA: Diagnosis not present

## 2021-07-19 DIAGNOSIS — I10 Essential (primary) hypertension: Secondary | ICD-10-CM | POA: Diagnosis present

## 2021-07-19 DIAGNOSIS — M1990 Unspecified osteoarthritis, unspecified site: Secondary | ICD-10-CM | POA: Diagnosis present

## 2021-07-19 DIAGNOSIS — N136 Pyonephrosis: Secondary | ICD-10-CM | POA: Diagnosis present

## 2021-07-19 DIAGNOSIS — G4700 Insomnia, unspecified: Secondary | ICD-10-CM | POA: Diagnosis present

## 2021-07-19 DIAGNOSIS — Z9049 Acquired absence of other specified parts of digestive tract: Secondary | ICD-10-CM | POA: Diagnosis not present

## 2021-07-19 DIAGNOSIS — Z79899 Other long term (current) drug therapy: Secondary | ICD-10-CM | POA: Diagnosis not present

## 2021-07-19 DIAGNOSIS — M810 Age-related osteoporosis without current pathological fracture: Secondary | ICD-10-CM | POA: Diagnosis present

## 2021-07-19 DIAGNOSIS — Z86010 Personal history of colonic polyps: Secondary | ICD-10-CM | POA: Diagnosis not present

## 2021-07-19 DIAGNOSIS — N3000 Acute cystitis without hematuria: Secondary | ICD-10-CM

## 2021-07-19 DIAGNOSIS — J449 Chronic obstructive pulmonary disease, unspecified: Secondary | ICD-10-CM | POA: Diagnosis present

## 2021-07-19 DIAGNOSIS — Z981 Arthrodesis status: Secondary | ICD-10-CM

## 2021-07-19 DIAGNOSIS — K219 Gastro-esophageal reflux disease without esophagitis: Secondary | ICD-10-CM | POA: Diagnosis present

## 2021-07-19 DIAGNOSIS — N390 Urinary tract infection, site not specified: Secondary | ICD-10-CM | POA: Diagnosis not present

## 2021-07-19 DIAGNOSIS — Z66 Do not resuscitate: Secondary | ICD-10-CM | POA: Diagnosis present

## 2021-07-19 DIAGNOSIS — Z96653 Presence of artificial knee joint, bilateral: Secondary | ICD-10-CM | POA: Diagnosis present

## 2021-07-19 DIAGNOSIS — N200 Calculus of kidney: Secondary | ICD-10-CM | POA: Diagnosis present

## 2021-07-19 DIAGNOSIS — Z825 Family history of asthma and other chronic lower respiratory diseases: Secondary | ICD-10-CM | POA: Diagnosis not present

## 2021-07-19 DIAGNOSIS — E7800 Pure hypercholesterolemia, unspecified: Secondary | ICD-10-CM | POA: Diagnosis present

## 2021-07-19 DIAGNOSIS — A4151 Sepsis due to Escherichia coli [E. coli]: Secondary | ICD-10-CM | POA: Diagnosis present

## 2021-07-19 DIAGNOSIS — Z20822 Contact with and (suspected) exposure to covid-19: Secondary | ICD-10-CM | POA: Diagnosis present

## 2021-07-19 DIAGNOSIS — B9620 Unspecified Escherichia coli [E. coli] as the cause of diseases classified elsewhere: Secondary | ICD-10-CM

## 2021-07-19 HISTORY — PX: CYSTOSCOPY WITH URETEROSCOPY AND STENT PLACEMENT: SHX6377

## 2021-07-19 LAB — COMPREHENSIVE METABOLIC PANEL
ALT: 38 U/L (ref 0–44)
AST: 53 U/L — ABNORMAL HIGH (ref 15–41)
Albumin: 2.7 g/dL — ABNORMAL LOW (ref 3.5–5.0)
Alkaline Phosphatase: 96 U/L (ref 38–126)
Anion gap: 12 (ref 5–15)
BUN: 18 mg/dL (ref 8–23)
CO2: 24 mmol/L (ref 22–32)
Calcium: 9.3 mg/dL (ref 8.9–10.3)
Chloride: 101 mmol/L (ref 98–111)
Creatinine, Ser: 0.91 mg/dL (ref 0.44–1.00)
GFR, Estimated: >60 mL/min (ref >60)
Glucose, Bld: 132 mg/dL — ABNORMAL HIGH (ref 70–99)
Potassium: 2.5 mmol/L — CL (ref 3.5–5.1)
Sodium: 137 mmol/L (ref 135–145)
Total Bilirubin: 1 mg/dL (ref 0.3–1.2)
Total Protein: 6.2 g/dL — ABNORMAL LOW (ref 6.5–8.1)

## 2021-07-19 LAB — CBC WITH DIFFERENTIAL/PLATELET
Abs Immature Granulocytes: 0.25 10*3/uL — ABNORMAL HIGH (ref 0.00–0.07)
Basophils Absolute: 0.1 10*3/uL (ref 0.0–0.1)
Basophils Relative: 0 %
Eosinophils Absolute: 0 10*3/uL (ref 0.0–0.5)
Eosinophils Relative: 0 %
HCT: 38.9 % (ref 36.0–46.0)
Hemoglobin: 13.4 g/dL (ref 12.0–15.0)
Immature Granulocytes: 1 %
Lymphocytes Relative: 3 %
Lymphs Abs: 0.6 10*3/uL — ABNORMAL LOW (ref 0.7–4.0)
MCH: 31.9 pg (ref 26.0–34.0)
MCHC: 34.4 g/dL (ref 30.0–36.0)
MCV: 92.6 fL (ref 80.0–100.0)
Monocytes Absolute: 1.8 10*3/uL — ABNORMAL HIGH (ref 0.1–1.0)
Monocytes Relative: 9 %
Neutro Abs: 16.3 10*3/uL — ABNORMAL HIGH (ref 1.7–7.7)
Neutrophils Relative %: 87 %
Platelets: 166 10*3/uL (ref 150–400)
RBC: 4.2 MIL/uL (ref 3.87–5.11)
RDW: 14.2 % (ref 11.5–15.5)
WBC: 19 10*3/uL — ABNORMAL HIGH (ref 4.0–10.5)
nRBC: 0 % (ref 0.0–0.2)

## 2021-07-19 LAB — RESP PANEL BY RT-PCR (FLU A&B, COVID) ARPGX2
Influenza A by PCR: NEGATIVE
Influenza B by PCR: NEGATIVE
SARS Coronavirus 2 by RT PCR: NEGATIVE

## 2021-07-19 LAB — URINALYSIS, ROUTINE W REFLEX MICROSCOPIC
Bilirubin Urine: NEGATIVE
Glucose, UA: NEGATIVE mg/dL
Hgb urine dipstick: NEGATIVE
Ketones, ur: NEGATIVE mg/dL
Nitrite: NEGATIVE
Protein, ur: NEGATIVE mg/dL
Specific Gravity, Urine: 1.018 (ref 1.005–1.030)
WBC, UA: >50 WBC/hpf — ABNORMAL HIGH (ref 0–5)
pH: 6 (ref 5.0–8.0)

## 2021-07-19 LAB — LACTIC ACID, PLASMA
Lactic Acid, Venous: 2.1 mmol/L (ref 0.5–1.9)
Lactic Acid, Venous: 1.2 mmol/L (ref 0.5–1.9)

## 2021-07-19 LAB — LIPASE, BLOOD: Lipase: 29 U/L (ref 11–51)

## 2021-07-19 SURGERY — CYSTOURETEROSCOPY, WITH STENT INSERTION
Anesthesia: General | Site: Ureter | Laterality: Right

## 2021-07-19 MED ORDER — CEFAZOLIN SODIUM-DEXTROSE 2-4 GM/100ML-% IV SOLN
INTRAVENOUS | Status: AC
  Filled 2021-07-19: qty 100

## 2021-07-19 MED ORDER — CEFTRIAXONE SODIUM 1 G IJ SOLR: 1.0000 g | INTRAMUSCULAR | Status: DC

## 2021-07-19 MED ORDER — SODIUM CHLORIDE 0.9 % IV BOLUS
1000.0000 mL | Freq: Once | INTRAVENOUS | Status: AC
  Administered 2021-07-19: 1000 mL via INTRAVENOUS

## 2021-07-19 MED ORDER — ONDANSETRON HCL 4 MG/2ML IJ SOLN
4.0000 mg | Freq: Once | INTRAMUSCULAR | Status: AC
  Administered 2021-07-19: 4 mg via INTRAVENOUS
  Filled 2021-07-19: qty 2

## 2021-07-19 MED ORDER — PROPOFOL 10 MG/ML IV BOLUS
INTRAVENOUS | Status: AC
  Filled 2021-07-19: qty 20

## 2021-07-19 MED ORDER — ACETAMINOPHEN 10 MG/ML IV SOLN
INTRAVENOUS | Status: AC
  Filled 2021-07-19: qty 100

## 2021-07-19 MED ORDER — SODIUM CHLORIDE 0.9 % IV SOLN
INTRAVENOUS | Status: AC
  Filled 2021-07-19: qty 20

## 2021-07-19 MED ORDER — FENTANYL CITRATE (PF) 100 MCG/2ML IJ SOLN
INTRAMUSCULAR | Status: AC
  Filled 2021-07-19: qty 2

## 2021-07-19 MED ORDER — SODIUM CHLORIDE 0.9 % IV SOLN
1.0000 g | INTRAVENOUS | Status: AC
  Filled 2021-07-19: qty 10

## 2021-07-19 MED ORDER — POTASSIUM CHLORIDE 10 MEQ/100ML IV SOLN
10.0000 meq | INTRAVENOUS | Status: AC
  Administered 2021-07-19 (×3): 10 meq via INTRAVENOUS
  Filled 2021-07-19 (×3): qty 100

## 2021-07-19 MED ORDER — SODIUM CHLORIDE 0.9 % IV SOLN
1.0000 g | INTRAVENOUS | Status: DC
  Administered 2021-07-20: 1 g via INTRAVENOUS

## 2021-07-19 MED ORDER — IOHEXOL 350 MG/ML SOLN
80.0000 mL | Freq: Once | INTRAVENOUS | Status: AC | PRN
  Administered 2021-07-19: 80 mL via INTRAVENOUS

## 2021-07-19 SURGICAL SUPPLY — 17 items
BAG URO CATCHER STRL LF (MISCELLANEOUS) ×2 IMPLANT
CATH INTERMIT  6FR 70CM (CATHETERS) ×2 IMPLANT
CLOTH BEACON ORANGE TIMEOUT ST (SAFETY) ×2 IMPLANT
GLOVE SURG ENC MOIS LTX SZ6.5 (GLOVE) ×2 IMPLANT
GLOVE SURG ENC MOIS LTX SZ7 (GLOVE) ×2 IMPLANT
GLOVE SURG ENC MOIS LTX SZ7.5 (GLOVE) ×2 IMPLANT
GLOVE SURG LTX SZ7.5 (GLOVE) ×2 IMPLANT
GOWN STRL REUS W/ TWL XL LVL3 (GOWN DISPOSABLE) ×1 IMPLANT
GOWN STRL REUS W/TWL XL LVL3 (GOWN DISPOSABLE) ×3 IMPLANT
GUIDEWIRE ANG ZIPWIRE 038X150 (WIRE) ×2 IMPLANT
GUIDEWIRE STR DUAL SENSOR (WIRE) ×4 IMPLANT
KIT TURNOVER KIT A (KITS) ×2 IMPLANT
MANIFOLD NEPTUNE II (INSTRUMENTS) ×2 IMPLANT
PACK CYSTO (CUSTOM PROCEDURE TRAY) ×2 IMPLANT
STENT URET 6FRX24 CONTOUR (STENTS) ×2 IMPLANT
TUBING CONNECTING 10 (TUBING) ×2 IMPLANT
TUBING UROLOGY SET (TUBING) ×2 IMPLANT

## 2021-07-19 NOTE — H&P (View-Only) (Signed)
H&P Physician requesting consult: Gerald Stabs Tegeler  Chief Complaint: Right ureteral stone, sepsis  History of Present Illness: Patient presented to the emergency department with nausea and vomiting and right-sided pain.  She is currently being treated for a urinary tract infection with ciprofloxacin.  She has not been eating well for the past week and has had chills.  In the emergency department she was found to have leukocytosis with a white blood cell count of 19.  Lactic acid was 2.1.  Creatinine 0.91.  She had a temperature of 100.5 rectal and was tachycardic.  She underwent a CT scan of the abdomen and pelvis that revealed moderate to severe right-sided hydronephrosis down to the level of a 10 mm stone in the proximal right ureter.  She had a nonobstructing stone in the left kidney.  A small right pleural effusion.  She currently does not feel well.  She is having some severe pain as well.  Past Medical History:  Diagnosis Date   Adenomatous colon polyp 2008   Anemia    Anxiety    Arthritis    knees   Blood transfusion 03/2011   Blood transfusion without reported diagnosis    Closed fracture of left distal radius    Closed fracture of right distal radius 04/17/2020   Closed intertrochanteric fracture of hip, right, initial encounter ([place]) 04/17/2020   Colon polyps    Constipation    COPD (chronic obstructive pulmonary disease) ([place])    Family history of anesthesia complication    " my son aspirated "   Femur fracture, right ([place]) 2015   GERD (gastroesophageal reflux disease)    Heartburn    High cholesterol    History of right radius fracture 04/2020   History of shingles    History of vertebral fracture    Hyperlipidemia    Hypertension    Insomnia    Left Radius fracture 04/2014   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   Recurrent falls 03/27/2021   Resides in long term care facility 03/27/2021   Rosacea    Past  Surgical History:  Procedure Laterality Date   [place] IM NAIL Right 05/01/2014   Procedure: INTRAMEDULLARY (IM) RETROGRADE FEMORAL NAILING;  Surgeon: Johnny Bridge, MD;  Location: [facility];  Service: Orthopedics;  Laterality: Right;   FEMUR IM NAIL Right 04/19/2020   Procedure: REMOVAL OF BIOMET RETROGRADE NAIL;  Surgeon: Altamese Farmington, MD;  Location: [facility];  Service: Orthopedics;  Laterality: Right;   FEMUR IM NAIL Right 04/19/2020   Procedure: INSERTION OF ANTEGRADE INTRAMEDULLARY (IM) NAIL FEMORAL;  Surgeon: Altamese Stephenville, MD;  Location: [facility];  Service: Orthopedics;  Laterality: Right;   FRACTURE SURGERY     HIP ARTHROPLASTY Right    INTRAMEDULLARY (IM) NAIL INTERTROCHANTERIC Left 03/27/2021   Procedure: INTRAMEDULLARY (IM) NAIL INTERTROCHANTRIC;  Surgeon: Shona Needles, MD;  Location: [place];  Service: Orthopedics;  Laterality: Left;   JOINT REPLACEMENT     LUMBAR FUSION     ORIF WRIST FRACTURE Right 04/19/2020   Procedure: Open Reduction Internal Fixation (Orif) Wrist Fracture;  Surgeon: Altamese Baxter, MD;  Location: [place] [facility];  Service: Orthopedics;  Laterality: Right;   ORIF WRIST FRACTURE Left 03/27/2021   Procedure: OPEN REDUCTION INTERNAL FIXATION (ORIF) WRIST FRACTURE;  Surgeon: Shona Needles, MD;  Location: [facility];  Service: Orthopedics;  Laterality: Left;   POLYPECTOMY     REPLACEMENT TOTAL KNEE BILATERAL  2011 & 2012   REVERSE SHOULDER ARTHROPLASTY Left 03/30/2021   Procedure: REVERSE SHOULDER ARTHROPLASTY;  Surgeon: Nicholes Stairs, MD;  Location: [place] [facility];  Service: Orthopedics;  Laterality: Left;   TONSILLECTOMY     VERTEBROPLASTY     Lumbar vertebra   WRIST SURGERY Bilateral     Home Medications:  (Not in a hospital admission)  Allergies:  Allergies  Allergen Reactions   Morphine And Related Nausea Only    Family History  Problem Relation Age of Onset   COPD Father    Asthma Father    Asthma Sister     Colon cancer Neg Hx    Stomach cancer Neg Hx    Social History:  reports that she has never smoked. She has never used smokeless tobacco. She reports previous alcohol use. She reports previous drug use.  ROS: A complete review of systems was performed.  All systems are negative except for pertinent findings as noted. ROS   Physical Exam:  Vital signs in last 24 hours: Temp:  [99.9 F (37.7 C)-100.5 F (38.1 C)] 100.5 F (38.1 C) (08/02 2055) Pulse Rate:  [87-113] 88 (08/02 2300) Resp:  [17-20] 19 (08/02 2300) BP: (126-149)/(73-92) 126/75 (08/02 2300) SpO2:  [91 %-93 %] 91 % (08/02 2300) Weight:  [47.6 kg] 47.6 kg (08/02 1945) General: Drowsy but oriented, No acute distress but appears quite fatigued like she does not feel well HEENT: Normocephalic, atraumatic Neck: No JVD or lymphadenopathy Cardiovascular: Regular rate and rhythm Lungs: Regular rate and effort Abdomen: Soft, nontender, nondistended, no abdominal masses Back: No CVA tenderness Extremities: No edema Neurologic: Grossly intact  Laboratory Data:  Results for orders placed or performed during the hospital encounter of 07/19/21 (from the past 24 hour(s))  CBC with Differential     Status: Abnormal   Collection Time: 07/19/21  8:13 PM  Result Value Ref Range   WBC 19.0 (H) 4.0 - 10.5 K/uL   RBC 4.20 3.87 - 5.11 MIL/uL   Hemoglobin 13.4 12.0 - 15.0 g/dL   HCT 38.9 36.0 - 46.0 %   MCV 92.6 80.0 - 100.0 fL   MCH 31.9 26.0 - 34.0 pg   MCHC 34.4 30.0 - 36.0 g/dL   RDW 14.2 11.5 - 15.5 %   Platelets 166 150 - 400 K/uL   nRBC 0.0 0.0 - 0.2 %   Neutrophils Relative % 87 %   Neutro Abs 16.3 (H) 1.7 - 7.7 K/uL   Lymphocytes Relative 3 %   Lymphs Abs 0.6 (L) 0.7 - 4.0 K/uL   Monocytes Relative 9 %   Monocytes Absolute 1.8 (H) 0.1 - 1.0 K/uL   Eosinophils Relative 0 %   Eosinophils Absolute 0.0 0.0 - 0.5 K/uL   Basophils Relative 0 %   Basophils Absolute 0.1 0.0 - 0.1 K/uL   Immature Granulocytes 1 %   Abs  Immature Granulocytes 0.25 (H) 0.00 - 0.07 K/uL  Comprehensive metabolic panel     Status: Abnormal   Collection Time: 07/19/21  8:13 PM  Result Value Ref Range   Sodium 137 135 - 145 mmol/L   Potassium 2.5 (LL) 3.5 - 5.1 mmol/L   Chloride 101 98 - 111 mmol/L   CO2 24 22 - 32 mmol/L   Glucose, Bld 132 (H) 70 - 99 mg/dL   BUN 18 8 - 23 mg/dL   Creatinine, Ser  0.91 0.44 - 1.00 mg/dL   Calcium 9.3 8.9 - 10.3 mg/dL   Total Protein 6.2 (L) 6.5 - 8.1 g/dL   Albumin 2.7 (L) 3.5 - 5.0 g/dL   AST 53 (H) 15 - 41 U/L   ALT 38 0 - 44 U/L   Alkaline Phosphatase 96 38 - 126 U/L   Total Bilirubin 1.0 0.3 - 1.2 mg/dL   GFR, Estimated >60 >60 mL/min   Anion gap 12 5 - 15  Lactic acid, plasma     Status: Abnormal   Collection Time: 07/19/21  8:13 PM  Result Value Ref Range   Lactic Acid, Venous 2.1 (HH) 0.5 - 1.9 mmol/L  Lipase, blood     Status: None   Collection Time: 07/19/21  8:13 PM  Result Value Ref Range   Lipase 29 11 - 51 U/L  Resp Panel by RT-PCR (Flu A&B, Covid) Nasopharyngeal Swab     Status: None   Collection Time: 07/19/21  8:14 PM   Specimen: Nasopharyngeal Swab; Nasopharyngeal(NP) swabs in vial transport medium  Result Value Ref Range   SARS Coronavirus 2 by RT PCR NEGATIVE NEGATIVE   Influenza A by PCR NEGATIVE NEGATIVE   Influenza B by PCR NEGATIVE NEGATIVE  Urinalysis, Routine w reflex microscopic     Status: Abnormal   Collection Time: 07/19/21 10:18 PM  Result Value Ref Range   Color, Urine YELLOW YELLOW   APPearance HAZY (A) CLEAR   Specific Gravity, Urine 1.018 1.005 - 1.030   pH 6.0 5.0 - 8.0   Glucose, UA NEGATIVE NEGATIVE mg/dL   Hgb urine dipstick NEGATIVE NEGATIVE   Bilirubin Urine NEGATIVE NEGATIVE   Ketones, ur NEGATIVE NEGATIVE mg/dL   Protein, ur NEGATIVE NEGATIVE mg/dL   Nitrite NEGATIVE NEGATIVE   Leukocytes,Ua LARGE (A) NEGATIVE   RBC / HPF 6-10 0 - 5 RBC/hpf   WBC, UA >50 (H) 0 - 5 WBC/hpf   Bacteria, UA RARE (A) NONE SEEN   Squamous Epithelial /  LPF 0-5 0 - 5   Hyaline Casts, UA PRESENT   Lactic acid, plasma     Status: None   Collection Time: 07/19/21 10:25 PM  Result Value Ref Range   Lactic Acid, Venous 1.2 0.5 - 1.9 mmol/L   Recent Results (from the past 240 hour(s))  Resp Panel by RT-PCR (Flu A&B, Covid) Nasopharyngeal Swab     Status: None   Collection Time: 07/19/21  8:14 PM   Specimen: Nasopharyngeal Swab; Nasopharyngeal(NP) swabs in vial transport medium  Result Value Ref Range Status   SARS Coronavirus 2 by RT PCR NEGATIVE NEGATIVE Final    Comment: (NOTE) SARS-CoV-2 target nucleic acids are NOT DETECTED.  The SARS-CoV-2 RNA is generally detectable in upper respiratory specimens during the acute phase of infection. The lowest concentration of SARS-CoV-2 viral copies this assay can detect is 138 copies/mL. A negative result does not preclude SARS-Cov-2 infection and should not be used as the sole basis for treatment or other patient management decisions. A negative result may occur with  improper specimen collection/handling, submission of specimen other than nasopharyngeal swab, presence of viral mutation(s) within the areas targeted by this assay, and inadequate number of viral copies(<138 copies/mL). A negative result must be combined with clinical observations, patient history, and epidemiological information. The expected result is Negative.  Fact Sheet for Patients:  EntrepreneurPulse.com.au  Fact Sheet for Healthcare Providers:  IncredibleEmployment.be  This test is no t yet approved or cleared by the [place] and  has been authorized for detection and/or diagnosis of SARS-CoV-2 by FDA under an Emergency Use Authorization (EUA). This EUA will remain  in effect (meaning this test can be used) for the duration of the COVID-19 declaration under Section 564(b)(1) of the Act, 21 U.S.C.section 360bbb-3(b)(1), unless the authorization is terminated  or revoked  sooner.       Influenza A by PCR NEGATIVE NEGATIVE Final   Influenza B by PCR NEGATIVE NEGATIVE Final    Comment: (NOTE) The Xpert Xpress SARS-CoV-2/FLU/RSV plus assay is intended as an aid in the diagnosis of influenza from Nasopharyngeal swab specimens and should not be used as a sole basis for treatment. Nasal washings and aspirates are unacceptable for Xpert Xpress SARS-CoV-2/FLU/RSV testing.  Fact Sheet for Patients: EntrepreneurPulse.com.au  Fact Sheet for Healthcare Providers: IncredibleEmployment.be  This test is not yet approved or cleared by the [place] FDA and has been authorized for detection and/or diagnosis of SARS-CoV-2 by FDA under an Emergency Use Authorization (EUA). This EUA will remain in effect (meaning this test can be used) for the duration of the COVID-19 declaration under Section 564(b)(1) of the Act, 21 U.S.C. section 360bbb-3(b)(1), unless the authorization is terminated or revoked.  Performed at [facility], [place] [address] [place], [place] [address]    Creatinine: Recent Labs    07/19/21 2013  CREATININE 0.91   CT scan personally reviewed and is detailed in the history of present illness  Impression/Assessment:  Right ureteral calculus Ureteral obstruction secondary to calculus Sepsis secondary to urinary tract infection  Plan:  Plan for emergent right ureteral stent placement.  Risk and benefits discussed.  She is a DO NOT RESUSCITATE but we discussed the need for intubation for the procedure and a possible risk of needing to keep her intubated if she has a severe inflammatory response to the stent placement.  She did decline any chest compressions if something were to happen.  Marton Redwood, III 07/19/2021, 11:31 PM

## 2021-07-19 NOTE — Consult Note (Signed)
H&P Physician requesting consult: Gerald Stabs Tegeler  Chief Complaint: Right ureteral stone, sepsis  History of Present Illness: Patient presented to the emergency department with nausea and vomiting and right-sided pain.  She is currently being treated for a urinary tract infection with ciprofloxacin.  She has not been eating well for the past week and has had chills.  In the emergency department she was found to have leukocytosis with a white blood cell count of 19.  Lactic acid was 2.1.  Creatinine 0.91.  She had a temperature of 100.5 rectal and was tachycardic.  She underwent a CT scan of the abdomen and pelvis that revealed moderate to severe right-sided hydronephrosis down to the level of a 10 mm stone in the proximal right ureter.  She had a nonobstructing stone in the left kidney.  A small right pleural effusion.  She currently does not feel well.  She is having some severe pain as well.  Past Medical History:  Diagnosis Date   Adenomatous colon polyp 2008   Anemia    Anxiety    Arthritis    knees   Blood transfusion 03/2011   Blood transfusion without reported diagnosis    Closed fracture of left distal radius    Closed fracture of right distal radius 04/17/2020   Closed intertrochanteric fracture of hip, right, initial encounter ([place]) 04/17/2020   Colon polyps    Constipation    COPD (chronic obstructive pulmonary disease) ([place])    Family history of anesthesia complication    " my son aspirated "   Femur fracture, right ([place]) 2015   GERD (gastroesophageal reflux disease)    Heartburn    High cholesterol    History of right radius fracture 04/2020   History of shingles    History of vertebral fracture    Hyperlipidemia    Hypertension    Insomnia    Left Radius fracture 04/2014   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   Recurrent falls 03/27/2021   Resides in long term care facility 03/27/2021   Rosacea    Past  Surgical History:  Procedure Laterality Date   [place] IM NAIL Right 05/01/2014   Procedure: INTRAMEDULLARY (IM) RETROGRADE FEMORAL NAILING;  Surgeon: Johnny Bridge, MD;  Location: [facility];  Service: Orthopedics;  Laterality: Right;   FEMUR IM NAIL Right 04/19/2020   Procedure: REMOVAL OF BIOMET RETROGRADE NAIL;  Surgeon: Altamese Savannah, MD;  Location: [facility];  Service: Orthopedics;  Laterality: Right;   FEMUR IM NAIL Right 04/19/2020   Procedure: INSERTION OF ANTEGRADE INTRAMEDULLARY (IM) NAIL FEMORAL;  Surgeon: Altamese [place], MD;  Location: [facility];  Service: Orthopedics;  Laterality: Right;   FRACTURE SURGERY     HIP ARTHROPLASTY Right    INTRAMEDULLARY (IM) NAIL INTERTROCHANTERIC Left 03/27/2021   Procedure: INTRAMEDULLARY (IM) NAIL INTERTROCHANTRIC;  Surgeon: Shona Needles, MD;  Location: [facility];  Service: Orthopedics;  Laterality: Left;   JOINT REPLACEMENT     LUMBAR FUSION     ORIF WRIST FRACTURE Right 04/19/2020   Procedure: Open Reduction Internal Fixation (Orif) Wrist Fracture;  Surgeon: Altamese Oildale, MD;  Location: [facility];  Service: Orthopedics;  Laterality: Right;   ORIF WRIST FRACTURE Left 03/27/2021   Procedure: OPEN REDUCTION INTERNAL FIXATION (ORIF) WRIST FRACTURE;  Surgeon: Shona Needles, MD;  Location: [facility];  Service: Orthopedics;  Laterality: Left;   POLYPECTOMY     REPLACEMENT TOTAL KNEE BILATERAL  2011 & 2012   REVERSE SHOULDER ARTHROPLASTY Left 03/30/2021   Procedure: REVERSE SHOULDER ARTHROPLASTY;  Surgeon: Nicholes Stairs, MD;  Location: [place];  Service: Orthopedics;  Laterality: Left;   TONSILLECTOMY     VERTEBROPLASTY     Lumbar vertebra   WRIST SURGERY Bilateral     Home Medications:  (Not in a hospital admission)  Allergies:  Allergies  Allergen Reactions   Morphine And Related Nausea Only    Family History  Problem Relation Age of Onset   COPD Father    Asthma Father    Asthma Sister     Colon cancer Neg Hx    Stomach cancer Neg Hx    Social History:  reports that she has never smoked. She has never used smokeless tobacco. She reports previous alcohol use. She reports previous drug use.  ROS: A complete review of systems was performed.  All systems are negative except for pertinent findings as noted. ROS   Physical Exam:  Vital signs in last 24 hours: Temp:  [99.9 F (37.7 C)-100.5 F (38.1 C)] 100.5 F (38.1 C) (08/02 2055) Pulse Rate:  [87-113] 88 (08/02 2300) Resp:  [17-20] 19 (08/02 2300) BP: (126-149)/(73-92) 126/75 (08/02 2300) SpO2:  [91 %-93 %] 91 % (08/02 2300) Weight:  [47.6 kg] 47.6 kg (08/02 1945) General: Drowsy but oriented, No acute distress but appears quite fatigued like she does not feel well HEENT: Normocephalic, atraumatic Neck: No JVD or lymphadenopathy Cardiovascular: Regular rate and rhythm Lungs: Regular rate and effort Abdomen: Soft, nontender, nondistended, no abdominal masses Back: No CVA tenderness Extremities: No edema Neurologic: Grossly intact  Laboratory Data:  Results for orders placed or performed during the hospital encounter of 07/19/21 (from the past 24 hour(s))  CBC with Differential     Status: Abnormal   Collection Time: 07/19/21  8:13 PM  Result Value Ref Range   WBC 19.0 (H) 4.0 - 10.5 K/uL   RBC 4.20 3.87 - 5.11 MIL/uL   Hemoglobin 13.4 12.0 - 15.0 g/dL   HCT 38.9 36.0 - 46.0 %   MCV 92.6 80.0 - 100.0 fL   MCH 31.9 26.0 - 34.0 pg   MCHC 34.4 30.0 - 36.0 g/dL   RDW 14.2 11.5 - 15.5 %   Platelets 166 150 - 400 K/uL   nRBC 0.0 0.0 - 0.2 %   Neutrophils Relative % 87 %   Neutro Abs 16.3 (H) 1.7 - 7.7 K/uL   Lymphocytes Relative 3 %   Lymphs Abs 0.6 (L) 0.7 - 4.0 K/uL   Monocytes Relative 9 %   Monocytes Absolute 1.8 (H) 0.1 - 1.0 K/uL   Eosinophils Relative 0 %   Eosinophils Absolute 0.0 0.0 - 0.5 K/uL   Basophils Relative 0 %   Basophils Absolute 0.1 0.0 - 0.1 K/uL   Immature Granulocytes 1 %   Abs  Immature Granulocytes 0.25 (H) 0.00 - 0.07 K/uL  Comprehensive metabolic panel     Status: Abnormal   Collection Time: 07/19/21  8:13 PM  Result Value Ref Range   Sodium 137 135 - 145 mmol/L   Potassium 2.5 (LL) 3.5 - 5.1 mmol/L   Chloride 101 98 - 111 mmol/L   CO2 24 22 - 32 mmol/L   Glucose, Bld 132 (H) 70 - 99 mg/dL   BUN 18 8 - 23 mg/dL   Creatinine, Ser  0.91 0.44 - 1.00 mg/dL   Calcium 9.3 8.9 - 10.3 mg/dL   Total Protein 6.2 (L) 6.5 - 8.1 g/dL   Albumin 2.7 (L) 3.5 - 5.0 g/dL   AST 53 (H) 15 - 41 U/L   ALT 38 0 - 44 U/L   Alkaline Phosphatase 96 38 - 126 U/L   Total Bilirubin 1.0 0.3 - 1.2 mg/dL   GFR, Estimated >60 >60 mL/min   Anion gap 12 5 - 15  Lactic acid, plasma     Status: Abnormal   Collection Time: 07/19/21  8:13 PM  Result Value Ref Range   Lactic Acid, Venous 2.1 (HH) 0.5 - 1.9 mmol/L  Lipase, blood     Status: None   Collection Time: 07/19/21  8:13 PM  Result Value Ref Range   Lipase 29 11 - 51 U/L  Resp Panel by RT-PCR (Flu A&B, Covid) Nasopharyngeal Swab     Status: None   Collection Time: 07/19/21  8:14 PM   Specimen: Nasopharyngeal Swab; Nasopharyngeal(NP) swabs in vial transport medium  Result Value Ref Range   SARS Coronavirus 2 by RT PCR NEGATIVE NEGATIVE   Influenza A by PCR NEGATIVE NEGATIVE   Influenza B by PCR NEGATIVE NEGATIVE  Urinalysis, Routine w reflex microscopic     Status: Abnormal   Collection Time: 07/19/21 10:18 PM  Result Value Ref Range   Color, Urine YELLOW YELLOW   APPearance HAZY (A) CLEAR   Specific Gravity, Urine 1.018 1.005 - 1.030   pH 6.0 5.0 - 8.0   Glucose, UA NEGATIVE NEGATIVE mg/dL   Hgb urine dipstick NEGATIVE NEGATIVE   Bilirubin Urine NEGATIVE NEGATIVE   Ketones, ur NEGATIVE NEGATIVE mg/dL   Protein, ur NEGATIVE NEGATIVE mg/dL   Nitrite NEGATIVE NEGATIVE   Leukocytes,Ua LARGE (A) NEGATIVE   RBC / HPF 6-10 0 - 5 RBC/hpf   WBC, UA >50 (H) 0 - 5 WBC/hpf   Bacteria, UA RARE (A) NONE SEEN   Squamous Epithelial /  LPF 0-5 0 - 5   Hyaline Casts, UA PRESENT   Lactic acid, plasma     Status: None   Collection Time: 07/19/21 10:25 PM  Result Value Ref Range   Lactic Acid, Venous 1.2 0.5 - 1.9 mmol/L   Recent Results (from the past 240 hour(s))  Resp Panel by RT-PCR (Flu A&B, Covid) Nasopharyngeal Swab     Status: None   Collection Time: 07/19/21  8:14 PM   Specimen: Nasopharyngeal Swab; Nasopharyngeal(NP) swabs in vial transport medium  Result Value Ref Range Status   SARS Coronavirus 2 by RT PCR NEGATIVE NEGATIVE Final    Comment: (NOTE) SARS-CoV-2 target nucleic acids are NOT DETECTED.  The SARS-CoV-2 RNA is generally detectable in upper respiratory specimens during the acute phase of infection. The lowest concentration of SARS-CoV-2 viral copies this assay can detect is 138 copies/mL. A negative result does not preclude SARS-Cov-2 infection and should not be used as the sole basis for treatment or other patient management decisions. A negative result may occur with  improper specimen collection/handling, submission of specimen other than nasopharyngeal swab, presence of viral mutation(s) within the areas targeted by this assay, and inadequate number of viral copies(<138 copies/mL). A negative result must be combined with clinical observations, patient history, and epidemiological information. The expected result is Negative.  Fact Sheet for Patients:  EntrepreneurPulse.com.au  Fact Sheet for Healthcare Providers:  IncredibleEmployment.be  This test is no t yet approved or cleared by the [place] and  has been authorized for detection and/or diagnosis of SARS-CoV-2 by FDA under an Emergency Use Authorization (EUA). This EUA will remain  in effect (meaning this test can be used) for the duration of the COVID-19 declaration under Section 564(b)(1) of the Act, 21 U.S.C.section 360bbb-3(b)(1), unless the authorization is terminated  or revoked  sooner.       Influenza A by PCR NEGATIVE NEGATIVE Final   Influenza B by PCR NEGATIVE NEGATIVE Final    Comment: (NOTE) The Xpert Xpress SARS-CoV-2/FLU/RSV plus assay is intended as an aid in the diagnosis of influenza from Nasopharyngeal swab specimens and should not be used as a sole basis for treatment. Nasal washings and aspirates are unacceptable for Xpert Xpress SARS-CoV-2/FLU/RSV testing.  Fact Sheet for Patients: EntrepreneurPulse.com.au  Fact Sheet for Healthcare Providers: IncredibleEmployment.be  This test is not yet approved or cleared by the [place] FDA and has been authorized for detection and/or diagnosis of SARS-CoV-2 by FDA under an Emergency Use Authorization (EUA). This EUA will remain in effect (meaning this test can be used) for the duration of the COVID-19 declaration under Section 564(b)(1) of the Act, 21 U.S.C. section 360bbb-3(b)(1), unless the authorization is terminated or revoked.  Performed at [facility], [place] [address], [place], [place] [address]    Creatinine: Recent Labs    07/19/21 2013  CREATININE 0.91   CT scan personally reviewed and is detailed in the history of present illness  Impression/Assessment:  Right ureteral calculus Ureteral obstruction secondary to calculus Sepsis secondary to urinary tract infection  Plan:  Plan for emergent right ureteral stent placement.  Risk and benefits discussed.  She is a DO NOT RESUSCITATE but we discussed the need for intubation for the procedure and a possible risk of needing to keep her intubated if she has a severe inflammatory response to the stent placement.  She did decline any chest compressions if something were to happen.  Marton Redwood, III 07/19/2021, 11:31 PM

## 2021-07-19 NOTE — ED Notes (Signed)
[facility] placed on pt  Will attempt to get U/A and culture from there

## 2021-07-19 NOTE — ED Notes (Signed)
Date and time results received: 07/19/21 21:13   Test: Potassium  Critical Value: 2.5  Name of Provider Notified: Dr. Sherry Ruffing

## 2021-07-19 NOTE — ED Notes (Signed)
OR called. PACU ready for patient.

## 2021-07-19 NOTE — Op Note (Signed)
Operative Note  Preoperative diagnosis:  1.  Right ureteral calculus with sepsis secondary to UTI  Post operative diagnosis: 1.  Right ureteral calculus with sepsis secondary to UTI  Procedure(s): 1.  Cystoscopy with right retrograde pyelogram and right ureteral stent placement  Surgeon: Link Snuffer, MD  Assistants: None  Anesthesia: General  Complications: None immediate  EBL: Minimal  Specimens: 1.  Urine culture  Drains/Catheters: 1.  6 X 24 double-J ureteral stent  Intraoperative findings: 1.  Normal urethra and bladder 2.  Right retrograde pyelogram revealed a filling defect at the level of the stone with upstream hydroureteronephrosis 3.  Large amount of purulent efflux from the right ureter after relief of obstruction  Indication: 87 year old female with a proximal right ureteral calculus and sepsis secondary to urinary tract infection presents for emergent stent placement.  Description of procedure:  The patient was identified and consent was obtained.  The patient was taken to the operating room and placed in the supine position.  The patient was placed under general anesthesia.  Perioperative antibiotics were administered.  The patient was placed in dorsal lithotomy.  Patient was prepped and draped in a standard sterile fashion and a timeout was performed.  A 21 French rigid cystoscope was advanced into the urethra and into the bladder.  The right distal most portion of the ureter was cannulated with an open-ended ureteral catheter.  Retrograde pyelogram was performed with the findings noted above.  A sensor wire was then advanced up t up the ureter under fluoroscopic guidance.  There was some tortuosity of the ureter in the proximal ureter and the wire met resistance at this area which was the area of stone impaction as well.  I advanced the open-ended ureteral catheter up the ureter little bit.  I then remove the wire and advanced a Glidewire up the ureter.  This was  able to be navigated into the kidney.  The ureter straightened up and I was able to advance the open-ended ureteral catheter over the wire and into the renal pelvis.  I withdrew the wire and shot another retrograde pyelogram to confirm we were in the renal pelvis.  This was confirmed.  I then advanced a sensor wire through the open-ended ureteral catheter and into the kidney and withdrew the open-ended ureteral catheter.  A 6 X 24 double-J ureteral stent was advanced up to the kidney under fluoroscopic guidance.  The wire was withdrawn and fluoroscopy confirmed good proximal placement and direct visualization confirmed a good coil within the bladder.  There was a large amount of purulent efflux and therefore a culture was obtained through the cystoscope.  The bladder was drained and the scope withdrawn.  This concluded the operation.  Patient tolerated procedure well and was stable postoperatively.  Plan: Continue antibiotics.  Follow-up cultures.  She will need definitive management of her stone in a couple of weeks after an appropriate course of antibiotic treatment

## 2021-07-19 NOTE — H&P (Signed)
History and Physical    DER FEI [id number] DOB: 01/09/1931 DOA: 07/19/2021  PCP: Pcp, No  Patient coming from: [facility]  I have personally briefly reviewed patient's old medical records in [place]  Chief Complaint: Nausea, vomiting right-sided abdominal pain  HPI: [name] is a 87 y.o. female with medical history significant for hypertension, history of multiple fractures, COPD, GERD and hyperlipidemia who presents with concerns of nausea, vomiting and increasing right-sided abdominal pain.  For the past week she has noticed increasing nausea, occasional vomiting and decreased appetite with abdominal pain.  On documentation, she was noted to have been started on Augmentin last week and also given IV fluids. However she did not have much improvement in her symptoms and continues to have worsening right-sided abdominal pain.  Also notes headache.  Denies any coughing, shortness of breath or chest pain.  ED Course: She was febrile up to 100.5, tachycardic up to 113 but normotensive on room air.  Chest x-ray showed right-sided opacity concerning for infection versus atelectasis.  CT of the abdomen showing right hydronephrosis/hydroureter with 10 mm stone.  UA pending.  WBC of 19, Lact of 2.1. K OF 2.5. Creatinine is normal.   ED physician discussed with urology Dr. Gloriann Loan who emergently taken for stent placement tonight.  Hospitalist on-call for admission.  Review of Systems: Constitutional: No Weight Change, + Fever ENT/Mouth: No sore throat, No Rhinorrhea Eyes: No Eye Pain, No Vision Changes Cardiovascular: No Chest Pain, no SOB Respiratory: No Cough, No Sputum, No Wheezing, no Dyspnea  Gastrointestinal: + Nausea, + Vomiting, No Diarrhea, No Constipation, + Pain Genitourinary: no Urinary Incontinence, No Urgency, No Flank Pain Musculoskeletal: No Arthralgias, No Myalgias Skin: No Skin Lesions, No Pruritus, Neuro: no Weakness, No Numbness Psych: No  Anxiety/Panic, No Depression, no decrease appetite Heme/Lymph: No Bruising, No Bleeding  Past Medical History:  Diagnosis Date   Adenomatous colon polyp 2008   Anemia    Anxiety    Arthritis    knees   Blood transfusion 03/2011   Blood transfusion without reported diagnosis    Closed fracture of left distal radius    Closed fracture of right distal radius 04/17/2020   Closed intertrochanteric fracture of hip, right, initial encounter ([place]) 04/17/2020   Colon polyps    Constipation    COPD (chronic obstructive pulmonary disease) (HCC)    Family history of anesthesia complication    " my son aspirated "   Femur fracture, right ([place]) 2015   GERD (gastroesophageal reflux disease)    Heartburn    High cholesterol    History of right radius fracture 04/2020   History of shingles    History of vertebral fracture    Hyperlipidemia    Hypertension    Insomnia    Left Radius fracture 04/2014   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   Recurrent falls 03/27/2021   Resides in long term care facility 03/27/2021   Rosacea     Past Surgical History:  Procedure Laterality Date   [place] IM NAIL Right 05/01/2014   Procedure: INTRAMEDULLARY (IM) RETROGRADE FEMORAL NAILING;  Surgeon: Johnny Bridge, MD;  Location: [facility];  Service: Orthopedics;  Laterality: Right;   FEMUR IM NAIL Right 04/19/2020   Procedure: REMOVAL OF BIOMET RETROGRADE NAIL;  Surgeon: Altamese [place], MD;  Location: [facility];  Service: Orthopedics;  Laterality: Right;   FEMUR IM NAIL Right 04/19/2020   Procedure: INSERTION OF ANTEGRADE INTRAMEDULLARY (IM) NAIL FEMORAL;  Surgeon: Altamese Wortham, MD;  Location: [facility];  Service: Orthopedics;  Laterality: Right;   FRACTURE SURGERY     HIP ARTHROPLASTY Right    INTRAMEDULLARY (IM) NAIL INTERTROCHANTERIC Left 03/27/2021   Procedure: INTRAMEDULLARY (IM) NAIL  INTERTROCHANTRIC;  Surgeon: Shona Needles, MD;  Location: [facility];  Service: Orthopedics;  Laterality: Left;   JOINT REPLACEMENT     LUMBAR FUSION     ORIF WRIST FRACTURE Right 04/19/2020   Procedure: Open Reduction Internal Fixation (Orif) Wrist Fracture;  Surgeon: Altamese Loudon, MD;  Location: [facility];  Service: Orthopedics;  Laterality: Right;   ORIF WRIST FRACTURE Left 03/27/2021   Procedure: OPEN REDUCTION INTERNAL FIXATION (ORIF) WRIST FRACTURE;  Surgeon: Shona Needles, MD;  Location: [facility];  Service: Orthopedics;  Laterality: Left;   POLYPECTOMY     REPLACEMENT TOTAL KNEE BILATERAL  2011 & 2012   REVERSE SHOULDER ARTHROPLASTY Left 03/30/2021   Procedure: REVERSE SHOULDER ARTHROPLASTY;  Surgeon: Nicholes Stairs, MD;  Location: [facility];  Service: Orthopedics;  Laterality: Left;   TONSILLECTOMY     VERTEBROPLASTY     Lumbar vertebra   WRIST SURGERY Bilateral      reports that she has never smoked. She has never used smokeless tobacco. She reports previous alcohol use. She reports previous drug use. Social History  Allergies  Allergen Reactions   Morphine And Related Nausea Only    Family History  Problem Relation Age of Onset   COPD Father    Asthma Father    Asthma Sister    Colon cancer Neg Hx    Stomach cancer Neg Hx      Prior to Admission medications   Medication Sig Start Date End Date Taking? Authorizing Provider  acetaminophen (TYLENOL) 325 MG tablet Take 325 mg by mouth every 4 (four) hours as needed for moderate pain.    [provider]  acetaminophen (TYLENOL) 500 MG tablet Take 1 tablet (500 mg total) by mouth every 12 (twelve) hours. 05/01/20   Alma Friendly, MD  albuterol (PROVENTIL) (2.5 MG/3ML) 0.083% nebulizer solution Take 2.5 mg by nebulization every 4 (four) hours as needed for wheezing or shortness of breath.    [provider]  alum & mag hydroxide-simeth (MAALOX PLUS) 400-400-40 MG/5ML suspension Take 30 mLs by mouth as  directed. Give 30 ml once a week on Mondays & Give 30 mls by mouth every 6 hours PRN for Indigestion, Heartburn, Gas, Nausea    [provider]  amLODipine (NORVASC) 5 MG tablet Take 5 mg by mouth daily.     [provider]  amLODipine (NORVASC) 5 MG tablet Take 1 tablet by mouth daily. 03/24/21   [provider]  ascorbic acid (VITAMIN C) 500 MG tablet Take 1 tablet (500 mg total) by mouth daily. 05/01/20   Alma Friendly, MD  BIOTIN PO Take 1 tablet by mouth daily with lunch.    [provider]  bisacodyl (DULCOLAX) 5 MG EC tablet Take 5 mg by mouth daily as needed for moderate constipation.    [provider]  calcitonin, salmon, (MIACALCIN/FORTICAL) 200 UNIT/ACT nasal spray Place 1 spray into alternate nostrils daily.    [provider]  Calcium Carb-Cholecalciferol (CALCIUM+D3 PO) Take 1 tablet by mouth daily with lunch.    [provider]  calcium carbonate (TUMS - DOSED IN MG ELEMENTAL CALCIUM) 500 MG chewable tablet Chew 1 tablet by mouth every 8 (eight) hours as needed for indigestion.    [provider]  calcium-vitamin D (OSCAL WITH D) 500-200 MG-UNIT tablet Take 1 tablet by mouth.    [provider]  Carboxymethylcellulose Sodium (THERATEARS OP) Place 1 drop into both eyes daily.    [provider]  diphenhydramine-acetaminophen (TYLENOL PM) 25-500 MG TABS tablet Take 1 tablet by mouth at bedtime as needed (sleep).    [provider]  docusate sodium (COLACE) 100 MG capsule Take 1 capsule (100 mg total) by mouth 2 (two) times daily. 05/01/20   Alma Friendly, MD  docusate sodium (COLACE) 100 MG capsule Take 100 mg by mouth 2 (two) times daily.    [provider]  enoxaparin (LOVENOX) 40 MG/0.4ML injection Inject 0.4 mLs (40 mg total) into the skin daily for 28 days. 04/23/20 05/21/20  Ainsley Spinner, PA-C  enoxaparin (LOVENOX) 40 MG/0.4ML injection Inject 0.4 mLs (40 mg total) into  the skin daily for 28 days. 04/01/21 04/29/21  Delray Alt, PA-C  ferrous sulfate 325 (65 FE) MG EC tablet Take 325 mg by mouth daily with breakfast.    [provider]  ferrous sulfate 325 (65 FE) MG tablet Take 1 tablet (325 mg total) by mouth daily with breakfast. 05/01/20 06/30/20  Alma Friendly, MD  HYDROcodone-acetaminophen (NORCO/VICODIN) 5-325 MG tablet Take 1 tablet by mouth every 6 (six) hours as needed for severe pain. 07/13/20   Robinson, [place] N, PA-C  Lido-PE-Glycerin-Petrolatum (PREPARATION H RAPID RELIEF) 5-0.25-14.4-15 % CREA Apply 1 application topically every 8 (eight) hours as needed (hemorrhoids).    [provider]  melatonin 3 MG TABS tablet Take 3 mg by mouth at bedtime as needed (sleep).    [provider]  Multiple Vitamin (MULTIVITAMIN ADULT) TABS Take 1 tablet by mouth daily.    [provider]  Multiple Vitamin (MULTIVITAMIN WITH MINERALS) TABS tablet Take 1 tablet by mouth daily with lunch.    [provider]  nitroGLYCERIN (NITROSTAT) 0.4 MG SL tablet Place 0.4 mg under the tongue every 5 (five) minutes as needed for chest pain.    [provider]  ondansetron (ZOFRAN) 4 MG tablet Take 4 mg by mouth daily as needed for nausea or vomiting.    [provider]  oxyCODONE (ROXICODONE) 5 MG immediate release tablet Take 1 tablet (5 mg total) by mouth every 6 (six) hours as needed for severe pain. 04/02/21   Benay Pike, MD  pantoprazole (PROTONIX) 40 MG tablet Take 40 mg by mouth daily. 03/18/20   [provider]  pantoprazole (PROTONIX) 40 MG tablet Take 1 tablet by mouth daily. 03/25/21   [provider]  polyethylene glycol (MIRALAX / GLYCOLAX) 17 g packet Take 17 g by mouth daily. 05/02/20   Alma Friendly, MD  polyethylene glycol (MIRALAX / GLYCOLAX) 17 g packet Take 17 g by mouth daily.    [provider]  POTASSIUM PO Take 1 tablet by mouth daily with lunch.     [provider]  simvastatin (ZOCOR) 20 MG tablet Take 20 mg by mouth at bedtime. 04/17/20   [provider]  simvastatin (ZOCOR) 20 MG tablet Take 20 mg by mouth at bedtime.    [provider]  tiZANidine (ZANAFLEX) 2 MG tablet Take 2 mg by mouth every 8 (eight) hours as needed for muscle spasms. 02/19/21  [provider]  vitamin C (ASCORBIC ACID) 500 MG tablet Take 500 mg by mouth daily.    [provider]  VITAMIN D PO Take 1 tablet by mouth daily with lunch.    [provider]    Physical Exam: Vitals:   07/19/21 2055 07/19/21 2100 07/19/21 2200 07/19/21 2300  BP:  126/78 127/73 126/75  Pulse:  94 87 88  Resp:  '19 20 19  '$ Temp: (!) 100.5 F (38.1 C)     TempSrc: Rectal     SpO2:  93% 91% 91%  Weight:      Height:        Constitutional: NAD, calm, fatigued ill appearing female laying in bed Vitals:   07/19/21 2055 07/19/21 2100 07/19/21 2200 07/19/21 2300  BP:  126/78 127/73 126/75  Pulse:  94 87 88  Resp:  '19 20 19  '$ Temp: (!) 100.5 F (38.1 C)     TempSrc: Rectal     SpO2:  93% 91% 91%  Weight:      Height:       Eyes: PERRL, lids and conjunctivae normal ENMT: Mucous membranes are moist.  Neck: normal, supple Respiratory: clear to auscultation bilaterally, no wheezing, no crackles. Normal respiratory effort. No accessory muscle use.  Cardiovascular: Regular rate and rhythm, no murmurs / rubs / gallops. No extremity edema.  Abdomen: right quadrant tenderness but no flank pain, no masses palpated. Bowel sounds positive.  Musculoskeletal: no clubbing / cyanosis. No joint deformity upper and lower extremities. Good ROM, no contractures. Normal muscle tone.  Skin: no rashes, lesions, ulcers. No induration Neurologic: CN 2-12 grossly intact. Sensation intact,  Strength 5/5 in all 4.  Psychiatric: Normal judgment and insight. Alert and oriented x 3. Normal mood.     Labs on Admission: I have personally reviewed following  labs and imaging studies  CBC: Recent Labs  Lab 07/19/21 2013  WBC 19.0*  NEUTROABS 16.3*  HGB 13.4  HCT 38.9  MCV 92.6  PLT [id number]   Basic Metabolic Panel: Recent Labs  Lab 07/19/21 2013  NA 137  K 2.5*  CL 101  CO2 24  GLUCOSE 132*  BUN 18  CREATININE 0.91  CALCIUM 9.3   GFR: Estimated Creatinine Clearance: 32.7 mL/min (by C-G formula based on SCr of 0.91 mg/dL). Liver Function Tests: Recent Labs  Lab 07/19/21 2013  AST 53*  ALT 38  ALKPHOS 96  BILITOT 1.0  PROT 6.2*  ALBUMIN 2.7*   Recent Labs  Lab 07/19/21 2013  LIPASE 29   No results for input(s): AMMONIA in the last 168 hours. Coagulation Profile: No results for input(s): INR, PROTIME in the last 168 hours. Cardiac Enzymes: No results for input(s): CKTOTAL, CKMB, CKMBINDEX, TROPONINI in the last 168 hours. BNP (last 3 results) No results for input(s): PROBNP in the last 8760 hours. HbA1C: No results for input(s): HGBA1C in the last 72 hours. CBG: No results for input(s): GLUCAP in the last 168 hours. Lipid Profile: No results for input(s): CHOL, HDL, LDLCALC, TRIG, CHOLHDL, LDLDIRECT in the last 72 hours. Thyroid Function Tests: No results for input(s): TSH, T4TOTAL, FREET4, T3FREE, THYROIDAB in the last 72 hours. Anemia Panel: No results for input(s): VITAMINB12, FOLATE, FERRITIN, TIBC, IRON, RETICCTPCT in the last 72 hours. Urine analysis:    Component Value Date/Time   COLORURINE YELLOW 07/19/2021 2218   APPEARANCEUR HAZY (A) 07/19/2021 2218   LABSPEC 1.018 07/19/2021 2218   PHURINE 6.0 07/19/2021 2218   GLUCOSEU NEGATIVE 07/19/2021  [place] 07/19/2021 2218   [place] 07/19/2021 2218   BILIRUBINUR small 01/17/2015 [place] 07/19/2021 2218   PROTEINUR NEGATIVE 07/19/2021 2218   UROBILINOGEN 1.0 01/17/2015 1139   UROBILINOGEN 0.2 05/02/2014 1652   NITRITE NEGATIVE 07/19/2021 2218   LEUKOCYTESUR LARGE (A) 07/19/2021 2218    Radiological Exams  on Admission: CT ABDOMEN PELVIS W CONTRAST  Result Date: 07/19/2021 CLINICAL DATA:  Abdomen pain UTI EXAM: CT ABDOMEN AND PELVIS WITH CONTRAST TECHNIQUE: Multidetector CT imaging of the abdomen and pelvis was performed using the standard protocol following bolus administration of intravenous contrast. CONTRAST:  62m OMNIPAQUE IOHEXOL 350 MG/ML SOLN COMPARISON:  Chest x-ray 07/19/2021 FINDINGS: Lower chest: Patchy consolidation within the right greater than left lung base. Small right pleural effusion. Normal cardiac size. Hepatobiliary: Subcentimeter hypodensity in the right hepatic lobe too small to further characterize. Status post cholecystectomy. Enlarged extrahepatic common bile duct up to 10 mm. Pancreas: Unremarkable. No pancreatic ductal dilatation or surrounding inflammatory changes. Spleen: Normal in size without focal abnormality. Adrenals/Urinary Tract: Adrenal glands are normal. 4 mm stone lower pole left kidney. Marked right hydronephrosis and proximal hydroureter, secondary to a 10 mm stone within the proximal right ureter about 2.5 cm distal to the right UPJ. Mild wall thickening and enhancement of the proximal right ureter. No significant excretion of contrast from the right kidney on delayed views consistent with obstruction. Bladder unremarkable Stomach/Bowel: The stomach is nonenlarged. No dilated small bowel. No acute bowel wall thickening. Diverticular disease of the left colon without acute wall thickening. Vascular/Lymphatic: Mild aortic atherosclerosis without aneurysm. No suspicious nodes Reproductive: Uterus and bilateral adnexa are unremarkable. Other: Negative for free air or free fluid Musculoskeletal: Orthopedic hardware in the bilateral femurs with artifact. Treated compression deformities L1, L2, L3 and L4. IMPRESSION: 1. Moderate severe right hydronephrosis and proximal hydroureter secondary to a 10 mm stone within the proximal right ureter about 2.5 cm distal to the right UPJ.  2. Nonobstructing stone in the left kidney 3. Small right-sided pleural effusion with patchy consolidation at the right greater than left lung base which may be due to atelectasis, pneumonia, or aspiration 4. Diverticular disease of left colon without acute inflammatory process Electronically Signed   By: KDonavan FoilM.D.   On: 07/19/2021 21:55   DG Chest Portable 1 View  Result Date: 07/19/2021 CLINICAL DATA:  Nausea and vomiting, question aspiration. Mild transient hypoxia. EXAM: PORTABLE CHEST 1 VIEW COMPARISON:  Radiograph 03/27/2021 FINDINGS: Patchy airspace disease at the right lung base. No new streaky left basilar atelectasis. Normal heart size with stable mediastinal contours, aortic tortuosity. No pneumothorax, pleural effusion, or pulmonary edema. Reverse left shoulder arthroplasty. Bones diffusely under mineralized. IMPRESSION: Patchy right basilar airspace disease and left basilar atelectasis, distribution suspicious for aspiration. Electronically Signed   By: MKeith RakeM.D.   On: 07/19/2021 20:38      Assessment/Plan  Urosepsis secondary to obstructing right nephrolithiasis w/ hydronephrosis/hydroureter  -Patient presented with fever, tachycardia and leukocytosis -Urologist Dr. BGloriann Loanto take overnight for urgent stenting  -continue IV Rocephin   Hypokalemia -repleted with IV K   Rt sided lung consolidation -incidentially finding on CXR and CT abd -no respiratory symptoms. Will not start any coverage for pneumonia at this time. Continue to follow clinically  HTN -Continue amlodipine  GERD -Continue PPI  HLD -Continue statin  Chronic pain  -continue home PRN oxycodone  DVT prophylaxis: SCD Code Status: DNR Family Communication: Plan discussed with  patient and daughter at bedside  disposition Plan: Home with at least 2 midnight stays  Consults called:  Admission status: inpatient  Level of care: Telemetry  Status is: Inpatient  Remains inpatient  appropriate because:Inpatient level of care appropriate due to severity of illness  Dispo: The patient is from: SNF              Anticipated d/c is to: SNF              Patient currently is not medically stable to d/c.   Difficult to place patient No         Orene Desanctis DO [facility]   If 7PM-7AM, please contact night-coverage [email or web address]   07/19/2021, 11:24 PM

## 2021-07-19 NOTE — ED Notes (Signed)
Date and time results received: 07/19/21 21:13   Test: lactic acid Critical Value: 2.1  Name of Provider Notified: Dr. Sherry Ruffing

## 2021-07-19 NOTE — ED Triage Notes (Signed)
BIB by EMS from [facility] [place] facility c/o nausea and vomiting that started today. Pt is currently being treated for UTI with cipro. Per pt, she hasn't been eating well this past week and has had chills. Pt also has a PIV inserted from [facility] so she could receive fluids.

## 2021-07-19 NOTE — ED Provider Notes (Signed)
[facility] DEPT Provider Note   CSN: [id number] Arrival date & time: 07/19/21  1932     History Chief Complaint  Patient presents with   Nausea   Emesis    Savannah Coleman is a 87 y.o. female.  The history is provided by the patient and medical records. No language interpreter was used.  Emesis Severity:  Severe Duration:  1 week Timing:  Intermittent Quality:  Stomach contents Progression:  Unchanged Chronicity:  New Recent urination:  Normal Worsened by:  Nothing Associated symptoms: abdominal pain, chills and fever   Associated symptoms: no cough, no diarrhea and no headaches       Past Medical History:  Diagnosis Date   Adenomatous colon polyp 2008   Anemia    Anxiety    Arthritis    knees   Blood transfusion 03/2011   Blood transfusion without reported diagnosis    Closed fracture of left distal radius    Closed fracture of right distal radius 04/17/2020   Closed intertrochanteric fracture of hip, right, initial encounter ([place]) 04/17/2020   Colon polyps    Constipation    COPD (chronic obstructive pulmonary disease) ([place])    Family history of anesthesia complication    " my son aspirated "   Femur fracture, right ([place]) 2015   GERD (gastroesophageal reflux disease)    Heartburn    High cholesterol    History of right radius fracture 04/2020   History of shingles    History of vertebral fracture    Hyperlipidemia    Hypertension    Insomnia    Left Radius fracture 04/2014   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   Recurrent falls 03/27/2021   Resides in long term care facility 03/27/2021   Rosacea     Patient Active Problem List   Diagnosis Date Noted   Postoperative anemia due to acute blood loss 03/29/2021   Multiple fractures 03/27/2021   Hypokalemia 03/27/2021   Recurrent falls 03/27/2021   Closed fracture of neck of left femur ([place]) 03/27/2021    Closed fracture of left distal radius and ulna, initial encounter 03/27/2021   Left humeral neck fracture 03/27/2021   Essential hypertension 03/27/2021   Hyperlipidemia 03/27/2021   Resides in long term care facility 03/27/2021   Osteoporosis 03/27/2021   Closed fracture of anatomical neck of left humerus    Fall at nursing home    Closed fracture of left distal radius    Intertrochanteric fracture of right hip ([place]) 04/17/2020   Closed fracture of right distal radius 04/17/2020   Unspecified constipation 05/05/2014   Postoperative anemia due to acute blood loss 05/02/2014   Femur fracture, right ([place]) 04/30/2014   Hypertension    Hyperlipidemia    Adenomatous colon polyp    History of shingles    Rosacea    Positive PPD    Blood transfusion 03/19/2011    Past Surgical History:  Procedure Laterality Date   [place] IM NAIL Right 05/01/2014   Procedure: INTRAMEDULLARY (IM) RETROGRADE FEMORAL NAILING;  Surgeon: Johnny Bridge, MD;  Location: [facility];  Service: Orthopedics;  Laterality: Right;   FEMUR IM NAIL Right 04/19/2020   Procedure: REMOVAL OF BIOMET RETROGRADE NAIL;  Surgeon: Altamese Halchita, MD;  Location: [facility];  Service: Orthopedics;  Laterality: Right;   FEMUR IM NAIL Right 04/19/2020   Procedure: INSERTION OF ANTEGRADE INTRAMEDULLARY (IM) NAIL FEMORAL;  Surgeon: Altamese Hickory, MD;  Location: [facility];  Service: Orthopedics;  Laterality: Right;   FRACTURE SURGERY     HIP ARTHROPLASTY Right    INTRAMEDULLARY (IM) NAIL INTERTROCHANTERIC Left 03/27/2021   Procedure: INTRAMEDULLARY (IM) NAIL INTERTROCHANTRIC;  Surgeon: Shona Needles, MD;  Location: [facility];  Service: Orthopedics;  Laterality: Left;   JOINT REPLACEMENT     LUMBAR FUSION     ORIF WRIST FRACTURE Right 04/19/2020   Procedure: Open Reduction Internal Fixation (Orif) Wrist Fracture;  Surgeon: Altamese [place] Corner, MD;  Location: [facility];  Service: Orthopedics;  Laterality:  Right;   ORIF WRIST FRACTURE Left 03/27/2021   Procedure: OPEN REDUCTION INTERNAL FIXATION (ORIF) WRIST FRACTURE;  Surgeon: Shona Needles, MD;  Location: [facility];  Service: Orthopedics;  Laterality: Left;   POLYPECTOMY     REPLACEMENT TOTAL KNEE BILATERAL  2011 & 2012   REVERSE SHOULDER ARTHROPLASTY Left 03/30/2021   Procedure: REVERSE SHOULDER ARTHROPLASTY;  Surgeon: Nicholes Stairs, MD;  Location: [facility];  Service: Orthopedics;  Laterality: Left;   TONSILLECTOMY     VERTEBROPLASTY     Lumbar vertebra   WRIST SURGERY Bilateral      OB History   No obstetric history on file.     Family History  Problem Relation Age of Onset   COPD Father    Asthma Father    Asthma Sister    Colon cancer Neg Hx    Stomach cancer Neg Hx     Social History   Tobacco Use   Smoking status: Never   Smokeless tobacco: Never   Tobacco comments:    quit smoking in the early 80's  Vaping Use   Vaping Use: Never used  Substance Use Topics   Alcohol use: Not Currently   Drug use: Not Currently    Home Medications Prior to Admission medications   Medication Sig Start Date End Date Taking? Authorizing Provider  acetaminophen (TYLENOL) 325 MG tablet Take 325 mg by mouth every 4 (four) hours as needed for moderate pain.    [provider]  acetaminophen (TYLENOL) 500 MG tablet Take 1 tablet (500 mg total) by mouth every 12 (twelve) hours. 05/01/20   Alma Friendly, MD  albuterol (PROVENTIL) (2.5 MG/3ML) 0.083% nebulizer solution Take 2.5 mg by nebulization every 4 (four) hours as needed for wheezing or shortness of breath.    [provider]  alum & mag hydroxide-simeth (MAALOX PLUS) 400-400-40 MG/5ML suspension Take 30 mLs by mouth as directed. Give 30 ml once a week on Mondays & Give 30 mls by mouth every 6 hours PRN for Indigestion, Heartburn, Gas, Nausea    [provider]  amLODipine (NORVASC) 5 MG tablet Take 5 mg by mouth daily.     [provider]   amLODipine (NORVASC) 5 MG tablet Take 1 tablet by mouth daily. 03/24/21   [provider]  ascorbic acid (VITAMIN C) 500 MG tablet Take 1 tablet (500 mg total) by mouth daily. 05/01/20   Alma Friendly, MD  BIOTIN PO Take 1 tablet by mouth daily with lunch.    [provider]  bisacodyl (DULCOLAX) 5 MG EC tablet Take 5 mg by mouth daily as needed for moderate constipation.    [provider]  calcitonin, salmon, (MIACALCIN/FORTICAL) 200 UNIT/ACT nasal spray Place 1 spray into alternate nostrils daily.    [provider]  Calcium Carb-Cholecalciferol (CALCIUM+D3 PO) Take 1 tablet by mouth daily with lunch.    [provider]  calcium carbonate (TUMS - DOSED IN MG ELEMENTAL CALCIUM) 500 MG chewable tablet Chew 1 tablet by mouth every 8 (eight) hours as needed for indigestion.    [provider]  calcium-vitamin D (OSCAL WITH D) 500-200 MG-UNIT tablet Take 1 tablet by mouth.    [provider]  Carboxymethylcellulose Sodium (THERATEARS OP) Place 1 drop into both eyes daily.    [provider]  diphenhydramine-acetaminophen (TYLENOL PM) 25-500 MG TABS tablet Take 1 tablet by mouth at bedtime as needed (sleep).    [provider]  docusate sodium (COLACE) 100 MG capsule Take 1 capsule (100 mg total) by mouth 2 (two) times daily. 05/01/20   Alma Friendly, MD  docusate sodium (COLACE) 100 MG capsule Take 100 mg by mouth 2 (two) times daily.    [provider]  enoxaparin (LOVENOX) 40 MG/0.4ML injection Inject 0.4 mLs (40 mg total) into the skin daily for 28 days. 04/23/20 05/21/20  Ainsley Spinner, PA-C  enoxaparin (LOVENOX) 40 MG/0.4ML injection Inject 0.4 mLs (40 mg total) into the skin daily for 28 days. 04/01/21 04/29/21  Delray Alt, PA-C  ferrous sulfate 325 (65 FE) MG EC tablet Take 325 mg by mouth daily with breakfast.    [provider]  ferrous sulfate 325 (65 FE) MG tablet Take 1 tablet (325 mg  total) by mouth daily with breakfast. 05/01/20 06/30/20  Alma Friendly, MD  HYDROcodone-acetaminophen (NORCO/VICODIN) 5-325 MG tablet Take 1 tablet by mouth every 6 (six) hours as needed for severe pain. 07/13/20   Robinson, [place] N, PA-C  Lido-PE-Glycerin-Petrolatum (PREPARATION H RAPID RELIEF) 5-0.25-14.4-15 % CREA Apply 1 application topically every 8 (eight) hours as needed (hemorrhoids).    [provider]  melatonin 3 MG TABS tablet Take 3 mg by mouth at bedtime as needed (sleep).    [provider]  Multiple Vitamin (MULTIVITAMIN ADULT) TABS Take 1 tablet by mouth daily.    [provider]  Multiple Vitamin (MULTIVITAMIN WITH MINERALS) TABS tablet Take 1 tablet by mouth daily with lunch.    [provider]  nitroGLYCERIN (NITROSTAT) 0.4 MG SL tablet Place 0.4 mg under the tongue every 5 (five) minutes as needed for chest pain.    [provider]  ondansetron (ZOFRAN) 4 MG tablet Take 4 mg by mouth daily as needed for nausea or vomiting.    [provider]  oxyCODONE (ROXICODONE) 5 MG immediate release tablet Take 1 tablet (5 mg total) by mouth every 6 (six) hours as needed for severe pain. 04/02/21   Benay Pike, MD  pantoprazole (PROTONIX) 40 MG tablet Take 40 mg by mouth daily. 03/18/20   [provider]  pantoprazole (PROTONIX) 40 MG tablet Take 1 tablet by mouth daily. 03/25/21   [provider]  polyethylene glycol (MIRALAX / GLYCOLAX) 17 g packet Take 17 g by mouth daily. 05/02/20   Alma Friendly, MD  polyethylene glycol (MIRALAX / GLYCOLAX) 17 g packet Take 17 g by mouth daily.    [provider]  POTASSIUM PO Take 1 tablet by mouth daily with lunch.    [provider]  simvastatin (ZOCOR) 20 MG tablet Take 20 mg by mouth at bedtime. 04/17/20   [provider]  simvastatin (ZOCOR) 20 MG tablet Take 20 mg by mouth at bedtime.    [provider]  tiZANidine (  ZANAFLEX) 2 MG  tablet Take 2 mg by mouth every 8 (eight) hours as needed for muscle spasms. 02/19/21   [provider]  vitamin C (ASCORBIC ACID) 500 MG tablet Take 500 mg by mouth daily.    [provider]  VITAMIN D PO Take 1 tablet by mouth daily with lunch.    [provider]    Allergies    Morphine and related  Review of Systems   Review of Systems  Constitutional:  Positive for chills, fatigue and fever.  HENT:  Negative for congestion.   Respiratory:  Negative for cough, chest tightness, shortness of breath and wheezing.   Cardiovascular:  Negative for chest pain and palpitations.  Gastrointestinal:  Positive for abdominal pain, nausea and vomiting. Negative for abdominal distention, constipation and diarrhea.  Genitourinary:  Positive for flank pain. Negative for dysuria.  Musculoskeletal:  Positive for back pain. Negative for neck pain and neck stiffness.  Skin:  Negative for rash and wound.  Neurological:  Negative for weakness, light-headedness and headaches.  Psychiatric/Behavioral:  Negative for agitation and confusion.   All other systems reviewed and are negative.  Physical Exam Updated Vital Signs BP (!) 142/92 (BP Location: Left Arm)   Pulse (!) 113   Temp 99.9 F (37.7 C) (Oral)   Resp 20   Ht '5\' 2"'$  (1.575 m)   Wt 47.6 kg   SpO2 92%   BMI 19.20 kg/m   Physical Exam Vitals and nursing note reviewed.  Constitutional:      General: She is not in acute distress.    Appearance: She is well-developed. She is ill-appearing. She is not toxic-appearing or diaphoretic.  HENT:     Head: Normocephalic and atraumatic.     Nose: Nose normal.     Mouth/Throat:     Mouth: Mucous membranes are moist.  Eyes:     Conjunctiva/sclera: Conjunctivae normal.     Pupils: Pupils are equal, round, and reactive to light.  Cardiovascular:     Rate and Rhythm: Normal rate and regular rhythm.     Heart sounds: No murmur heard. Pulmonary:     Effort: Pulmonary effort  is normal. No respiratory distress.     Breath sounds: Normal breath sounds. No wheezing, rhonchi or rales.  Chest:     Chest wall: No tenderness.  Abdominal:     General: Abdomen is flat.     Palpations: Abdomen is soft.     Tenderness: There is abdominal tenderness. There is no guarding or rebound.  Musculoskeletal:        General: Tenderness present.     Cervical back: Neck supple. No tenderness.  Skin:    General: Skin is warm and dry.     Capillary Refill: Capillary refill takes less than 2 seconds.     Findings: No erythema.  Neurological:     General: No focal deficit present.     Mental Status: She is alert.  Psychiatric:        Mood and Affect: Mood normal.    ED Results / Procedures / Treatments   Labs (all labs ordered are listed, but only abnormal results are displayed) Labs Reviewed  CBC WITH DIFFERENTIAL/PLATELET - Abnormal; Notable for the following components:      Result Value   WBC 19.0 (*)    Neutro Abs 16.3 (*)    Lymphs Abs 0.6 (*)    Monocytes Absolute 1.8 (*)    Abs Immature Granulocytes 0.25 (*)  All other components within normal limits  COMPREHENSIVE METABOLIC PANEL - Abnormal; Notable for the following components:   Potassium 2.5 (*)    Glucose, Bld 132 (*)    Total Protein 6.2 (*)    Albumin 2.7 (*)    AST 53 (*)    All other components within normal limits  LACTIC ACID, PLASMA - Abnormal; Notable for the following components:   Lactic Acid, Venous 2.1 (*)    All other components within normal limits  URINALYSIS, ROUTINE W REFLEX MICROSCOPIC - Abnormal; Notable for the following components:   APPearance HAZY (*)    Leukocytes,Ua LARGE (*)    WBC, UA >50 (*)    Bacteria, UA RARE (*)    All other components within normal limits  RESP PANEL BY RT-PCR (FLU A&B, COVID) ARPGX2  CULTURE, BLOOD (ROUTINE X 2)  CULTURE, BLOOD (ROUTINE X 2)  URINE CULTURE  LACTIC ACID, PLASMA  LIPASE, BLOOD  TSH  CBC  BASIC METABOLIC PANEL     EKG None  Radiology CT ABDOMEN PELVIS W CONTRAST  Result Date: 07/19/2021 CLINICAL DATA:  Abdomen pain UTI EXAM: CT ABDOMEN AND PELVIS WITH CONTRAST TECHNIQUE: Multidetector CT imaging of the abdomen and pelvis was performed using the standard protocol following bolus administration of intravenous contrast. CONTRAST:  22m OMNIPAQUE IOHEXOL 350 MG/ML SOLN COMPARISON:  Chest x-ray 07/19/2021 FINDINGS: Lower chest: Patchy consolidation within the right greater than left lung base. Small right pleural effusion. Normal cardiac size. Hepatobiliary: Subcentimeter hypodensity in the right hepatic lobe too small to further characterize. Status post cholecystectomy. Enlarged extrahepatic common bile duct up to 10 mm. Pancreas: Unremarkable. No pancreatic ductal dilatation or surrounding inflammatory changes. Spleen: Normal in size without focal abnormality. Adrenals/Urinary Tract: Adrenal glands are normal. 4 mm stone lower pole left kidney. Marked right hydronephrosis and proximal hydroureter, secondary to a 10 mm stone within the proximal right ureter about 2.5 cm distal to the right UPJ. Mild wall thickening and enhancement of the proximal right ureter. No significant excretion of contrast from the right kidney on delayed views consistent with obstruction. Bladder unremarkable Stomach/Bowel: The stomach is nonenlarged. No dilated small bowel. No acute bowel wall thickening. Diverticular disease of the left colon without acute wall thickening. Vascular/Lymphatic: Mild aortic atherosclerosis without aneurysm. No suspicious nodes Reproductive: Uterus and bilateral adnexa are unremarkable. Other: Negative for free air or free fluid Musculoskeletal: Orthopedic hardware in the bilateral femurs with artifact. Treated compression deformities L1, L2, L3 and L4. IMPRESSION: 1. Moderate severe right hydronephrosis and proximal hydroureter secondary to a 10 mm stone within the proximal right ureter about 2.5 cm distal to  the right UPJ. 2. Nonobstructing stone in the left kidney 3. Small right-sided pleural effusion with patchy consolidation at the right greater than left lung base which may be due to atelectasis, pneumonia, or aspiration 4. Diverticular disease of left colon without acute inflammatory process Electronically Signed   By: KDonavan FoilM.D.   On: 07/19/2021 21:55   DG Chest Portable 1 View  Result Date: 07/19/2021 CLINICAL DATA:  Nausea and vomiting, question aspiration. Mild transient hypoxia. EXAM: PORTABLE CHEST 1 VIEW COMPARISON:  Radiograph 03/27/2021 FINDINGS: Patchy airspace disease at the right lung base. No new streaky left basilar atelectasis. Normal heart size with stable mediastinal contours, aortic tortuosity. No pneumothorax, pleural effusion, or pulmonary edema. Reverse left shoulder arthroplasty. Bones diffusely under mineralized. IMPRESSION: Patchy right basilar airspace disease and left basilar atelectasis, distribution suspicious for aspiration. Electronically Signed   By:  Keith Rake M.D.   On: 07/19/2021 20:38    Procedures Procedures   CRITICAL CARE Performed by: Gwenyth Allegra Sonakshi Rolland Total critical care time: 45 minutes Critical care time was exclusive of separately billable procedures and treating other patients. Critical care was necessary to treat or prevent imminent or life-threatening deterioration. Critical care was time spent personally by me on the following activities: development of treatment plan with patient and/or surrogate as well as nursing, discussions with consultants, evaluation of patient's response to treatment, examination of patient, obtaining history from patient or surrogate, ordering and performing treatments and interventions, ordering and review of laboratory studies, ordering and review of radiographic studies, pulse oximetry and re-evaluation of patient's condition.   Medications Ordered in ED Medications  potassium chloride 10 mEq in 100 mL  IVPB (10 mEq Intravenous New Bag/Given 07/19/21 2319)  acetaminophen (OFIRMEV) 10 MG/ML IV (has no administration in time range)  cefTRIAXone (ROCEPHIN) 1 g in sodium chloride 0.9 % 100 mL IVPB (has no administration in time range)  cefTRIAXone (ROCEPHIN) 1 g in sodium chloride 0.9 % 100 mL IVPB (has no administration in time range)  ondansetron (ZOFRAN) injection 4 mg (4 mg Intravenous Given 07/19/21 2019)  sodium chloride 0.9 % bolus 1,000 mL (0 mLs Intravenous Stopped 07/19/21 2201)  iohexol (OMNIPAQUE) 350 MG/ML injection 80 mL (80 mLs Intravenous Contrast Given 07/19/21 2131)    ED Course  I have reviewed the triage vital signs and the nursing notes.  Pertinent labs & imaging results that were available during my care of the patient were reviewed by me and considered in my medical decision making (see chart for details).    MDM Rules/Calculators/A&P                           NATALEY DESTEFANIS is a 87 y.o. female with a past medical history significant for hypertension, hyperlipidemia, multiple orthopedic injuries, COPD, hypercholesterolemia, and currently on antibiotics for urinary tract infection who presents from her nursing facility for nausea, vomiting, right abdominal/flank pain, and fatigue.  According to family, patient has not been eating or drinking well the last few days and was given an  IV at her facility to try to give her some fluids.  She is still feeling very dehydrated.  She is denying any cough, chest pain, shortness of breath but oxygen saturations were around 90% on arrival.  She is denying constipation or diarrhea and had normal bowel movement today.  She still passing gas.  She denies any new trauma but has previous orthopedic injuries that were managed several months ago.  On arrival, patient was warm to the touch and a temperature returned showing that she is febrile at 100.5.  She was tachycardic.  She was not tachypneic or hypotensive initially.  On exam, right flank and  abdomen is tender to palpation but bowel sounds were appreciated.  Lungs had some coarseness but chest was nontender.  Patient is alert and oriented but feeling very fatigued and nauseous still.  Clinically I am concerned that patient is heading towards more systemic infection related to her UTI.  Review of the patient's documentation from facility shows that she was given a dose of Rocephin and then started on Augmentin.  This does not seem to be helping.  There was also question with EMS about her getting Cipro although that is unclear.  I am concerned that patient could have pyelonephritis, stone, or other infection.  Due to  the oxygen saturation being around 90%, will get chest x-ray as well.  Work-up began to return and she did have concern for possible pneumonia on chest x-ray.  CT scan also returned showing that she has a right sided 1 cm proximal kidney stone with severe hydronephrosis on the right side.  Suspect infected stone.  Will call urology for further management.  Will discuss broadening antibiotics to include the possible pneumonia as well.  Patient was found to have hypokalemia of 2.5 likely due to her nausea and vomiting and IV potassium was ordered.  Patient will need admission for further management.   Spoke to Dr. Gloriann Loan with urology who will take her to the [facility].  We will call medicine for admission for the hypokalemia, nausea vomiting, and overall dehydration with pain.  Medicine will meet inpatient go to the OR with urology.  Final Clinical Impression(s) / ED Diagnoses Final diagnoses:  Kidney stone  Acute cystitis without hematuria    Clinical Impression: 1. Kidney stone   2. Acute cystitis without hematuria     Disposition: Admit  This note was prepared with assistance of Dragon voice recognition software. Occasional wrong-word or sound-a-like substitutions may have occurred due to the inherent limitations of voice recognition software.     Karlos Scadden,  Gwenyth Allegra, MD 07/19/21 [phone]

## 2021-07-19 NOTE — Progress Notes (Signed)
Pt tx from ER, 3rd bag of potassium infusing now on Lt arm.

## 2021-07-19 NOTE — Anesthesia Preprocedure Evaluation (Addendum)
Anesthesia Evaluation  Patient identified by MRN, date of birth, ID band Patient awake    Reviewed: Allergy & Precautions, H&P , NPO status , Patient's Chart, lab work & pertinent test results  Airway Mallampati: III  TM Distance: >3 FB Neck ROM: Full    Dental no notable dental hx. (+) Teeth Intact, Dental Advisory Given   Pulmonary COPD,    Pulmonary exam normal breath sounds clear to auscultation       Cardiovascular hypertension, Pt. on medications  Rhythm:Regular Rate:Normal     Neuro/Psych Anxiety negative neurological ROS     GI/Hepatic Neg liver ROS, GERD  Medicated,  Endo/Other  negative endocrine ROS  Renal/GU negative Renal ROS  negative genitourinary   Musculoskeletal  (+) Arthritis , Osteoarthritis,    Abdominal   Peds  Hematology  (+) Blood dyscrasia, anemia ,   Anesthesia Other Findings   Reproductive/Obstetrics negative OB ROS                            Anesthesia Physical Anesthesia Plan  ASA: 2 and emergent  Anesthesia Plan: General   Post-op Pain Management:    Induction: Intravenous  PONV Risk Score and Plan: 4 or greater and Ondansetron, Dexamethasone and Treatment may vary due to age or medical condition  Airway Management Planned: LMA and Oral ETT  Additional Equipment:   Intra-op Plan:   Post-operative Plan: Extubation in OR  Informed Consent: I have reviewed the patients History and Physical, chart, labs and discussed the procedure including the risks, benefits and alternatives for the proposed anesthesia with the patient or authorized representative who has indicated his/her understanding and acceptance.   Patient has DNR.  Discussed DNR with patient and Suspend DNR.   Dental advisory given  Plan Discussed with: CRNA  Anesthesia Plan Comments:        Anesthesia Quick Evaluation

## 2021-07-20 ENCOUNTER — Encounter (HOSPITAL_COMMUNITY): Payer: Self-pay | Admitting: Family Medicine

## 2021-07-20 ENCOUNTER — Inpatient Hospital Stay (HOSPITAL_COMMUNITY): Payer: Medicare Other

## 2021-07-20 ENCOUNTER — Inpatient Hospital Stay (HOSPITAL_COMMUNITY): Payer: Medicare Other | Admitting: Certified Registered"

## 2021-07-20 DIAGNOSIS — N3000 Acute cystitis without hematuria: Secondary | ICD-10-CM

## 2021-07-20 DIAGNOSIS — K219 Gastro-esophageal reflux disease without esophagitis: Secondary | ICD-10-CM

## 2021-07-20 DIAGNOSIS — N200 Calculus of kidney: Secondary | ICD-10-CM

## 2021-07-20 LAB — BASIC METABOLIC PANEL
Anion gap: 7 (ref 5–15)
BUN: 16 mg/dL (ref 8–23)
CO2: 24 mmol/L (ref 22–32)
Calcium: 8.3 mg/dL — ABNORMAL LOW (ref 8.9–10.3)
Chloride: 105 mmol/L (ref 98–111)
Creatinine, Ser: 0.79 mg/dL (ref 0.44–1.00)
GFR, Estimated: >60 mL/min (ref >60)
Glucose, Bld: 127 mg/dL — ABNORMAL HIGH (ref 70–99)
Potassium: 3.4 mmol/L — ABNORMAL LOW (ref 3.5–5.1)
Sodium: 136 mmol/L (ref 135–145)

## 2021-07-20 LAB — CBC
HCT: 35.5 % — ABNORMAL LOW (ref 36.0–46.0)
Hemoglobin: 11.7 g/dL — ABNORMAL LOW (ref 12.0–15.0)
MCH: 31.4 pg (ref 26.0–34.0)
MCHC: 33 g/dL (ref 30.0–36.0)
MCV: 95.2 fL (ref 80.0–100.0)
Platelets: 155 10*3/uL (ref 150–400)
RBC: 3.73 MIL/uL — ABNORMAL LOW (ref 3.87–5.11)
RDW: 14.5 % (ref 11.5–15.5)
WBC: 19.7 10*3/uL — ABNORMAL HIGH (ref 4.0–10.5)
nRBC: 0 % (ref 0.0–0.2)

## 2021-07-20 LAB — TSH: TSH: 2.691 u[IU]/mL (ref 0.350–4.500)

## 2021-07-20 LAB — MAGNESIUM: Magnesium: 1.6 mg/dL — ABNORMAL LOW (ref 1.7–2.4)

## 2021-07-20 MED ORDER — CALCITONIN (SALMON) 200 UNIT/ACT NA SOLN: 1.0000 | Freq: Every day | NASAL | Status: DC

## 2021-07-20 MED ORDER — MAGNESIUM SULFATE 4 GM/100ML IV SOLN
4.0000 g | Freq: Once | INTRAVENOUS | Status: AC
  Administered 2021-07-20: 4 g via INTRAVENOUS
  Filled 2021-07-20: qty 100

## 2021-07-20 MED ORDER — ONDANSETRON HCL 4 MG/2ML IJ SOLN
INTRAMUSCULAR | Status: DC | PRN
  Administered 2021-07-20: 4 mg via INTRAVENOUS

## 2021-07-20 MED ORDER — CALCIUM CARBONATE-VITAMIN D 500-200 MG-UNIT PO TABS
1.0000 | ORAL_TABLET | Freq: Two times a day (BID) | ORAL | Status: DC
  Administered 2021-07-20 – 2021-07-22 (×5): 1 via ORAL
  Filled 2021-07-20 (×5): qty 1

## 2021-07-20 MED ORDER — SENNOSIDES-DOCUSATE SODIUM 8.6-50 MG PO TABS
1.0000 | ORAL_TABLET | Freq: Every day | ORAL | Status: DC
  Administered 2021-07-20 – 2021-07-21 (×2): 1 via ORAL
  Filled 2021-07-20 (×2): qty 1

## 2021-07-20 MED ORDER — LACTATED RINGERS IV SOLN
INTRAVENOUS | Status: DC | PRN
Start: 2021-07-20 — End: 2021-07-20

## 2021-07-20 MED ORDER — POLYETHYLENE GLYCOL 3350 17 G PO PACK
17.0000 g | PACK | Freq: Every day | ORAL | Status: DC
  Administered 2021-07-20: 17 g via ORAL
  Filled 2021-07-20 (×3): qty 1

## 2021-07-20 MED ORDER — SODIUM CHLORIDE 0.9 % IR SOLN
Status: DC | PRN
  Administered 2021-07-20: 1000 mL

## 2021-07-20 MED ORDER — FERROUS SULFATE 325 (65 FE) MG PO TABS
325.0000 mg | ORAL_TABLET | Freq: Every day | ORAL | Status: DC
  Administered 2021-07-20 – 2021-07-22 (×3): 325 mg via ORAL
  Filled 2021-07-20 (×3): qty 1

## 2021-07-20 MED ORDER — FENTANYL CITRATE (PF) 100 MCG/2ML IJ SOLN
INTRAMUSCULAR | Status: AC
  Filled 2021-07-20: qty 2

## 2021-07-20 MED ORDER — ACETAMINOPHEN 10 MG/ML IV SOLN
INTRAVENOUS | Status: DC | PRN
  Administered 2021-07-20: 1000 mg via INTRAVENOUS

## 2021-07-20 MED ORDER — LIP MEDEX EX OINT
TOPICAL_OINTMENT | CUTANEOUS | Status: DC | PRN
  Filled 2021-07-20: qty 7

## 2021-07-20 MED ORDER — PROPOFOL 10 MG/ML IV BOLUS
INTRAVENOUS | Status: DC | PRN
  Administered 2021-07-20: 120 mg via INTRAVENOUS

## 2021-07-20 MED ORDER — BISACODYL 5 MG PO TBEC
5.0000 mg | DELAYED_RELEASE_TABLET | Freq: Every day | ORAL | Status: DC | PRN
  Filled 2021-07-20: qty 1

## 2021-07-20 MED ORDER — FENTANYL CITRATE (PF) 250 MCG/5ML IJ SOLN
INTRAMUSCULAR | Status: DC | PRN
  Administered 2021-07-20 (×2): 25 ug via INTRAVENOUS
  Administered 2021-07-20: 50 ug via INTRAVENOUS

## 2021-07-20 MED ORDER — SODIUM CHLORIDE 0.9 % IV SOLN
2.0000 g | INTRAVENOUS | Status: DC
  Administered 2021-07-20 – 2021-07-22 (×3): 2 g via INTRAVENOUS
  Filled 2021-07-20 (×3): qty 2

## 2021-07-20 MED ORDER — PANTOPRAZOLE SODIUM 40 MG PO TBEC
40.0000 mg | DELAYED_RELEASE_TABLET | Freq: Every day | ORAL | Status: DC
  Administered 2021-07-20 – 2021-07-22 (×3): 40 mg via ORAL
  Filled 2021-07-20 (×3): qty 1

## 2021-07-20 MED ORDER — ASCORBIC ACID 500 MG PO TABS
500.0000 mg | ORAL_TABLET | Freq: Every day | ORAL | Status: DC
  Administered 2021-07-20 – 2021-07-22 (×3): 500 mg via ORAL
  Filled 2021-07-20 (×3): qty 1

## 2021-07-20 MED ORDER — SODIUM CHLORIDE 0.9 % IV SOLN: 1.0000 g | INTRAVENOUS | Status: DC

## 2021-07-20 MED ORDER — FENTANYL CITRATE (PF) 100 MCG/2ML IJ SOLN
25.0000 ug | INTRAMUSCULAR | Status: DC | PRN
  Administered 2021-07-20 (×2): 50 ug via INTRAVENOUS

## 2021-07-20 MED ORDER — AMLODIPINE BESYLATE 5 MG PO TABS
5.0000 mg | ORAL_TABLET | Freq: Every day | ORAL | Status: DC
  Administered 2021-07-21 – 2021-07-22 (×2): 5 mg via ORAL
  Filled 2021-07-20 (×2): qty 1

## 2021-07-20 MED ORDER — PHENYLEPHRINE HCL-NACL 20-0.9 MG/250ML-% IV SOLN
INTRAVENOUS | Status: DC | PRN
  Administered 2021-07-20: 30 ug/min via INTRAVENOUS

## 2021-07-20 MED ORDER — TIZANIDINE HCL 4 MG PO TABS
2.0000 mg | ORAL_TABLET | Freq: Three times a day (TID) | ORAL | Status: DC | PRN
  Filled 2021-07-20: qty 1

## 2021-07-20 MED ORDER — POTASSIUM CHLORIDE CRYS ER 20 MEQ PO TBCR
40.0000 meq | EXTENDED_RELEASE_TABLET | Freq: Once | ORAL | Status: AC
  Administered 2021-07-20: 40 meq via ORAL
  Filled 2021-07-20: qty 2

## 2021-07-20 MED ORDER — AMLODIPINE BESYLATE 5 MG PO TABS: 5.0000 mg | ORAL_TABLET | Freq: Every day | ORAL | Status: DC

## 2021-07-20 MED ORDER — CALCITONIN (SALMON) 200 UNIT/ACT NA SOLN
1.0000 | Freq: Every day | NASAL | Status: DC
  Administered 2021-07-20 – 2021-07-22 (×3): 1 via NASAL
  Filled 2021-07-20: qty 3.7

## 2021-07-20 MED ORDER — DEXAMETHASONE SODIUM PHOSPHATE 10 MG/ML IJ SOLN
INTRAMUSCULAR | Status: DC | PRN
Start: 2021-07-20 — End: 2021-07-20
  Administered 2021-07-20: 4 mg via INTRAVENOUS

## 2021-07-20 MED ORDER — IOHEXOL 300 MG/ML  SOLN
INTRAMUSCULAR | Status: DC | PRN
  Administered 2021-07-20: 10 mL via URETHRAL

## 2021-07-20 MED ORDER — SODIUM CHLORIDE 0.9 % IV SOLN: INTRAVENOUS | Status: AC

## 2021-07-20 MED ORDER — BOOST BREEZE PO LIQD: 1.0000 | Freq: Two times a day (BID) | ORAL | Status: DC

## 2021-07-20 MED ORDER — LIDOCAINE 2% (20 MG/ML) 5 ML SYRINGE
INTRAMUSCULAR | Status: DC | PRN
  Administered 2021-07-20: 60 mg via INTRAVENOUS

## 2021-07-20 MED ORDER — OXYCODONE HCL 5 MG PO TABS: 5.0000 mg | ORAL_TABLET | Freq: Four times a day (QID) | ORAL | Status: DC | PRN

## 2021-07-20 MED ORDER — MELATONIN 3 MG PO TABS: 3.0000 mg | ORAL_TABLET | Freq: Every evening | ORAL | Status: DC | PRN

## 2021-07-20 MED ORDER — ACETAMINOPHEN 325 MG PO TABS
325.0000 mg | ORAL_TABLET | ORAL | Status: DC | PRN
  Administered 2021-07-21 – 2021-07-22 (×2): 325 mg via ORAL
  Filled 2021-07-20 (×2): qty 1

## 2021-07-20 MED ORDER — SIMVASTATIN 20 MG PO TABS
20.0000 mg | ORAL_TABLET | Freq: Every day | ORAL | Status: DC
  Administered 2021-07-20 – 2021-07-21 (×2): 20 mg via ORAL
  Filled 2021-07-20 (×2): qty 1

## 2021-07-20 MED ORDER — STERILE WATER FOR IRRIGATION IR SOLN
Status: DC | PRN
  Administered 2021-07-20: 3000 mL

## 2021-07-20 MED ORDER — BOOST / RESOURCE BREEZE PO LIQD CUSTOM
1.0000 | Freq: Two times a day (BID) | ORAL | Status: DC
  Administered 2021-07-20 – 2021-07-21 (×3): 1 via ORAL
  Filled 2021-07-20 (×4): qty 1

## 2021-07-20 NOTE — Anesthesia Postprocedure Evaluation (Signed)
Anesthesia Post Note  Patient: Savannah Coleman  Procedure(s) Performed: CYSTOSCOPY WITH URETEROSCOPY AND STENT PLACEMENT (Right: Ureter)     Patient location during evaluation: PACU Anesthesia Type: General Level of consciousness: awake and alert Pain management: pain level controlled Vital Signs Assessment: post-procedure vital signs reviewed and stable Respiratory status: spontaneous breathing, nonlabored ventilation, respiratory function stable and patient connected to face mask oxygen Cardiovascular status: blood pressure returned to baseline and stable Postop Assessment: no apparent nausea or vomiting Anesthetic complications: no   No notable events documented.  Last Vitals:  Vitals:   07/20/21 0043 07/20/21 0045  BP: (!) 154/92 (!) 157/89  Pulse: 88 93  Resp: 19 20  Temp: 36.8 C   SpO2: 100% 100%    Last Pain:  Vitals:   07/20/21 0043  TempSrc:   PainSc: 0-No pain                 Yusuf Yu,W. EDMOND

## 2021-07-20 NOTE — Transfer of Care (Signed)
Immediate Anesthesia Transfer of Care Note  Patient: Savannah Coleman  Procedure(s) Performed: CYSTOSCOPY WITH URETEROSCOPY AND STENT PLACEMENT (Right: Ureter)  Patient Location: PACU  Anesthesia Type:General  Level of Consciousness: awake, alert , oriented and patient cooperative  Airway & Oxygen Therapy: Patient Spontanous Breathing and Patient connected to face mask oxygen  Post-op Assessment: Report given to RN and Post -op Vital signs reviewed and stable  Post vital signs: Reviewed and stable  Last Vitals:  Vitals Value Taken Time  BP 154/92 07/20/21 0044  Temp    Pulse 93 07/20/21 0045  Resp 18 07/20/21 0045  SpO2 100 % 07/20/21 0045  Vitals shown include unvalidated device data.  Last Pain:  Vitals:   07/19/21 2055  TempSrc: Rectal  PainSc:          Complications: No notable events documented.

## 2021-07-20 NOTE — Progress Notes (Signed)
PROGRESS NOTE    Savannah Coleman  WKT:[id number] DOB: 07-May-1931 DOA: 07/19/2021 PCP: Pcp, No    Chief Complaint  Patient presents with   Nausea   Emesis    Brief Narrative:  Is a pleasant 87 year old female history of hypertension, history of multiple fractures, COPD, GERD, hyperlipidemia who presented to the ED with nausea, vomiting, increased right-sided abdominal pain.  Patient noted to be febrile in the ED with a temp of 100.5, tachycardic, normotensive.  Chest x-ray showed right-sided opacity concerning for atelectasis versus infection.  CT abdomen and pelvis with right hydronephrosis/hydroureter with 10 mm stone, urinalysis concerning for UTI, patient with a leukocytosis with a white count of 19,000.  Patient admitted for sepsis secondary to UTI.  Urology consulted and patient taken emergently to the OR for stent placement.  Patient placed empirically on IV Rocephin.   Assessment & Plan:   Principal Problem:   Sepsis (HCC) Active Problems:   Hypertension   Hyperlipidemia   Hypokalemia   Nephrolithiasis   GERD (gastroesophageal reflux disease)   #1 sepsis secondary to UTI and obstructing right nephrolithiasis with hydronephrosis/hydroureter -Patient on presentation met criteria for sepsis with fever, tachycardia, leukocytosis, source of infection is obstructing right nephrolithiasis with hydronephrosis/hydroureter and UTI. -Blood cultures, urine cultures pending. -Patient status post cystoscopy with right retrograde pyelogram and right ureteral stent placement with large amount of purulent E flux noted during procedure and cultures sent off. -Increase Rocephin dose to 2 g daily. -IV fluids, pain management, supportive care. -Urology following and appreciate input and recommendations.  2.  Hypokalemia/hypomagnesemia -Magnesium sulfate 4 g IV x1. -K-Dur 40 mEq p.o. x1. -Repeat labs in the morning.  3.  GERD -PPI.  4.  Right-sided lung consolidation -Incidentally  noted on chest x-ray and CT abdomen. -Patient currently with no respiratory symptoms.  Doubt if patient has a pneumonia. -Incentive spirometry. -Patient already on IV Rocephin secondary to problem #1.  Follow.  5.  Hypertension -Resume Norvasc tomorrow.  6.  Hyperlipidemia -Statin.  7.  Chronic pain -Continue home regimen of pain medication.   DVT prophylaxis: SCDs Code Status: DNR Family Communication: Updated patient.  No family at bedside. Disposition:   Status is: Inpatient  Remains inpatient appropriate because:IV treatments appropriate due to intensity of illness or inability to take PO  Dispo: The patient is from: SNF              Anticipated d/c is to: SNF              Patient currently is not medically stable to d/c.   Difficult to place patient No       Consultants:  Urology: Dr. Alvester Morin 07/19/2021  Procedures:  CT abdomen and pelvis 07/19/2021 Chest x-ray 07/19/2021 Cystoscopy with right retrograde pyelogram and right ureteral stent placement per Dr. Alvester Morin, urology 07/19/2021    Antimicrobials: IV Rocephin 07/20/2021   Subjective: In PACU. On telephone. Feeling a whole lot better. No CP, no SOB. No nausea or emesis. Abdominal pain/right groin pain improved.   Objective: Vitals:   07/20/21 0400 07/20/21 0500 07/20/21 0600 07/20/21 0700  BP: 125/82 135/81 (!) 142/86 125/76  Pulse: 71 79 77 73  Resp: 13 14 13 13   Temp: 98 F (36.7 C)   98.3 F (36.8 C)  TempSrc:      SpO2: 99% 97% 98% 98%  Weight:      Height:        Intake/Output Summary (Last 24 hours) at 07/20/2021 1027 Last data  filed at 07/20/2021 0658 Gross per 24 hour  Intake 2375 ml  Output 127 ml  Net 2248 ml   Filed Weights   07/19/21 1945  Weight: 47.6 kg    Examination:  General exam: Appears calm and comfortable  Respiratory system: Clear to auscultation anterior lung fields. Respiratory effort normal. Cardiovascular system: S1 & S2 heard, RRR. No JVD, murmurs, rubs, gallops or  clicks. No pedal edema. Gastrointestinal system: Abdomen is nondistended, soft and nontender. No organomegaly or masses felt. Normal bowel sounds heard. Central nervous system: Alert and oriented. No focal neurological deficits. Extremities: Symmetric 5 x 5 power. Skin: No rashes, lesions or ulcers Psychiatry: Judgement and insight appear normal. Mood & affect appropriate.     Data Reviewed: I have personally reviewed following labs and imaging studies  CBC: Recent Labs  Lab 07/19/21 2013 07/20/21 0539  WBC 19.0* 19.7*  NEUTROABS 16.3*  --   HGB 13.4 11.7*  HCT 38.9 35.5*  MCV 92.6 95.2  PLT 166 491    Basic Metabolic Panel: Recent Labs  Lab 07/19/21 2013 07/20/21 0539  NA 137 136  K 2.5* 3.4*  CL 101 105  CO2 24 24  GLUCOSE 132* 127*  BUN 18 16  CREATININE 0.91 0.79  CALCIUM 9.3 8.3*  MG  --  1.6*    GFR: Estimated Creatinine Clearance: 37.2 mL/min (by C-G formula based on SCr of 0.79 mg/dL).  Liver Function Tests: Recent Labs  Lab 07/19/21 2013  AST 53*  ALT 38  ALKPHOS 96  BILITOT 1.0  PROT 6.2*  ALBUMIN 2.7*    CBG: No results for input(s): GLUCAP in the last 168 hours.   Recent Results (from the past 240 hour(s))  Blood culture (routine x 2)     Status: None (Preliminary result)   Collection Time: 07/19/21  8:14 PM   Specimen: BLOOD  Result Value Ref Range Status   Specimen Description   Final    BLOOD BLOOD LEFT FOREARM Performed at [facility] [address] [place], [place] [address]    Special Requests   Final    BOTTLES DRAWN AEROBIC AND ANAEROBIC Blood Culture adequate volume Performed at [facility] [address] [place], [place] [address]    Culture   Final    NO GROWTH < 12 HOURS Performed at [facility] [address], [place], [place] [address]    Report Status PENDING  Incomplete  Resp Panel by RT-PCR (Flu A&B, Covid) Nasopharyngeal Swab     Status: None   Collection  Time: 07/19/21  8:14 PM   Specimen: Nasopharyngeal Swab; Nasopharyngeal(NP) swabs in vial transport medium  Result Value Ref Range Status   SARS Coronavirus 2 by RT PCR NEGATIVE NEGATIVE Final    Comment: (NOTE) SARS-CoV-2 target nucleic acids are NOT DETECTED.  The SARS-CoV-2 RNA is generally detectable in upper respiratory specimens during the acute phase of infection. The lowest concentration of SARS-CoV-2 viral copies this assay can detect is 138 copies/mL. A negative result does not preclude SARS-Cov-2 infection and should not be used as the sole basis for treatment or other patient management decisions. A negative result may occur with  improper specimen collection/handling, submission of specimen other than nasopharyngeal swab, presence of viral mutation(s) within the areas targeted by this assay, and inadequate number of viral copies(<138 copies/mL). A negative result must be combined with clinical observations, patient history, and epidemiological information. The expected result is Negative.  Fact Sheet for  Patients:  EntrepreneurPulse.com.au  Fact Sheet for Healthcare Providers:  IncredibleEmployment.be  This test is no t yet approved or cleared by the [place] FDA and  has been authorized for detection and/or diagnosis of SARS-CoV-2 by FDA under an Emergency Use Authorization (EUA). This EUA will remain  in effect (meaning this test can be used) for the duration of the COVID-19 declaration under Section 564(b)(1) of the Act, 21 U.S.C.section 360bbb-3(b)(1), unless the authorization is terminated  or revoked sooner.       Influenza A by PCR NEGATIVE NEGATIVE Final   Influenza B by PCR NEGATIVE NEGATIVE Final    Comment: (NOTE) The Xpert Xpress SARS-CoV-2/FLU/RSV plus assay is intended as an aid in the diagnosis of influenza from Nasopharyngeal swab specimens and should not be used as a sole basis for treatment. Nasal washings  and aspirates are unacceptable for Xpert Xpress SARS-CoV-2/FLU/RSV testing.  Fact Sheet for Patients: EntrepreneurPulse.com.au  Fact Sheet for Healthcare Providers: IncredibleEmployment.be  This test is not yet approved or cleared by the [place] FDA and has been authorized for detection and/or diagnosis of SARS-CoV-2 by FDA under an Emergency Use Authorization (EUA). This EUA will remain in effect (meaning this test can be used) for the duration of the COVID-19 declaration under Section 564(b)(1) of the Act, 21 U.S.C. section 360bbb-3(b)(1), unless the authorization is terminated or revoked.  Performed at [facility], [place] [address] [place], [place] [address]   Blood culture (routine x 2)     Status: None (Preliminary result)   Collection Time: 07/19/21  8:19 PM   Specimen: BLOOD  Result Value Ref Range Status   Specimen Description   Final    BLOOD BLOOD RIGHT FOREARM Performed at [facility] [address] [place], [place] [address]    Special Requests   Final    BOTTLES DRAWN AEROBIC AND ANAEROBIC Blood Culture results may not be optimal due to an inadequate volume of blood received in culture bottles Performed at [facility] [address], [place], [place] [address]    Culture   Final    NO GROWTH < 12 HOURS Performed at [facility] [address], [place], [place] [address]    Report Status PENDING  Incomplete         Radiology Studies: CT ABDOMEN PELVIS W CONTRAST  Result Date: 07/19/2021 CLINICAL DATA:  Abdomen pain UTI EXAM: CT ABDOMEN AND PELVIS WITH CONTRAST TECHNIQUE: Multidetector CT imaging of the abdomen and pelvis was performed using the standard protocol following bolus administration of intravenous contrast. CONTRAST:  43mL OMNIPAQUE IOHEXOL 350 MG/ML SOLN COMPARISON:  Chest x-ray 07/19/2021 FINDINGS: Lower chest: Patchy consolidation within  the right greater than left lung base. Small right pleural effusion. Normal cardiac size. Hepatobiliary: Subcentimeter hypodensity in the right hepatic lobe too small to further characterize. Status post cholecystectomy. Enlarged extrahepatic common bile duct up to 10 mm. Pancreas: Unremarkable. No pancreatic ductal dilatation or surrounding inflammatory changes. Spleen: Normal in size without focal abnormality. Adrenals/Urinary Tract: Adrenal glands are normal. 4 mm stone lower pole left kidney. Marked right hydronephrosis and proximal hydroureter, secondary to a 10 mm stone within the proximal right ureter about 2.5 cm distal to the right UPJ. Mild wall thickening and enhancement of the proximal right ureter. No significant excretion of contrast from the right kidney on delayed views consistent with obstruction. Bladder unremarkable Stomach/Bowel: The stomach is nonenlarged. No dilated small bowel. No acute bowel wall thickening. Diverticular  disease of the left colon without acute wall thickening. Vascular/Lymphatic: Mild aortic atherosclerosis without aneurysm. No suspicious nodes Reproductive: Uterus and bilateral adnexa are unremarkable. Other: Negative for free air or free fluid Musculoskeletal: Orthopedic hardware in the bilateral femurs with artifact. Treated compression deformities L1, L2, L3 and L4. IMPRESSION: 1. Moderate severe right hydronephrosis and proximal hydroureter secondary to a 10 mm stone within the proximal right ureter about 2.5 cm distal to the right UPJ. 2. Nonobstructing stone in the left kidney 3. Small right-sided pleural effusion with patchy consolidation at the right greater than left lung base which may be due to atelectasis, pneumonia, or aspiration 4. Diverticular disease of left colon without acute inflammatory process Electronically Signed   By: Donavan Foil M.D.   On: 07/19/2021 21:55   DG Chest Portable 1 View  Result Date: 07/19/2021 CLINICAL DATA:  Nausea and vomiting,  question aspiration. Mild transient hypoxia. EXAM: PORTABLE CHEST 1 VIEW COMPARISON:  Radiograph 03/27/2021 FINDINGS: Patchy airspace disease at the right lung base. No new streaky left basilar atelectasis. Normal heart size with stable mediastinal contours, aortic tortuosity. No pneumothorax, pleural effusion, or pulmonary edema. Reverse left shoulder arthroplasty. Bones diffusely under mineralized. IMPRESSION: Patchy right basilar airspace disease and left basilar atelectasis, distribution suspicious for aspiration. Electronically Signed   By: Keith Rake M.D.   On: 07/19/2021 20:38   DG C-Arm 1-60 Min-No Report  Result Date: 07/20/2021 Fluoroscopy was utilized by the requesting physician.  No radiographic interpretation.        Scheduled Meds:  [START ON 07/21/2021] amLODipine  5 mg Oral Daily   ascorbic acid  500 mg Oral Daily   calcitonin (salmon)  1 spray Alternating Nares Daily   calcium-vitamin D  1 tablet Oral BID   feeding supplement  1 Container Oral BID   fentaNYL       ferrous sulfate  325 mg Oral Q breakfast   pantoprazole  40 mg Oral Daily   polyethylene glycol  17 g Oral Daily   potassium chloride  40 mEq Oral Once   senna-docusate  1 tablet Oral QHS   simvastatin  20 mg Oral QHS   Continuous Infusions:  sodium chloride     cefTRIAXone (ROCEPHIN)  IV       LOS: 1 day    Time spent: 35 minutes    Irine Seal, MD [facility]   To contact the attending provider between 7A-7P or the covering provider during after hours 7P-7A, please log into the web site [email or web address] and access using universal [place] password for that web site. If you do not have the password, please call the hospital operator.  07/20/2021, 10:27 AM

## 2021-07-20 NOTE — Progress Notes (Signed)
No tele bed available. Pt is sleeping now. VSS. Monitor heart.  Unable to use bedpan, ptsaid felt like need to go, but could'nt void. Applied pure wick, and 50 cc urine obtained.

## 2021-07-20 NOTE — TOC Initial Note (Addendum)
Transition of Care [facility]) - Initial/Assessment Note    Patient Details  Name: Savannah Coleman MRN: [id number] Date of Birth: [birth year]  Transition of [facility]) CM/SW Contact:    Dessa Phi, RN Phone Number: 07/20/2021, 2:04 PM  Clinical Narrative: Damaris Schooner to Joelene Millin dtr about d/c plans-from [facility]-confirmed w/Tracey intake coordinator;has rw,1+asst w/adls, & ambulation, Pfizer x2,booster x2,wears bilateral hearing aids,readers,continent B/B, no dentures. PT cons-await recc.                 Expected Discharge Plan: [place] Barriers to Discharge: Continued Medical Work up   Patient Goals and CMS Choice Patient states their goals for this hospitalization and ongoing recovery are:: return back to [email or web address] Compare Post Acute Care list provided to:: Patient Represenative (must comment) Joelene Millin dtr [phone]) Choice offered to / list presented to : Adult Children  Expected Discharge Plan and Services Expected Discharge Plan: [place]   Discharge Planning Services: CM Consult Post Acute Care Choice: [place] Living arrangements for the past 2 months: [place]                                      Prior Living Arrangements/Services Living arrangements for the past 2 months: [place] Lives with:: Facility Resident Patient language and need for interpreter reviewed:: Yes Do you feel safe going back to the place where you live?: Yes      Need for Family Participation in Patient Care: No (Comment) Care giver support system in place?: Yes (comment) Current home services: DME (rw) Criminal Activity/Legal Involvement Pertinent to Current Situation/Hospitalization: No - Comment as needed  Activities of Daily Living Home Assistive Devices/Equipment: Blood pressure cuff, Grab bars in shower, Hand-held shower hose, Grab bars around toilet,  Walker (specify type), Eyeglasses (4 wheeled walker) ADL Screening (condition at time of admission) Patient's cognitive ability adequate to safely complete daily activities?: Yes Is the patient deaf or have difficulty hearing?: No Does the patient have difficulty seeing, even when wearing glasses/contacts?: No Does the patient have difficulty concentrating, remembering, or making decisions?: No Patient able to express need for assistance with ADLs?: Yes Does the patient have difficulty dressing or bathing?: Yes Independently performs ADLs?: No Communication: Independent Is this a change from baseline?: Pre-admission baseline Dressing (OT): Needs assistance Is this a change from baseline?: Pre-admission baseline Grooming: Independent Feeding: Independent Bathing: Needs assistance Is this a change from baseline?: Pre-admission baseline Toileting: Needs assistance Is this a change from baseline?: Pre-admission baseline In/Out Bed: Needs assistance Is this a change from baseline?: Pre-admission baseline Walks in Home: Needs assistance Is this a change from baseline?: Pre-admission baseline Does the patient have difficulty walking or climbing stairs?: Yes (secondary to weakness) Weakness of Legs: Both Weakness of Arms/Hands: None  Permission Sought/Granted Permission sought to share information with : Case Manager Permission granted to share information with : Yes, Verbal Permission Granted  Share Information with NAME: Case Manager     Permission granted to share info w Relationship: Kimberly dtr [phone]     Emotional Assessment Appearance:: Appears stated age Attitude/Demeanor/Rapport: Gracious Affect (typically observed): Accepting Orientation: : Oriented to Self, Oriented to Place, Oriented to  Time, Oriented to Situation Alcohol / Substance Use: Not Applicable Psych Involvement: No (comment)  Admission diagnosis:  Kidney stone [N20.0] Acute cystitis without hematuria  [N30.00]  Sepsis ([place]) [A41.9] Patient Active Problem List   Diagnosis Date Noted   Nephrolithiasis 07/20/2021   GERD (gastroesophageal reflux disease) 07/20/2021   Sepsis ([place]) 07/19/2021   Postoperative anemia due to acute blood loss 03/29/2021   Multiple fractures 03/27/2021   Hypokalemia 03/27/2021   Recurrent falls 03/27/2021   Closed fracture of neck of left femur ([place]) 03/27/2021   Closed fracture of left distal radius and ulna, initial encounter 03/27/2021   Left humeral neck fracture 03/27/2021   Essential hypertension 03/27/2021   Hyperlipidemia 03/27/2021   Resides in long term care facility 03/27/2021   Osteoporosis 03/27/2021   Closed fracture of anatomical neck of left humerus    Fall at nursing home    Closed fracture of left distal radius    Intertrochanteric fracture of right hip ([place]) 04/17/2020   Closed fracture of right distal radius 04/17/2020   Unspecified constipation 05/05/2014   Postoperative anemia due to acute blood loss 05/02/2014   Femur fracture, right ([place]) 04/30/2014   Hypertension    Hyperlipidemia    Adenomatous colon polyp    History of shingles    Rosacea    Positive PPD    Blood transfusion 03/19/2011   PCP:  Merryl Hacker, No Pharmacy:   [place], [place] [address] Phone: [phone] Fax: [place], [place] [address] Phone: [phone] Fax: [phone]     Social Determinants of Health (SDOH) Interventions    Readmission Risk Interventions No flowsheet data found.

## 2021-07-20 NOTE — Anesthesia Procedure Notes (Signed)
Procedure Name: LMA Insertion Date/Time: 07/20/2021 12:06 AM Performed by: Cleda Daub, CRNA Pre-anesthesia Checklist: Patient identified, Emergency Drugs available, Suction available and Patient being monitored Patient Re-evaluated:Patient Re-evaluated prior to induction Oxygen Delivery Method: Circle system utilized Preoxygenation: Pre-oxygenation with 100% oxygen Induction Type: IV induction LMA: LMA inserted LMA Size: 3.0 Number of attempts: 1 Placement Confirmation: positive ETCO2 Tube secured with: Tape Dental Injury: Teeth and Oropharynx as per pre-operative assessment

## 2021-07-21 ENCOUNTER — Telehealth: Payer: Self-pay

## 2021-07-21 LAB — RENAL FUNCTION PANEL
Albumin: 2.3 g/dL — ABNORMAL LOW (ref 3.5–5.0)
Anion gap: 6 (ref 5–15)
BUN: 14 mg/dL (ref 8–23)
CO2: 25 mmol/L (ref 22–32)
Calcium: 8.3 mg/dL — ABNORMAL LOW (ref 8.9–10.3)
Chloride: 108 mmol/L (ref 98–111)
Creatinine, Ser: 0.7 mg/dL (ref 0.44–1.00)
GFR, Estimated: >60 mL/min (ref >60)
Glucose, Bld: 100 mg/dL — ABNORMAL HIGH (ref 70–99)
Phosphorus: 1.3 mg/dL — ABNORMAL LOW (ref 2.5–4.6)
Potassium: 3.1 mmol/L — ABNORMAL LOW (ref 3.5–5.1)
Sodium: 139 mmol/L (ref 135–145)

## 2021-07-21 LAB — CBC WITH DIFFERENTIAL/PLATELET
Abs Immature Granulocytes: 0.18 10*3/uL — ABNORMAL HIGH (ref 0.00–0.07)
Basophils Absolute: 0.1 10*3/uL (ref 0.0–0.1)
Basophils Relative: 0 %
Eosinophils Absolute: 0.1 10*3/uL (ref 0.0–0.5)
Eosinophils Relative: 1 %
HCT: 35.1 % — ABNORMAL LOW (ref 36.0–46.0)
Hemoglobin: 11.8 g/dL — ABNORMAL LOW (ref 12.0–15.0)
Immature Granulocytes: 1 %
Lymphocytes Relative: 8 %
Lymphs Abs: 1.1 10*3/uL (ref 0.7–4.0)
MCH: 31.6 pg (ref 26.0–34.0)
MCHC: 33.6 g/dL (ref 30.0–36.0)
MCV: 93.9 fL (ref 80.0–100.0)
Monocytes Absolute: 1.9 10*3/uL — ABNORMAL HIGH (ref 0.1–1.0)
Monocytes Relative: 14 %
Neutro Abs: 10.1 10*3/uL — ABNORMAL HIGH (ref 1.7–7.7)
Neutrophils Relative %: 76 %
Platelets: 195 10*3/uL (ref 150–400)
RBC: 3.74 MIL/uL — ABNORMAL LOW (ref 3.87–5.11)
RDW: 14.6 % (ref 11.5–15.5)
WBC: 13.5 10*3/uL — ABNORMAL HIGH (ref 4.0–10.5)
nRBC: 0 % (ref 0.0–0.2)

## 2021-07-21 LAB — MAGNESIUM: Magnesium: 2.4 mg/dL (ref 1.7–2.4)

## 2021-07-21 MED ORDER — POTASSIUM PHOSPHATES 15 MMOLE/5ML IV SOLN
30.0000 mmol | Freq: Once | INTRAVENOUS | Status: AC
  Administered 2021-07-21: 30 mmol via INTRAVENOUS
  Filled 2021-07-21: qty 10

## 2021-07-21 MED ORDER — POTASSIUM CHLORIDE CRYS ER 20 MEQ PO TBCR
40.0000 meq | EXTENDED_RELEASE_TABLET | ORAL | Status: AC
Start: 2021-07-21 — End: 2021-07-21
  Administered 2021-07-21 (×2): 40 meq via ORAL
  Filled 2021-07-21 (×2): qty 2

## 2021-07-21 NOTE — Progress Notes (Signed)
PT Cancellation Note  Patient Details Name: Savannah Coleman MRN: [id number] DOB: November 22, 1931   Cancelled Treatment:    Reason Eval/Treat Not Completed: Patient declined, no reason specified Attempted to work with pt this morning and this afternoon however she declines mobility at this time due to not feeling well.  Pt reports she has to take K+ pills, and they scratch her throat and make her feel worse.  Pt again declined mobility this afternoon.  Will check back as schedule permits.   Jennesis Ramaswamy,KATHrine E 07/21/2021, 3:09 PM Jannette Spanner PT, DPT Acute Rehabilitation Services Pager: [phone] Office: [phone]

## 2021-07-21 NOTE — Progress Notes (Signed)
[facility]) Hospital Liaison note:  This patient is currently enrolled in [facility] outpatient-based Palliative Care. Will continue to follow for disposition.  Please call with any outpatient palliative questions or concerns.  Thank you, Lorelee Market, LPN [facility] Liaison [phone]

## 2021-07-21 NOTE — Telephone Encounter (Signed)
Noted Palliative care patient currently admitted to hospital. Hospital liaisons and Community Palliative team updated.

## 2021-07-21 NOTE — Evaluation (Signed)
Occupational Therapy Evaluation Patient Details Name: Savannah Coleman MRN: [id number] DOB: 1931-02-05 Today's Date: 07/21/2021    History of Present Illness patient is a 87 year old female who presented from SNF with increased temperature, R side abdominal pain. patient was admitted with sepsis, and obstructing right nephrithisis. patient underwent stent placement. PMH: bilateral THA, bilateral TSA, R IM nail femur, COPD, L total reverse shoulder, L distal radius fx   Clinical Impression   Patient is a 87 year old female who was admitted for above. Patient was previously independent at [place] level at [facility]. Currently patient is min A for transfers, mod A for LB dressing tasks and max A for hygiene tasks with min to maintain standing balance. Patient would continue to benefit from skilled OT services at this time while admitted and after d/c to address noted deficits in order to improve overall safety and independence in ADLs.      Follow Up Recommendations  SNF    Equipment Recommendations  None recommended by OT    Recommendations for Other Services       Precautions / Restrictions Precautions Precautions: None Restrictions Weight Bearing Restrictions: No      Mobility Bed Mobility Overal bed mobility: Needs Assistance Bed Mobility: Supine to Sit     Supine to sit: Mod assist;HOB elevated     General bed mobility comments: with increased time physical assist for trunk movement.    Transfers Overall transfer level: Needs assistance Equipment used: Rolling walker (2 wheeled) Transfers: Sit to/from [facility] Sit to Stand: Min assist Stand pivot transfers: Min assist            Balance Overall balance assessment: Needs assistance Sitting-balance support: Feet supported Sitting balance-Leahy Scale: Fair     Standing balance support: Bilateral upper extremity supported Standing balance-Leahy Scale: Poor                              ADL either performed or assessed with clinical judgement   ADL Overall ADL's : Needs assistance/impaired Eating/Feeding: Modified independent;Sitting   Grooming: Wash/dry face;Oral care;Set up;Sitting   Upper Body Bathing: Minimal assistance;Sitting   Lower Body Bathing: Moderate assistance;Sit to/from stand   Upper Body Dressing : Minimal assistance;Sitting   Lower Body Dressing: Moderate assistance;Sit to/from stand   Toilet Transfer: Minimal assistance;RW;Ambulation   Toileting- Clothing Manipulation and Hygiene: Maximal assistance;Sit to/from stand       Functional mobility during ADLs: Minimal assistance;Rolling walker General ADL Comments: with increased time and cues for safety.     Vision Patient Visual Report: No change from baseline       Perception     Praxis      Pertinent Vitals/Pain Pain Assessment: No/denies pain     Hand Dominance Right   Extremity/Trunk Assessment Upper Extremity Assessment Upper Extremity Assessment: LUE deficits/detail LUE Deficits / Details: decreased ROM from previous total reverse shoulder       Cervical / Trunk Assessment Cervical / Trunk Assessment: Kyphotic   Communication Communication Communication: No difficulties   Cognition Arousal/Alertness: Awake/alert Behavior During Therapy: WFL for tasks assessed/performed Overall Cognitive Status: Within Functional Limits for tasks assessed                                     General Comments       Exercises     Shoulder  Instructions      Home Living Family/patient expects to be discharged to:: Skilled nursing facility                                 Additional Comments: [facility]      Prior Functioning/Environment          Comments: Pt reported being independent with all ADLs with rollator        OT Problem List: Decreased strength;Impaired balance (sitting and/or standing);Decreased coordination;Decreased knowledge  of use of DME or AE      OT Treatment/Interventions: Self-care/ADL training;DME and/or AE instruction;Therapeutic activities;Balance training;Therapeutic exercise;Energy conservation;Patient/family education    OT Goals(Current goals can be found in the care plan section) Acute Rehab OT Goals Patient Stated Goal: to get back to SNF OT Goal Formulation: With patient Time For Goal Achievement: 08/04/21 Potential to Achieve Goals: Good  OT Frequency: Min 2X/week   Barriers to D/C:            Co-evaluation              AM-PAC OT "6 Clicks" Daily Activity     Outcome Measure Help from another person eating meals?: None Help from another person taking care of personal grooming?: A Little Help from another person toileting, which includes using toliet, bedpan, or urinal?: A Little Help from another person bathing (including washing, rinsing, drying)?: A Lot Help from another person to put on and taking off regular upper body clothing?: A Little Help from another person to put on and taking off regular lower body clothing?: A Lot 6 Click Score: 17   End of Session Equipment Utilized During Treatment: Gait belt;Rolling walker Nurse Communication: Other (comment) (cleared patient to participate.)  Activity Tolerance: Patient tolerated treatment well Patient left: in chair;with call bell/phone within reach;with chair alarm set  OT Visit Diagnosis: Muscle weakness (generalized) (M62.81);Unsteadiness on feet (R26.81)                Time: [id number] OT Time Calculation (min): 38 min Charges:  OT General Charges $OT Visit: 1 Visit OT Evaluation $OT Eval Low Complexity: 1 Low OT Treatments $Self Care/Home Management : 23-37 mins  Jackelyn Poling OTR/L, MS Acute Rehabilitation Department Office# [phone] Pager# [phone]   Merlene Morse Rogen Porte 07/21/2021, 11:03 AM

## 2021-07-21 NOTE — Progress Notes (Addendum)
PROGRESS NOTE    Savannah Coleman  [id number] DOB: 07-Jan-1931 DOA: 07/19/2021 PCP: Pcp, No    Chief Complaint  Patient presents with   Nausea   Emesis    Brief Narrative:  Is a pleasant 87 year old female history of hypertension, history of multiple fractures, COPD, GERD, hyperlipidemia who presented to the ED with nausea, vomiting, increased right-sided abdominal pain.  Patient noted to be febrile in the ED with a temp of 100.5, tachycardic, normotensive.  Chest x-ray showed right-sided opacity concerning for atelectasis versus infection.  CT abdomen and pelvis with right hydronephrosis/hydroureter with 10 mm stone, urinalysis concerning for UTI, patient with a leukocytosis with a white count of 19,000.  Patient admitted for sepsis secondary to UTI.  Urology consulted and patient taken emergently to the OR for stent placement.  Patient placed empirically on IV Rocephin.   Assessment & Plan:   Principal Problem:   Sepsis ([place]) Active Problems:   Hypertension   Hyperlipidemia   Hypokalemia   Nephrolithiasis   GERD (gastroesophageal reflux disease)   Acute cystitis without hematuria   #1 sepsis secondary to UTI and obstructing right nephrolithiasis with hydronephrosis/hydroureter -Patient on presentation met criteria for sepsis with fever, tachycardia, leukocytosis, source of infection is obstructing right nephrolithiasis with hydronephrosis/hydroureter and UTI. -Blood cultures, urine cultures pending. -Patient status post cystoscopy with right retrograde pyelogram and right ureteral stent placement with large amount of purulent eflux noted during procedure and cultures sent off. -Cultures positive for E. coli with sensitivities pending. -Leukocytosis trending down. -Continue IV Rocephin and likely transition to oral antibiotics once sensitivities have resulted and treat for total of 10 to 14 days as patient with recent instrumentation. -Continue current pain management,  supportive care. -Saline lock IV fluids. -Urology following and appreciate input and recommendations.  2.  Hypokalemia/hypomagnesemia/hypophosphatemia -K-Dur 40 mEq p.o. every 4 hours x2 doses.   -Magnesium at 2.4.   -K-Phos 30 mmol IV x1.   3.  GERD -PPI.  4.  Right-sided lung consolidation -Incidentally noted on chest x-ray and CT abdomen. -Patient currently with no respiratory symptoms.  Doubt if patient has a pneumonia. -Incentive spirometry. -Patient already on IV Rocephin secondary to problem #1.  Follow.  5.  Hypertension -Resume home regimen Norvasc.   6.  Hyperlipidemia -Continue statin.  7.  Chronic pain -Continue current home pain regimen.    DVT prophylaxis: SCDs Code Status: DNR Family Communication: Updated patient.  No family at bedside. Disposition:   Status is: Inpatient  Remains inpatient appropriate because:IV treatments appropriate due to intensity of illness or inability to take PO  Dispo: The patient is from: SNF              Anticipated d/c is to: SNF              Patient currently is not medically stable to d/c.   Difficult to place patient No       Consultants:  Urology: Dr. Gloriann Loan 07/19/2021  Procedures:  CT abdomen and pelvis 07/19/2021 Chest x-ray 07/19/2021 Cystoscopy with right retrograde pyelogram and right ureteral stent placement per Dr. Gloriann Loan, urology 07/19/2021    Antimicrobials: IV Rocephin 07/20/2021   Subjective: Laying in bed.  Overall states feeling better than she did on admission.  Denies any right lower abdominal/groin pain.  No nausea or emesis.    Objective: Vitals:   07/20/21 2105 07/21/21 0112 07/21/21 0509 07/21/21 1115  BP: (!) 155/90 (!) 152/85 (!) 147/87 (!) 152/85  Pulse: 86 82 74  83  Resp: _0 Temp: 98.4 F (36.9 C) 98.5 F (36.9 C) 97.6 F (36.4 C) 98.2 F (36.8 C)  TempSrc: Oral Oral Oral Oral  SpO2: 96% 93% 97% 97%  Weight:      Height:        Intake/Output Summary (Last 24 hours) at  07/21/2021 1302 Last data filed at 07/21/2021 0900 Gross per 24 hour  Intake 2117.57 ml  Output 1900 ml  Net 217.57 ml    Filed Weights   07/19/21 1945  Weight: 47.6 kg    Examination:  General exam: : NAD Respiratory system: CTA B.  No wheezes, no rhonchi.  Speaking in full sentences.  Normal respiratory effort. Cardiovascular system: Regular rate and rhythm no murmurs rubs or gallops.  No JVD.  No lower extremity edema.  Gastrointestinal system: Abdomen soft, nontender, nondistended, positive bowel sounds.  No rebound.  No guarding. Central nervous system: Alert and oriented. No focal neurological deficits. Extremities: Symmetric 5 x 5 power. Skin: No rashes, lesions or ulcers Psychiatry: Judgement and insight appear normal. Mood & affect appropriate.   Data Reviewed: I have personally reviewed following labs and imaging studies  CBC: Recent Labs  Lab 07/19/21 2013 07/20/21 0539 07/21/21 0457  WBC 19.0* 19.7* 13.5*  NEUTROABS 16.3*  --  10.1*  HGB 13.4 11.7* 11.8*  HCT 38.9 35.5* 35.1*  MCV 92.6 95.2 93.9  PLT 166 155 195     Basic Metabolic Panel: Recent Labs  Lab 07/19/21 2013 07/20/21 0539 07/21/21 0457  NA 137 136 139  K 2.5* 3.4* 3.1*  CL 101 105 108  CO2 _1 GLUCOSE 132* 127* 100*  BUN _2 CREATININE 0.91 0.79 0.70  CALCIUM 9.3 8.3* 8.3*  MG  --  1.6* 2.4  PHOS  --   --  1.3*     GFR: Estimated Creatinine Clearance: 37.2 mL/min (by C-G formula based on SCr of 0.7 mg/dL).  Liver Function Tests: Recent Labs  Lab 07/19/21 2013 07/21/21 0457  AST 53*  --   ALT 38  --   ALKPHOS 96  --   BILITOT 1.0  --   PROT 6.2*  --   ALBUMIN 2.7* 2.3*     CBG: No results for input(s): GLUCAP in the last 168 hours.   Recent Results (from the past 240 hour(s))  Blood culture (routine x 2)     Status: None (Preliminary result)   Collection Time: 07/19/21  8:14 PM   Specimen: BLOOD  Result Value Ref Range Status   Specimen Description    Final    BLOOD BLOOD LEFT FOREARM Performed at [facility] [address], [place], [place] [address]    Special Requests   Final    BOTTLES DRAWN AEROBIC AND ANAEROBIC Blood Culture adequate volume Performed at [facility] [address], [place], [place] [address]    Culture   Final    NO GROWTH 2 DAYS Performed at [facility] [address], [place], [place] [address]    Report Status PENDING  Incomplete  Resp Panel by RT-PCR (Flu A&B, Covid) Nasopharyngeal Swab     Status: None   Collection Time: 07/19/21  8:14 PM   Specimen: Nasopharyngeal Swab; Nasopharyngeal(NP) swabs in vial transport medium  Result Value Ref Range Status   SARS Coronavirus 2 by RT PCR NEGATIVE NEGATIVE Final    Comment: (NOTE) SARS-CoV-2 target nucleic acids are NOT  DETECTED.  The SARS-CoV-2 RNA is generally detectable in upper respiratory specimens during the acute phase of infection. The lowest concentration of SARS-CoV-2 viral copies this assay can detect is 138 copies/mL. A negative result does not preclude SARS-Cov-2 infection and should not be used as the sole basis for treatment or other patient management decisions. A negative result may occur with  improper specimen collection/handling, submission of specimen other than nasopharyngeal swab, presence of viral mutation(s) within the areas targeted by this assay, and inadequate number of viral copies(<138 copies/mL). A negative result must be combined with clinical observations, patient history, and epidemiological information. The expected result is Negative.  Fact Sheet for Patients:  EntrepreneurPulse.com.au  Fact Sheet for Healthcare Providers:  IncredibleEmployment.be  This test is no t yet approved or cleared by the [place] FDA and  has been authorized for detection and/or diagnosis of SARS-CoV-2 by FDA under an Emergency Use Authorization (EUA).  This EUA will remain  in effect (meaning this test can be used) for the duration of the COVID-19 declaration under Section 564(b)(1) of the Act, 21 U.S.C.section 360bbb-3(b)(1), unless the authorization is terminated  or revoked sooner.       Influenza A by PCR NEGATIVE NEGATIVE Final   Influenza B by PCR NEGATIVE NEGATIVE Final    Comment: (NOTE) The Xpert Xpress SARS-CoV-2/FLU/RSV plus assay is intended as an aid in the diagnosis of influenza from Nasopharyngeal swab specimens and should not be used as a sole basis for treatment. Nasal washings and aspirates are unacceptable for Xpert Xpress SARS-CoV-2/FLU/RSV testing.  Fact Sheet for Patients: EntrepreneurPulse.com.au  Fact Sheet for Healthcare Providers: IncredibleEmployment.be  This test is not yet approved or cleared by the [place] FDA and has been authorized for detection and/or diagnosis of SARS-CoV-2 by FDA under an Emergency Use Authorization (EUA). This EUA will remain in effect (meaning this test can be used) for the duration of the COVID-19 declaration under Section 564(b)(1) of the Act, 21 U.S.C. section 360bbb-3(b)(1), unless the authorization is terminated or revoked.  Performed at [facility], [place] [address], [place], [place] [address]   Blood culture (routine x 2)     Status: None (Preliminary result)   Collection Time: 07/19/21  8:19 PM   Specimen: BLOOD  Result Value Ref Range Status   Specimen Description   Final    BLOOD BLOOD RIGHT FOREARM Performed at [facility] [address] [place], [place] [address]    Special Requests   Final    BOTTLES DRAWN AEROBIC AND ANAEROBIC Blood Culture results may not be optimal due to an inadequate volume of blood received in culture bottles Performed at [facility] [address], [place], [place] [address]    Culture   Final    NO GROWTH 2 DAYS Performed at  [facility] [address], [place], [place] [address]    Report Status PENDING  Incomplete  Urine Culture     Status: Abnormal (Preliminary result)   Collection Time: 07/19/21 10:18 PM   Specimen: Urine, Clean Catch  Result Value Ref Range Status   Specimen Description   Final    URINE, CLEAN CATCH Performed at [facility], [place] [address] [place], [place] [address]    Special Requests   Final    NONE Performed at [facility], [place] [address], [place], [place] [address]    Culture (A)  Final    20,000 COLONIES/mL ESCHERICHIA COLI  SUSCEPTIBILITIES TO FOLLOW Performed at [facility], [place] [address], [place], [place] [address]    Report Status PENDING  Incomplete  Urine Culture     Status: Abnormal (Preliminary result)   Collection Time: 07/20/21 12:30 AM   Specimen: Urine, Cystoscope  Result Value Ref Range Status   Specimen Description   Final    CYSTOSCOPY Performed at [facility] [address], [place], [place] [address]    Special Requests   Final    NONE Performed at [facility], [place] [address] [place], [place] [address]    Culture (A)  Final    60,000 COLONIES/mL ESCHERICHIA COLI SUSCEPTIBILITIES TO FOLLOW Performed at [facility], [place] [address] [place], [place] [address]    Report Status PENDING  Incomplete          Radiology Studies: CT ABDOMEN PELVIS W CONTRAST  Result Date: 07/19/2021 CLINICAL DATA:  Abdomen pain UTI EXAM: CT ABDOMEN AND PELVIS WITH CONTRAST TECHNIQUE: Multidetector CT imaging of the abdomen and pelvis was performed using the standard protocol following bolus administration of intravenous contrast. CONTRAST:  62m OMNIPAQUE IOHEXOL 350 MG/ML SOLN COMPARISON:  Chest x-ray 07/19/2021 FINDINGS: Lower chest: Patchy consolidation within the right greater than left lung base. Small right pleural effusion. Normal cardiac size. Hepatobiliary:  Subcentimeter hypodensity in the right hepatic lobe too small to further characterize. Status post cholecystectomy. Enlarged extrahepatic common bile duct up to 10 mm. Pancreas: Unremarkable. No pancreatic ductal dilatation or surrounding inflammatory changes. Spleen: Normal in size without focal abnormality. Adrenals/Urinary Tract: Adrenal glands are normal. 4 mm stone lower pole left kidney. Marked right hydronephrosis and proximal hydroureter, secondary to a 10 mm stone within the proximal right ureter about 2.5 cm distal to the right UPJ. Mild wall thickening and enhancement of the proximal right ureter. No significant excretion of contrast from the right kidney on delayed views consistent with obstruction. Bladder unremarkable Stomach/Bowel: The stomach is nonenlarged. No dilated small bowel. No acute bowel wall thickening. Diverticular disease of the left colon without acute wall thickening. Vascular/Lymphatic: Mild aortic atherosclerosis without aneurysm. No suspicious nodes Reproductive: Uterus and bilateral adnexa are unremarkable. Other: Negative for free air or free fluid Musculoskeletal: Orthopedic hardware in the bilateral femurs with artifact. Treated compression deformities L1, L2, L3 and L4. IMPRESSION: 1. Moderate severe right hydronephrosis and proximal hydroureter secondary to a 10 mm stone within the proximal right ureter about 2.5 cm distal to the right UPJ. 2. Nonobstructing stone in the left kidney 3. Small right-sided pleural effusion with patchy consolidation at the right greater than left lung base which may be due to atelectasis, pneumonia, or aspiration 4. Diverticular disease of left colon without acute inflammatory process Electronically Signed   By: KDonavan FoilM.D.   On: 07/19/2021 21:55   DG Chest Portable 1 View  Result Date: 07/19/2021 CLINICAL DATA:  Nausea and vomiting, question aspiration. Mild transient hypoxia. EXAM: PORTABLE CHEST 1 VIEW COMPARISON:  Radiograph  03/27/2021 FINDINGS: Patchy airspace disease at the right lung base. No new streaky left basilar atelectasis. Normal heart size with stable mediastinal contours, aortic tortuosity. No pneumothorax, pleural effusion, or pulmonary edema. Reverse left shoulder arthroplasty. Bones diffusely under mineralized. IMPRESSION: Patchy right basilar airspace disease and left basilar atelectasis, distribution suspicious for aspiration. Electronically Signed   By: MKeith RakeM.D.   On: 07/19/2021 20:38   DG C-Arm 1-60 Min-No Report  Result Date: 07/20/2021 Fluoroscopy was utilized by the requesting  physician.  No radiographic interpretation.        Scheduled Meds:  amLODipine  5 mg Oral Daily   ascorbic acid  500 mg Oral Daily   calcitonin (salmon)  1 spray Alternating Nares Daily   calcium-vitamin D  1 tablet Oral BID   feeding supplement  1 Container Oral BID   ferrous sulfate  325 mg Oral Q breakfast   pantoprazole  40 mg Oral Daily   polyethylene glycol  17 g Oral Daily   potassium chloride  40 mEq Oral Q4H   senna-docusate  1 tablet Oral QHS   simvastatin  20 mg Oral QHS   Continuous Infusions:  sodium chloride 100 mL/hr at 07/21/21 0325   cefTRIAXone (ROCEPHIN)  IV Stopped (07/20/21 2318)   potassium PHOSPHATE IVPB (in mmol) 30 mmol (07/21/21 1014)     LOS: 2 days    Time spent: 35 minutes    Irine Seal, MD [facility]   To contact the attending provider between 7A-7P or the covering provider during after hours 7P-7A, please log into the web site [email or web address] and access using universal [place] password for that web site. If you do not have the password, please call the hospital operator.  07/21/2021, 1:02 PM

## 2021-07-22 DIAGNOSIS — N390 Urinary tract infection, site not specified: Secondary | ICD-10-CM

## 2021-07-22 DIAGNOSIS — B9620 Unspecified Escherichia coli [E. coli] as the cause of diseases classified elsewhere: Secondary | ICD-10-CM

## 2021-07-22 DIAGNOSIS — A4151 Sepsis due to Escherichia coli [E. coli]: Principal | ICD-10-CM

## 2021-07-22 LAB — RENAL FUNCTION PANEL
Albumin: 2.3 g/dL — ABNORMAL LOW (ref 3.5–5.0)
Anion gap: 6 (ref 5–15)
BUN: 9 mg/dL (ref 8–23)
CO2: 25 mmol/L (ref 22–32)
Calcium: 8.4 mg/dL — ABNORMAL LOW (ref 8.9–10.3)
Chloride: 108 mmol/L (ref 98–111)
Creatinine, Ser: 0.55 mg/dL (ref 0.44–1.00)
GFR, Estimated: >60 mL/min (ref >60)
Glucose, Bld: 92 mg/dL (ref 70–99)
Phosphorus: 2.1 mg/dL — ABNORMAL LOW (ref 2.5–4.6)
Potassium: 3.9 mmol/L (ref 3.5–5.1)
Sodium: 139 mmol/L (ref 135–145)

## 2021-07-22 LAB — URINE CULTURE
Culture: 20000 — AB
Culture: 60000 — AB

## 2021-07-22 LAB — CBC WITH DIFFERENTIAL/PLATELET
Abs Immature Granulocytes: 0.24 10*3/uL — ABNORMAL HIGH (ref 0.00–0.07)
Basophils Absolute: 0.1 10*3/uL (ref 0.0–0.1)
Basophils Relative: 0 %
Eosinophils Absolute: 0.2 10*3/uL (ref 0.0–0.5)
Eosinophils Relative: 2 %
HCT: 34.5 % — ABNORMAL LOW (ref 36.0–46.0)
Hemoglobin: 11.5 g/dL — ABNORMAL LOW (ref 12.0–15.0)
Immature Granulocytes: 2 %
Lymphocytes Relative: 13 %
Lymphs Abs: 1.5 10*3/uL (ref 0.7–4.0)
MCH: 31.2 pg (ref 26.0–34.0)
MCHC: 33.3 g/dL (ref 30.0–36.0)
MCV: 93.5 fL (ref 80.0–100.0)
Monocytes Absolute: 1.6 10*3/uL — ABNORMAL HIGH (ref 0.1–1.0)
Monocytes Relative: 14 %
Neutro Abs: 8 10*3/uL — ABNORMAL HIGH (ref 1.7–7.7)
Neutrophils Relative %: 69 %
Platelets: 225 10*3/uL (ref 150–400)
RBC: 3.69 MIL/uL — ABNORMAL LOW (ref 3.87–5.11)
RDW: 14.9 % (ref 11.5–15.5)
WBC: 11.6 10*3/uL — ABNORMAL HIGH (ref 4.0–10.5)
nRBC: 0 % (ref 0.0–0.2)

## 2021-07-22 LAB — SARS CORONAVIRUS 2 (TAT 6-24 HRS): SARS Coronavirus 2: NEGATIVE

## 2021-07-22 MED ORDER — CEFDINIR 300 MG PO CAPS: 300.0000 mg | ORAL_CAPSULE | Freq: Two times a day (BID) | ORAL | 0 refills | Status: AC

## 2021-07-22 MED ORDER — CEFDINIR 300 MG PO CAPS: 300.0000 mg | ORAL_CAPSULE | Freq: Two times a day (BID) | ORAL | Status: DC

## 2021-07-22 MED ORDER — OXYCODONE HCL 5 MG PO TABS: 5.0000 mg | ORAL_TABLET | ORAL | 0 refills | Status: AC | PRN

## 2021-07-22 NOTE — Discharge Summary (Signed)
Physician Discharge Summary  Savannah Coleman [id number] DOB: 07-Feb-1931 DOA: 07/19/2021  PCP: Pcp, No  Admit date: 07/19/2021 Discharge date: 07/22/2021  Time spent: 60 minutes  Recommendations for Outpatient Follow-up:  Patient discharged to SNF.  Follow-up with MD at SNF.  Patient will need basic metabolic profile done in 1 week to follow-up on electrolytes and renal function. Follow-up with Dr. Gloriann Loan, urology in 2 weeks for definite stone management.   Discharge Diagnoses:  Principal Problem:   Sepsis ([place]) Active Problems:   Hypertension   Hyperlipidemia   Hypokalemia   Nephrolithiasis   GERD (gastroesophageal reflux disease)   Acute cystitis without hematuria   Discharge Condition: Stable and improved.  Diet recommendation: Heart healthy  Filed Weights   07/19/21 1945  Weight: 47.6 kg    History of present illness:  HPI per Dr Alessandra Grout is a 87 y.o. female with medical history significant for hypertension, history of multiple fractures, COPD, GERD and hyperlipidemia who presents with concerns of nausea, vomiting and increasing right-sided abdominal pain.   For the past week she has noticed increasing nausea, occasional vomiting and decreased appetite with abdominal pain.  On documentation, she was noted to have been started on Augmentin last week and also given IV fluids. However she did not have much improvement in her symptoms and continues to have worsening right-sided abdominal pain.  Also notes headache.  Denies any coughing, shortness of breath or chest pain.   ED Course: She was febrile up to 100.5, tachycardic up to 113 but normotensive on room air.   Chest x-ray showed right-sided opacity concerning for infection versus atelectasis.  CT of the abdomen showing right hydronephrosis/hydroureter with 10 mm stone.  UA pending.   WBC of 19, Lact of 2.1. K OF 2.5. Creatinine is normal.    ED physician discussed with urology Dr. Gloriann Loan who emergently taken  for stent placement tonight.  Hospitalist on-call for admission.   Review of Systems: Constitutional: No Weight Change, + Fever ENT/Mouth: No sore throat, No Rhinorrhea Eyes: No Eye Pain, No Vision Changes Cardiovascular: No Chest Pain, no SOB Respiratory: No Cough, No Sputum, No Wheezing, no Dyspnea  Gastrointestinal: + Nausea, + Vomiting, No Diarrhea, No Constipation, + Pain Genitourinary: no Urinary Incontinence, No Urgency, No Flank Pain Musculoskeletal: No Arthralgias, No Myalgias Skin: No Skin Lesions, No Pruritus, Neuro: no Weakness, No Numbness Psych: No Anxiety/Panic, No Depression, no decrease appetite Heme/Lymph: No Bruising, No Bleeding.  Hospital Course:  #1 sepsis secondary to E Coli UTI and obstructing right nephrolithiasis with hydronephrosis/hydroureter -Patient on presentation met criteria for sepsis with fever, tachycardia, leukocytosis, source of infection is obstructing right nephrolithiasis with hydronephrosis/hydroureter and UTI. -Blood cultures with no growth to date. -Urine cultures were positive for E. coli. -Patient status post cystoscopy with right retrograde pyelogram and right ureteral stent placement with large amount of purulent eflux noted during procedure and cultures sent off which were positive for E. coli. -Leukocytosis trended down. -Patient maintained on IV Rocephin 2 g daily and will be discharged on 5 more days of cefdinir twice daily to complete a 7-day course of antibiotic treatment.  Antibiotic recommendations and duration was discussed with ID on day of discharge who are in agreement.  -Patient was followed by urology and will follow up with urology, Dr. Gloriann Loan in the outpatient setting for definitive management of her stone after course of antibiotic treatment.   -Patient improved clinically and will be discharged in stable and improved condition.  2.  Hypokalemia/hypomagnesemia/hypophosphatemia -Electrolytes repleted.   -Outpatient follow-up.     3.  GERD -Patient maintained on PPI.  4.  Right-sided lung consolidation -Incidentally noted on chest x-ray and CT abdomen. -Patient with no respiratory symptoms.  Doubt if patient has a pneumonia. -Incentive spirometry. -Patient already on IV Rocephin secondary to problem #1.  -No further treatment needed.    5.  Hypertension -Postoperatively patient placed back on home regimen of Norvasc.   6.  Hyperlipidemia -Patient maintained on statin.  7.  Chronic pain -Patient maintained on home pain regimen.     Procedures: CT abdomen and pelvis 07/19/2021 Chest x-ray 07/19/2021 Cystoscopy with right retrograde pyelogram and right ureteral stent placement per Dr. Gloriann Loan, urology 07/19/2021  Consultations: Urology: Dr. Gloriann Loan 07/19/2021  Discharge Exam: Vitals:   07/22/21 0505 07/22/21 1142  BP: (!) 154/89 (!) 163/91  Pulse: 89 87  Resp: 17 20  Temp: 98.6 F (37 C) 98.1 F (36.7 C)  SpO2: 94% 96%    General: NAD Cardiovascular: RRR Respiratory: CTAB  Discharge Instructions   Discharge Instructions     Diet - low sodium heart healthy   Complete by: As directed    Increase activity slowly   Complete by: As directed       Allergies as of 07/22/2021       Reactions   Morphine And Related Nausea Only        Medication List     STOP taking these medications    amoxicillin-clavulanate 875-125 MG tablet Commonly known as: AUGMENTIN   ciprofloxacin 500 MG tablet Commonly known as: CIPRO   docusate sodium 100 MG capsule Commonly known as: COLACE   enoxaparin 40 MG/0.4ML injection Commonly known as: LOVENOX       TAKE these medications    acetaminophen 325 MG tablet Commonly known as: TYLENOL Take 325 mg by mouth every 4 (four) hours as needed for moderate pain.   alum & mag hydroxide-simeth 476-546-50 MG/5ML suspension Commonly known as: MAALOX PLUS Take 30 mLs by mouth as directed. Give 30 ml once a week on Mondays & Give 30 mls by mouth every 6 hours  PRN for Indigestion, Heartburn, Gas, Nausea   amLODipine 5 MG tablet Commonly known as: NORVASC Take 1 tablet by mouth daily.   ascorbic acid 500 MG tablet Commonly known as: VITAMIN C Take 1 tablet (500 mg total) by mouth daily.   bisacodyl 5 MG EC tablet Commonly known as: DULCOLAX Take 5 mg by mouth daily as needed for moderate constipation.   calcitonin (salmon) 200 UNIT/ACT nasal spray Commonly known as: MIACALCIN/FORTICAL Place 1 spray into alternate nostrils daily.   calcium carbonate 500 MG chewable tablet Commonly known as: TUMS - dosed in mg elemental calcium Chew 1 tablet by mouth every 8 (eight) hours as needed for indigestion.   calcium-vitamin D 500-200 MG-UNIT tablet Commonly known as: OSCAL WITH D Take 1 tablet by mouth 2 (two) times daily.   cefdinir 300 MG capsule Commonly known as: OMNICEF Take 1 capsule (300 mg total) by mouth every 12 (twelve) hours for 5 days. Start taking on: July 23, 2021   feeding supplement (BOOST BREEZE) Liqd Take 1 Bottle by mouth in the morning and at bedtime. For nutritional support   ferrous sulfate 325 (65 FE) MG EC tablet Take 325 mg by mouth daily with breakfast.   melatonin 3 MG Tabs tablet Take 3 mg by mouth at bedtime as needed (sleep).   multivitamin with minerals Tabs tablet Take 1  tablet by mouth daily with lunch.   nitroGLYCERIN 0.4 MG SL tablet Commonly known as: NITROSTAT Place 0.4 mg under the tongue every 5 (five) minutes as needed for chest pain.   oxyCODONE 5 MG immediate release tablet Commonly known as: Roxicodone Take 1 tablet (5 mg total) by mouth every 4 (four) hours as needed for severe pain. What changed:  when to take this Another medication with the same name was removed. Continue taking this medication, and follow the directions you see here.   pantoprazole 40 MG tablet Commonly known as: PROTONIX Take 40 mg by mouth daily.   polyethylene glycol 17 g packet Commonly known as: MIRALAX  / GLYCOLAX Take 17 g by mouth daily.   promethazine 25 MG tablet Commonly known as: PHENERGAN Take 25 mg by mouth every 8 (eight) hours as needed for nausea.   senna-docusate 8.6-50 MG tablet Commonly known as: Senokot-S Take 1 tablet by mouth at bedtime.   simvastatin 20 MG tablet Commonly known as: ZOCOR Take 20 mg by mouth at bedtime.   THERATEARS OP Place 1 drop into both eyes daily.   tiZANidine 2 MG tablet Commonly known as: ZANAFLEX Take 2 mg by mouth every 8 (eight) hours as needed for muscle spasms.       Allergies  Allergen Reactions   Morphine And Related Nausea Only    Follow-up Information     MD AT SNF Follow up.          Lucas Mallow, MD. Schedule an appointment as soon as possible for a visit in 2 week(s).   Specialty: Urology Contact information: [place] [address] [phone]                  The results of significant diagnostics from this hospitalization (including imaging, microbiology, ancillary and laboratory) are listed below for reference.    Significant Diagnostic Studies: CT ABDOMEN PELVIS W CONTRAST  Result Date: 07/19/2021 CLINICAL DATA:  Abdomen pain UTI EXAM: CT ABDOMEN AND PELVIS WITH CONTRAST TECHNIQUE: Multidetector CT imaging of the abdomen and pelvis was performed using the standard protocol following bolus administration of intravenous contrast. CONTRAST:  63mL OMNIPAQUE IOHEXOL 350 MG/ML SOLN COMPARISON:  Chest x-ray 07/19/2021 FINDINGS: Lower chest: Patchy consolidation within the right greater than left lung base. Small right pleural effusion. Normal cardiac size. Hepatobiliary: Subcentimeter hypodensity in the right hepatic lobe too small to further characterize. Status post cholecystectomy. Enlarged extrahepatic common bile duct up to 10 mm. Pancreas: Unremarkable. No pancreatic ductal dilatation or surrounding inflammatory changes. Spleen: Normal in size without focal abnormality.  Adrenals/Urinary Tract: Adrenal glands are normal. 4 mm stone lower pole left kidney. Marked right hydronephrosis and proximal hydroureter, secondary to a 10 mm stone within the proximal right ureter about 2.5 cm distal to the right UPJ. Mild wall thickening and enhancement of the proximal right ureter. No significant excretion of contrast from the right kidney on delayed views consistent with obstruction. Bladder unremarkable Stomach/Bowel: The stomach is nonenlarged. No dilated small bowel. No acute bowel wall thickening. Diverticular disease of the left colon without acute wall thickening. Vascular/Lymphatic: Mild aortic atherosclerosis without aneurysm. No suspicious nodes Reproductive: Uterus and bilateral adnexa are unremarkable. Other: Negative for free air or free fluid Musculoskeletal: Orthopedic hardware in the bilateral femurs with artifact. Treated compression deformities L1, L2, L3 and L4. IMPRESSION: 1. Moderate severe right hydronephrosis and proximal hydroureter secondary to a 10 mm stone within the proximal right ureter about 2.5  cm distal to the right UPJ. 2. Nonobstructing stone in the left kidney 3. Small right-sided pleural effusion with patchy consolidation at the right greater than left lung base which may be due to atelectasis, pneumonia, or aspiration 4. Diverticular disease of left colon without acute inflammatory process Electronically Signed   By: Donavan Foil M.D.   On: 07/19/2021 21:55   DG Chest Portable 1 View  Result Date: 07/19/2021 CLINICAL DATA:  Nausea and vomiting, question aspiration. Mild transient hypoxia. EXAM: PORTABLE CHEST 1 VIEW COMPARISON:  Radiograph 03/27/2021 FINDINGS: Patchy airspace disease at the right lung base. No new streaky left basilar atelectasis. Normal heart size with stable mediastinal contours, aortic tortuosity. No pneumothorax, pleural effusion, or pulmonary edema. Reverse left shoulder arthroplasty. Bones diffusely under mineralized. IMPRESSION:  Patchy right basilar airspace disease and left basilar atelectasis, distribution suspicious for aspiration. Electronically Signed   By: Keith Rake M.D.   On: 07/19/2021 20:38   DG C-Arm 1-60 Min-No Report  Result Date: 07/20/2021 Fluoroscopy was utilized by the requesting physician.  No radiographic interpretation.    Microbiology: Recent Results (from the past 240 hour(s))  Blood culture (routine x 2)     Status: None (Preliminary result)   Collection Time: 07/19/21  8:14 PM   Specimen: BLOOD  Result Value Ref Range Status   Specimen Description   Final    BLOOD BLOOD LEFT FOREARM Performed at [facility] [address], [place], [place] [address]    Special Requests   Final    BOTTLES DRAWN AEROBIC AND ANAEROBIC Blood Culture adequate volume Performed at [facility] [address], [place], [place] [address]    Culture   Final    NO GROWTH 3 DAYS Performed at [facility], [place] [address], [place],  [address]    Report Status PENDING  Incomplete  Resp Panel by RT-PCR (Flu A&B, Covid) Nasopharyngeal Swab     Status: None   Collection Time: 07/19/21  8:14 PM   Specimen: Nasopharyngeal Swab; Nasopharyngeal(NP) swabs in vial transport medium  Result Value Ref Range Status   SARS Coronavirus 2 by RT PCR NEGATIVE NEGATIVE Final    Comment: (NOTE) SARS-CoV-2 target nucleic acids are NOT DETECTED.  The SARS-CoV-2 RNA is generally detectable in upper respiratory specimens during the acute phase of infection. The lowest concentration of SARS-CoV-2 viral copies this assay can detect is 138 copies/mL. A negative result does not preclude SARS-Cov-2 infection and should not be used as the sole basis for treatment or other patient management decisions. A negative result may occur with  improper specimen collection/handling, submission of specimen other than nasopharyngeal swab, presence of viral mutation(s) within the areas  targeted by this assay, and inadequate number of viral copies(<138 copies/mL). A negative result must be combined with clinical observations, patient history, and epidemiological information. The expected result is Negative.  Fact Sheet for Patients:  EntrepreneurPulse.com.au  Fact Sheet for Healthcare Providers:  IncredibleEmployment.be  This test is no t yet approved or cleared by the [place] FDA and  has been authorized for detection and/or diagnosis of SARS-CoV-2 by FDA under an Emergency Use Authorization (EUA). This EUA will remain  in effect (meaning this test can be used) for the duration of the COVID-19 declaration under Section 564(b)(1) of the Act, 21 U.S.C.section 360bbb-3(b)(1), unless the authorization is terminated  or revoked sooner.       Influenza A by PCR NEGATIVE NEGATIVE Final   Influenza B by  PCR NEGATIVE NEGATIVE Final    Comment: (NOTE) The Xpert Xpress SARS-CoV-2/FLU/RSV plus assay is intended as an aid in the diagnosis of influenza from Nasopharyngeal swab specimens and should not be used as a sole basis for treatment. Nasal washings and aspirates are unacceptable for Xpert Xpress SARS-CoV-2/FLU/RSV testing.  Fact Sheet for Patients: EntrepreneurPulse.com.au  Fact Sheet for Healthcare Providers: IncredibleEmployment.be  This test is not yet approved or cleared by the [place] FDA and has been authorized for detection and/or diagnosis of SARS-CoV-2 by FDA under an Emergency Use Authorization (EUA). This EUA will remain in effect (meaning this test can be used) for the duration of the COVID-19 declaration under Section 564(b)(1) of the Act, 21 U.S.C. section 360bbb-3(b)(1), unless the authorization is terminated or revoked.  Performed at [facility], [place] [address] [place], [place] [address]   Blood culture (routine x 2)     Status: None  (Preliminary result)   Collection Time: 07/19/21  8:19 PM   Specimen: BLOOD  Result Value Ref Range Status   Specimen Description   Final    BLOOD BLOOD RIGHT FOREARM Performed at [facility] [address], [place], [place] [address]    Special Requests   Final    BOTTLES DRAWN AEROBIC AND ANAEROBIC Blood Culture results may not be optimal due to an inadequate volume of blood received in culture bottles Performed at [facility] [address] [place], [place] [address]    Culture   Final    NO GROWTH 3 DAYS Performed at [facility], [place] [address], [place], [place] [address]    Report Status PENDING  Incomplete  Urine Culture     Status: Abnormal   Collection Time: 07/19/21 10:18 PM   Specimen: Urine, Clean Catch  Result Value Ref Range Status   Specimen Description   Final    URINE, CLEAN CATCH Performed at [facility], [place] [address] [place], [place] [address]    Special Requests   Final    NONE Performed at [facility], [place] [address], [place], [place] [address]    Culture 20,000 COLONIES/mL ESCHERICHIA COLI (A)  Final   Report Status 07/22/2021 FINAL  Final   Organism ID, Bacteria ESCHERICHIA COLI (A)  Final      Susceptibility   Escherichia coli - MIC*    AMPICILLIN >=32 RESISTANT Resistant     CEFAZOLIN <=4 SENSITIVE Sensitive     CEFEPIME <=0.12 SENSITIVE Sensitive     CEFTRIAXONE <=0.25 SENSITIVE Sensitive     CIPROFLOXACIN <=0.25 SENSITIVE Sensitive     GENTAMICIN <=1 SENSITIVE Sensitive     IMIPENEM <=0.25 SENSITIVE Sensitive     NITROFURANTOIN <=16 SENSITIVE Sensitive     TRIMETH/SULFA >=320 RESISTANT Resistant     AMPICILLIN/SULBACTAM 8 SENSITIVE Sensitive     PIP/TAZO <=4 SENSITIVE Sensitive     * 20,000 COLONIES/mL ESCHERICHIA COLI  Urine Culture     Status: Abnormal   Collection Time: 07/20/21 12:30 AM   Specimen: Urine, Cystoscope  Result Value Ref Range Status    Specimen Description   Final    CYSTOSCOPY Performed at [facility] [address] [place], [place] [address]    Special Requests   Final    NONE Performed at [facility], [place] [address] [place], [place] [address]    Culture 60,000 COLONIES/mL ESCHERICHIA COLI (A)  Final   Report Status 07/22/2021 FINAL  Final   Organism ID, Bacteria ESCHERICHIA COLI (A)  Final      Susceptibility   Escherichia coli - MIC*    AMPICILLIN >=32 RESISTANT Resistant     CEFAZOLIN <=4 SENSITIVE Sensitive     CEFEPIME <=0.12 SENSITIVE Sensitive     CEFTAZIDIME <=1 SENSITIVE Sensitive     CEFTRIAXONE <=0.25 SENSITIVE Sensitive     CIPROFLOXACIN <=0.25 SENSITIVE Sensitive     GENTAMICIN <=1 SENSITIVE Sensitive     IMIPENEM <=0.25 SENSITIVE Sensitive     TRIMETH/SULFA >=320 RESISTANT Resistant     AMPICILLIN/SULBACTAM 4 SENSITIVE Sensitive     PIP/TAZO <=4 SENSITIVE Sensitive     * 60,000 COLONIES/mL ESCHERICHIA COLI  SARS CORONAVIRUS 2 (TAT 6-24 HRS) Nasopharyngeal Nasopharyngeal Swab     Status: None   Collection Time: 07/21/21  6:33 PM   Specimen: Nasopharyngeal Swab  Result Value Ref Range Status   SARS Coronavirus 2 NEGATIVE NEGATIVE Final    Comment: (NOTE) SARS-CoV-2 target nucleic acids are NOT DETECTED.  The SARS-CoV-2 RNA is generally detectable in upper and lower respiratory specimens during the acute phase of infection. Negative results do not preclude SARS-CoV-2 infection, do not rule out co-infections with other pathogens, and should not be used as the sole basis for treatment or other patient management decisions. Negative results must be combined with clinical observations, patient history, and epidemiological information. The expected result is Negative.  Fact Sheet for Patients: SugarRoll.be  Fact Sheet for Healthcare Providers: https://www.woods-mathews.com/  This test is not yet approved or  cleared by the [place] FDA and  has been authorized for detection and/or diagnosis of SARS-CoV-2 by FDA under an Emergency Use Authorization (EUA). This EUA will remain  in effect (meaning this test can be used) for the duration of the COVID-19 declaration under Se ction 564(b)(1) of the Act, 21 U.S.C. section 360bbb-3(b)(1), unless the authorization is terminated or revoked sooner.  Performed at [facility], [place] [address] [place], [place] [address]      Labs: Basic Metabolic Panel: Recent Labs  Lab 07/19/21 2013 07/20/21 0539 07/21/21 0457 07/22/21 0454  NA 137 136 139 139  K 2.5* 3.4* 3.1* 3.9  CL 101 105 108 108  CO2 $Re'24 24 25 25  'naD$ GLUCOSE 132* 127* 100* 92  BUN $Re'18 16 14 9  'dOG$ CREATININE 0.91 0.79 0.70 0.55  CALCIUM 9.3 8.3* 8.3* 8.4*  MG  --  1.6* 2.4  --   PHOS  --   --  1.3* 2.1*   Liver Function Tests: Recent Labs  Lab 07/19/21 2013 07/21/21 0457 07/22/21 0454  AST 53*  --   --   ALT 38  --   --   ALKPHOS 96  --   --   BILITOT 1.0  --   --   PROT 6.2*  --   --   ALBUMIN 2.7* 2.3* 2.3*   Recent Labs  Lab 07/19/21 2013  LIPASE 29   No results for input(s): AMMONIA in the last 168 hours. CBC: Recent Labs  Lab 07/19/21 2013 07/20/21 0539 07/21/21 0457 07/22/21 0454  WBC 19.0* 19.7* 13.5* 11.6*  NEUTROABS 16.3*  --  10.1* 8.0*  HGB 13.4 11.7* 11.8* 11.5*  HCT 38.9 35.5* 35.1* 34.5*  MCV 92.6 95.2 93.9 93.5  PLT 166 155 195 225   Cardiac Enzymes: No results for input(s): CKTOTAL, CKMB, CKMBINDEX, TROPONINI in the last 168 hours. BNP: BNP (last 3 results) No results for input(s): BNP in the last  8760 hours.  ProBNP (last 3 results) No results for input(s): PROBNP in the last 8760 hours.  CBG: No results for input(s): GLUCAP in the last 168 hours.     Signed:  Irine Seal MD.  [facility] 07/22/2021, 3:14 PM

## 2021-07-22 NOTE — Care Management Important Message (Signed)
Important Message  Patient Details IM Letter placed in Patient's room. Name: Savannah Coleman MRN: [id number] Date of Birth: 1931/09/18   Medicare Important Message Given:  Yes     Kerin Salen 07/22/2021, 10:58 AM

## 2021-07-22 NOTE — Evaluation (Signed)
Physical Therapy Evaluation Patient Details Name: Savannah Coleman MRN: [id number] DOB: 08/09/1931 Today's Date: 07/22/2021   History of Present Illness  Patient is an 87 year old female who presented from SNF with nausea, vomiting, increased right-sided abdominal pain. Patient was admitted with sepsis, and obstructing right nephrithisis. patient underwent stent placement. PMH: bilateral THA, bilateral TSA, R IM nail femur, COPD, L total reverse shoulder, L distal radius fx  Clinical Impression  Pt admitted with above diagnosis. Pt currently with functional limitations due to the deficits listed below (see PT Problem List). Pt will benefit from skilled PT to increase their independence and safety with mobility to allow discharge to the venue listed below.  Pt only agreeable to ambulate to/from bathroom.  Pt requesting assist for tasks due to weakness and fatigue.  Pt from SNF so will defer therapy needs to SNF, however pt is currently mod assist to mobilize.     Follow Up Recommendations SNF    Equipment Recommendations  None recommended by PT    Recommendations for Other Services       Precautions / Restrictions Precautions Precautions: Fall Restrictions Weight Bearing Restrictions: No      Mobility  Bed Mobility Overal bed mobility: Needs Assistance Bed Mobility: Supine to Sit;Sit to Supine     Supine to sit: Mod assist;HOB elevated Sit to supine: Mod assist   General bed mobility comments: increased time and required assist for trunk upright and LEs onto bed    Transfers Overall transfer level: Needs assistance Equipment used: Rolling walker (2 wheeled) Transfers: Sit to/from Stand Sit to Stand: Mod assist         General transfer comment: assist for rise and steady; pt reports "bad" left shoulder and requesting assist  Ambulation/Gait Ambulation/Gait assistance: Min assist Gait Distance (Feet): 10 Feet (x2) Assistive device: Rolling walker (2 wheeled) Gait  Pattern/deviations: Step-through pattern;Decreased stride length     General Gait Details: verbal cues for RW positioning, pt only agreeable to ambulate to/from bathroom  Stairs            Wheelchair Mobility    Modified Rankin (Stroke Patients Only)       Balance Overall balance assessment: Needs assistance         Standing balance support: Bilateral upper extremity supported Standing balance-Leahy Scale: Poor                               Pertinent Vitals/Pain Pain Assessment: No/denies pain    Home Living Family/patient expects to be discharged to:: Skilled nursing facility                 Additional Comments: [facility]    Prior Function Level of Independence: Needs assistance   Gait / Transfers Assistance Needed: Ambulated with rollator     Comments: Pt reported being independent with all ADLs with rollator     Hand Dominance        Extremity/Trunk Assessment   Upper Extremity Assessment LUE Deficits / Details: decreased ROM from previous total reverse shoulder    Lower Extremity Assessment Lower Extremity Assessment: Generalized weakness    Cervical / Trunk Assessment Cervical / Trunk Assessment: Kyphotic  Communication   Communication: No difficulties  Cognition Arousal/Alertness: Awake/alert Behavior During Therapy: WFL for tasks assessed/performed Overall Cognitive Status: Within Functional Limits for tasks assessed  General Comments      Exercises     Assessment/Plan    PT Assessment Patient needs continued PT services  PT Problem List Decreased strength;Decreased mobility;Decreased activity tolerance;Decreased balance;Decreased knowledge of use of DME       PT Treatment Interventions DME instruction;Gait training;Balance training;Therapeutic exercise;Functional mobility training;Therapeutic activities;Patient/family education    PT Goals (Current  goals can be found in the Care Plan section)  Acute Rehab PT Goals PT Goal Formulation: With patient Time For Goal Achievement: 08/05/21 Potential to Achieve Goals: Good    Frequency Min 2X/week   Barriers to discharge        Co-evaluation               AM-PAC PT "6 Clicks" Mobility  Outcome Measure Help needed turning from your back to your side while in a flat bed without using bedrails?: A Little Help needed moving from lying on your back to sitting on the side of a flat bed without using bedrails?: A Lot Help needed moving to and from a bed to a chair (including a wheelchair)?: A Lot Help needed standing up from a chair using your arms (e.g., wheelchair or bedside chair)?: A Lot Help needed to walk in hospital room?: A Lot Help needed climbing 3-5 steps with a railing? : Total 6 Click Score: 12    End of Session Equipment Utilized During Treatment: Gait belt Activity Tolerance: Patient tolerated treatment well Patient left: with call bell/phone within reach;in bed   PT Visit Diagnosis: Other abnormalities of gait and mobility (R26.89)    Time: [id number] PT Time Calculation (min) (ACUTE ONLY): 22 min   Charges:   PT Evaluation $PT Eval Low Complexity: 1 Low     Kati PT, DPT Acute Rehabilitation Services Pager: [phone] Office: [phone]   York Ram E 07/22/2021, 12:40 PM

## 2021-07-22 NOTE — Plan of Care (Signed)

## 2021-07-22 NOTE — TOC Transition Note (Signed)
Transition of Care [facility]) - CM/SW Discharge Note   Patient Details  Name: Savannah Coleman MRN: [id number] Date of Birth: [birth year]  [facility] CM/SW Contact:  Ross Ludwig, LCSW Phone Number: 07/22/2021, 4:54 PM   Clinical Narrative:     Patient to be d/c'ed today to [place].  Patient and family agreeable to plans will transport via ems RN to call report to [phone].  Daughter Joelene Millin is aware that patient discharging today.   Final next level of care: Skilled Nursing Facility Barriers to Discharge: Barriers Resolved   Patient Goals and CMS Choice Patient states their goals for this hospitalization and ongoing recovery are:: To return to SNF and continue with therapy. CMS Medicare.gov Compare Post Acute Care list provided to:: Patient Represenative (must comment) Choice offered to / list presented to : Adult Children  Discharge Placement   Existing PASRR number confirmed : 07/22/21          Patient chooses bed at: [facility] Patient to be transferred to facility by: [facility] Name of family member notified: Patient daughter Joelene Millin Patient and family notified of of transfer: 07/22/21  Discharge Plan and Services   Discharge Planning Services: CM Consult Post Acute Care Choice: [place]                               Social Determinants of Health (SDOH) Interventions     Readmission Risk Interventions No flowsheet data found.

## 2021-07-24 LAB — CULTURE, BLOOD (ROUTINE X 2)
Culture: NO GROWTH
Culture: NO GROWTH
Special Requests: ADEQUATE

## 2021-07-25 ENCOUNTER — Other Ambulatory Visit: Payer: Self-pay | Admitting: Urology

## 2021-07-28 ENCOUNTER — Other Ambulatory Visit: Payer: Self-pay | Admitting: Urology

## 2021-08-02 NOTE — Patient Instructions (Addendum)
Preop instructions for:   Savannah Coleman   Date of Birth:   12-15-1931                    Date of Procedure:   08/05/2021 Procedure:  CYSTOSCOPY BILATERAL URETEROSCOPY/HOLMIUM LASER/STENT PLACEMENT    Surgeon:  Dr. Link Snuffer Facility contact: [facility]    Phone:    [place]:  Francille Burchell, [id number] RN contact name/phone#:   Lynelle Smoke, Nurse                       and Fax #:  [phone]   Transportation contact phone#:     Time to arrive at [facility]: 12 Noon   Report to: Admitting (On your left hand side)    Do not eat or drink past midnight the night before your procedure.(To include any tube feedings-must be discontinued)   Take these morning medications only with sips of water.(or give through gastrostomy or feeding tube). Amlodipine, Pantoprazole, Oxycodone if needed   May use nasal spray and eye drops the morning of procedure   Please send day of procedure:current med list and meds last taken that day, confirm nothing by mouth status from what time, Patient Demographic info( to include DNR status, problem list, allergies)   Bring Insurance card and picture ID Leave all jewelry and other valuables at place where living( no metal or rings to be worn) No contact lens Women-no make-up, no lotions,perfumes,powders   Any questions day of procedure,call  SHORT STAY-[phone]   Sent from :[facility]                   [place]                   Fax:[phone]   Sent by :  Norvel Richards, BSN, RN

## 2021-08-02 NOTE — Progress Notes (Signed)
For Short Stay: [place] appointment date:N/A Date of COVID positive in last 69 days: No   For Anesthesia: PCP -  Cardiologist -   Chest x-ray - 07/19/2021 in epic EKG - 03/27/21 in epic Stress Test -  ECHO - greater than 2 years in epic Cardiac Cath -  Pacemaker/ICD device last checked:  Sleep Study - N/A CPAP - N/A  Fasting Blood Sugar - N/A Checks Blood Sugar __N/A___ times a day  Blood Thinner Instructions: N/A Aspirin Instructions: N/A Last Dose: N/A  Activity level:  Can go up a flight of stairs and activities of daily living without stopping and without symptoms       Anesthesia review: COPD,  Patient denies shortness of breath, fever, cough and chest pain at PAT appointment   Patient verbalized understanding of instructions that were given to them at the PAT appointment. Patient was also instructed that they will need to review over the PAT instructions again at home before surgery.

## 2021-08-04 NOTE — Progress Notes (Signed)
Spoke to patient's daughter Santiago Glad who stated that patient will be transported by [facility] day of surgery and her sister Maudie Mercury will meet her at the hospital.  Patient will arrive at 1200.

## 2021-08-05 ENCOUNTER — Ambulatory Visit (HOSPITAL_COMMUNITY): Payer: Medicare Other | Admitting: Registered Nurse

## 2021-08-05 ENCOUNTER — Encounter (HOSPITAL_COMMUNITY)
Admission: RE | Disposition: A | Discharge status: Home / Self Care | Payer: Self-pay | Source: Home / Self Care | Attending: Urology

## 2021-08-05 ENCOUNTER — Ambulatory Visit (HOSPITAL_COMMUNITY)
Admission: RE | Admit: 2021-08-05 | Discharge: 2021-08-05 | Disposition: A | Discharge status: Home / Self Care | Payer: Medicare Other | Attending: Urology | Admitting: Urology

## 2021-08-05 ENCOUNTER — Encounter (HOSPITAL_COMMUNITY): Payer: Self-pay | Admitting: Urology

## 2021-08-05 ENCOUNTER — Ambulatory Visit (HOSPITAL_COMMUNITY): Payer: Medicare Other

## 2021-08-05 DIAGNOSIS — Z825 Family history of asthma and other chronic lower respiratory diseases: Secondary | ICD-10-CM | POA: Diagnosis not present

## 2021-08-05 DIAGNOSIS — N136 Pyonephrosis: Secondary | ICD-10-CM | POA: Insufficient documentation

## 2021-08-05 DIAGNOSIS — J90 Pleural effusion, not elsewhere classified: Secondary | ICD-10-CM | POA: Diagnosis not present

## 2021-08-05 DIAGNOSIS — N132 Hydronephrosis with renal and ureteral calculous obstruction: Secondary | ICD-10-CM | POA: Diagnosis present

## 2021-08-05 DIAGNOSIS — Z885 Allergy status to narcotic agent status: Secondary | ICD-10-CM | POA: Diagnosis not present

## 2021-08-05 HISTORY — PX: CYSTOSCOPY/URETEROSCOPY/HOLMIUM LASER/STENT PLACEMENT: SHX6546

## 2021-08-05 SURGERY — CYSTOSCOPY/URETEROSCOPY/HOLMIUM LASER/STENT PLACEMENT
Anesthesia: General | Laterality: Bilateral

## 2021-08-05 MED ORDER — LIDOCAINE 2% (20 MG/ML) 5 ML SYRINGE
INTRAMUSCULAR | Status: AC
  Filled 2021-08-05: qty 5

## 2021-08-05 MED ORDER — AMISULPRIDE (ANTIEMETIC) 5 MG/2ML IV SOLN: 10.0000 mg | Freq: Once | INTRAVENOUS | Status: DC | PRN

## 2021-08-05 MED ORDER — ORAL CARE MOUTH RINSE: 15.0000 mL | Freq: Once | OROMUCOSAL | Status: AC

## 2021-08-05 MED ORDER — AMOXICILLIN-POT CLAVULANATE 875-125 MG PO TABS: 1.0000 | ORAL_TABLET | Freq: Two times a day (BID) | ORAL | 0 refills | Status: AC

## 2021-08-05 MED ORDER — LACTATED RINGERS IV SOLN: INTRAVENOUS | Status: DC

## 2021-08-05 MED ORDER — PROPOFOL 10 MG/ML IV BOLUS
INTRAVENOUS | Status: AC
  Filled 2021-08-05: qty 20

## 2021-08-05 MED ORDER — EPHEDRINE 5 MG/ML INJ
INTRAVENOUS | Status: AC
  Filled 2021-08-05: qty 10

## 2021-08-05 MED ORDER — DEXAMETHASONE SODIUM PHOSPHATE 4 MG/ML IJ SOLN
INTRAMUSCULAR | Status: DC | PRN
  Administered 2021-08-05: 4 mg via INTRAVENOUS

## 2021-08-05 MED ORDER — FENTANYL CITRATE (PF) 100 MCG/2ML IJ SOLN
INTRAMUSCULAR | Status: DC | PRN
  Administered 2021-08-05 (×4): 25 ug via INTRAVENOUS

## 2021-08-05 MED ORDER — FENTANYL CITRATE (PF) 100 MCG/2ML IJ SOLN
INTRAMUSCULAR | Status: AC
  Filled 2021-08-05: qty 2

## 2021-08-05 MED ORDER — SODIUM CHLORIDE 0.9 % IV SOLN
2.0000 g | INTRAVENOUS | Status: AC
  Administered 2021-08-05: 2 g via INTRAVENOUS
  Filled 2021-08-05: qty 20

## 2021-08-05 MED ORDER — ONDANSETRON HCL 4 MG/2ML IJ SOLN: 4.0000 mg | Freq: Once | INTRAMUSCULAR | Status: DC | PRN

## 2021-08-05 MED ORDER — SODIUM CHLORIDE 0.9 % IR SOLN
Status: DC | PRN
  Administered 2021-08-05 (×2): 3000 mL via INTRAVESICAL

## 2021-08-05 MED ORDER — LIDOCAINE HCL (CARDIAC) PF 100 MG/5ML IV SOSY
PREFILLED_SYRINGE | INTRAVENOUS | Status: DC | PRN
  Administered 2021-08-05: 60 mg via INTRAVENOUS

## 2021-08-05 MED ORDER — ONDANSETRON HCL 4 MG/2ML IJ SOLN
INTRAMUSCULAR | Status: DC | PRN
  Administered 2021-08-05: 4 mg via INTRAVENOUS

## 2021-08-05 MED ORDER — CHLORHEXIDINE GLUCONATE 0.12 % MT SOLN
15.0000 mL | Freq: Once | OROMUCOSAL | Status: AC
  Administered 2021-08-05: 15 mL via OROMUCOSAL

## 2021-08-05 MED ORDER — IOHEXOL 300 MG/ML  SOLN
INTRAMUSCULAR | Status: DC | PRN
  Administered 2021-08-05: 10 mL

## 2021-08-05 MED ORDER — PROPOFOL 10 MG/ML IV BOLUS
INTRAVENOUS | Status: DC | PRN
  Administered 2021-08-05: 150 mg via INTRAVENOUS

## 2021-08-05 MED ORDER — DEXAMETHASONE SODIUM PHOSPHATE 10 MG/ML IJ SOLN
INTRAMUSCULAR | Status: AC
  Filled 2021-08-05: qty 1

## 2021-08-05 MED ORDER — ONDANSETRON HCL 4 MG/2ML IJ SOLN
INTRAMUSCULAR | Status: AC
  Filled 2021-08-05: qty 2

## 2021-08-05 MED ORDER — FENTANYL CITRATE (PF) 100 MCG/2ML IJ SOLN: 25.0000 ug | INTRAMUSCULAR | Status: DC | PRN

## 2021-08-05 MED ORDER — EPHEDRINE SULFATE 50 MG/ML IJ SOLN
INTRAMUSCULAR | Status: DC | PRN
  Administered 2021-08-05 (×2): 5 mg via INTRAVENOUS

## 2021-08-05 MED ORDER — 0.9 % SODIUM CHLORIDE (POUR BTL) OPTIME
TOPICAL | Status: DC | PRN
  Administered 2021-08-05: 1000 mL

## 2021-08-05 MED ORDER — ACETAMINOPHEN 10 MG/ML IV SOLN: 750.0000 mg | Freq: Once | INTRAVENOUS | Status: DC | PRN

## 2021-08-05 SURGICAL SUPPLY — 21 items
BAG URO CATCHER STRL LF (MISCELLANEOUS) ×2 IMPLANT
BASKET LASER NITINOL 1.9FR (BASKET) IMPLANT
BASKET ZERO TIP NITINOL 2.4FR (BASKET) IMPLANT
CATH INTERMIT  6FR 70CM (CATHETERS) ×2 IMPLANT
CLOTH BEACON ORANGE TIMEOUT ST (SAFETY) ×2 IMPLANT
EXTRACTOR STONE 1.7FRX115CM (UROLOGICAL SUPPLIES) IMPLANT
GLOVE SURG ENC MOIS LTX SZ7.5 (GLOVE) ×2 IMPLANT
GOWN STRL REUS W/TWL XL LVL3 (GOWN DISPOSABLE) ×2 IMPLANT
GUIDEWIRE ANG ZIPWIRE 038X150 (WIRE) IMPLANT
GUIDEWIRE STR DUAL SENSOR (WIRE) ×4 IMPLANT
KIT TURNOVER KIT A (KITS) ×2 IMPLANT
LASER FIB FLEXIVA PULSE ID 365 (Laser) IMPLANT
MANIFOLD NEPTUNE II (INSTRUMENTS) ×2 IMPLANT
PACK CYSTO (CUSTOM PROCEDURE TRAY) ×2 IMPLANT
SHEATH URETERAL 12FRX28CM (UROLOGICAL SUPPLIES) IMPLANT
SHEATH URETERAL 12FRX35CM (MISCELLANEOUS) ×2 IMPLANT
STENT URET 6FRX24 CONTOUR (STENTS) ×2 IMPLANT
TRACTIP FLEXIVA PULS ID 200XHI (Laser) ×1 IMPLANT
TRACTIP FLEXIVA PULSE ID 200 (Laser) ×1
TUBING CONNECTING 10 (TUBING) ×2 IMPLANT
TUBING UROLOGY SET (TUBING) ×2 IMPLANT

## 2021-08-05 NOTE — Discharge Instructions (Signed)

## 2021-08-05 NOTE — Anesthesia Procedure Notes (Signed)
Procedure Name: LMA Insertion Date/Time: 08/05/2021 2:48 PM Performed by: Lavina Hamman, CRNA Pre-anesthesia Checklist: Patient identified, Emergency Drugs available, Suction available and Patient being monitored Patient Re-evaluated:Patient Re-evaluated prior to induction Oxygen Delivery Method: Circle System Utilized Preoxygenation: Pre-oxygenation with 100% oxygen Induction Type: IV induction Ventilation: Mask ventilation without difficulty LMA: LMA inserted LMA Size: 3.0 Number of attempts: 1 Airway Equipment and Method: Bite block Placement Confirmation: positive ETCO2 Tube secured with: Tape Dental Injury: Teeth and Oropharynx as per pre-operative assessment

## 2021-08-05 NOTE — Transfer of Care (Signed)
Immediate Anesthesia Transfer of Care Note  Patient: Savannah Coleman  Procedure(s) Performed: Procedure(s): CYSTOSCOPY BILATERAL URETEROSCOPY/HOLMIUM LASER/STENT PLACEMENT/BILATERAL RETROGRADE PYELOGRAM (Bilateral)  Patient Location: PACU  Anesthesia Type:General  Level of Consciousness:  sedated, patient cooperative and responds to stimulation  Airway & Oxygen Therapy:Patient Spontanous Breathing and Patient connected to face mask oxgen  Post-op Assessment:  Report given to PACU RN and Post -op Vital signs reviewed and stable  Post vital signs:  Reviewed and stable  Last Vitals:  Vitals:   08/05/21 1240  BP: (!) 153/83  Pulse: 79  Resp: 16  Temp: 36.8 C  SpO2: 0000000    Complications: No apparent anesthesia complications

## 2021-08-05 NOTE — Op Note (Signed)
Operative Note  Preoperative diagnosis:  1.  Right ureteral calculus, left renal calculus  Postoperative diagnosis: 1.  Right ureteral calculus, left renal calculus  Procedure(s): 1.  Cystoscopy with bilateral retrograde pyelogram, bilateral ureteroscopy with laser lithotripsy, right stent exchange, left ureteral stent placement  Surgeon: Link Snuffer, MD  Assistants: None  Anesthesia: General  Complications: None immediate  EBL: Minimal  Specimens: 1.  None  Drains/Catheters: 1.  6 x 24 double-J ureteral stent bilaterally  Intraoperative findings: 1.  Normal urethra and bladder 2.  Approximately 1 cm ureteropelvic junction calculus on the right and an approximately 6 mm calculus on the left. 3.  Bilateral retrograde pyelogram revealed some fullness in bilateral renal pelvises but no obvious filling defect after removal of stones  Indication: 87 year old female with an obstructing right ureteropelvic junction calculus/ureteral calculus status post urgent ureteral stent placement for sepsis who also has a small left renal calculus and desired intervention for that.  She presents for definitive management of her stones.  Description of procedure:  The patient was identified and consent was obtained.  The patient was taken to the operating room and placed in the supine position.  The patient was placed under general anesthesia.  Perioperative antibiotics were administered.  The patient was placed in dorsal lithotomy.  Patient was prepped and draped in a standard sterile fashion and a timeout was performed.  A 21 French rigid cystoscope was advanced into the urethra and into the bladder.  Complete cystoscopy was performed with the findings noted above.  The stent was grasped and pulled just beyond the meatus.  A sensor wire was advanced through it up to kidney under fluoroscopic guidance and the stent was withdrawn.  The wire was secured to the drape as a safety wire.  Semirigid  ureteroscopy was performed and there was no stone all the way up to the renal pelvis.  I passed a second wire through the scope.  I then advanced a 12 x 14 ureteral access sheath over the wire under continuous fluoroscopic guidance which passed easily up to the renal pelvis.  The inner sheath and wire were withdrawn.  Digital ureteroscopy was performed and the 1 cm calculus was fragmented on dust settings to tiny fragments.  The stone was very soft.  There were some matrix in the kidney that was broken up as well.  There was nothing to basket after lithotripsy.  I shot a retrograde pyelogram through the scope with findings noted above.  I removed the scope along with the access sheath visualizing the entire ureter upon removal.  There is no obvious ureteral injury and no ureteral stones.  I backloaded the wire onto the rigid cystoscope followed by routine placement of a 6 x 24 double-J ureteral stent.  Fluoroscopy confirmed proximal placement and direct visualization confirmed a good coil within the bladder.  A sensor wire was advanced up the left ureter and into the kidney under fluoroscopic guidance.  A second wire was advanced alongside this into the kidney.  1 the wires was secured to the drape and the other wire was used to advance a 12 x 14 ureteral access sheath over the wire under continuous fluoroscopic guidance.  Digital ureteroscopy was performed and identified the stone of interest which was laser fragmented on dust settings to tiny fragments.  There were no fragments to basket.  There were no other clinically significant stone fragments seen.  I shot a retrograde pyelogram through the scope with the findings noted above.  I withdrew the scope along with the access sheath visualizing the entire ureter upon removal.  There was some mild ureteral trauma from sheath placement but not a full-thickness injury.  There was just superficial mucosal trauma.  I backloaded the wire onto rigid cystoscope followed  by routine placement of a 6 x 24 double-J ureteral stent.  Fluoroscopy confirmed proximal placement and direct visualization confirmed a good coil within the bladder.  I drained the bladder and withdrew the scope.  Patient tolerated the procedure well was stable postoperative.  Plan: Follow-up in 1 week for stent removal

## 2021-08-05 NOTE — Anesthesia Preprocedure Evaluation (Signed)
Anesthesia Evaluation  Patient identified by MRN, date of birth, ID band Patient awake    Reviewed: Allergy & Precautions, NPO status , Patient's Chart, lab work & pertinent test results  Airway Mallampati: II  TM Distance: >3 FB Neck ROM: Full    Dental no notable dental hx.    Pulmonary COPD,    Pulmonary exam normal breath sounds clear to auscultation       Cardiovascular hypertension, Pt. on medications Normal cardiovascular exam Rhythm:Regular Rate:Normal     Neuro/Psych Anxiety negative neurological ROS     GI/Hepatic Neg liver ROS, GERD  Medicated and Controlled,  Endo/Other  negative endocrine ROS  Renal/GU negative Renal ROS     Musculoskeletal  (+) Arthritis ,   Abdominal   Peds  Hematology HLD   Anesthesia Other Findings BILATERAL  URETERAL STONE  Reproductive/Obstetrics                             Anesthesia Physical Anesthesia Plan  ASA: 2  Anesthesia Plan: General   Post-op Pain Management:    Induction: Intravenous  PONV Risk Score and Plan: 3 and Ondansetron, Dexamethasone and Treatment may vary due to age or medical condition  Airway Management Planned: LMA  Additional Equipment:   Intra-op Plan:   Post-operative Plan: Extubation in OR  Informed Consent: I have reviewed the patients History and Physical, chart, labs and discussed the procedure including the risks, benefits and alternatives for the proposed anesthesia with the patient or authorized representative who has indicated his/her understanding and acceptance.     Dental advisory given  Plan Discussed with: CRNA  Anesthesia Plan Comments:         Anesthesia Quick Evaluation

## 2021-08-05 NOTE — Interval H&P Note (Signed)
History and Physical Interval Note:  08/05/2021 2:15 PM  Savannah Coleman  has presented today for surgery, with the diagnosis of BILATERAL  URETERAL STONE.  The various methods of treatment have been discussed with the patient and family. After consideration of risks, benefits and other options for treatment, the patient has consented to  Procedure(s): CYSTOSCOPY BILATERAL URETEROSCOPY/HOLMIUM LASER/STENT PLACEMENT (Bilateral) as a surgical intervention.  The patient's history has been reviewed, patient examined, no change in status, stable for surgery.  I have reviewed the patient's chart and labs.  Questions were answered to the patient's satisfaction.     Marton Redwood, III

## 2021-08-06 NOTE — Anesthesia Postprocedure Evaluation (Signed)
Anesthesia Post Note  Patient: DODI WRAGGE  Procedure(s) Performed: CYSTOSCOPY BILATERAL URETEROSCOPY/HOLMIUM LASER/STENT PLACEMENT/BILATERAL RETROGRADE PYELOGRAM (Bilateral)     Patient location during evaluation: PACU Anesthesia Type: General Level of consciousness: awake Pain management: pain level controlled Vital Signs Assessment: post-procedure vital signs reviewed and stable Respiratory status: spontaneous breathing, nonlabored ventilation, respiratory function stable and patient connected to nasal cannula oxygen Cardiovascular status: blood pressure returned to baseline and stable Postop Assessment: no apparent nausea or vomiting Anesthetic complications: no   No notable events documented.  Last Vitals:  Vitals:   08/05/21 1615 08/05/21 1630  BP: (!) 174/83 (!) 178/88  Pulse: 72 77  Resp: 16 20  Temp:  36.4 C  SpO2: 94% 95%    Last Pain:  Vitals:   08/05/21 1630  TempSrc:   PainSc: 0-No pain                 Daisie Haft P Avana Kreiser

## 2021-08-07 ENCOUNTER — Encounter (HOSPITAL_COMMUNITY): Payer: Self-pay | Admitting: Urology

## 2021-10-18 ENCOUNTER — Non-Acute Institutional Stay: Payer: Medicare Other | Admitting: Hospice

## 2021-10-18 ENCOUNTER — Other Ambulatory Visit: Payer: Self-pay

## 2021-10-18 DIAGNOSIS — Z515 Encounter for palliative care: Secondary | ICD-10-CM

## 2021-10-18 DIAGNOSIS — G8929 Other chronic pain: Secondary | ICD-10-CM

## 2021-10-18 DIAGNOSIS — K5901 Slow transit constipation: Secondary | ICD-10-CM

## 2021-10-18 DIAGNOSIS — M25552 Pain in left hip: Secondary | ICD-10-CM

## 2021-10-18 DIAGNOSIS — M25512 Pain in left shoulder: Secondary | ICD-10-CM

## 2021-10-18 NOTE — Progress Notes (Signed)
[facility] Consult Note Telephone: [phone]  Fax: [phone]  PATIENT NAME: Savannah Coleman DOB: [birth year] MRN: [id number]  PRIMARY CARE PROVIDER:   Dr. Leanna Battles   REFERRING PROVIDER:   Dr. Leanna Battles   RESPONSIBLE PARTY:   Daughter - Santiago Glad [phone] Contact Information     Name Relation Home Work Mobile   Segal,Kimberly Daughter [phone]  [phone]   Eeva, Schlosser Daughter   [phone]       Visit is to build trust and highlight Palliative Medicine as specialized medical care for people living with serious illness, aimed at facilitating better quality of life through symptoms relief, assisting with advance care planning and complex medical decision making. This is a follow up visit.  RECOMMENDATIONS/PLAN:   Advance Care Planning/Code Status: Patient is a DO NOT RESUSCITATE  Goals of Care: Goals of care include to maximize quality of life and symptom management.  Visit consisted of counseling and education dealing with the complex and emotionally intense issues of symptom management and palliative care in the setting of serious and potentially life-threatening illness.  Patient continues to draw strength from [employer].  Palliative care team will continue to support patient, patient's family, and medical team.  Symptom management/Plan:  Pain to left shoulder and left hip: Managed with Tylenol, oxycodone and tizanidine as ordered. Effective Gait imbalance: Patient is a high fall risk.  Falls precautions in place.  Patient ambulates with rolling walker, supervision only.  Continue PT OT Constipation: Managed with bisacodyl and MiraLAX as ordered.  Encourage mobility/activity, adequate hydration and incorporate fruits and vegetables in meals.  Follow up: Palliative care will continue to follow for complex medical decision making, advance care planning, and clarification of goals. Return 6 weeks or prn.  Encouraged to call provider sooner with any concerns.  CHIEF COMPLAINT: Palliative follow up  HISTORY OF PRESENT ILLNESS:  Savannah Coleman a 87 y.o. female with multiple medical problems including left shoulder pain, left hip pain related to multiple fractures- L proximal humerus, impacted L basicervical femur, L distal radius and ulna with subsequent cephalomedullary nailing of the left hip and left reverse shoulder arthroplasty.  History COPD, HLD, HTN, R hip arthroplasty, lumbar fusion, bilateral wrist surgery. Patient successfully underwent cystoscopy with ureteroscopy and stent placement August 2022.  Patient in no distress, pleasant and cooperative during visit; she reports enjoying services at the facility and overall doing well at this time.  History obtained from review of EMR, discussion with primary team, family and/or patient. Records reviewed and summarized above. All 10 point systems reviewed and are negative except as documented in history of present illness above  Review and summarization of Epic records shows history from other than patient.   Palliative Care was asked to follow this patient o help address complex decision making in the context of advance care planning and goals of care clarification.   PHYSICAL EXAM  General: In no acute distress, appropriately dressed, cooperative Cardiovascular: regular rate and rhythm; no edema in BLE Pulmonary: no cough, no increased work of breathing, normal respiratory effort Abdomen: soft, non tender, no guarding, positive bowel sounds in all quadrants GU:  no suprapubic tenderness Eyes: Normal lids, no discharge ENMT: Moist mucous membranes Musculoskeletal:  weakness, ambulatory with supervision/rolling walker Skin: no rash to visible skin, warm without cyanosis,  Psych: non-anxious affect Neurological: Weakness but otherwise non focal Heme/lymph/immuno: no bruises, no bleeding  PERTINENT MEDICATIONS:  Outpatient Encounter  Medications as of 10/18/2021  Medication Sig   acetaminophen (TYLENOL) 325 MG tablet Take 325 mg by mouth every 4 (four) hours as needed for moderate pain.   alum & mag hydroxide-simeth (MAALOX PLUS) 400-400-40 MG/5ML suspension Take 30 mLs by mouth as directed. Give 30 ml once a week on Mondays & Give 30 mls by mouth every 6 hours PRN for Indigestion, Heartburn, Gas, Nausea   amLODipine (NORVASC) 5 MG tablet Take 1 tablet by mouth daily.   ascorbic acid (VITAMIN C) 500 MG tablet Take 1 tablet (500 mg total) by mouth daily.   bisacodyl (DULCOLAX) 5 MG EC tablet Take 5 mg by mouth daily as needed for moderate constipation.   calcitonin, salmon, (MIACALCIN/FORTICAL) 200 UNIT/ACT nasal spray Place 1 spray into alternate nostrils daily.   calcium carbonate (TUMS - DOSED IN MG ELEMENTAL CALCIUM) 500 MG chewable tablet Chew 1 tablet by mouth every 8 (eight) hours as needed for indigestion.   calcium-vitamin D (OSCAL WITH D) 500-200 MG-UNIT tablet Take 1 tablet by mouth 2 (two) times daily.   Carboxymethylcellulose Sodium (THERATEARS OP) Place 1 drop into both eyes daily.   ferrous sulfate 325 (65 FE) MG EC tablet Take 325 mg by mouth daily with breakfast.   melatonin 3 MG TABS tablet Take 3 mg by mouth at bedtime as needed (sleep).   Multiple Vitamin (MULTIVITAMIN WITH MINERALS) TABS tablet Take 1 tablet by mouth daily with lunch.   nitroGLYCERIN (NITROSTAT) 0.4 MG SL tablet Place 0.4 mg under the tongue every 5 (five) minutes as needed for chest pain.   Nutritional Supplements (FEEDING SUPPLEMENT, BOOST BREEZE,) LIQD Take 1 Bottle by mouth in the morning and at bedtime. For nutritional support   oxyCODONE (ROXICODONE) 5 MG immediate release tablet Take 1 tablet (5 mg total) by mouth every 4 (four) hours as needed for severe pain.   pantoprazole (PROTONIX) 40 MG tablet Take 40 mg by mouth daily.   polyethylene glycol (MIRALAX / GLYCOLAX) 17 g packet Take 17 g by mouth daily.   promethazine (PHENERGAN) 25  MG tablet Take 25 mg by mouth every 8 (eight) hours as needed for nausea.   senna-docusate (SENOKOT-S) 8.6-50 MG tablet Take 1 tablet by mouth at bedtime.   simvastatin (ZOCOR) 20 MG tablet Take 20 mg by mouth at bedtime.   tiZANidine (ZANAFLEX) 2 MG tablet Take 2 mg by mouth every 8 (eight) hours as needed for muscle spasms.   No facility-administered encounter medications on file as of 10/18/2021.    HOSPICE ELIGIBILITY/DIAGNOSIS: TBD  PAST MEDICAL HISTORY:  Past Medical History:  Diagnosis Date   Adenomatous colon polyp 2008   Anemia    Anxiety    Arthritis    knees   Blood transfusion 03/2011   Blood transfusion without reported diagnosis    Closed fracture of left distal radius    Closed fracture of right distal radius 04/17/2020   Closed intertrochanteric fracture of hip, right, initial encounter ([place]) 04/17/2020   Colon polyps    Constipation    COPD (chronic obstructive pulmonary disease) ([place])    Family history of anesthesia complication    " my son aspirated "   Femur fracture, right ([place]) 2015   GERD (gastroesophageal reflux disease)    Heartburn    High cholesterol    History of right radius fracture 04/2020   History of shingles    History of vertebral fracture    Hyperlipidemia    Hypertension    Insomnia    Left Radius fracture 04/2014  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   Recurrent falls 03/27/2021   Resides in long term care facility 03/27/2021   Rosacea       ALLERGIES:  Allergies  Allergen Reactions   Morphine And Related Nausea Only      I spent 45 minutes providing this consultation; this includes time spent with patient/family, chart review and documentation. More than 50% of the time in this consultation was spent on counseling and coordinating communication   Thank you for the opportunity to participate in the care of Savannah Coleman Please call our office at [phone] if we  can be of additional assistance.  Note: Portions of this note were generated with [employer]. Dictation errors may occur despite best attempts at proofreading.  Teodoro Spray, NP

## 2021-12-01 ENCOUNTER — Encounter (HOSPITAL_COMMUNITY): Payer: Self-pay

## 2021-12-01 ENCOUNTER — Emergency Department (HOSPITAL_COMMUNITY): Payer: Medicare Other

## 2021-12-01 ENCOUNTER — Other Ambulatory Visit: Payer: Self-pay

## 2021-12-01 ENCOUNTER — Observation Stay (HOSPITAL_COMMUNITY): Payer: Medicare Other

## 2021-12-01 ENCOUNTER — Inpatient Hospital Stay (HOSPITAL_COMMUNITY)
Admission: EM | Admit: 2021-12-01 | Discharge: 2021-12-18 | DRG: 092 | Disposition: E | Payer: Medicare Other | Attending: Internal Medicine | Admitting: Internal Medicine

## 2021-12-01 DIAGNOSIS — Z79899 Other long term (current) drug therapy: Secondary | ICD-10-CM

## 2021-12-01 DIAGNOSIS — Z515 Encounter for palliative care: Secondary | ICD-10-CM

## 2021-12-01 DIAGNOSIS — K219 Gastro-esophageal reflux disease without esophagitis: Secondary | ICD-10-CM | POA: Diagnosis not present

## 2021-12-01 DIAGNOSIS — I10 Essential (primary) hypertension: Secondary | ICD-10-CM

## 2021-12-01 DIAGNOSIS — R778 Other specified abnormalities of plasma proteins: Secondary | ICD-10-CM | POA: Diagnosis not present

## 2021-12-01 DIAGNOSIS — E7800 Pure hypercholesterolemia, unspecified: Secondary | ICD-10-CM | POA: Diagnosis present

## 2021-12-01 DIAGNOSIS — R4182 Altered mental status, unspecified: Secondary | ICD-10-CM | POA: Diagnosis present

## 2021-12-01 DIAGNOSIS — R54 Age-related physical debility: Secondary | ICD-10-CM | POA: Diagnosis present

## 2021-12-01 DIAGNOSIS — G9340 Encephalopathy, unspecified: Secondary | ICD-10-CM | POA: Diagnosis not present

## 2021-12-01 DIAGNOSIS — R627 Adult failure to thrive: Secondary | ICD-10-CM | POA: Diagnosis present

## 2021-12-01 DIAGNOSIS — Z96612 Presence of left artificial shoulder joint: Secondary | ICD-10-CM | POA: Diagnosis present

## 2021-12-01 DIAGNOSIS — G928 Other toxic encephalopathy: Secondary | ICD-10-CM | POA: Diagnosis not present

## 2021-12-01 DIAGNOSIS — Z66 Do not resuscitate: Secondary | ICD-10-CM | POA: Diagnosis present

## 2021-12-01 DIAGNOSIS — Z9181 History of falling: Secondary | ICD-10-CM

## 2021-12-01 DIAGNOSIS — Z681 Body mass index (BMI) 19 or less, adult: Secondary | ICD-10-CM

## 2021-12-01 DIAGNOSIS — Z96653 Presence of artificial knee joint, bilateral: Secondary | ICD-10-CM | POA: Diagnosis present

## 2021-12-01 DIAGNOSIS — Z981 Arthrodesis status: Secondary | ICD-10-CM

## 2021-12-01 DIAGNOSIS — R410 Disorientation, unspecified: Secondary | ICD-10-CM

## 2021-12-01 DIAGNOSIS — J449 Chronic obstructive pulmonary disease, unspecified: Secondary | ICD-10-CM | POA: Diagnosis present

## 2021-12-01 DIAGNOSIS — G8929 Other chronic pain: Secondary | ICD-10-CM

## 2021-12-01 DIAGNOSIS — G894 Chronic pain syndrome: Secondary | ICD-10-CM | POA: Diagnosis present

## 2021-12-01 DIAGNOSIS — R64 Cachexia: Secondary | ICD-10-CM | POA: Diagnosis present

## 2021-12-01 DIAGNOSIS — R296 Repeated falls: Secondary | ICD-10-CM | POA: Diagnosis present

## 2021-12-01 DIAGNOSIS — F39 Unspecified mood [affective] disorder: Secondary | ICD-10-CM | POA: Diagnosis present

## 2021-12-01 DIAGNOSIS — Z20822 Contact with and (suspected) exposure to covid-19: Secondary | ICD-10-CM | POA: Diagnosis present

## 2021-12-01 DIAGNOSIS — E785 Hyperlipidemia, unspecified: Secondary | ICD-10-CM | POA: Diagnosis present

## 2021-12-01 DIAGNOSIS — Z825 Family history of asthma and other chronic lower respiratory diseases: Secondary | ICD-10-CM

## 2021-12-01 LAB — CBC WITH DIFFERENTIAL/PLATELET
Abs Immature Granulocytes: 0.04 10*3/uL (ref 0.00–0.07)
Basophils Absolute: 0.1 10*3/uL (ref 0.0–0.1)
Basophils Relative: 1 %
Eosinophils Absolute: 0.1 10*3/uL (ref 0.0–0.5)
Eosinophils Relative: 2 %
HCT: 46.5 % — ABNORMAL HIGH (ref 36.0–46.0)
Hemoglobin: 15.6 g/dL — ABNORMAL HIGH (ref 12.0–15.0)
Immature Granulocytes: 0 %
Lymphocytes Relative: 21 %
Lymphs Abs: 2 10*3/uL (ref 0.7–4.0)
MCH: 32.2 pg (ref 26.0–34.0)
MCHC: 33.5 g/dL (ref 30.0–36.0)
MCV: 96.1 fL (ref 80.0–100.0)
Monocytes Absolute: 0.9 10*3/uL (ref 0.1–1.0)
Monocytes Relative: 9 %
Neutro Abs: 6.4 10*3/uL (ref 1.7–7.7)
Neutrophils Relative %: 67 %
Platelets: 312 10*3/uL (ref 150–400)
RBC: 4.84 MIL/uL (ref 3.87–5.11)
RDW: 12.5 % (ref 11.5–15.5)
WBC: 9.5 10*3/uL (ref 4.0–10.5)
nRBC: 0 % (ref 0.0–0.2)

## 2021-12-01 LAB — COMPREHENSIVE METABOLIC PANEL
ALT: 19 U/L (ref 0–44)
AST: 32 U/L (ref 15–41)
Albumin: 3.7 g/dL (ref 3.5–5.0)
Alkaline Phosphatase: 121 U/L (ref 38–126)
Anion gap: 13 (ref 5–15)
BUN: 15 mg/dL (ref 8–23)
CO2: 25 mmol/L (ref 22–32)
Calcium: 10 mg/dL (ref 8.9–10.3)
Chloride: 101 mmol/L (ref 98–111)
Creatinine, Ser: 0.95 mg/dL (ref 0.44–1.00)
GFR, Estimated: 58 mL/min — ABNORMAL LOW (ref >60)
Glucose, Bld: 122 mg/dL — ABNORMAL HIGH (ref 70–99)
Potassium: 3.5 mmol/L (ref 3.5–5.1)
Sodium: 139 mmol/L (ref 135–145)
Total Bilirubin: 1.3 mg/dL — ABNORMAL HIGH (ref 0.3–1.2)
Total Protein: 7 g/dL (ref 6.5–8.1)

## 2021-12-01 LAB — RAPID URINE DRUG SCREEN, HOSP PERFORMED
Amphetamines: NOT DETECTED
Barbiturates: NOT DETECTED
Benzodiazepines: NOT DETECTED
Cocaine: NOT DETECTED
Opiates: POSITIVE — AB
Tetrahydrocannabinol: NOT DETECTED

## 2021-12-01 LAB — ETHANOL: Alcohol, Ethyl (B): <10 mg/dL (ref <10)

## 2021-12-01 LAB — URINALYSIS, ROUTINE W REFLEX MICROSCOPIC
Bilirubin Urine: NEGATIVE
Glucose, UA: NEGATIVE mg/dL
Ketones, ur: 40 mg/dL — AB
Nitrite: NEGATIVE
Protein, ur: NEGATIVE mg/dL
Specific Gravity, Urine: 1.02 (ref 1.005–1.030)
pH: 7 (ref 5.0–8.0)

## 2021-12-01 LAB — TROPONIN I (HIGH SENSITIVITY)
Troponin I (High Sensitivity): 30 ng/L — ABNORMAL HIGH (ref <18)
Troponin I (High Sensitivity): 64 ng/L — ABNORMAL HIGH (ref <18)

## 2021-12-01 LAB — URINALYSIS, MICROSCOPIC (REFLEX): Bacteria, UA: NONE SEEN

## 2021-12-01 LAB — RESP PANEL BY RT-PCR (FLU A&B, COVID) ARPGX2
Influenza A by PCR: NEGATIVE
Influenza B by PCR: NEGATIVE
SARS Coronavirus 2 by RT PCR: NEGATIVE

## 2021-12-01 MED ORDER — SODIUM CHLORIDE 0.9 % IV SOLN: INTRAVENOUS | Status: DC

## 2021-12-01 MED ORDER — LORAZEPAM 2 MG/ML IJ SOLN
0.5000 mg | Freq: Once | INTRAMUSCULAR | Status: AC
  Administered 2021-12-01: 0.5 mg via INTRAVENOUS
  Filled 2021-12-01: qty 1

## 2021-12-01 MED ORDER — ACETAMINOPHEN 325 MG PO TABS
650.0000 mg | ORAL_TABLET | Freq: Four times a day (QID) | ORAL | Status: DC | PRN
  Administered 2021-12-01: 650 mg via ORAL
  Filled 2021-12-01: qty 2

## 2021-12-01 MED ORDER — LABETALOL HCL 5 MG/ML IV SOLN
10.0000 mg | Freq: Once | INTRAVENOUS | Status: AC
  Administered 2021-12-01: 10 mg via INTRAVENOUS
  Filled 2021-12-01: qty 4

## 2021-12-01 MED ORDER — ENOXAPARIN SODIUM 30 MG/0.3ML IJ SOSY: 30.0000 mg | PREFILLED_SYRINGE | INTRAMUSCULAR | Status: DC

## 2021-12-01 MED ORDER — SODIUM CHLORIDE 0.9 % IV BOLUS
1000.0000 mL | Freq: Once | INTRAVENOUS | Status: AC
  Administered 2021-12-01: 1000 mL via INTRAVENOUS

## 2021-12-01 MED ORDER — MORPHINE SULFATE (PF) 2 MG/ML IV SOLN
1.0000 mg | Freq: Once | INTRAVENOUS | Status: AC
  Administered 2021-12-01: 1 mg via INTRAVENOUS
  Filled 2021-12-01: qty 1

## 2021-12-01 NOTE — ED Notes (Signed)
Daughter reports patient continues to hold chest and c/o pain.  Medicated per order.  Verified allergy with daughter.  Daughter states that "it actually does really good for her.  We thought Morphine was an allergy, but it is not.  It was when she had sepsis that she had the delusions."

## 2021-12-01 NOTE — ED Notes (Signed)
Patient taken to MRI at this time 

## 2021-12-01 NOTE — ED Notes (Signed)
Patient returned from MRI.  Unable to have scan completed due to motion.  Daughter at bedside does not want to attempt scan again.  Would like to speak to MD about comfort care and to keep patient as comfortable as possible.  MD made aware.

## 2021-12-01 NOTE — ED Provider Notes (Addendum)
[facility] EMERGENCY DEPARTMENT Provider Note   CSN: [id number] Arrival date & time: 11/30/2021  1633     History Chief Complaint  Patient presents with   Altered Mental Status    Savannah Coleman is a 87 y.o. female. Level 5 caveat due to altered mental status.  Altered Mental Status Patient presents with altered mental status.  Has been more confused for last few days.  Decreased oral intake.  Does not recognize her daughter which is unusual for her.  Patient has had a couple falls over the last month.  Reportedly hit her head.  Patient cannot really provide much history at this time.    Past Medical History:  Diagnosis Date   Adenomatous colon polyp 2008   Anemia    Anxiety    Arthritis    knees   Blood transfusion 03/2011   Blood transfusion without reported diagnosis    Closed fracture of left distal radius    Closed fracture of right distal radius 04/17/2020   Closed intertrochanteric fracture of hip, right, initial encounter ([place]) 04/17/2020   Colon polyps    Constipation    COPD (chronic obstructive pulmonary disease) ([place])    Family history of anesthesia complication    " my son aspirated "   Femur fracture, right ([place]) 2015   GERD (gastroesophageal reflux disease)    Heartburn    High cholesterol    History of right radius fracture 04/2020   History of shingles    History of vertebral fracture    Hyperlipidemia    Hypertension    Insomnia    Left Radius fracture 04/2014   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   Recurrent falls 03/27/2021   Resides in long term care facility 03/27/2021   Rosacea     Patient Active Problem List   Diagnosis Date Noted   E. coli UTI    Hypophosphatemia    Nephrolithiasis 07/20/2021   GERD (gastroesophageal reflux disease) 07/20/2021   Acute cystitis without hematuria    Sepsis ([place]) 07/19/2021   Postoperative anemia due to acute blood loss  03/29/2021   Multiple fractures 03/27/2021   Hypokalemia 03/27/2021   Recurrent falls 03/27/2021   Closed fracture of neck of left femur ([place]) 03/27/2021   Closed fracture of left distal radius and ulna, initial encounter 03/27/2021   Left humeral neck fracture 03/27/2021   Essential hypertension 03/27/2021   Hyperlipidemia 03/27/2021   Resides in long term care facility 03/27/2021   Osteoporosis 03/27/2021   Closed fracture of anatomical neck of left humerus    Fall at nursing home    Closed fracture of left distal radius    Intertrochanteric fracture of right hip ([place]) 04/17/2020   Closed fracture of right distal radius 04/17/2020   Unspecified constipation 05/05/2014   Postoperative anemia due to acute blood loss 05/02/2014   Femur fracture, right ([place]) 04/30/2014   Hypertension    Hyperlipidemia    Adenomatous colon polyp    History of shingles    Rosacea    Positive PPD    Blood transfusion 03/19/2011    Past Surgical History:  Procedure Laterality Date   [place]   COLONOSCOPY     CYSTOSCOPY WITH URETEROSCOPY AND STENT PLACEMENT Right 07/19/2021   Procedure: CYSTOSCOPY WITH URETEROSCOPY AND STENT PLACEMENT;  Surgeon: Marton Redwood III,  MD;  Location: [facility];  Service: Urology;  Laterality: Right;   CYSTOSCOPY/URETEROSCOPY/HOLMIUM LASER/STENT PLACEMENT Bilateral 08/05/2021   Procedure: CYSTOSCOPY BILATERAL URETEROSCOPY/HOLMIUM LASER/STENT PLACEMENT/BILATERAL RETROGRADE PYELOGRAM;  Surgeon: Lucas Mallow, MD;  Location: [facility];  Service: Urology;  Laterality: Bilateral;   FEMUR IM NAIL Right 05/01/2014   Procedure: INTRAMEDULLARY (IM) RETROGRADE FEMORAL NAILING;  Surgeon: Johnny Bridge, MD;  Location: [facility];  Service: Orthopedics;  Laterality: Right;   FEMUR IM NAIL Right 04/19/2020   Procedure: REMOVAL OF BIOMET RETROGRADE NAIL;  Surgeon: Altamese Arden, MD;  Location: [facility];  Service: Orthopedics;  Laterality: Right;   FEMUR IM NAIL  Right 04/19/2020   Procedure: INSERTION OF ANTEGRADE INTRAMEDULLARY (IM) NAIL FEMORAL;  Surgeon: Altamese [place], MD;  Location: [facility];  Service: Orthopedics;  Laterality: Right;   FRACTURE SURGERY     HIP ARTHROPLASTY Right    INTRAMEDULLARY (IM) NAIL INTERTROCHANTERIC Left 03/27/2021   Procedure: INTRAMEDULLARY (IM) NAIL INTERTROCHANTRIC;  Surgeon: Shona Needles, MD;  Location: [facility];  Service: Orthopedics;  Laterality: Left;   JOINT REPLACEMENT     LUMBAR FUSION     ORIF WRIST FRACTURE Right 04/19/2020   Procedure: Open Reduction Internal Fixation (Orif) Wrist Fracture;  Surgeon: Altamese [place], MD;  Location: [facility];  Service: Orthopedics;  Laterality: Right;   ORIF WRIST FRACTURE Left 03/27/2021   Procedure: OPEN REDUCTION INTERNAL FIXATION (ORIF) WRIST FRACTURE;  Surgeon: Shona Needles, MD;  Location: [facility];  Service: Orthopedics;  Laterality: Left;   POLYPECTOMY     REPLACEMENT TOTAL KNEE BILATERAL  2011 & 2012   REVERSE SHOULDER ARTHROPLASTY Left 03/30/2021   Procedure: REVERSE SHOULDER ARTHROPLASTY;  Surgeon: Nicholes Stairs, MD;  Location: [facility];  Service: Orthopedics;  Laterality: Left;   TONSILLECTOMY     VERTEBROPLASTY     Lumbar vertebra   WRIST SURGERY Bilateral      OB History   No obstetric history on file.     Family History  Problem Relation Age of Onset   COPD Father    Asthma Father    Asthma Sister    Colon cancer Neg Hx    Stomach cancer Neg Hx     Social History   Tobacco Use   Smoking status: Never   Smokeless tobacco: Never   Tobacco comments:    quit smoking in the early 80's  Vaping Use   Vaping Use: Never used  Substance Use Topics   Alcohol use: Not Currently   Drug use: Not Currently    Home Medications Prior to Admission medications   Medication Sig Start Date End Date Taking? Authorizing Provider  ascorbic acid (VITAMIN C) 500 MG tablet Take 1 tablet (500 mg total) by mouth daily. 05/01/20  Yes Alma Friendly, MD   calcitonin, salmon, (MIACALCIN/FORTICAL) 200 UNIT/ACT nasal spray Place 1 spray into alternate nostrils daily.   Yes [provider]  calcium carbonate (TUMS - DOSED IN MG ELEMENTAL CALCIUM) 500 MG chewable tablet Chew 1 tablet by mouth 2 (two) times daily.   Yes [provider]  Carboxymethylcellulose Sodium (THERATEARS OP) Place 1 drop into both eyes daily.   Yes [provider]  ferrous sulfate 325 (65 FE) MG EC tablet Take 325 mg by mouth daily with breakfast.   Yes [provider]  HYDROcodone-acetaminophen (NORCO/VICODIN) 5-325 MG tablet Take 1 tablet by mouth every 6 (six) hours as needed for moderate pain.   Yes [provider]  melatonin 3 MG  TABS tablet Take 3 mg by mouth daily as needed (sleep).   Yes [provider]  Multiple Vitamin (MULTIVITAMIN WITH MINERALS) TABS tablet Take 1 tablet by mouth daily.   Yes [provider]  Nutritional Supplements (FEEDING SUPPLEMENT, BOOST BREEZE,) LIQD Take 1 Bottle by mouth in the morning and at bedtime. For nutritional support   Yes [provider]  pantoprazole (PROTONIX) 40 MG tablet Take 40 mg by mouth daily. 03/18/20  Yes [provider]  senna-docusate (SENOKOT-S) 8.6-50 MG tablet Take 1 tablet by mouth 2 (two) times daily.   Yes [provider]  sertraline (ZOLOFT) 50 MG tablet Take 50 mg by mouth daily.   Yes [provider]  simvastatin (ZOCOR) 20 MG tablet Take 20 mg by mouth at bedtime.   Yes [provider]  tamsulosin (FLOMAX) 0.4 MG CAPS capsule Take 0.4 mg by mouth at bedtime.   Yes [provider]  tiZANidine (ZANAFLEX) 2 MG tablet Take 2 mg by mouth every 8 (eight) hours as needed for muscle spasms. 02/19/21  Yes [provider]  oxyCODONE (ROXICODONE) 5 MG immediate release tablet Take 1 tablet (5 mg total) by mouth every 4 (four) hours as needed for severe pain. Patient not taking: Reported on 11/21/2021 07/22/21    Eugenie Filler, MD  polyethylene glycol (MIRALAX / GLYCOLAX) 17 g packet Take 17 g by mouth daily. Patient not taking: Reported on 11/17/2021 05/02/20   Alma Friendly, MD    Allergies    Morphine and related  Review of Systems   Review of Systems  Physical Exam Updated Vital Signs BP (!) 198/105    Pulse 77    Temp 99.2 F (37.3 C) (Rectal)    Resp (!) 22    Ht 5\' 3"  (1.6 m)    Wt 45.4 kg    SpO2 92%    BMI 17.71 kg/m   Physical Exam  ED Results / Procedures / Treatments   Labs (all labs ordered are listed, but only abnormal results are displayed) Labs Reviewed  CBC WITH DIFFERENTIAL/PLATELET - Abnormal; Notable for the following components:      Result Value   Hemoglobin 15.6 (*)    HCT 46.5 (*)    All other components within normal limits  COMPREHENSIVE METABOLIC PANEL - Abnormal; Notable for the following components:   Glucose, Bld 122 (*)    Total Bilirubin 1.3 (*)    GFR, Estimated 58 (*)    All other components within normal limits  URINALYSIS, ROUTINE W REFLEX MICROSCOPIC - Abnormal; Notable for the following components:   Hgb urine dipstick SMALL (*)    Ketones, ur 40 (*)    Leukocytes,Ua TRACE (*)    All other components within normal limits  RAPID URINE DRUG SCREEN, HOSP PERFORMED - Abnormal; Notable for the following components:   Opiates POSITIVE (*)    All other components within normal limits  TROPONIN I (HIGH SENSITIVITY) - Abnormal; Notable for the following components:   Troponin I (High Sensitivity) 30 (*)    All other components within normal limits  RESP PANEL BY RT-PCR (FLU A&B, COVID) ARPGX2  ETHANOL  URINALYSIS, MICROSCOPIC (REFLEX)  TROPONIN I (HIGH SENSITIVITY)    EKG EKG Interpretation  Date/Time:  Thursday December 01 2021 16:44:10 EST Ventricular Rate:  87 PR Interval:  226 QRS Duration: 111 QT Interval:  400 QTC Calculation: 482 R Axis:   -61 Text Interpretation: Sinus rhythm Prolonged PR interval LVH with IVCD, LAD  and secondary repol abnrm Confirmed by Davonna Belling [phone]) on 11/29/2021 4:47:20 PM  Radiology CT HEAD WO CONTRAST (5MM)  Result Date: 11/19/2021 CLINICAL DATA:  Seizure, new-onset, history of trauma. Altered mental status EXAM: CT HEAD WITHOUT CONTRAST TECHNIQUE: Contiguous axial images were obtained from the base of the skull through the vertex without intravenous contrast. COMPARISON:  03/27/2021 FINDINGS: Brain: There is atrophy and chronic small vessel disease changes. No acute intracranial abnormality. Specifically, no hemorrhage, hydrocephalus, mass lesion, acute infarction, or significant intracranial injury. Vascular: No hyperdense vessel or unexpected calcification. Skull: No acute calvarial abnormality. Sinuses/Orbits: No acute findings Other: None IMPRESSION: Atrophy, chronic microvascular disease. No acute intracranial abnormality. Electronically Signed   By: Rolm Baptise M.D.   On: 12/17/2021 17:50   CT Cervical Spine Wo Contrast  Result Date: 11/23/2021 CLINICAL DATA:  Neck trauma (Age >= 65y).  Possible seizure EXAM: CT CERVICAL SPINE WITHOUT CONTRAST TECHNIQUE: Multidetector CT imaging of the cervical spine was performed without intravenous contrast. Multiplanar CT image reconstructions were also generated. COMPARISON:  03/27/2021 FINDINGS: Alignment: Normal. Skull base and vertebrae: No acute fracture. No primary bone lesion or focal pathologic process. Soft tissues and spinal canal: No prevertebral fluid or swelling. No visible canal hematoma. Disc levels: Disc space narrowing and early spurring in the mid to lower cervical spine. Mild bilateral multilevel degenerative facet disease. Upper chest: Biapical scarring.  No acute findings Other: None IMPRESSION: Degenerative changes.  No acute bony abnormality. Electronically Signed   By: Rolm Baptise M.D.   On: 11/29/2021 17:53   DG Chest Portable 1 View  Result Date: 11/26/2021 CLINICAL DATA:  Altered level of consciousness,  possible seizure, multiple falls EXAM: PORTABLE CHEST 1 VIEW COMPARISON:  07/19/2021 FINDINGS: Single frontal view of the chest demonstrates a stable cardiac silhouette. Continued ectasia of the thoracic aorta. Chronic interstitial scarring again noted, without acute airspace disease, effusion, or pneumothorax. Subacute right posterior fourth through sixth rib fractures with abundant callus formation, new since prior study. Unremarkable left shoulder arthroplasty. IMPRESSION: 1. Subacute healing right posterior fourth through sixth rib fractures. 2. Chronic interstitial lung disease.  No acute process. Electronically Signed   By: Randa Ngo M.D.   On: 12/13/2021 17:25    Procedures Procedures   Medications Ordered in ED Medications  sodium chloride 0.9 % bolus 1,000 mL (1,000 mLs Intravenous New Bag/Given 11/20/2021 1707)    ED Course  I have reviewed the triage vital signs and the nursing notes.  Pertinent labs & imaging results that were available during my care of the patient were reviewed by me and considered in my medical decision making (see chart for details).    MDM Rules/Calculators/A&P                         Patient mental status change.  Confusion.  Has had previous UTIs.  Urine however does not show infection.  Likely has some dehydration since she has ketones in her urine has not been eating and drinking as much.  Chest x-ray shows no pneumonia but showed some healing 4 through 6 rib fractures.  Hemoglobin is also somewhat elevated.  Still confused.  Head CT reassuring.  Also hypertensive.  Unsure if this is also some of the cause.   There was questionable seizure activity witnessed at her nursing home.  No activity here.  No history of seizures   however with persistent mental status change will admit to hospitalist    Final Clinical  Impression(s) / ED Diagnoses Final diagnoses:  Encephalopathy  Hypertension, unspecified type    Rx / DC Orders ED Discharge Orders      None        Davonna Belling, MD 11/20/2021 Paulette Blanch    Davonna Belling, MD 12/09/2021 1907

## 2021-12-01 NOTE — ED Triage Notes (Signed)
EMS Called to facility for altered mental status, with possible seizure activity, EMS states that daughter told them she has had 2 falls in past four weeks hitting her head both times, there has been facial droop since falls.

## 2021-12-01 NOTE — H&P (Signed)
History and Physical    Savannah Coleman [id number] DOB: [birth year] DOA: 11/17/2021  PCP: Pcp, No  Patient coming from: SNF [facility]  I have personally briefly reviewed patient's old medical records in [place]  Chief Complaint: AMS  HPI: Savannah Coleman is a 87 y.o. female with medical history significant for hypertension, COPD, hyperlipidemia and GERD who presents with altered mental status.  Daughter at bedside provides history since patient answering with incoherent words.  She personally has not seen the patient for the past week.  However was told by staff today that patient was laying in bed and was more confused.  She was staring off to one side and not responding.  Staff reports some jerking motion of her body but not her extremities.  She has also had decreased appetite and weight loss for months.  daughter feels that she has just been declining.  Had 2 falls in the past month due to weakness. she had pneumonia early November.  Back in August was also admitted for urosepsis due to obstructing stone. Normally at baseline patient is alert and oriented to self, family member and always likes to read.  Daughter feels that she has progressively worsened since she has been in the ED.  States before she was still having a conversation with her and another daughter over the phone.  Now she is unable to recognize them.   ED Course: She was afebrile with temperature 99.2 F, hypertensive up to 185/102.  No leukocytosis but appears hemoconcentrated with hemoglobin of 15.6.  No electrolyte abnormalities.  Creatinine is normal at 0.95.  BG of 122.  UA just shows trace leukocyte and negative nitrite.  UDS is positive for opioids. Troponin mildly elevated at 30.  Chest x-ray showing subacute fracture of the fourth through sixth rib. CT head scan is negative for acute findings  Review of Systems: Unable to obtain due to AMS  Past Medical History:  Diagnosis Date   Adenomatous  colon polyp 2008   Anemia    Anxiety    Arthritis    knees   Blood transfusion 03/2011   Blood transfusion without reported diagnosis    Closed fracture of left distal radius    Closed fracture of right distal radius 04/17/2020   Closed intertrochanteric fracture of hip, right, initial encounter ([place]) 04/17/2020   Colon polyps    Constipation    COPD (chronic obstructive pulmonary disease) ([place])    Family history of anesthesia complication    " my son aspirated "   Femur fracture, right ([place]) 2015   GERD (gastroesophageal reflux disease)    Heartburn    High cholesterol    History of right radius fracture 04/2020   History of shingles    History of vertebral fracture    Hyperlipidemia    Hypertension    Insomnia    Left Radius fracture 04/2014   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   Recurrent falls 03/27/2021   Resides in long term care facility 03/27/2021   Rosacea     Past Surgical History:  Procedure Laterality Date   [place]   COLONOSCOPY     CYSTOSCOPY WITH URETEROSCOPY AND STENT PLACEMENT Right 07/19/2021   Procedure: CYSTOSCOPY WITH URETEROSCOPY AND STENT PLACEMENT;  Surgeon: Lucas Mallow, MD;  Location: [facility];  Service: Urology;  Laterality:  Right;   CYSTOSCOPY/URETEROSCOPY/HOLMIUM LASER/STENT PLACEMENT Bilateral 08/05/2021   Procedure: CYSTOSCOPY BILATERAL URETEROSCOPY/HOLMIUM LASER/STENT PLACEMENT/BILATERAL RETROGRADE PYELOGRAM;  Surgeon: Lucas Mallow, MD;  Location: [facility];  Service: Urology;  Laterality: Bilateral;   FEMUR IM NAIL Right 05/01/2014   Procedure: INTRAMEDULLARY (IM) RETROGRADE FEMORAL NAILING;  Surgeon: Johnny Bridge, MD;  Location: [facility];  Service: Orthopedics;  Laterality: Right;   FEMUR IM NAIL Right 04/19/2020   Procedure: REMOVAL OF BIOMET RETROGRADE NAIL;  Surgeon: Altamese Eliz[place]own, MD;  Location: [facility];  Service: Orthopedics;  Laterality:  Right;   FEMUR IM NAIL Right 04/19/2020   Procedure: INSERTION OF ANTEGRADE INTRAMEDULLARY (IM) NAIL FEMORAL;  Surgeon: Altamese [place], MD;  Location: [facility];  Service: Orthopedics;  Laterality: Right;   FRACTURE SURGERY     HIP ARTHROPLASTY Right    INTRAMEDULLARY (IM) NAIL INTERTROCHANTERIC Left 03/27/2021   Procedure: INTRAMEDULLARY (IM) NAIL INTERTROCHANTRIC;  Surgeon: Shona Needles, MD;  Location: [facility];  Service: Orthopedics;  Laterality: Left;   JOINT REPLACEMENT     LUMBAR FUSION     ORIF WRIST FRACTURE Right 04/19/2020   Procedure: Open Reduction Internal Fixation (Orif) Wrist Fracture;  Surgeon: Altamese [place], MD;  Location: [place] [facility];  Service: Orthopedics;  Laterality: Right;   ORIF WRIST FRACTURE Left 03/27/2021   Procedure: OPEN REDUCTION INTERNAL FIXATION (ORIF) WRIST FRACTURE;  Surgeon: Shona Needles, MD;  Location: [facility];  Service: Orthopedics;  Laterality: Left;   POLYPECTOMY     REPLACEMENT TOTAL KNEE BILATERAL  2011 & 2012   REVERSE SHOULDER ARTHROPLASTY Left 03/30/2021   Procedure: REVERSE SHOULDER ARTHROPLASTY;  Surgeon: Nicholes Stairs, MD;  Location: [facility];  Service: Orthopedics;  Laterality: Left;   TONSILLECTOMY     VERTEBROPLASTY     Lumbar vertebra   WRIST SURGERY Bilateral      reports that she has never smoked. She has never used smokeless tobacco. She reports that she does not currently use alcohol. She reports that she does not currently use drugs. Social History  Allergies  Allergen Reactions   Morphine And Related Nausea And Vomiting    Delusions    Family History  Problem Relation Age of Onset   COPD Father    Asthma Father    Asthma Sister    Colon cancer Neg Hx    Stomach cancer Neg Hx      Prior to Admission medications   Medication Sig Start Date End Date Taking? Authorizing Provider  ascorbic acid (VITAMIN C) 500 MG tablet Take 1 tablet (500 mg total) by mouth daily. 05/01/20  Yes Alma Friendly, MD  calcitonin, salmon,  (MIACALCIN/FORTICAL) 200 UNIT/ACT nasal spray Place 1 spray into alternate nostrils daily.   Yes [provider]  calcium carbonate (TUMS - DOSED IN MG ELEMENTAL CALCIUM) 500 MG chewable tablet Chew 1 tablet by mouth 2 (two) times daily.   Yes [provider]  Carboxymethylcellulose Sodium (THERATEARS OP) Place 1 drop into both eyes daily.   Yes [provider]  ferrous sulfate 325 (65 FE) MG EC tablet Take 325 mg by mouth daily with breakfast.   Yes [provider]  HYDROcodone-acetaminophen (NORCO/VICODIN) 5-325 MG tablet Take 1 tablet by mouth every 6 (six) hours as needed for moderate pain.   Yes [provider]  melatonin 3 MG TABS tablet Take 3 mg by mouth daily as needed (sleep).   Yes [provider]  Multiple Vitamin (MULTIVITAMIN WITH MINERALS) TABS tablet Take 1 tablet by  mouth daily.   Yes [provider]  Nutritional Supplements (FEEDING SUPPLEMENT, BOOST BREEZE,) LIQD Take 1 Bottle by mouth in the morning and at bedtime. For nutritional support   Yes [provider]  pantoprazole (PROTONIX) 40 MG tablet Take 40 mg by mouth daily. 03/18/20  Yes [provider]  senna-docusate (SENOKOT-S) 8.6-50 MG tablet Take 1 tablet by mouth 2 (two) times daily.   Yes [provider]  sertraline (ZOLOFT) 50 MG tablet Take 50 mg by mouth daily.   Yes [provider]  simvastatin (ZOCOR) 20 MG tablet Take 20 mg by mouth at bedtime.   Yes [provider]  tamsulosin (FLOMAX) 0.4 MG CAPS capsule Take 0.4 mg by mouth at bedtime.   Yes [provider]  tiZANidine (ZANAFLEX) 2 MG tablet Take 2 mg by mouth every 8 (eight) hours as needed for muscle spasms. 02/19/21  Yes [provider]  oxyCODONE (ROXICODONE) 5 MG immediate release tablet Take 1 tablet (5 mg total) by mouth every 4 (four) hours as needed for severe pain. Patient not taking: Reported on 11/18/2021 07/22/21   Eugenie Filler,  MD  polyethylene glycol (MIRALAX / GLYCOLAX) 17 g packet Take 17 g by mouth daily. Patient not taking: Reported on 12/17/2021 05/02/20   Alma Friendly, MD    Physical Exam: Vitals:   11/30/2021 1900 11/24/2021 1930 11/30/2021 2000 12/05/2021 2030  BP: (!) 185/102 (!) 167/94 (!) 181/95 (!) 174/96  Pulse: 77 64 65 67  Resp: (!) 21 15 12 16   Temp:      TempSrc:      SpO2: 92% 93% 94% 94%  Weight:      Height:        Constitutional: Elderly nontoxic-appearing female laying flat in bed Vitals:   11/28/2021 1900 11/18/2021 1930 12/10/2021 2000 11/29/2021 2030  BP: (!) 185/102 (!) 167/94 (!) 181/95 (!) 174/96  Pulse: 77 64 65 67  Resp: (!) 21 15 12 16   Temp:      TempSrc:      SpO2: 92% 93% 94% 94%  Weight:      Height:       Eyes: lids and conjunctivae normal ENMT: Mucous membranes are moist.  Neck: normal, supple Respiratory: clear to auscultation bilaterally, no wheezing, no crackles. Normal respiratory effort. No accessory muscle use.  Cardiovascular: Regular rate and rhythm, no murmurs / rubs / gallops. No extremity edema. Abdomen: no tenderness, no masses palpated.  Bowel sounds positive.  Musculoskeletal: no clubbing / cyanosis. No joint deformity upper and lower extremities.  Skin: no rashes, lesions, ulcers.  Neurologic: Alert but disoriented.  She is unable to share with me her name, name of her daughter at bedside.  Answering with incoherent words.  She was able to follow commands with hand grip although weaker to the left hand.  Also able to lift her lower extremities on command. Psychiatric: Disoriented.    Labs on Admission: I have personally reviewed following labs and imaging studies  CBC: Recent Labs  Lab 12/17/2021 1658  WBC 9.5  NEUTROABS 6.4  HGB 15.6*  HCT 46.5*  MCV 96.1  PLT 195   Basic Metabolic Panel: Recent Labs  Lab 11/23/2021 1658  NA 139  K 3.5  CL 101  CO2 25  GLUCOSE 122*  BUN 15  CREATININE 0.95  CALCIUM 10.0   GFR: Estimated Creatinine  Clearance: 29.3 mL/min (by C-G formula based on SCr of 0.95 mg/dL). Liver Function Tests: Recent Labs  Lab  11/30/2021 1658  AST 32  ALT 19  ALKPHOS 121  BILITOT 1.3*  PROT 7.0  ALBUMIN 3.7   No results for input(s): LIPASE, AMYLASE in the last 168 hours. No results for input(s): AMMONIA in the last 168 hours. Coagulation Profile: No results for input(s): INR, PROTIME in the last 168 hours. Cardiac Enzymes: No results for input(s): CKTOTAL, CKMB, CKMBINDEX, TROPONINI in the last 168 hours. BNP (last 3 results) No results for input(s): PROBNP in the last 8760 hours. HbA1C: No results for input(s): HGBA1C in the last 72 hours. CBG: No results for input(s): GLUCAP in the last 168 hours. Lipid Profile: No results for input(s): CHOL, HDL, LDLCALC, TRIG, CHOLHDL, LDLDIRECT in the last 72 hours. Thyroid Function Tests: No results for input(s): TSH, T4TOTAL, FREET4, T3FREE, THYROIDAB in the last 72 hours. Anemia Panel: No results for input(s): VITAMINB12, FOLATE, FERRITIN, TIBC, IRON, RETICCTPCT in the last 72 hours. Urine analysis:    Component Value Date/Time   COLORURINE YELLOW 12/17/2021 [place] 12/13/2021 1659   LABSPEC 1.020 11/30/2021 1659   PHURINE 7.0 12/13/2021 1659   GLUCOSEU NEGATIVE 11/26/2021 1659   HGBUR SMALL (A) 11/22/2021 1659   BILIRUBINUR NEGATIVE 12/11/2021 1659   BILIRUBINUR small 01/17/2015 1139   KETONESUR 40 (A) 12/02/2021 1659   PROTEINUR NEGATIVE 12/13/2021 1659   UROBILINOGEN 1.0 01/17/2015 1139   UROBILINOGEN 0.2 05/02/2014 1652   NITRITE NEGATIVE 12/12/2021 1659   LEUKOCYTESUR TRACE (A) 11/19/2021 1659    Radiological Exams on Admission: CT HEAD WO CONTRAST (5MM)  Result Date: 12/11/2021 CLINICAL DATA:  Seizure, new-onset, history of trauma. Altered mental status EXAM: CT HEAD WITHOUT CONTRAST TECHNIQUE: Contiguous axial images were obtained from the base of the skull through the vertex without intravenous contrast. COMPARISON:   03/27/2021 FINDINGS: Brain: There is atrophy and chronic small vessel disease changes. No acute intracranial abnormality. Specifically, no hemorrhage, hydrocephalus, mass lesion, acute infarction, or significant intracranial injury. Vascular: No hyperdense vessel or unexpected calcification. Skull: No acute calvarial abnormality. Sinuses/Orbits: No acute findings Other: None IMPRESSION: Atrophy, chronic microvascular disease. No acute intracranial abnormality. Electronically Signed   By: Rolm Baptise M.D.   On: 11/24/2021 17:50   CT Cervical Spine Wo Contrast  Result Date: 11/21/2021 CLINICAL DATA:  Neck trauma (Age >= 65y).  Possible seizure EXAM: CT CERVICAL SPINE WITHOUT CONTRAST TECHNIQUE: Multidetector CT imaging of the cervical spine was performed without intravenous contrast. Multiplanar CT image reconstructions were also generated. COMPARISON:  03/27/2021 FINDINGS: Alignment: Normal. Skull base and vertebrae: No acute fracture. No primary bone lesion or focal pathologic process. Soft tissues and spinal canal: No prevertebral fluid or swelling. No visible canal hematoma. Disc levels: Disc space narrowing and early spurring in the mid to lower cervical spine. Mild bilateral multilevel degenerative facet disease. Upper chest: Biapical scarring.  No acute findings Other: None IMPRESSION: Degenerative changes.  No acute bony abnormality. Electronically Signed   By: Rolm Baptise M.D.   On: 12/10/2021 17:53   DG Chest Portable 1 View  Result Date: 12/12/2021 CLINICAL DATA:  Altered level of consciousness, possible seizure, multiple falls EXAM: PORTABLE CHEST 1 VIEW COMPARISON:  07/19/2021 FINDINGS: Single frontal view of the chest demonstrates a stable cardiac silhouette. Continued ectasia of the thoracic aorta. Chronic interstitial scarring again noted, without acute airspace disease, effusion, or pneumothorax. Subacute right posterior fourth through sixth rib fractures with abundant callus formation,  new since prior study. Unremarkable left shoulder arthroplasty. IMPRESSION: 1. Subacute healing right posterior fourth through  sixth rib fractures. 2. Chronic interstitial lung disease.  No acute process. Electronically Signed   By: Randa Ngo M.D.   On: 12/17/2021 17:25      Assessment/Plan  AMS possibly due to dehydration from decrease PO intake. Lab work shows hemoconcentration and ketones from urine. Interestingly her UDS is positive for opioids but on her SNF med rec she has not had opioids since the 11th.  Also concerning for sedative effects of opioids. -CT head negative for acute findings. MRI brain initially ordered because she appeared to have some difficulty word finding on exam and daughter wanted further work-up.  However MRI was unsuccessful due to significant motion despite low-dose IV Ativan prior -Continue IV fluid hydration overnight -Avoid sedating medication as much as possible -Obtain TSH in the morning  Chronic pain Daughter became more increasingly frustrated and angry during patient's admission since she reports that patient appears uncomfortable and keeps on grabbing at her shoulder and reporting headache.  Demanding IV pain medication and refusing lab drops.  I did discuss with her I am concerned some of her altered mental status could be due to opioid use and that we need to use it judiciously -will given PRN Norco for moderate pain and IV morphine for severe pain q3hr  Elevated Troponin Trop of 30-->64. EKG with prolonged PR interval but no other significant ST or T wave changes.  Could be demand ischemia from HTN. Low suspicion for ACS. Will continue to trend although daughter was refusing lab draws  Hypertension Uncontrolled.  Possibly secondary to pain. Pain management as above  Also PRN IV labetalol 5mg  q4hr   Hyperlipidemia Continue statin  GERD Continue PPI  DVT prophylaxis:.Lovenox Code Status: Full Family Communication: Plan discussed with  patient at bedside  disposition Plan: Home with observation Consults called:  Admission status: Observation  Level of care: Telemetry Medical  Status is: Observation  The patient remains OBS appropriate and will d/c before 2 midnights.        Orene Desanctis DO [facility]   If 7PM-7AM, please contact night-coverage [email or web address]   12/16/2021, 9:23 PM

## 2021-12-02 ENCOUNTER — Other Ambulatory Visit: Payer: Self-pay

## 2021-12-02 DIAGNOSIS — E7800 Pure hypercholesterolemia, unspecified: Secondary | ICD-10-CM | POA: Diagnosis present

## 2021-12-02 DIAGNOSIS — G9340 Encephalopathy, unspecified: Secondary | ICD-10-CM | POA: Diagnosis present

## 2021-12-02 DIAGNOSIS — Z20822 Contact with and (suspected) exposure to covid-19: Secondary | ICD-10-CM | POA: Diagnosis present

## 2021-12-02 DIAGNOSIS — Z7189 Other specified counseling: Secondary | ICD-10-CM | POA: Diagnosis not present

## 2021-12-02 DIAGNOSIS — I10 Essential (primary) hypertension: Secondary | ICD-10-CM | POA: Diagnosis present

## 2021-12-02 DIAGNOSIS — R627 Adult failure to thrive: Secondary | ICD-10-CM | POA: Diagnosis present

## 2021-12-02 DIAGNOSIS — Z66 Do not resuscitate: Secondary | ICD-10-CM | POA: Diagnosis present

## 2021-12-02 DIAGNOSIS — R778 Other specified abnormalities of plasma proteins: Secondary | ICD-10-CM

## 2021-12-02 DIAGNOSIS — G894 Chronic pain syndrome: Secondary | ICD-10-CM | POA: Diagnosis present

## 2021-12-02 DIAGNOSIS — R410 Disorientation, unspecified: Secondary | ICD-10-CM | POA: Diagnosis not present

## 2021-12-02 DIAGNOSIS — R54 Age-related physical debility: Secondary | ICD-10-CM | POA: Diagnosis present

## 2021-12-02 DIAGNOSIS — Z515 Encounter for palliative care: Secondary | ICD-10-CM | POA: Diagnosis not present

## 2021-12-02 DIAGNOSIS — R296 Repeated falls: Secondary | ICD-10-CM | POA: Diagnosis present

## 2021-12-02 DIAGNOSIS — G8929 Other chronic pain: Secondary | ICD-10-CM

## 2021-12-02 DIAGNOSIS — K219 Gastro-esophageal reflux disease without esophagitis: Secondary | ICD-10-CM | POA: Diagnosis present

## 2021-12-02 DIAGNOSIS — Z825 Family history of asthma and other chronic lower respiratory diseases: Secondary | ICD-10-CM | POA: Diagnosis not present

## 2021-12-02 DIAGNOSIS — G928 Other toxic encephalopathy: Secondary | ICD-10-CM | POA: Diagnosis present

## 2021-12-02 DIAGNOSIS — Z9181 History of falling: Secondary | ICD-10-CM | POA: Diagnosis not present

## 2021-12-02 DIAGNOSIS — Z96653 Presence of artificial knee joint, bilateral: Secondary | ICD-10-CM | POA: Diagnosis present

## 2021-12-02 DIAGNOSIS — J449 Chronic obstructive pulmonary disease, unspecified: Secondary | ICD-10-CM | POA: Diagnosis present

## 2021-12-02 DIAGNOSIS — Z79899 Other long term (current) drug therapy: Secondary | ICD-10-CM | POA: Diagnosis not present

## 2021-12-02 DIAGNOSIS — R64 Cachexia: Secondary | ICD-10-CM | POA: Diagnosis present

## 2021-12-02 DIAGNOSIS — Z96612 Presence of left artificial shoulder joint: Secondary | ICD-10-CM | POA: Diagnosis present

## 2021-12-02 DIAGNOSIS — F39 Unspecified mood [affective] disorder: Secondary | ICD-10-CM | POA: Diagnosis present

## 2021-12-02 DIAGNOSIS — Z681 Body mass index (BMI) 19 or less, adult: Secondary | ICD-10-CM | POA: Diagnosis not present

## 2021-12-02 DIAGNOSIS — Z981 Arthrodesis status: Secondary | ICD-10-CM | POA: Diagnosis not present

## 2021-12-02 MED ORDER — MORPHINE SULFATE (PF) 2 MG/ML IV SOLN
2.0000 mg | INTRAVENOUS | Status: DC | PRN
  Administered 2021-12-02: 2 mg via INTRAVENOUS

## 2021-12-02 MED ORDER — LORAZEPAM 2 MG/ML IJ SOLN: 1.0000 mg | Freq: Four times a day (QID) | INTRAMUSCULAR | Status: DC | PRN

## 2021-12-02 MED ORDER — SIMVASTATIN 20 MG PO TABS: 20.0000 mg | ORAL_TABLET | Freq: Every day | ORAL | Status: DC

## 2021-12-02 MED ORDER — ASCORBIC ACID 500 MG PO TABS: 500.0000 mg | ORAL_TABLET | Freq: Every day | ORAL | Status: DC

## 2021-12-02 MED ORDER — HYDROMORPHONE HCL 1 MG/ML IJ SOLN
1.0000 mg | Freq: Once | INTRAMUSCULAR | Status: AC
  Administered 2021-12-02: 1 mg via INTRAVENOUS
  Filled 2021-12-02: qty 1

## 2021-12-02 MED ORDER — POLYETHYLENE GLYCOL 3350 17 G PO PACK: 17.0000 g | PACK | Freq: Every day | ORAL | Status: DC | PRN

## 2021-12-02 MED ORDER — HYDROCODONE-ACETAMINOPHEN 5-325 MG PO TABS: 1.0000 | ORAL_TABLET | Freq: Four times a day (QID) | ORAL | Status: DC | PRN

## 2021-12-02 MED ORDER — FERROUS SULFATE 325 (65 FE) MG PO TABS: 325.0000 mg | ORAL_TABLET | Freq: Every day | ORAL | Status: DC

## 2021-12-02 MED ORDER — CALCITONIN (SALMON) 200 UNIT/ACT NA SOLN
1.0000 | Freq: Every day | NASAL | Status: DC
  Filled 2021-12-02: qty 3.7

## 2021-12-02 MED ORDER — SERTRALINE HCL 50 MG PO TABS: 50.0000 mg | ORAL_TABLET | Freq: Every day | ORAL | Status: DC

## 2021-12-02 MED ORDER — LORAZEPAM 2 MG/ML IJ SOLN: 1.0000 mg | INTRAMUSCULAR | Status: DC | PRN

## 2021-12-02 MED ORDER — SENNOSIDES-DOCUSATE SODIUM 8.6-50 MG PO TABS
1.0000 | ORAL_TABLET | Freq: Two times a day (BID) | ORAL | Status: DC
  Filled 2021-12-02: qty 1

## 2021-12-02 MED ORDER — HYDROMORPHONE HCL 1 MG/ML IJ SOLN
1.0000 mg | INTRAMUSCULAR | Status: DC
  Administered 2021-12-02 – 2021-12-03 (×8): 1 mg via INTRAVENOUS
  Filled 2021-12-02 (×8): qty 1

## 2021-12-02 MED ORDER — SODIUM CHLORIDE 0.9 % IV SOLN
25.0000 mg | Freq: Four times a day (QID) | INTRAVENOUS | Status: DC | PRN
  Filled 2021-12-02: qty 1

## 2021-12-02 MED ORDER — PANTOPRAZOLE SODIUM 40 MG PO TBEC: 40.0000 mg | DELAYED_RELEASE_TABLET | Freq: Every day | ORAL | Status: DC

## 2021-12-02 MED ORDER — MORPHINE SULFATE (PF) 2 MG/ML IV SOLN: 2.0000 mg | INTRAVENOUS | Status: DC | PRN

## 2021-12-02 MED ORDER — LABETALOL HCL 5 MG/ML IV SOLN: 5.0000 mg | INTRAVENOUS | Status: DC | PRN

## 2021-12-02 MED ORDER — MORPHINE SULFATE (PF) 2 MG/ML IV SOLN
2.0000 mg | INTRAVENOUS | Status: DC | PRN
  Filled 2021-12-02: qty 1

## 2021-12-02 MED ORDER — ONDANSETRON HCL 4 MG/2ML IJ SOLN
4.0000 mg | Freq: Four times a day (QID) | INTRAMUSCULAR | Status: DC | PRN
  Administered 2021-12-02: 4 mg via INTRAVENOUS
  Filled 2021-12-02: qty 2

## 2021-12-02 MED ORDER — ALBUTEROL SULFATE (2.5 MG/3ML) 0.083% IN NEBU: 2.5000 mg | INHALATION_SOLUTION | RESPIRATORY_TRACT | Status: DC | PRN

## 2021-12-02 MED ORDER — TAMSULOSIN HCL 0.4 MG PO CAPS: 0.4000 mg | ORAL_CAPSULE | Freq: Every day | ORAL | Status: DC

## 2021-12-02 NOTE — Consult Note (Signed)
Palliative Medicine Inpatient Consult Note  Consulting Provider: Vernelle Emerald, MD  Reason for consult:   [name] Palliative Medicine Consult  Reason for Consult? end of life care    HPI:  Per intake H&P --> Savannah Coleman is a 87 y.o. female with medical history significant for hypertension, COPD, hyperlipidemia and GERD who presents with altered mental status thought to be in the setting of decreased oral intake.  Palliative care has been asked to get involved to discuss goals of care in the setting of declining physical function.  Clinical Assessment/Goals of Care:  *Please note that this is a verbal dictation therefore any spelling or grammatical errors are due to the "[name]" system interpretation.  I have reviewed medical records including EPIC notes, labs and imaging, received report from bedside RN, assessed the patient who is in bed opens eyes and responds though falls back asleep shortly thereafter.    I met with Savannah Coleman's son at bedside to further discuss diagnosis prognosis, GOC, EOL wishes, disposition and options.   I introduced Palliative Medicine as specialized medical care for people living with serious illness. It focuses on providing relief from the symptoms and stress of a serious illness. The goal is to improve quality of life for both the patient and the family.  Savannah Coleman is from [name] and came to the [place] in 1966.  She has never been married she has four children -2 daughters and 2 sons.  She worked as a Radiation protection practitioner until retirement.  She is a woman of faith and practices within the [employer] denomination.  Prior to admission Savannah Coleman had been living at [name] for the past year.  She had had a fairly tumultuous course over the past 2 years inclusive of leaving [name] to live with her daughter.  While living with her daughter she endured a fall with multiple fractures.  She has  had multiple kyphoplasties and surgical procedures leaving her in a great deal of chronic pain.  As of most recently, within the last 2 weeks Savannah Coleman has had a decreased oral intake and has been less steady on her feet.  She uses a Rollator for walking though has not been able to recognize using the brake leading to 2 more falls.  A detailed discussion was had today regarding advanced directives, Savannah Coleman has 4 children though her youngest daughter Savannah Coleman is her primary Media planner.    Concepts specific to code status, artifical feeding and hydration, continued IV antibiotics and rehospitalization was had.  Patient is DNAR/DNI.  Goals are for comfort per the family.  We talked about transition to comfort measures in house and what that would entail inclusive of medications to control pain, dyspnea, agitation, nausea, itching, and hiccups.  We discussed stopping all uneccessary measures such as blood draws, needle sticks, and frequent vital signs.   Patient's son shares that he and his siblings have all decided they did not want to see their mother suffer any longer.  They have been feeling for a while that her time is coming and believe that she has declared herself.  We reviewed the desire to not pursue any more aggressive measures and to only focus on alleviation of any suffering.  I reviewed with patient's son that she is a good candidate for inpatient hospice given her failure to thrive I suspect she has a less than 2-week prognosis though I do not see that she is actively dying presently requiring  her to stay inpatient.  He is amenable to talking to the case management team for inpatient hospice placement.  I spoke to patient's bedside RN, [name] and informed her of the plan for comfort mediated care and potential inpatient hospice placement.  Decision Maker: Savannah Coleman (daughter) [phone]  SUMMARY OF RECOMMENDATIONS   DNAR/DNI  Comfort care  Comfort medications per MAR-we  will add around-the-clock low-dose Dilaudid to address chronic pain  Appreciate TOC aiding in inpatient hospice placement  Ongoing palliative care support  Code Status/Advance Care Planning: DNAR/DNI   Palliative Prophylaxis:  Oral care, turn every 2 hours, pain management  Additional Recommendations (Limitations, Scope, Preferences): Continue current scope of care-comfort   Psycho-social/Spiritual:  Desire for further Chaplaincy support: Declines-patient's [name] pastor will be coming in Additional Recommendations: Education on death and dying process.  Comfort mediated care   Prognosis: Limited days to < 2 weeks  Discharge Planning: Discharge will be to an inpatient hospice home  Vitals:   12/02/21 0235 12/02/21 0328  BP: (!) 151/89 (!) 175/112  Pulse: 70 80  Resp:  18  Temp: 97.8 F (36.6 C) 98.3 F (36.8 C)  SpO2: 97% 100%    Intake/Output Summary (Last 24 hours) at 12/02/2021 7871 Last data filed at 12/02/2021 8367 Gross per 24 hour  Intake 0 ml  Output --  Net 0 ml   Last Weight  Most recent update: 11/23/2021  4:45 PM    Weight  45.4 kg (100 lb)            Gen: Frail cachectic elderly female in no acute distress HEENT: moist mucous membranes CV: Regular rate and rhythm PULM: On 4 L nasal cannula ABD: soft/nontender EXT: Ecchymosis noted in bilateral lower extremities and upper extremities Neuro: Alert and oriented to self does not know where she is nor does she understand situation  PPS: 10%   This conversation/these recommendations were discussed with patient primary care team, Dr. Sloan Leiter  Time In: 0850 Time Out: 1000 Total Time: 70  Greater than 50%  of this time was spent counseling and coordinating care related to the above assessment and plan.  [facility] Team Team Cell Phone: [phone] Please utilize secure chat with additional questions, if there is no response within 30 minutes please call  the above phone number  Palliative Medicine Team providers are available by phone from 7am to 7pm daily and can be reached through the team cell phone.  Should this patient require assistance outside of these hours, please call the patient's attending physician.

## 2021-12-02 NOTE — ED Notes (Signed)
Daughter reports that her mother has had a hx of nausea after pain medication and would like to give her something before it makes patient nauseated.  Patient medicated per PRN order

## 2021-12-02 NOTE — TOC Progression Note (Addendum)
Transition of Care [facility]) - Initial/Assessment Note    Patient Details  Name: Savannah Coleman MRN: [id number] Date of Birth: 04/30/1931  Transition of Care [facility]) CM/SW Contact:    Milinda Antis, [place] Phone Number: 12/02/2021, 3:27 PM  Clinical Narrative:                 CSW contacted Olivia Mackie with admissions at [facility] and confirmed that the patient can return to the facility tomorrow.    Expected Discharge Plan: Skilled Nursing Facility Barriers to Discharge: Continued Medical Work up   Patient Goals and CMS Choice Patient states their goals for this hospitalization and ongoing recovery are:: To return to [employer] Medicare.gov Compare Post Acute Care list provided to:: Other (Comment Required) (Adult children) Choice offered to / list presented to : Adult Children  Expected Discharge Plan and Services Expected Discharge Plan: [place]   Discharge Planning Services: CM Consult Post Acute Care Choice: [place] Living arrangements for the past 2 months: [place]                 DME Arranged: N/A DME Agency: NA         HH Agency: NA        Prior Living Arrangements/Services Living arrangements for the past 2 months: [place] Lives with:: Facility Resident Patient language and need for interpreter reviewed:: Yes Do you feel safe going back to the place where you live?: Yes      Need for Family Participation in Patient Care: Yes (Comment) Care giver support system in place?: Yes (comment)   Criminal Activity/Legal Involvement Pertinent to Current Situation/Hospitalization: No - Comment as needed  Activities of Daily Living Home Assistive Devices/Equipment: Other (Comment) (rollator) ADL Screening (condition at time of admission) Patient's cognitive ability adequate to safely complete daily activities?: No Is the patient deaf or have difficulty hearing?: Yes Does the patient have difficulty seeing,  even when wearing glasses/contacts?: No Does the patient have difficulty concentrating, remembering, or making decisions?: Yes Patient able to express need for assistance with ADLs?: No Does the patient have difficulty dressing or bathing?: Yes Independently performs ADLs?: No Does the patient have difficulty walking or climbing stairs?: Yes Weakness of Legs: Both Weakness of Arms/Hands: Both  Permission Sought/Granted Permission sought to share information with : Case Manager, Customer service manager, Family Supports Permission granted to share information with : Yes, Verbal Permission Granted              Emotional Assessment Appearance:: Appears stated age Attitude/Demeanor/Rapport: Lethargic Affect (typically observed): Appropriate Orientation: : Oriented to Self Alcohol / Substance Use: Not Applicable Psych Involvement: No (comment)  Admission diagnosis:  Encephalopathy [G93.40] Hypertension, unspecified type [I10] AMS (altered mental status) [R41.82] Encephalopathy acute [G93.40] Patient Active Problem List   Diagnosis Date Noted   Elevated troponin 12/02/2021   Chronic pain 12/02/2021   Encephalopathy acute 12/02/2021   AMS (altered mental status) 11/18/2021   E. coli UTI    Hypophosphatemia    Nephrolithiasis 07/20/2021   GERD (gastroesophageal reflux disease) 07/20/2021   Acute cystitis without hematuria    Sepsis ([place]) 07/19/2021   Postoperative anemia due to acute blood loss 03/29/2021   Multiple fractures 03/27/2021   Hypokalemia 03/27/2021   Recurrent falls 03/27/2021   Closed fracture of neck of left femur ([place]) 03/27/2021   Closed fracture of left distal radius and ulna, initial encounter 03/27/2021   Left humeral neck fracture 03/27/2021  Essential hypertension 03/27/2021   Hyperlipidemia 03/27/2021   Resides in long term care facility 03/27/2021   Osteoporosis 03/27/2021   Closed fracture of anatomical neck of left humerus    Fall at  nursing home    Closed fracture of left distal radius    Intertrochanteric fracture of right hip ([place]) 04/17/2020   Closed fracture of right distal radius 04/17/2020   Unspecified constipation 05/05/2014   Postoperative anemia due to acute blood loss 05/02/2014   Femur fracture, right ([place]) 04/30/2014   Hypertension    Hyperlipidemia    Adenomatous colon polyp    History of shingles    Rosacea    Positive PPD    Blood transfusion 03/19/2011   PCP:  Merryl Hacker No Pharmacy:   [facility], [address] [place] [address] Phone: [phone] Fax: [phone]     Social Determinants of Health (SDOH) Interventions    Readmission Risk Interventions No flowsheet data found.

## 2021-12-02 NOTE — ED Notes (Signed)
Patient's daughter was informed by another staff member that patient had a bed earlier and questioning why she no longer had one.  Attempted to explain possible reasoning and informed daughter that bed assignments were not something we could control.  Spoke to Agricultural consultant and inform her of daughter's concerns.  Daughter requesting to speak to charge RN.  Would like patient to get an admission room in order for patient to get comfortable and settled for night.

## 2021-12-02 NOTE — Progress Notes (Signed)
Manufacturing engineer [facility])  Referral received for EOL care at [facility]. Although patient has had a significant decline, it is not clear if her decline is secondary to a reversible cause.   Discussed with Santiago Glad and Maudie Mercury at the bedside, they are comfortable with her returning to [facility] for EOL.  Discussed with Andee Poles, administrator at [facility], they cannot accept her back today, but are happy to accept her tomorrow.   April is the contact at [facility] on Saturday and Sunday, she can be reached at [phone]. [facility] will be happy to have her return on Saturday.  Please send completed DNR.  Please schedule morphine for comfort. Family was concerned about it not getting administered if it was written as PRN.  Thank you, Venia Carbon RN, BSN [facility] Liaison

## 2021-12-02 NOTE — Progress Notes (Signed)
°   12/02/21 0936  Clinical Encounter Type  Visited With Patient and family together;Family  Visit Type Initial;Spiritual support;Psychological support;Social support  Referral From Family;Nurse    [facility] visited pt. per [facility] consult for prayer and support; when [facility] entered, pt. lying in bed awake but drowsy with son Savannah Coleman at bedside.  Son shared that pt.'s health has declined significantly in the last two years as she has suffered repeated falls and broken bones and undergone several surgeries to address these injuries.  Pt. had been in assisted living two years ago but now lives in a nursing facility.  Son shared the impression that pt. has been suffering and expressed a desire that she be comfortable; son says Sharyn Lull w/PMT visited just before [facility] arrived and discussed options going forward, including inpatient hospice.  Son says pt. would have been against discussing hospice several months ago but believes this may be a good option given family's desire to focus on comfort and given pt.'s repeated hospitalizations and pain from fall injuries.  Pt. woke up during visit and appeared undistressed, sharing "I feel good," but also seemed confused.  Pt.'s dtr. will be arriving shortly.  Pt. and family are aware of chaplains' availability as needed.

## 2021-12-02 NOTE — Progress Notes (Signed)
HOSPITAL MEDICINE OVERNIGHT EVENT NOTE    Called by bedside and charge nurse to come to the unit as family has been visibly dissatisfied with the patient's clinical course and delivered treatment thus far.  Daughter and son who insisted to me that they "do not want her to suffer anymore."  They are visibly upset, stating that patient was subjected to much discomfort undergoing her work-up in the emergency department leading to her hospitalization.  I have informed the children that the patient has undergone a thorough and appropriate work-up and at this point we still do not have an explanation for the underlying cause of the patient's symptoms of confusion, lethargy and generalized pain.  Despite this inconclusive work-up son and daughter continued to state that they wish the patient not be exposed to any discomfort going forward.  They have asked that only treatments relating to the patient's discomfort to be administered going forward.  As a result of this conversation the following will be enacted: Comfort care status has been ordered Patient is currently DNR Supplemental oxygen has been discontinued As needed low-dose morphine has been ordered for pain As needed Ativan has been ordered for anxiety As needed albuterol has been ordered for shortness of breath and wheezing As needed Zofran has been ordered for nausea As needed MiraLAX has been ordered for constipation  I have declined to initiate a morphine infusion at this time as a obvious underlying cause of the patient's condition is not clear.  It is still possible that the patient is suffering from a self-limiting condition that may resolve spontaneously.  Continue to monitor closely.  Vernelle Emerald  MD [facility]

## 2021-12-02 NOTE — ED Notes (Signed)
Daughter updated and made aware that MD will be down to speak to her shortly

## 2021-12-02 NOTE — ED Notes (Signed)
Patient's daughter requesting to have MD paged.  Dr. Flossie Buffy paged and in route to speak to daughter

## 2021-12-02 NOTE — ED Notes (Signed)
Patient's daughter to nursing desk requesting pain medication for her mother.  States she is complaining of worsening HA and holding L chest and shoulder.  Has hx of fractures to bilateral shoulders.  Daughter was aware that MD was to order a one time dose after speaking to her.

## 2021-12-02 NOTE — ED Notes (Signed)
Patient daughter requesting more pain medication for her mother.  MD made aware

## 2021-12-02 NOTE — Plan of Care (Signed)
  Problem: Pain Managment: Goal: General experience of comfort will improve Outcome: Progressing   

## 2021-12-02 NOTE — ED Notes (Signed)
Patient's daughter does not want to have any lab work drawn at this time.  Continues to want comfort measures only

## 2021-12-02 NOTE — Progress Notes (Signed)
PROGRESS NOTE        PATIENT DETAILS Name: Savannah Coleman Age: 87 y.o. Sex: female Date of Birth: [birth year] Admit Date: 12/12/2021 Admitting Physician Orene Desanctis, DO PCP:Pcp, No  Brief Narrative: Patient is a 87 y.o. female with history of frequent falls, chronic pain syndrome on narcotics, failure to thrive syndrome with worsening functional status-SNF resident-who presented with acute metabolic encephalopathy.  See below for further details.  Subjective: Appears very frail/weak-but more awake and alert than yesterday.  Son at bedside.  Objective: Vitals: Blood pressure (!) 175/112, pulse 80, temperature 98.3 F (36.8 C), temperature source Oral, resp. rate 18, height 5\' 3"  (1.6 m), weight 45.4 kg, SpO2 100 %.   Exam: Gen Exam: Frail/weak appearing-but not in any distress. HEENT:atraumatic, normocephalic Chest: B/L clear to auscultation anteriorly CVS:S1S2 regular Abdomen:soft non tender, non distended Extremities:no edema Neurology: Has generalized weakness-but nonfocal exam Skin: no rash  Pertinent Labs/Radiology: Recent Labs  Lab 11/30/2021 1658  WBC 9.5  HGB 15.6*  PLT 312  NA 139  K 3.5  CREATININE 0.95  AST 32  ALT 19  ALKPHOS 121  BILITOT 1.3*    Assessment/Plan: Acute toxic metabolic encephalopathy: Unclear etiology-neuroimaging negative-no evidence of ongoing infection-no major electrolyte abnormalities.  There are some concern that this could have been from narcotics but patient is on chronic narcotics at home.  Some question whether patient may have had seizure-like activity at SNF-family does not desire any further work-up including EEG.  Long discussion with patient's son at bedside but this MD.  Goals of care mostly for comfort.   Frequent falls resulting in numerous fractures-chronic pain syndrome: Continue narcotics per palliative care.  HTN: No role for antihypertensives-as comfort care in progress.  HLD: No role for  statins as comfort care  GERD: On PPI  Mood disorder: On Zoloft  Goals of care: Long discussion with son at bedside-DNR in place-does not desire any further diagnostic procedures including EEG and blood work-comfort measures in place.  Palliative care following will await further recommendations.  BMI Estimated body mass index is 17.71 kg/m as calculated from the following:   Height as of this encounter: 5\' 3"  (1.6 m).   Weight as of this encounter: 45.4 kg.    Procedures: None Consults: Palliative care DVT Prophylaxis: Not needed-as comfort care Code Status DNR Family Communication: Son at bedside  Time spent: 76 minutes-Greater than 50% of this time was spent in counseling, explanation of diagnosis, planning of further management, and coordination of care.   Disposition Plan: Status is: Observation  The patient will require care spanning > 2 midnights and should be moved to inpatient because: Comfort care-likely will need residential hospice.       Diet: Diet Order             Diet Heart Room service appropriate? Yes; Fluid consistency: Thin  Diet effective now                     Antimicrobial agents: Anti-infectives (From admission, onward)    None        MEDICATIONS: Scheduled Meds:   HYDROmorphone (DILAUDID) injection  1 mg Intravenous Q4H   pantoprazole  40 mg Oral Daily   senna-docusate  1 tablet Oral BID   sertraline  50 mg Oral Daily   Continuous Infusions: PRN Meds:.acetaminophen, albuterol, labetalol,  LORazepam, ondansetron (ZOFRAN) IV, polyethylene glycol   I have personally reviewed following labs and imaging studies  LABORATORY DATA: CBC: Recent Labs  Lab 12/15/2021 1658  WBC 9.5  NEUTROABS 6.4  HGB 15.6*  HCT 46.5*  MCV 96.1  PLT 384    Basic Metabolic Panel: Recent Labs  Lab 12/15/2021 1658  NA 139  K 3.5  CL 101  CO2 25  GLUCOSE 122*  BUN 15  CREATININE 0.95  CALCIUM 10.0    GFR: Estimated Creatinine  Clearance: 29.3 mL/min (by C-G formula based on SCr of 0.95 mg/dL).  Liver Function Tests: Recent Labs  Lab 11/18/2021 1658  AST 32  ALT 19  ALKPHOS 121  BILITOT 1.3*  PROT 7.0  ALBUMIN 3.7   No results for input(s): LIPASE, AMYLASE in the last 168 hours. No results for input(s): AMMONIA in the last 168 hours.  Coagulation Profile: No results for input(s): INR, PROTIME in the last 168 hours.  Cardiac Enzymes: No results for input(s): CKTOTAL, CKMB, CKMBINDEX, TROPONINI in the last 168 hours.  BNP (last 3 results) No results for input(s): PROBNP in the last 8760 hours.  Lipid Profile: No results for input(s): CHOL, HDL, LDLCALC, TRIG, CHOLHDL, LDLDIRECT in the last 72 hours.  Thyroid Function Tests: No results for input(s): TSH, T4TOTAL, FREET4, T3FREE, THYROIDAB in the last 72 hours.  Anemia Panel: No results for input(s): VITAMINB12, FOLATE, FERRITIN, TIBC, IRON, RETICCTPCT in the last 72 hours.  Urine analysis:    Component Value Date/Time   COLORURINE YELLOW 12/14/2021 [place] 12/17/2021 1659   LABSPEC 1.020 12/02/2021 1659   PHURINE 7.0 11/22/2021 1659   GLUCOSEU NEGATIVE 11/19/2021 1659   HGBUR SMALL (A) 11/23/2021 1659   BILIRUBINUR NEGATIVE 12/09/2021 1659   BILIRUBINUR small 01/17/2015 1139   KETONESUR 40 (A) 11/28/2021 1659   PROTEINUR NEGATIVE 11/30/2021 1659   UROBILINOGEN 1.0 01/17/2015 1139   UROBILINOGEN 0.2 05/02/2014 1652   NITRITE NEGATIVE 11/29/2021 1659   LEUKOCYTESUR TRACE (A) 12/09/2021 1659    Sepsis Labs: Lactic Acid, Venous    Component Value Date/Time   LATICACIDVEN 1.2 07/19/2021 2225    MICROBIOLOGY: Recent Results (from the past 240 hour(s))  Resp Panel by RT-PCR (Flu A&B, Covid) Nasopharyngeal Swab     Status: None   Collection Time: 11/30/2021  4:48 PM   Specimen: Nasopharyngeal Swab; Nasopharyngeal(NP) swabs in vial transport medium  Result Value Ref Range Status   SARS Coronavirus 2 by RT PCR NEGATIVE  NEGATIVE Final    Comment: (NOTE) SARS-CoV-2 target nucleic acids are NOT DETECTED.  The SARS-CoV-2 RNA is generally detectable in upper respiratory specimens during the acute phase of infection. The lowest concentration of SARS-CoV-2 viral copies this assay can detect is 138 copies/mL. A negative result does not preclude SARS-Cov-2 infection and should not be used as the sole basis for treatment or other patient management decisions. A negative result may occur with  improper specimen collection/handling, submission of specimen other than nasopharyngeal swab, presence of viral mutation(s) within the areas targeted by this assay, and inadequate number of viral copies(<138 copies/mL). A negative result must be combined with clinical observations, patient history, and epidemiological information. The expected result is Negative.  Fact Sheet for Patients:  EntrepreneurPulse.com.au  Fact Sheet for Healthcare Providers:  IncredibleEmployment.be  This test is no t yet approved or cleared by the [place] FDA and  has been authorized for detection and/or diagnosis of SARS-CoV-2 by FDA under an Emergency Use Authorization (EUA).  This EUA will remain  in effect (meaning this test can be used) for the duration of the COVID-19 declaration under Section 564(b)(1) of the Act, 21 U.S.C.section 360bbb-3(b)(1), unless the authorization is terminated  or revoked sooner.       Influenza A by PCR NEGATIVE NEGATIVE Final   Influenza B by PCR NEGATIVE NEGATIVE Final    Comment: (NOTE) The Xpert Xpress SARS-CoV-2/FLU/RSV plus assay is intended as an aid in the diagnosis of influenza from Nasopharyngeal swab specimens and should not be used as a sole basis for treatment. Nasal washings and aspirates are unacceptable for Xpert Xpress SARS-CoV-2/FLU/RSV testing.  Fact Sheet for Patients: EntrepreneurPulse.com.au  Fact Sheet for Healthcare  Providers: IncredibleEmployment.be  This test is not yet approved or cleared by the [place] FDA and has been authorized for detection and/or diagnosis of SARS-CoV-2 by FDA under an Emergency Use Authorization (EUA). This EUA will remain in effect (meaning this test can be used) for the duration of the COVID-19 declaration under Section 564(b)(1) of the Act, 21 U.S.C. section 360bbb-3(b)(1), unless the authorization is terminated or revoked.  Performed at [facility], [place] [address] [place], [place] [address]     RADIOLOGY STUDIES/RESULTS: CT HEAD WO CONTRAST (5MM)  Result Date: 12/05/2021 CLINICAL DATA:  Seizure, new-onset, history of trauma. Altered mental status EXAM: CT HEAD WITHOUT CONTRAST TECHNIQUE: Contiguous axial images were obtained from the base of the skull through the vertex without intravenous contrast. COMPARISON:  03/27/2021 FINDINGS: Brain: There is atrophy and chronic small vessel disease changes. No acute intracranial abnormality. Specifically, no hemorrhage, hydrocephalus, mass lesion, acute infarction, or significant intracranial injury. Vascular: No hyperdense vessel or unexpected calcification. Skull: No acute calvarial abnormality. Sinuses/Orbits: No acute findings Other: None IMPRESSION: Atrophy, chronic microvascular disease. No acute intracranial abnormality. Electronically Signed   By: Rolm Baptise M.D.   On: 12/04/2021 17:50   CT Cervical Spine Wo Contrast  Result Date: 11/28/2021 CLINICAL DATA:  Neck trauma (Age >= 65y).  Possible seizure EXAM: CT CERVICAL SPINE WITHOUT CONTRAST TECHNIQUE: Multidetector CT imaging of the cervical spine was performed without intravenous contrast. Multiplanar CT image reconstructions were also generated. COMPARISON:  03/27/2021 FINDINGS: Alignment: Normal. Skull base and vertebrae: No acute fracture. No primary bone lesion or focal pathologic process. Soft tissues and spinal canal: No prevertebral  fluid or swelling. No visible canal hematoma. Disc levels: Disc space narrowing and early spurring in the mid to lower cervical spine. Mild bilateral multilevel degenerative facet disease. Upper chest: Biapical scarring.  No acute findings Other: None IMPRESSION: Degenerative changes.  No acute bony abnormality. Electronically Signed   By: Rolm Baptise M.D.   On: 12/04/2021 17:53   MR BRAIN WO CONTRAST  Result Date: 12/11/2021 CLINICAL DATA:  Mental status change EXAM: MRI HEAD WITHOUT CONTRAST TECHNIQUE: Multiplanar, multiecho pulse sequences of the brain and surrounding structures were obtained without intravenous contrast. COMPARISON:  No prior MRI, correlation is made with 11/27/2021 CT head FINDINGS: Evaluation is significantly limited by motion artifact, the entire exam could not be completed. Within the aforementioned motion limitation, there is no definite restricted diffusion to suggest acute or subacute infarct. The remaining sequences are nondiagnostic. IMPRESSION: Evaluation is significantly limited by motion artifact and limited sequences obtained. Within this limitation, there is no definite restricted diffusion to suggest acute or subacute infarct. Electronically Signed   By: Merilyn Baba M.D.   On: 12/05/2021 23:39   DG Chest Portable 1 View  Result Date: 11/24/2021 CLINICAL DATA:  Altered level of consciousness, possible seizure, multiple falls EXAM: PORTABLE CHEST 1 VIEW COMPARISON:  07/19/2021 FINDINGS: Single frontal view of the chest demonstrates a stable cardiac silhouette. Continued ectasia of the thoracic aorta. Chronic interstitial scarring again noted, without acute airspace disease, effusion, or pneumothorax. Subacute right posterior fourth through sixth rib fractures with abundant callus formation, new since prior study. Unremarkable left shoulder arthroplasty. IMPRESSION: 1. Subacute healing right posterior fourth through sixth rib fractures. 2. Chronic interstitial lung  disease.  No acute process. Electronically Signed   By: Randa Ngo M.D.   On: 12/11/2021 17:25     LOS: 0 days   Oren Binet, MD  [facility]    To contact the attending provider between 7A-7P or the covering provider during after hours 7P-7A, please log into the web site [email or web address] and access using universal [place] password for that web site. If you do not have the password, please call the hospital operator.  12/02/2021, 12:04 PM

## 2021-12-02 NOTE — TOC Initial Note (Signed)
Transition of Care [facility]) - Initial/Assessment Note    Patient Details  Name: Savannah Coleman MRN: [id number] Date of Birth: February 24, 1931  Transition of [facility]) CM/SW Contact:    Tom-Johnson, Renea Ee, RN Phone Number: 12/02/2021, 1:41 PM  Clinical Narrative:                 CM consulted for [facility]idential hosp[facility]. CM spoke with patient's son, Richardson Landry and daughter, Joelene Millin as patient is too lethargic to communicate. Joelene Millin and Richardson Landry had questions about going to [facility] or [facility]. CM told them that a representative from hospice will talk with them and give them thew information they need. CM reached out to [place] with [facility], as daughter had previously contacted them. Anderson Malta states she contacted patient's children and they decided for patient to return back to [facility] as patient is not [facility] appropriate. CM will assist with and continue to follow with appropriate needs.     Expected Discharge Plan: Skilled Nursing Facility Barriers to Discharge: Continued Medical Work up   Patient Goals and CMS Choice Patient states their goals for this hospitalization and ongoing recovery are:: To return to [employer] Medicare.gov Compare Post Acute Care list provided to:: Other (Comment Required) (Adult children) Choice offered to / list presented to : Adult Children  Expected Discharge Plan and Services Expected Discharge Plan: [place]   Discharge Planning Services: CM Consult Post Acute Care Choice: [place] Living arrangements for the past 2 months: [place]                 DME Arranged: N/A DME Agency: NA         HH Agency: NA        Prior Living Arrangements/Services Living arrangements for the past 2 months: [place] Lives with:: Facility Resident Patient language and need for interpreter reviewed:: Yes Do you feel safe going back to the place where you live?: Yes      Need for  Family Participation in Patient Care: Yes (Comment) Care giver support system in place?: Yes (comment)   Criminal Activity/Legal Involvement Pertinent to Current Situation/Hospitalization: No - Comment as needed  Activities of Daily Living Home Assistive Devices/Equipment: Other (Comment) (rollator) ADL Screening (condition at time of admission) Patient's cognitive ability adequate to safely complete daily activities?: No Is the patient deaf or have difficulty hearing?: Yes Does the patient have difficulty seeing, even when wearing glasses/contacts?: No Does the patient have difficulty concentrating, remembering, or making decisions?: Yes Patient able to express need for assistance with ADLs?: No Does the patient have difficulty dressing or bathing?: Yes Independently performs ADLs?: No Does the patient have difficulty walking or climbing stairs?: Yes Weakness of Legs: Both Weakness of Arms/Hands: Both  Permission Sought/Granted Permission sought to share information with : Case Manager, Customer service manager, Family Supports Permission granted to share information with : Yes, Verbal Permission Granted              Emotional Assessment Appearance:: Appears stated age Attitude/Demeanor/Rapport: Lethargic Affect (typically observed): Appropriate Orientation: : Oriented to Self Alcohol / Substance Use: Not Applicable Psych Involvement: No (comment)  Admission diagnosis:  Encephalopathy [G93.40] Hypertension, unspecified type [I10] AMS (altered mental status) [R41.82] Encephalopathy acute [G93.40] Patient Active Problem List   Diagnosis Date Noted   Elevated troponin 12/02/2021   Chronic pain 12/02/2021   Encephalopathy acute 12/02/2021   AMS (altered mental status) 11/30/2021   E. coli UTI  Hypophosphatemia    Nephrolithiasis 07/20/2021   GERD (gastroesophageal reflux disease) 07/20/2021   Acute cystitis without hematuria    Sepsis ([place]) 07/19/2021    Postoperative anemia due to acute blood loss 03/29/2021   Multiple fractures 03/27/2021   Hypokalemia 03/27/2021   Recurrent falls 03/27/2021   Closed fracture of neck of left femur ([place]) 03/27/2021   Closed fracture of left distal radius and ulna, initial encounter 03/27/2021   Left humeral neck fracture 03/27/2021   Essential hypertension 03/27/2021   Hyperlipidemia 03/27/2021   Resides in long term care facility 03/27/2021   Osteoporosis 03/27/2021   Closed fracture of anatomical neck of left humerus    Fall at nursing home    Closed fracture of left distal radius    Intertrochanteric fracture of right hip ([place]) 04/17/2020   Closed fracture of right distal radius 04/17/2020   Unspecified constipation 05/05/2014   Postoperative anemia due to acute blood loss 05/02/2014   Femur fracture, right ([place]) 04/30/2014   Hypertension    Hyperlipidemia    Adenomatous colon polyp    History of shingles    Rosacea    Positive PPD    Blood transfusion 03/19/2011   PCP:  Merryl Hacker No Pharmacy:   [facility] [place] [address] Phone: [phone] Fax: [phone]     Social Determinants of Health (SDOH) Interventions    Readmission Risk Interventions No flowsheet data found.

## 2021-12-03 MED ORDER — HYDROMORPHONE HCL 1 MG/ML IJ SOLN
2.0000 mg | Freq: Once | INTRAMUSCULAR | Status: AC
  Administered 2021-12-03: 2 mg via INTRAVENOUS
  Filled 2021-12-03: qty 2

## 2021-12-03 MED ORDER — GLYCOPYRROLATE 0.2 MG/ML IJ SOLN
0.4000 mg | INTRAMUSCULAR | Status: DC
  Administered 2021-12-03: 0.4 mg via INTRAVENOUS
  Filled 2021-12-03: qty 2

## 2021-12-03 MED ORDER — ACETAMINOPHEN 650 MG RE SUPP: 325.0000 mg | RECTAL | Status: DC | PRN

## 2021-12-03 MED ORDER — HYDROMORPHONE HCL 1 MG/ML IJ SOLN
1.0000 mg | INTRAMUSCULAR | Status: DC | PRN
  Administered 2021-12-03 (×2): 1 mg via INTRAVENOUS
  Filled 2021-12-03 (×2): qty 1

## 2021-12-18 NOTE — Progress Notes (Signed)
°   12-27-21 1623  Attending [place]  Attending Physician Notified Y  Attending Physician (First and Last Name) Oren Binet  Post Mortem Checklist  Date of Death 2021-12-27  Time of Death 1537/03/26  Pronounced By Eduard Clos RN  Next of kin notified Yes (Children at bedside)  Was the patient a No Code Blue or a Limited Code Blue? No  Did the patient die unattended? No  Patient restrained? Not applicable  Height 5\' 3"  (1.6 m)  Weight 45.4 kg  [facility] (previously known as [place])  Notification Date 2021/12/27  Notification Time [place]  [facility] Number [id number]  Is patient a potential donor? Y  Donation Type Eyes  Eye prep completed Yes  Autopsy  Autopsy requested by N/A  Patient and Ensign Returned  Patient is satisfied that all belongings have been returned? Yes  Medical Examiner  Is this a medical examiner's case? [place] home name/address/phone # [facility] not listed [address] [place] [address] [place] [address]. [place] [address])  Name/Address/Phone # of [facility] [place]. [place] [address]  Planned location of pickup Room

## 2021-12-18 NOTE — Progress Notes (Addendum)
Palliative Medicine Inpatient Follow Up Note  Consulting Provider: Vernelle Emerald, MD   Reason for consult:   Center Palliative Medicine Consult  Reason for Consult? end of life care      HPI:  Per intake H&P --> Savannah Coleman is a 88 y.o. female with medical history significant for hypertension, COPD, hyperlipidemia and GERD who presents with altered mental status thought to be in the setting of decreased oral intake.  Palliative care has been asked to get involved to discuss goals of care in the setting of declining physical function.  Today's Discussion (2021-12-23):  *Please note that this is a verbal dictation therefore any spelling or grammatical errors are due to the "[employer] One" system interpretation.  Chart reviewed.  I have met with Savannah Coleman's daughter Savannah Coleman at bedside.  We reviewed that Savannah Coleman has had a significant change in her breathing pattern yesterday to today.  I shared that this is a normal part of the transitional process at the end of life to have Savannah Coleman-Stokes respirations.  We discussed other indicators that Savannah Coleman is getting closer to end-of-life inclusive of her lack of alertness.  Savannah Coleman shares that she feels her mother will not be long for this world which I agreed with.  She request to myself and Dr. Sloan Leiter that Savannah Coleman, The stay in the hospital during her final phases.  At this point in time this appears to be a very reasonable request as anticipate Savannah Coleman may pass in the next few hours to day.  Questions and concerns addressed   Palliative support provided ________________________________ Addendum:  I was asked by nursing to go to bedside to discuss patients clinical state. I was able to provide patients daughter(s) with an update. We reviewed that patients breathing had changed and she was cool to the touch in her extremities. I shared that I suspect her time is limited to hours.   Objective  Assessment: Vital Signs Vitals:   12/02/21 2037 12/02/21 2050  BP: 95/61   Pulse: (!) 112   Resp:    Temp:    SpO2: (!) 67% 90%    Intake/Output Summary (Last 24 hours) at 2021/12/23 0920 Last data filed at 12-23-2021 0600 Gross per 24 hour  Intake 0 ml  Output 250 ml  Net -250 ml   Last Weight  Most recent update: 11/25/2021  4:45 PM    Weight  45.4 kg (100 lb)            Gen: Frail cachectic elderly female in no acute distress HEENT: moist mucous membranes CV: Regular rate and rhythm PULM: On 2L nasal cannula ABD: soft/nontender EXT: Ecchymosis noted in bilateral lower extremities and upper extremities Neuro: Alert and oriented to self does not know where she is nor does she understand situation  SUMMARY OF RECOMMENDATIONS   DNAR/DNI   Comfort care   Comfort medications per MAR-we will add around-the-clock low-dose Dilaudid to address chronic pain   Plan to keep Savannah Coleman in-house as she appears to be in the active phases of the death and dying process   Ongoing palliative care support  Time Spent: 25 Greater than 50% of the time was spent in counseling and coordination of care ______________________________________________________________________________________ [place] Team Team Cell Phone: [phone] Please utilize secure chat with additional questions, if there is no response within 30 minutes please call the above phone number  Palliative Medicine Team providers are available by phone from 7am to 7pm daily  and can be reached through the team cell phone.  Should this patient require assistance outside of these hours, please call the patient's attending physician.

## 2021-12-18 NOTE — Progress Notes (Signed)
PROGRESS NOTE        PATIENT DETAILS Name: Savannah Coleman Age: 88 y.o. Sex: female Date of Birth: 03-Jun-1931 Admit Date: 11/27/2021 Admitting Physician Evalee Mutton Kristeen Mans, MD PCP:Pcp, No  Brief Narrative: Patient is a 88 y.o. female with history of frequent falls, chronic pain syndrome on narcotics, failure to thrive syndrome with worsening functional status-SNF resident-who presented with acute metabolic encephalopathy.  After discussion with family-patient was transitioned to full comfort measures.  See below for further details.  Subjective: Unresponsive-having apneic spells.  Objective: Vitals: Blood pressure 95/61, pulse (!) 112, temperature 98.3 F (36.8 C), temperature source Oral, resp. rate 18, height 5\' 3"  (1.6 m), weight 45.4 kg, SpO2 90 %.   Exam: Unresponsive-apneic spells.    Pertinent Labs/Radiology: Recent Labs  Lab 11/24/2021 1658  WBC 9.5  HGB 15.6*  PLT 312  NA 139  K 3.5  CREATININE 0.95  AST 32  ALT 19  ALKPHOS 121  BILITOT 1.3*     Assessment/Plan: Acute toxic metabolic encephalopathy: Unclear etiology-neuroimaging negative for structural abnormalities-no evidence of infection-no major electrolyte abnormalities.  Some concern that she could have had a seizure event at SNF-but family declined any further intervention.  Full comfort measures in effect.  Frequent falls resulting in numerous fractures-chronic pain syndrome: On scheduled narcotics-comfort care and affect  HTN: No role for antihypertensives-as comfort care in progress.  HLD: No role for statins as comfort care  GERD: On PPI  Mood disorder: On Zoloft  Palliative care/end-of-life care: DNR in place-full comfort measures in effect.  Actively dying-with apneic spells-suspect death is eminent in a matter of few hours/few days-do not think she is stable for transfer to SNF.  Will plan for inpatient death.  BMI Estimated body mass index is 17.71 kg/m as  calculated from the following:   Height as of this encounter: 5\' 3"  (1.6 m).   Weight as of this encounter: 45.4 kg.    Procedures: None Consults: Palliative care DVT Prophylaxis: Not needed-as comfort care Code Status DNR Family Communication: Son at bedside  Time spent: 49 minutes-Greater than 50% of this time was spent in counseling, explanation of diagnosis, planning of further management, and coordination of care.   Disposition Plan: Status is: Observation  The patient will require care spanning > 2 midnights and should be moved to inpatient because: Comfort care-actively dying currently.  See above.       Diet: Diet Order             Diet Heart Room service appropriate? Yes; Fluid consistency: Thin  Diet effective now                     Antimicrobial agents: Anti-infectives (From admission, onward)    None        MEDICATIONS: Scheduled Meds:   HYDROmorphone (DILAUDID) injection  1 mg Intravenous Q4H   Continuous Infusions:  chlorproMAZINE (THORAZINE) IV     PRN Meds:.acetaminophen, albuterol, chlorproMAZINE (THORAZINE) IV, HYDROmorphone (DILAUDID) injection, labetalol, LORazepam, ondansetron (ZOFRAN) IV   I have personally reviewed following labs and imaging studies  LABORATORY DATA: CBC: Recent Labs  Lab 11/20/2021 1658  WBC 9.5  NEUTROABS 6.4  HGB 15.6*  HCT 46.5*  MCV 96.1  PLT 312     Basic Metabolic Panel: Recent Labs  Lab 12/12/2021 1658  NA 139  K 3.5  CL 101  CO2 25  GLUCOSE 122*  BUN 15  CREATININE 0.95  CALCIUM 10.0     GFR: Estimated Creatinine Clearance: 29.3 mL/min (by C-G formula based on SCr of 0.95 mg/dL).  Liver Function Tests: Recent Labs  Lab 12/15/2021 1658  AST 32  ALT 19  ALKPHOS 121  BILITOT 1.3*  PROT 7.0  ALBUMIN 3.7    No results for input(s): LIPASE, AMYLASE in the last 168 hours. No results for input(s): AMMONIA in the last 168 hours.  Coagulation Profile: No results for input(s):  INR, PROTIME in the last 168 hours.  Cardiac Enzymes: No results for input(s): CKTOTAL, CKMB, CKMBINDEX, TROPONINI in the last 168 hours.  BNP (last 3 results) No results for input(s): PROBNP in the last 8760 hours.  Lipid Profile: No results for input(s): CHOL, HDL, LDLCALC, TRIG, CHOLHDL, LDLDIRECT in the last 72 hours.  Thyroid Function Tests: No results for input(s): TSH, T4TOTAL, FREET4, T3FREE, THYROIDAB in the last 72 hours.  Anemia Panel: No results for input(s): VITAMINB12, FOLATE, FERRITIN, TIBC, IRON, RETICCTPCT in the last 72 hours.  Urine analysis:    Component Value Date/Time   COLORURINE YELLOW 11/24/2021 [place] 11/28/2021 1659   LABSPEC 1.020 12/02/2021 1659   PHURINE 7.0 12/08/2021 1659   GLUCOSEU NEGATIVE 11/17/2021 1659   HGBUR SMALL (A) 12/06/2021 1659   BILIRUBINUR NEGATIVE 12/10/2021 1659   BILIRUBINUR small 01/17/2015 1139   KETONESUR 40 (A) 12/08/2021 1659   PROTEINUR NEGATIVE 12/14/2021 1659   UROBILINOGEN 1.0 01/17/2015 1139   UROBILINOGEN 0.2 05/02/2014 1652   NITRITE NEGATIVE 11/25/2021 1659   LEUKOCYTESUR TRACE (A) 11/17/2021 1659    Sepsis Labs: Lactic Acid, Venous    Component Value Date/Time   LATICACIDVEN 1.2 07/19/2021 2225    MICROBIOLOGY: Recent Results (from the past 240 hour(s))  Resp Panel by RT-PCR (Flu A&B, Covid) Nasopharyngeal Swab     Status: None   Collection Time: 12/17/2021  4:48 PM   Specimen: Nasopharyngeal Swab; Nasopharyngeal(NP) swabs in vial transport medium  Result Value Ref Range Status   SARS Coronavirus 2 by RT PCR NEGATIVE NEGATIVE Final    Comment: (NOTE) SARS-CoV-2 target nucleic acids are NOT DETECTED.  The SARS-CoV-2 RNA is generally detectable in upper respiratory specimens during the acute phase of infection. The lowest concentration of SARS-CoV-2 viral copies this assay can detect is 138 copies/mL. A negative result does not preclude SARS-Cov-2 infection and should not be used as  the sole basis for treatment or other patient management decisions. A negative result may occur with  improper specimen collection/handling, submission of specimen other than nasopharyngeal swab, presence of viral mutation(s) within the areas targeted by this assay, and inadequate number of viral copies(<138 copies/mL). A negative result must be combined with clinical observations, patient history, and epidemiological information. The expected result is Negative.  Fact Sheet for Patients:  EntrepreneurPulse.com.au  Fact Sheet for Healthcare Providers:  IncredibleEmployment.be  This test is no t yet approved or cleared by the [place] FDA and  has been authorized for detection and/or diagnosis of SARS-CoV-2 by FDA under an Emergency Use Authorization (EUA). This EUA will remain  in effect (meaning this test can be used) for the duration of the COVID-19 declaration under Section 564(b)(1) of the Act, 21 U.S.C.section 360bbb-3(b)(1), unless the authorization is terminated  or revoked sooner.       Influenza A by PCR NEGATIVE NEGATIVE Final   Influenza B by PCR NEGATIVE NEGATIVE Final  Comment: (NOTE) The Xpert Xpress SARS-CoV-2/FLU/RSV plus assay is intended as an aid in the diagnosis of influenza from Nasopharyngeal swab specimens and should not be used as a sole basis for treatment. Nasal washings and aspirates are unacceptable for Xpert Xpress SARS-CoV-2/FLU/RSV testing.  Fact Sheet for Patients: EntrepreneurPulse.com.au  Fact Sheet for Healthcare Providers: IncredibleEmployment.be  This test is not yet approved or cleared by the [place] FDA and has been authorized for detection and/or diagnosis of SARS-CoV-2 by FDA under an Emergency Use Authorization (EUA). This EUA will remain in effect (meaning this test can be used) for the duration of the COVID-19 declaration under Section 564(b)(1) of  the Act, 21 U.S.C. section 360bbb-3(b)(1), unless the authorization is terminated or revoked.  Performed at [facility], [place] [address] [place], [place] [address]     RADIOLOGY STUDIES/RESULTS: CT HEAD WO CONTRAST (5MM)  Result Date: 12/05/2021 CLINICAL DATA:  Seizure, new-onset, history of trauma. Altered mental status EXAM: CT HEAD WITHOUT CONTRAST TECHNIQUE: Contiguous axial images were obtained from the base of the skull through the vertex without intravenous contrast. COMPARISON:  03/27/2021 FINDINGS: Brain: There is atrophy and chronic small vessel disease changes. No acute intracranial abnormality. Specifically, no hemorrhage, hydrocephalus, mass lesion, acute infarction, or significant intracranial injury. Vascular: No hyperdense vessel or unexpected calcification. Skull: No acute calvarial abnormality. Sinuses/Orbits: No acute findings Other: None IMPRESSION: Atrophy, chronic microvascular disease. No acute intracranial abnormality. Electronically Signed   By: Rolm Baptise M.D.   On: 11/20/2021 17:50   CT Cervical Spine Wo Contrast  Result Date: 12/07/2021 CLINICAL DATA:  Neck trauma (Age >= 65y).  Possible seizure EXAM: CT CERVICAL SPINE WITHOUT CONTRAST TECHNIQUE: Multidetector CT imaging of the cervical spine was performed without intravenous contrast. Multiplanar CT image reconstructions were also generated. COMPARISON:  03/27/2021 FINDINGS: Alignment: Normal. Skull base and vertebrae: No acute fracture. No primary bone lesion or focal pathologic process. Soft tissues and spinal canal: No prevertebral fluid or swelling. No visible canal hematoma. Disc levels: Disc space narrowing and early spurring in the mid to lower cervical spine. Mild bilateral multilevel degenerative facet disease. Upper chest: Biapical scarring.  No acute findings Other: None IMPRESSION: Degenerative changes.  No acute bony abnormality. Electronically Signed   By: Rolm Baptise M.D.   On: 11/20/2021 17:53    MR BRAIN WO CONTRAST  Result Date: 11/30/2021 CLINICAL DATA:  Mental status change EXAM: MRI HEAD WITHOUT CONTRAST TECHNIQUE: Multiplanar, multiecho pulse sequences of the brain and surrounding structures were obtained without intravenous contrast. COMPARISON:  No prior MRI, correlation is made with 11/28/2021 CT head FINDINGS: Evaluation is significantly limited by motion artifact, the entire exam could not be completed. Within the aforementioned motion limitation, there is no definite restricted diffusion to suggest acute or subacute infarct. The remaining sequences are nondiagnostic. IMPRESSION: Evaluation is significantly limited by motion artifact and limited sequences obtained. Within this limitation, there is no definite restricted diffusion to suggest acute or subacute infarct. Electronically Signed   By: Merilyn Baba M.D.   On: 12/09/2021 23:39   DG Chest Portable 1 View  Result Date: 11/18/2021 CLINICAL DATA:  Altered level of consciousness, possible seizure, multiple falls EXAM: PORTABLE CHEST 1 VIEW COMPARISON:  07/19/2021 FINDINGS: Single frontal view of the chest demonstrates a stable cardiac silhouette. Continued ectasia of the thoracic aorta. Chronic interstitial scarring again noted, without acute airspace disease, effusion, or pneumothorax. Subacute right posterior fourth through sixth rib fractures with abundant callus formation, new since prior study.  Unremarkable left shoulder arthroplasty. IMPRESSION: 1. Subacute healing right posterior fourth through sixth rib fractures. 2. Chronic interstitial lung disease.  No acute process. Electronically Signed   By: Randa Ngo M.D.   On: 12/08/2021 17:25     LOS: 1 day   Oren Binet, MD  [facility]    To contact the attending provider between 7A-7P or the covering provider during after hours 7P-7A, please log into the web site [email or web address] and access using universal [place] password for that web site. If you do  not have the password, please call the hospital operator.  2022/01/01, 10:02 AM

## 2021-12-18 NOTE — Death Summary Note (Signed)
DEATH SUMMARY   Patient Details  Name: Savannah Coleman MRN: [id number] DOB: 03/14/1931  Admission/Discharge Information   Admit Date:  December 12, 2021  Date of Death: Date of Death: 12-14-21  Time of Death: Time of Death: 03-13-37  Length of Stay: 1  Referring Physician: Pcp, No   Reason(s) for Hospitalization   Altered mental status  Diagnoses  Preliminary cause of death:  Secondary Diagnoses (including complications and co-morbidities):  Principal Problem:   AMS (altered mental status) Active Problems:   Hypertension   Hyperlipidemia   GERD (gastroesophageal reflux disease)   Elevated troponin   Chronic pain   Encephalopathy acute   Brief Hospital Course (including significant findings, care, treatment, and services provided and events leading to death)   Brief Narrative:  Patient is a 88 y.o. female with history of frequent falls, chronic pain syndrome on narcotics, failure to thrive syndrome with worsening functional status-SNF resident-who presented with acute metabolic encephalopathy.  After discussion with family-patient was transitioned to full comfort measures.  Hospital course by problem list Acute toxic metabolic encephalopathy: Unclear etiology-neuroimaging negative for structural abnormalities-no evidence of infection-no major electrolyte abnormalities.  Some concern that she could have had a seizure event at SNF-but family declined any further intervention including EEG.  Family elected to pursue full comfort measures.  Eventually evaluated by palliative care-patient subsequently started on comfort measures and expired on December 15, 2023.   Frequent falls resulting in numerous fractures-chronic pain syndrome: On scheduled narcotics-as comfort measures were in effect   HTN: No role for antihypertensives-as comfort care was in progress.   HLD: No role for statins as comfort care   GERD: Was on PPI   Mood disorder: Was on Zoloft   Palliative care/end-of-life care: DNR in  place-after extensive discussion with the palliative care team and this MD-patient was transitioned to full comfort measures.  Patient rapidly declined-was deemed unstable for transfer to residential hospice.  She subsequently expired on Dec 15, 2023.   BMI Estimated body mass index is 17.71 kg/m as calculated from the following:   Height as of this encounter: 5\' 3"  (1.6 m).   Weight as of this encounter: 45.4 kg.     Pertinent Labs and Studies  Significant Diagnostic Studies CT HEAD WO CONTRAST (5MM)  Result Date: 2021-12-12 CLINICAL DATA:  Seizure, new-onset, history of trauma. Altered mental status EXAM: CT HEAD WITHOUT CONTRAST TECHNIQUE: Contiguous axial images were obtained from the base of the skull through the vertex without intravenous contrast. COMPARISON:  03/27/2021 FINDINGS: Brain: There is atrophy and chronic small vessel disease changes. No acute intracranial abnormality. Specifically, no hemorrhage, hydrocephalus, mass lesion, acute infarction, or significant intracranial injury. Vascular: No hyperdense vessel or unexpected calcification. Skull: No acute calvarial abnormality. Sinuses/Orbits: No acute findings Other: None IMPRESSION: Atrophy, chronic microvascular disease. No acute intracranial abnormality. Electronically Signed   By: Rolm Baptise M.D.   On: 2021-12-12 17:50   CT Cervical Spine Wo Contrast  Result Date: 12-12-2021 CLINICAL DATA:  Neck trauma (Age >= 65y).  Possible seizure EXAM: CT CERVICAL SPINE WITHOUT CONTRAST TECHNIQUE: Multidetector CT imaging of the cervical spine was performed without intravenous contrast. Multiplanar CT image reconstructions were also generated. COMPARISON:  03/27/2021 FINDINGS: Alignment: Normal. Skull base and vertebrae: No acute fracture. No primary bone lesion or focal pathologic process. Soft tissues and spinal canal: No prevertebral fluid or swelling. No visible canal hematoma. Disc levels: Disc space narrowing and early spurring in the mid  to lower cervical spine. Mild bilateral multilevel degenerative facet disease. Upper  chest: Biapical scarring.  No acute findings Other: None IMPRESSION: Degenerative changes.  No acute bony abnormality. Electronically Signed   By: Rolm Baptise M.D.   On: 12/02/2021 17:53   MR BRAIN WO CONTRAST  Result Date: 11/25/2021 CLINICAL DATA:  Mental status change EXAM: MRI HEAD WITHOUT CONTRAST TECHNIQUE: Multiplanar, multiecho pulse sequences of the brain and surrounding structures were obtained without intravenous contrast. COMPARISON:  No prior MRI, correlation is made with 11/24/2021 CT head FINDINGS: Evaluation is significantly limited by motion artifact, the entire exam could not be completed. Within the aforementioned motion limitation, there is no definite restricted diffusion to suggest acute or subacute infarct. The remaining sequences are nondiagnostic. IMPRESSION: Evaluation is significantly limited by motion artifact and limited sequences obtained. Within this limitation, there is no definite restricted diffusion to suggest acute or subacute infarct. Electronically Signed   By: Merilyn Baba M.D.   On: 11/19/2021 23:39   DG Chest Portable 1 View  Result Date: 12/10/2021 CLINICAL DATA:  Altered level of consciousness, possible seizure, multiple falls EXAM: PORTABLE CHEST 1 VIEW COMPARISON:  07/19/2021 FINDINGS: Single frontal view of the chest demonstrates a stable cardiac silhouette. Continued ectasia of the thoracic aorta. Chronic interstitial scarring again noted, without acute airspace disease, effusion, or pneumothorax. Subacute right posterior fourth through sixth rib fractures with abundant callus formation, new since prior study. Unremarkable left shoulder arthroplasty. IMPRESSION: 1. Subacute healing right posterior fourth through sixth rib fractures. 2. Chronic interstitial lung disease.  No acute process. Electronically Signed   By: Randa Ngo M.D.   On: 12/02/2021 17:25     Microbiology Recent Results (from the past 240 hour(s))  Resp Panel by RT-PCR (Flu A&B, Covid) Nasopharyngeal Swab     Status: None   Collection Time: 12/08/2021  4:48 PM   Specimen: Nasopharyngeal Swab; Nasopharyngeal(NP) swabs in vial transport medium  Result Value Ref Range Status   SARS Coronavirus 2 by RT PCR NEGATIVE NEGATIVE Final    Comment: (NOTE) SARS-CoV-2 target nucleic acids are NOT DETECTED.  The SARS-CoV-2 RNA is generally detectable in upper respiratory specimens during the acute phase of infection. The lowest concentration of SARS-CoV-2 viral copies this assay can detect is 138 copies/mL. A negative result does not preclude SARS-Cov-2 infection and should not be used as the sole basis for treatment or other patient management decisions. A negative result may occur with  improper specimen collection/handling, submission of specimen other than nasopharyngeal swab, presence of viral mutation(s) within the areas targeted by this assay, and inadequate number of viral copies(<138 copies/mL). A negative result must be combined with clinical observations, patient history, and epidemiological information. The expected result is Negative.  Fact Sheet for Patients:  EntrepreneurPulse.com.au  Fact Sheet for Healthcare Providers:  IncredibleEmployment.be  This test is no t yet approved or cleared by the [place] FDA and  has been authorized for detection and/or diagnosis of SARS-CoV-2 by FDA under an Emergency Use Authorization (EUA). This EUA will remain  in effect (meaning this test can be used) for the duration of the COVID-19 declaration under Section 564(b)(1) of the Act, 21 U.S.C.section 360bbb-3(b)(1), unless the authorization is terminated  or revoked sooner.       Influenza A by PCR NEGATIVE NEGATIVE Final   Influenza B by PCR NEGATIVE NEGATIVE Final    Comment: (NOTE) The Xpert Xpress SARS-CoV-2/FLU/RSV plus assay is  intended as an aid in the diagnosis of influenza from Nasopharyngeal swab specimens and should not be used as a sole  basis for treatment. Nasal washings and aspirates are unacceptable for Xpert Xpress SARS-CoV-2/FLU/RSV testing.  Fact Sheet for Patients: EntrepreneurPulse.com.au  Fact Sheet for Healthcare Providers: IncredibleEmployment.be  This test is not yet approved or cleared by the [place] FDA and has been authorized for detection and/or diagnosis of SARS-CoV-2 by FDA under an Emergency Use Authorization (EUA). This EUA will remain in effect (meaning this test can be used) for the duration of the COVID-19 declaration under Section 564(b)(1) of the Act, 21 U.S.C. section 360bbb-3(b)(1), unless the authorization is terminated or revoked.  Performed at [facility], [place] [address], [place], [place] [address]     Lab Basic Metabolic Panel: Recent Labs  Lab 12/12/2021 1658  NA 139  K 3.5  CL 101  CO2 25  GLUCOSE 122*  BUN 15  CREATININE 0.95  CALCIUM 10.0   Liver Function Tests: Recent Labs  Lab 11/23/2021 1658  AST 32  ALT 19  ALKPHOS 121  BILITOT 1.3*  PROT 7.0  ALBUMIN 3.7   No results for input(s): LIPASE, AMYLASE in the last 168 hours. No results for input(s): AMMONIA in the last 168 hours. CBC: Recent Labs  Lab 11/20/2021 1658  WBC 9.5  NEUTROABS 6.4  HGB 15.6*  HCT 46.5*  MCV 96.1  PLT 312   Cardiac Enzymes: No results for input(s): CKTOTAL, CKMB, CKMBINDEX, TROPONINI in the last 168 hours. Sepsis Labs: Recent Labs  Lab 11/18/2021 1658  WBC 9.5    Procedures/Operations     Kaedynce Tapp 12/07/2021, 3:29 PM

## 2021-12-18 NOTE — TOC Progression Note (Signed)
Transition of Care [facility]) - Progression Note    Patient Details  Name: Savannah Coleman MRN: [id number] Date of Birth: 1931/10/14  Transition of Care [facility]) CM/SW [place], [place] Phone Number: 2021/12/08, 12:58 PM  Clinical Narrative:    Pt is comfort care will be a hospital death.  Expected Discharge Plan: [place] Barriers to Discharge: Continued Medical Work up  Expected Discharge Plan and Services Expected Discharge Plan: [employer]   Discharge Planning Services: CM Consult Post Acute Care Choice: [place] Living arrangements for the past 2 months: [place]                 DME Arranged: N/A DME Agency: NA         HH Agency: NA         Social Determinants of Health (SDOH) Interventions    Readmission Risk Interventions No flowsheet data found.

## 2021-12-18 DEATH — deceased

## 2023-06-05 IMAGING — CT CT HEAD W/O CM
2 series · 14 of 30 positions shown, 16 images · non-contrast
Comparison: 03/27/2021

CLINICAL DATA: Seizure, new-onset, history of trauma. Altered
mental status

EXAM:
CT HEAD WITHOUT CONTRAST
TECHNIQUE: Contiguous axial images were obtained from the base of the skull
through the vertex without intravenous contrast.

[Series 3: head wo · axial · 0.34mm/px · z∈[+1298,+1417]mm · 7 of 32 slices shown, 9 images]
[im 4/32  brain]
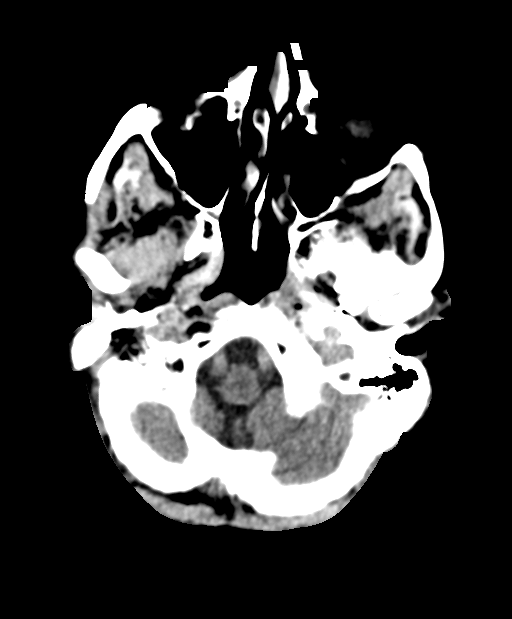
[im 4/32  bone]
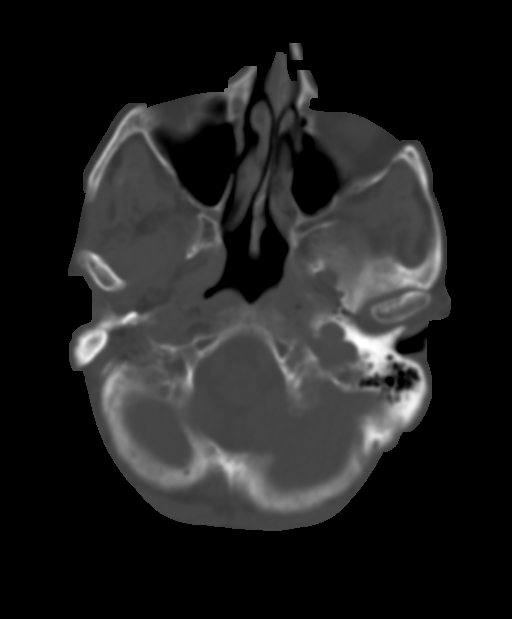
[im 8/32  brain]
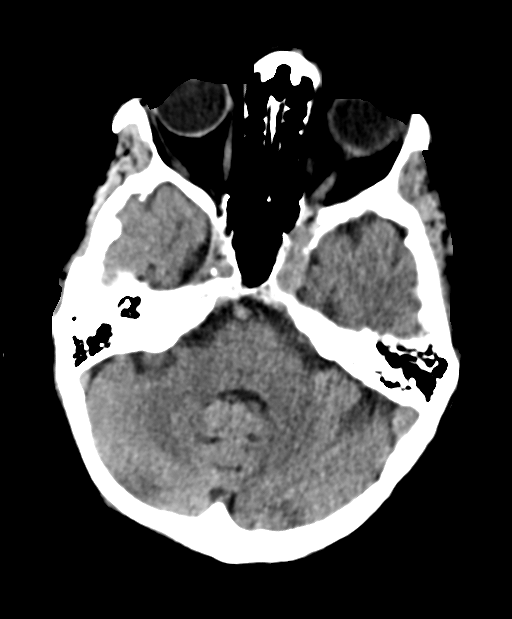
[im 12/32  brain]
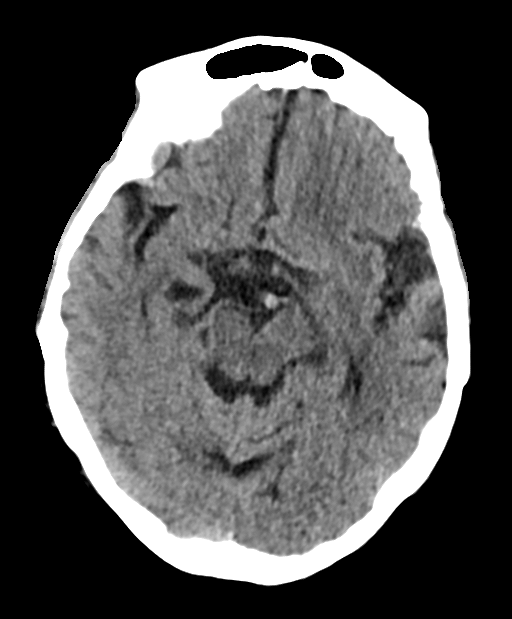
[im 16/32  brain]
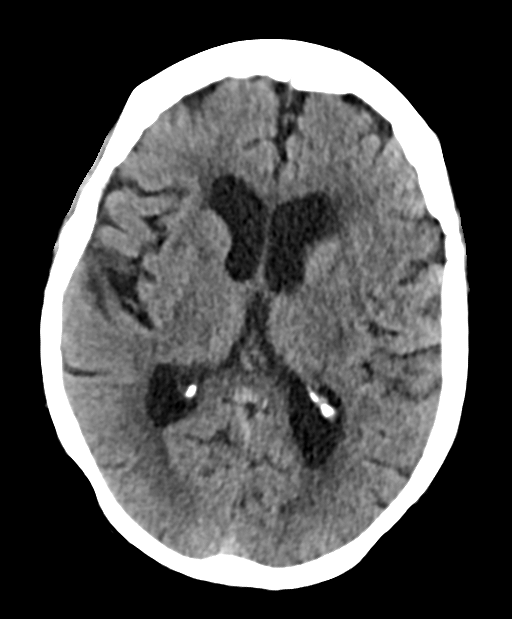
[im 20/32  brain]
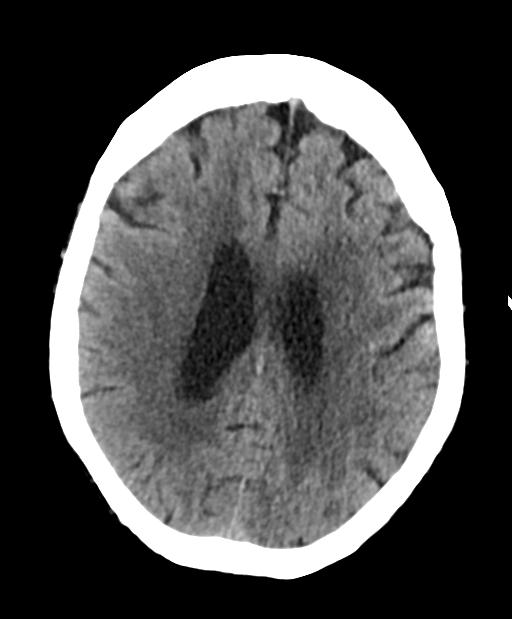
[im 20/32  bone]
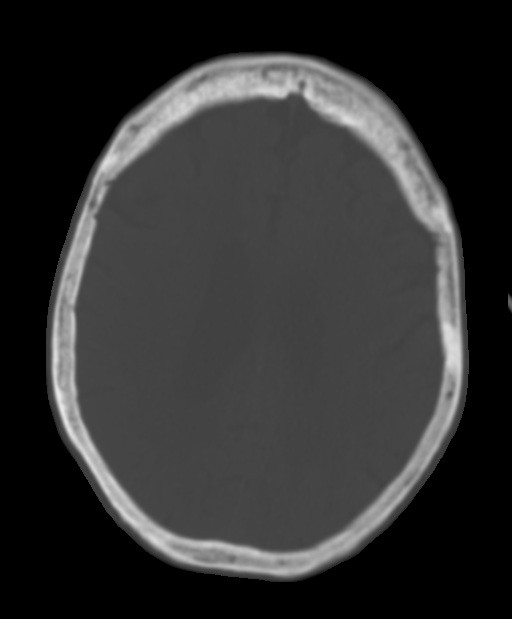
[im 24/32  brain]
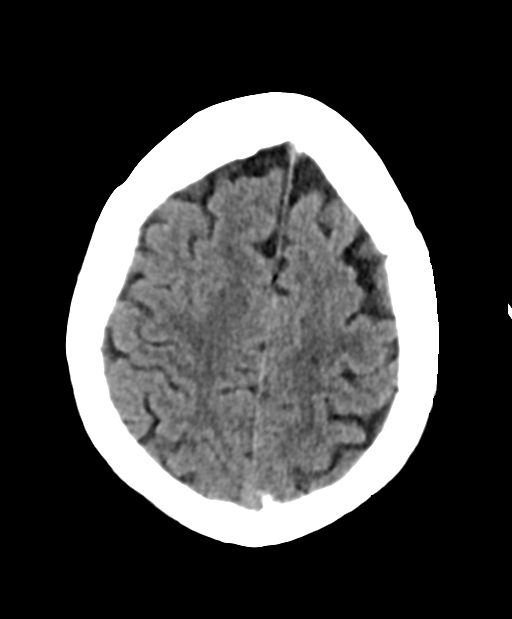
[im 28/32  brain]
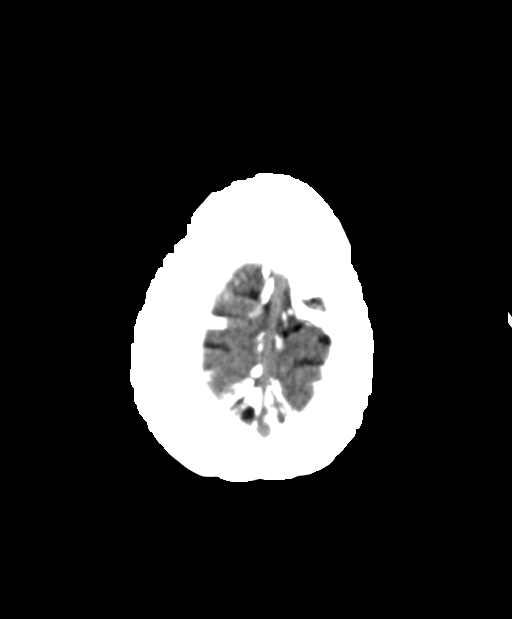

[Series 4: head bone · axial · 0.34mm/px · z∈[+1294,+1405]mm · 7 of 80 slices shown]
[im 8/80  bone]
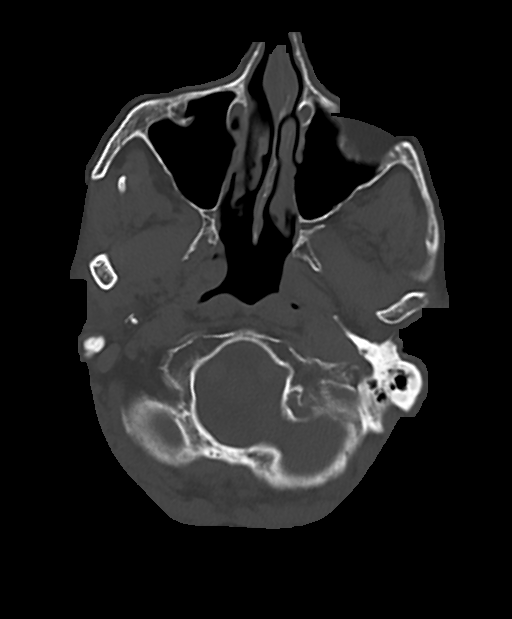
[im 16/80  bone]
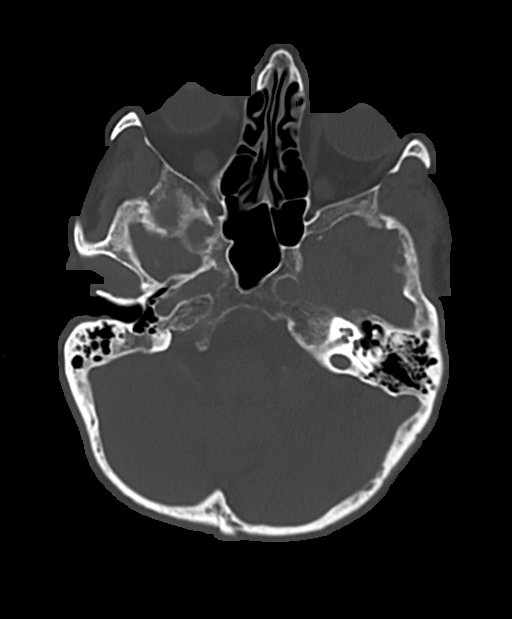
[im 24/80  bone]
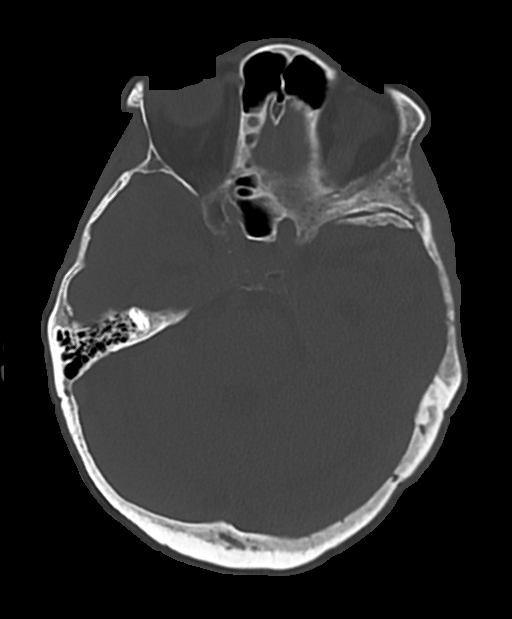
[im 36/80  bone]
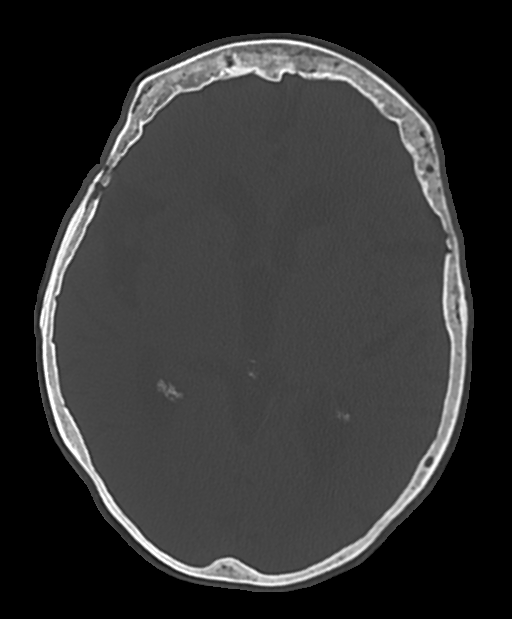
[im 44/80  bone]
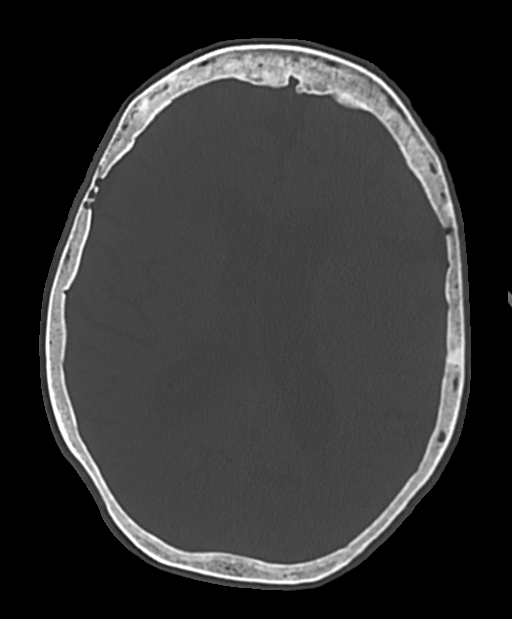
[im 56/80  bone]
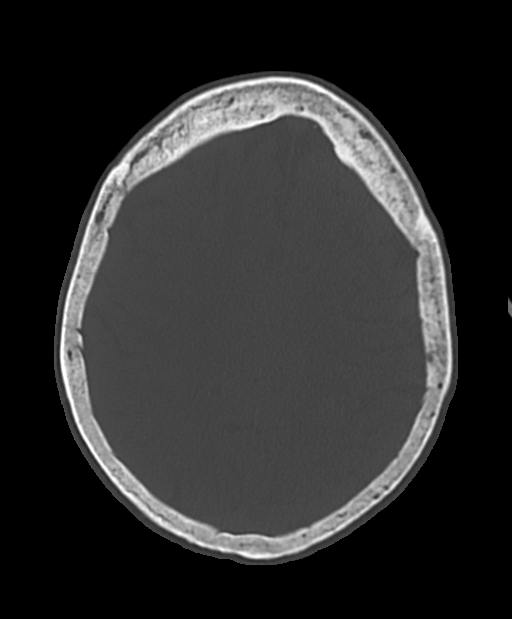
[im 64/80  bone]
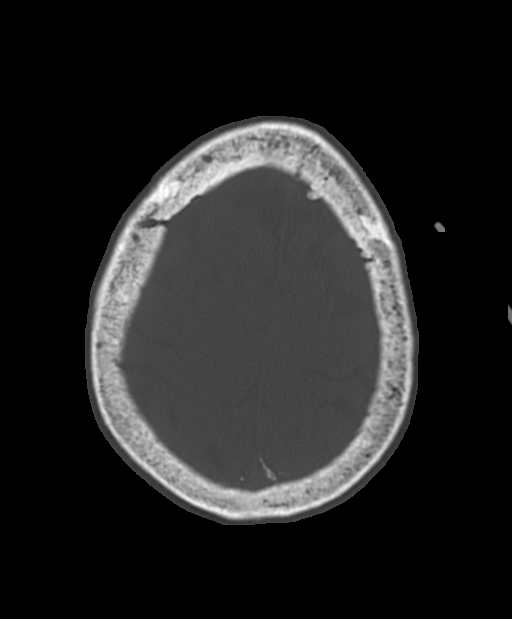

[14 of 30 positions shown; findings below may reference images not displayed]

FINDINGS: Brain: There is atrophy and chronic small vessel disease changes. No
acute intracranial abnormality. Specifically, no hemorrhage,
hydrocephalus, mass lesion, acute infarction, or significant
intracranial injury.

Vascular: No hyperdense vessel or unexpected calcification.

Skull: No acute calvarial abnormality.

Sinuses/Orbits: No acute findings

Other: None
IMPRESSION: Atrophy, chronic microvascular disease.

No acute intracranial abnormality.
